# Patient Record
Sex: Female | Born: 1950 | Race: White | Hispanic: No | Marital: Married | Smoking: Never smoker
Health system: Southern US, Community
[De-identification: ages and names within clinical notes are randomized; demographics above are authoritative.]

## PROBLEM LIST (undated history)

## (undated) DIAGNOSIS — I5189 Other ill-defined heart diseases: Secondary | ICD-10-CM

## (undated) DIAGNOSIS — K209 Esophagitis, unspecified without bleeding: Secondary | ICD-10-CM

## (undated) DIAGNOSIS — M5416 Radiculopathy, lumbar region: Secondary | ICD-10-CM

## (undated) DIAGNOSIS — K449 Diaphragmatic hernia without obstruction or gangrene: Secondary | ICD-10-CM

## (undated) DIAGNOSIS — D649 Anemia, unspecified: Secondary | ICD-10-CM

## (undated) DIAGNOSIS — I447 Left bundle-branch block, unspecified: Secondary | ICD-10-CM

## (undated) DIAGNOSIS — I38 Endocarditis, valve unspecified: Secondary | ICD-10-CM

## (undated) DIAGNOSIS — N184 Chronic kidney disease, stage 4 (severe): Secondary | ICD-10-CM

## (undated) DIAGNOSIS — M8580 Other specified disorders of bone density and structure, unspecified site: Secondary | ICD-10-CM

## (undated) DIAGNOSIS — N189 Chronic kidney disease, unspecified: Secondary | ICD-10-CM

## (undated) DIAGNOSIS — I2510 Atherosclerotic heart disease of native coronary artery without angina pectoris: Secondary | ICD-10-CM

## (undated) DIAGNOSIS — I2721 Secondary pulmonary arterial hypertension: Secondary | ICD-10-CM

## (undated) DIAGNOSIS — M1990 Unspecified osteoarthritis, unspecified site: Secondary | ICD-10-CM

## (undated) DIAGNOSIS — U071 COVID-19: Secondary | ICD-10-CM

## (undated) DIAGNOSIS — J189 Pneumonia, unspecified organism: Secondary | ICD-10-CM

## (undated) DIAGNOSIS — I509 Heart failure, unspecified: Secondary | ICD-10-CM

## (undated) DIAGNOSIS — E039 Hypothyroidism, unspecified: Secondary | ICD-10-CM

## (undated) DIAGNOSIS — I10 Essential (primary) hypertension: Secondary | ICD-10-CM

## (undated) DIAGNOSIS — I5032 Chronic diastolic (congestive) heart failure: Secondary | ICD-10-CM

## (undated) DIAGNOSIS — E119 Type 2 diabetes mellitus without complications: Secondary | ICD-10-CM

## (undated) DIAGNOSIS — K5790 Diverticulosis of intestine, part unspecified, without perforation or abscess without bleeding: Secondary | ICD-10-CM

## (undated) DIAGNOSIS — K219 Gastro-esophageal reflux disease without esophagitis: Secondary | ICD-10-CM

## (undated) DIAGNOSIS — E7800 Pure hypercholesterolemia, unspecified: Secondary | ICD-10-CM

## (undated) DIAGNOSIS — E079 Disorder of thyroid, unspecified: Secondary | ICD-10-CM

## (undated) DIAGNOSIS — E669 Obesity, unspecified: Secondary | ICD-10-CM

## (undated) DIAGNOSIS — I4891 Unspecified atrial fibrillation: Secondary | ICD-10-CM

## (undated) HISTORY — PX: OTHER SURGICAL HISTORY: SHX169

## (undated) HISTORY — PX: CATARACT EXTRACTION: SUR2

## (undated) HISTORY — DX: Diaphragmatic hernia without obstruction or gangrene: K44.9

## (undated) HISTORY — DX: Diverticulosis of intestine, part unspecified, without perforation or abscess without bleeding: K57.90

## (undated) HISTORY — DX: Other specified disorders of bone density and structure, unspecified site: M85.80

## (undated) HISTORY — DX: Unspecified osteoarthritis, unspecified site: M19.90

## (undated) HISTORY — DX: Hypercalcemia: E83.52

## (undated) HISTORY — DX: Esophagitis, unspecified: K20.9

## (undated) HISTORY — DX: Heart failure, unspecified: I50.9

## (undated) HISTORY — DX: Obesity, unspecified: E66.9

## (undated) HISTORY — PX: EYE SURGERY: SHX253

## (undated) HISTORY — DX: Esophagitis, unspecified without bleeding: K20.90

## (undated) HISTORY — DX: Radiculopathy, lumbar region: M54.16

---
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
## 2004-09-15 ENCOUNTER — Ambulatory Visit: Payer: Self-pay | Admitting: Internal Medicine

## 2004-12-28 ENCOUNTER — Ambulatory Visit: Payer: Self-pay | Admitting: Cardiovascular Disease

## 2006-12-05 ENCOUNTER — Inpatient Hospital Stay: Payer: Self-pay | Admitting: Cardiovascular Disease

## 2006-12-05 ENCOUNTER — Other Ambulatory Visit: Payer: Self-pay

## 2006-12-05 HISTORY — PX: CORONARY ANGIOPLASTY WITH STENT PLACEMENT: SHX49

## 2009-08-24 ENCOUNTER — Ambulatory Visit: Payer: Self-pay | Admitting: Family Medicine

## 2010-11-07 ENCOUNTER — Ambulatory Visit: Payer: Self-pay | Admitting: Cardiovascular Disease

## 2010-12-13 ENCOUNTER — Inpatient Hospital Stay: Payer: Self-pay | Admitting: Student

## 2012-09-18 ENCOUNTER — Ambulatory Visit: Payer: Self-pay

## 2012-09-29 ENCOUNTER — Other Ambulatory Visit: Payer: Self-pay | Admitting: Urgent Care

## 2012-09-29 LAB — IRON: Iron: 52 ug/dL (ref 50–170)

## 2012-09-29 LAB — FERRITIN: Ferritin (ARMC): 8 ng/mL (ref 8–388)

## 2012-09-29 LAB — FOLATE: Folic Acid: 18.4 ng/mL (ref 3.1–100.0)

## 2012-10-01 ENCOUNTER — Ambulatory Visit: Payer: Self-pay

## 2012-10-20 ENCOUNTER — Ambulatory Visit: Payer: Self-pay | Admitting: Gastroenterology

## 2012-10-21 LAB — PATHOLOGY REPORT

## 2014-07-26 ENCOUNTER — Other Ambulatory Visit: Payer: Self-pay | Admitting: Unknown Physician Specialty

## 2014-08-07 ENCOUNTER — Emergency Department: Payer: PRIVATE HEALTH INSURANCE

## 2014-08-07 ENCOUNTER — Encounter: Payer: Self-pay | Admitting: *Deleted

## 2014-08-07 ENCOUNTER — Inpatient Hospital Stay
Admission: EM | Admit: 2014-08-07 | Discharge: 2014-08-10 | DRG: 293 | Disposition: A | Discharge status: Home / Self Care | Payer: PRIVATE HEALTH INSURANCE | Attending: Specialist | Admitting: Specialist

## 2014-08-07 DIAGNOSIS — E039 Hypothyroidism, unspecified: Secondary | ICD-10-CM | POA: Diagnosis present

## 2014-08-07 DIAGNOSIS — E785 Hyperlipidemia, unspecified: Secondary | ICD-10-CM | POA: Diagnosis present

## 2014-08-07 DIAGNOSIS — Z79810 Long term (current) use of selective estrogen receptor modulators (SERMs): Secondary | ICD-10-CM | POA: Diagnosis not present

## 2014-08-07 DIAGNOSIS — Z79899 Other long term (current) drug therapy: Secondary | ICD-10-CM | POA: Diagnosis not present

## 2014-08-07 DIAGNOSIS — R0902 Hypoxemia: Secondary | ICD-10-CM | POA: Diagnosis present

## 2014-08-07 DIAGNOSIS — I2510 Atherosclerotic heart disease of native coronary artery without angina pectoris: Secondary | ICD-10-CM | POA: Diagnosis present

## 2014-08-07 DIAGNOSIS — Z882 Allergy status to sulfonamides status: Secondary | ICD-10-CM

## 2014-08-07 DIAGNOSIS — Z955 Presence of coronary angioplasty implant and graft: Secondary | ICD-10-CM

## 2014-08-07 DIAGNOSIS — Z7982 Long term (current) use of aspirin: Secondary | ICD-10-CM | POA: Diagnosis not present

## 2014-08-07 DIAGNOSIS — E119 Type 2 diabetes mellitus without complications: Secondary | ICD-10-CM | POA: Diagnosis present

## 2014-08-07 DIAGNOSIS — D649 Anemia, unspecified: Secondary | ICD-10-CM | POA: Diagnosis present

## 2014-08-07 DIAGNOSIS — I10 Essential (primary) hypertension: Secondary | ICD-10-CM | POA: Diagnosis not present

## 2014-08-07 DIAGNOSIS — I5033 Acute on chronic diastolic (congestive) heart failure: Secondary | ICD-10-CM | POA: Diagnosis not present

## 2014-08-07 DIAGNOSIS — Z7902 Long term (current) use of antithrombotics/antiplatelets: Secondary | ICD-10-CM | POA: Diagnosis not present

## 2014-08-07 DIAGNOSIS — I509 Heart failure, unspecified: Secondary | ICD-10-CM | POA: Diagnosis not present

## 2014-08-07 DIAGNOSIS — K219 Gastro-esophageal reflux disease without esophagitis: Secondary | ICD-10-CM | POA: Diagnosis present

## 2014-08-07 DIAGNOSIS — N289 Disorder of kidney and ureter, unspecified: Secondary | ICD-10-CM | POA: Diagnosis present

## 2014-08-07 HISTORY — DX: Atherosclerotic heart disease of native coronary artery without angina pectoris: I25.10

## 2014-08-07 HISTORY — DX: Type 2 diabetes mellitus without complications: E11.9

## 2014-08-07 HISTORY — DX: Pure hypercholesterolemia, unspecified: E78.00

## 2014-08-07 HISTORY — DX: Gastro-esophageal reflux disease without esophagitis: K21.9

## 2014-08-07 HISTORY — DX: Essential (primary) hypertension: I10

## 2014-08-07 HISTORY — DX: Disorder of thyroid, unspecified: E07.9

## 2014-08-07 LAB — BASIC METABOLIC PANEL
Anion gap: 7 (ref 5–15)
BUN: 13 mg/dL (ref 6–20)
CO2: 25 mmol/L (ref 22–32)
Calcium: 8.5 mg/dL — ABNORMAL LOW (ref 8.9–10.3)
Chloride: 92 mmol/L — ABNORMAL LOW (ref 101–111)
Creatinine, Ser: 0.8 mg/dL (ref 0.44–1.00)
GFR calc non Af Amer: >60 mL/min (ref >60)
Glucose, Bld: 143 mg/dL — ABNORMAL HIGH (ref 65–99)
Potassium: 4 mmol/L (ref 3.5–5.1)
Sodium: 124 mmol/L — ABNORMAL LOW (ref 135–145)

## 2014-08-07 LAB — CBC
HCT: 27.8 % — ABNORMAL LOW (ref 35.0–47.0)
HEMOGLOBIN: 9.1 g/dL — AB (ref 12.0–16.0)
MCH: 28.2 pg (ref 26.0–34.0)
MCHC: 32.7 g/dL (ref 32.0–36.0)
MCV: 86.2 fL (ref 80.0–100.0)
PLATELETS: 250 10*3/uL (ref 150–440)
RBC: 3.23 MIL/uL — ABNORMAL LOW (ref 3.80–5.20)
RDW: 18.9 % — AB (ref 11.5–14.5)
WBC: 6.5 10*3/uL (ref 3.6–11.0)

## 2014-08-07 LAB — GLUCOSE, CAPILLARY: GLUCOSE-CAPILLARY: 156 mg/dL — AB (ref 65–99)

## 2014-08-07 LAB — BRAIN NATRIURETIC PEPTIDE: B NATRIURETIC PEPTIDE 5: 516 pg/mL — AB (ref 0.0–100.0)

## 2014-08-07 LAB — TROPONIN I: Troponin I: <0.03 ng/mL (ref <0.031)

## 2014-08-07 MED ORDER — ACETAMINOPHEN 650 MG RE SUPP: 650.0000 mg | Freq: Four times a day (QID) | RECTAL | Status: DC | PRN

## 2014-08-07 MED ORDER — ATORVASTATIN CALCIUM 20 MG PO TABS
20.0000 mg | ORAL_TABLET | Freq: Every day | ORAL | Status: DC
  Administered 2014-08-08 – 2014-08-10 (×3): 20 mg via ORAL
  Filled 2014-08-07 (×3): qty 1

## 2014-08-07 MED ORDER — AMLODIPINE BESYLATE 5 MG PO TABS
5.0000 mg | ORAL_TABLET | Freq: Every day | ORAL | Status: DC
  Filled 2014-08-07: qty 1

## 2014-08-07 MED ORDER — IRBESARTAN 150 MG PO TABS
300.0000 mg | ORAL_TABLET | Freq: Every day | ORAL | Status: DC
  Administered 2014-08-08 – 2014-08-10 (×3): 300 mg via ORAL
  Filled 2014-08-07 (×3): qty 2

## 2014-08-07 MED ORDER — ONDANSETRON HCL 4 MG/2ML IJ SOLN: 4.0000 mg | Freq: Four times a day (QID) | INTRAMUSCULAR | Status: DC | PRN

## 2014-08-07 MED ORDER — SODIUM CHLORIDE 0.9 % IJ SOLN
3.0000 mL | Freq: Two times a day (BID) | INTRAMUSCULAR | Status: DC
  Administered 2014-08-08 – 2014-08-10 (×6): 3 mL via INTRAVENOUS

## 2014-08-07 MED ORDER — ONDANSETRON HCL 4 MG PO TABS: 4.0000 mg | ORAL_TABLET | Freq: Four times a day (QID) | ORAL | Status: DC | PRN

## 2014-08-07 MED ORDER — NITROGLYCERIN 2 % TD OINT
TOPICAL_OINTMENT | TRANSDERMAL | Status: AC
  Filled 2014-08-07: qty 1

## 2014-08-07 MED ORDER — ASPIRIN EC 81 MG PO TBEC
81.0000 mg | DELAYED_RELEASE_TABLET | Freq: Every day | ORAL | Status: DC
  Administered 2014-08-08 – 2014-08-10 (×3): 81 mg via ORAL
  Filled 2014-08-07 (×3): qty 1

## 2014-08-07 MED ORDER — HEPARIN SODIUM (PORCINE) 5000 UNIT/ML IJ SOLN
5000.0000 [IU] | Freq: Three times a day (TID) | INTRAMUSCULAR | Status: DC
  Administered 2014-08-08 – 2014-08-10 (×9): 5000 [IU] via SUBCUTANEOUS
  Filled 2014-08-07 (×9): qty 1

## 2014-08-07 MED ORDER — LEVOTHYROXINE SODIUM 50 MCG PO TABS
100.0000 ug | ORAL_TABLET | Freq: Every day | ORAL | Status: DC
  Administered 2014-08-08 – 2014-08-09 (×2): 100 ug via ORAL
  Filled 2014-08-07 (×2): qty 2

## 2014-08-07 MED ORDER — FUROSEMIDE 10 MG/ML IJ SOLN
40.0000 mg | Freq: Every day | INTRAMUSCULAR | Status: DC
  Filled 2014-08-07: qty 4

## 2014-08-07 MED ORDER — RALOXIFENE HCL 60 MG PO TABS
60.0000 mg | ORAL_TABLET | Freq: Every day | ORAL | Status: DC
  Administered 2014-08-08 – 2014-08-10 (×3): 60 mg via ORAL
  Filled 2014-08-07 (×3): qty 1

## 2014-08-07 MED ORDER — PANTOPRAZOLE SODIUM 40 MG PO TBEC
40.0000 mg | DELAYED_RELEASE_TABLET | Freq: Every day | ORAL | Status: DC
  Administered 2014-08-08 – 2014-08-10 (×3): 40 mg via ORAL
  Filled 2014-08-07 (×3): qty 1

## 2014-08-07 MED ORDER — CLOPIDOGREL BISULFATE 75 MG PO TABS
75.0000 mg | ORAL_TABLET | Freq: Every day | ORAL | Status: DC
  Administered 2014-08-08 – 2014-08-10 (×3): 75 mg via ORAL
  Filled 2014-08-07 (×3): qty 1

## 2014-08-07 MED ORDER — FUROSEMIDE 10 MG/ML IJ SOLN
INTRAMUSCULAR | Status: AC
  Filled 2014-08-07: qty 4

## 2014-08-07 MED ORDER — ACETAMINOPHEN 325 MG PO TABS: 650.0000 mg | ORAL_TABLET | Freq: Four times a day (QID) | ORAL | Status: DC | PRN

## 2014-08-07 MED ORDER — NITROGLYCERIN 2 % TD OINT
1.0000 [in_us] | TOPICAL_OINTMENT | Freq: Four times a day (QID) | TRANSDERMAL | Status: DC
  Administered 2014-08-07 – 2014-08-10 (×10): 1 [in_us] via TOPICAL
  Filled 2014-08-07 (×10): qty 1

## 2014-08-07 MED ORDER — MORPHINE SULFATE 2 MG/ML IJ SOLN: 2.0000 mg | INTRAMUSCULAR | Status: DC | PRN

## 2014-08-07 MED ORDER — IPRATROPIUM-ALBUTEROL 0.5-2.5 (3) MG/3ML IN SOLN
3.0000 mL | RESPIRATORY_TRACT | Status: DC
  Administered 2014-08-08 – 2014-08-10 (×12): 3 mL via RESPIRATORY_TRACT
  Filled 2014-08-07 (×12): qty 3

## 2014-08-07 MED ORDER — METOPROLOL SUCCINATE ER 100 MG PO TB24
100.0000 mg | ORAL_TABLET | Freq: Every day | ORAL | Status: DC
  Administered 2014-08-08 – 2014-08-10 (×3): 100 mg via ORAL
  Filled 2014-08-07 (×3): qty 1

## 2014-08-07 MED ORDER — FUROSEMIDE 10 MG/ML IJ SOLN
40.0000 mg | INTRAMUSCULAR | Status: AC
  Administered 2014-08-07: 40 mg via INTRAVENOUS

## 2014-08-07 MED ORDER — INSULIN ASPART 100 UNIT/ML ~~LOC~~ SOLN: 0.0000 [IU] | Freq: Three times a day (TID) | SUBCUTANEOUS | Status: DC

## 2014-08-07 MED ORDER — INSULIN ASPART 100 UNIT/ML ~~LOC~~ SOLN
0.0000 [IU] | Freq: Every day | SUBCUTANEOUS | Status: DC
  Filled 2014-08-07: qty 2

## 2014-08-07 NOTE — ED Notes (Signed)
Pt with improved resp rate, pt states "i feel a little better". Oxygen continues to infuse at 2lpm via [place], cardiac monitor remains in place. Call bell at right side. Bedside commode placed next to pt due to lasix administration.

## 2014-08-07 NOTE — ED Notes (Signed)
Pt with ra pox of 84% and resps of 30 with excertion after arriving in room from triage. Pt placed on oxygen at 2lpm via [place] with rebound of pox to 92%. Pt with improved tachypnea after oxygen placed.

## 2014-08-07 NOTE — ED Provider Notes (Signed)
[facility] Emergency Department Provider Note  ____________________________________________  Time seen: Approximately 8:45 PM  I have reviewed the triage vital signs and the nursing notes.   HISTORY  Chief Complaint Shortness of Breath    HPI Erica Larsen is a 64 y.o. female presents with shortness of breath increasing for about one week. This is associated with increased swelling in her legs. Chills reports her doctor recently took her off her fluid pill.  Patient does have a history of coronary disease including a stent. She takes aspirin Plavix for this. She denies chest pain. She denies any fevers or chills. She has not of a productive cough but has had a chronic cough for months.  Patient developed shortness of breath, worsening with exertion, is worse when she lays down, and today is notable even at rest.  Past Medical History  Diagnosis Date  . Hypertension   . Diabetes mellitus without complication   . High cholesterol   . Thyroid disease   . GERD (gastroesophageal reflux disease)   . Coronary artery disease     Patient Active Problem List   Diagnosis Date Noted  . Acute exacerbation of CHF (congestive heart failure) 08/07/2014    Past Surgical History  Procedure Laterality Date  . Coronary angioplasty with stent placement      No current outpatient prescriptions on file.  Allergies Latex and Sulfa antibiotics  Family History  Problem Relation Age of Onset  . Heart failure Neg Hx     Social History History  Substance Use Topics  . Smoking status: Never Smoker   . Smokeless tobacco: Not on file  . Alcohol Use: No    Review of Systems Constitutional: No fever/chills Eyes: No visual changes. ENT: No sore throat. Cardiovascular: Denies chest pain. Respiratory: See history of present illness Gastrointestinal: No abdominal pain.  No nausea, no vomiting.  No diarrhea.  No constipation. Genitourinary: Negative for  dysuria. Musculoskeletal: Negative for back pain. Skin: Negative for rash. Neurological: Negative for headaches, focal weakness or numbness.  10-point ROS otherwise negative.  ____________________________________________   PHYSICAL EXAM:  VITAL SIGNS: ED Triage Vitals  Enc Vitals Group     BP 08/07/14 1838 147/106 mmHg     Pulse Rate 08/07/14 1838 73     Resp 08/07/14 1838 26     Temp 08/07/14 1838 98.1 F (36.7 C)     Temp Source 08/07/14 1838 Oral     SpO2 08/07/14 1838 92 %     Weight --      Height 08/07/14 1838 5\' 2"  (1.575 m)     Head Cir --      Peak Flow --      Pain Score --      Pain Loc --      Pain Edu? --      Excl. in [place]? --     Constitutional: Alert and oriented. Mild increased work of breathing. Oxygen 99% on 2 L. Eyes: Conjunctivae are normal. PERRL. EOMI. Head: Atraumatic. Nose: No congestion/rhinnorhea. Mouth/Throat: Mucous membranes are moist.  Oropharynx non-erythematous. Neck: No stridor.   Cardiovascular: Normal rate, regular rhythm. Grossly normal heart sounds.  Good peripheral circulation. Respiratory: Normal respiratory effort.  No retractions. Diminished lung sounds at the bases. Question if there may be some slight crackles in the bases. Aches in short phrases. Mild use of accessory muscles. Gastrointestinal: Soft and nontender. No distention. No abdominal bruits. No CVA tenderness. Musculoskeletal: 2+ pitting edema bilaterally lower extremities. Neurologic:  Normal speech and language. No gross focal neurologic deficits are appreciated. Speech is normal.  Skin:  Skin is warm, dry and intact. No rash noted. Psychiatric: Mood and affect are normal. Speech and behavior are normal.  ____________________________________________   LABS (all labs ordered are listed, but only abnormal results are displayed)  Labs Reviewed  BASIC METABOLIC PANEL - Abnormal; Notable for the following:    Sodium 124 (*)    Chloride 92 (*)    Glucose, Bld 143  (*)    Calcium 8.5 (*)    All other components within normal limits  BRAIN NATRIURETIC PEPTIDE - Abnormal; Notable for the following:    B Natriuretic Peptide 516.0 (*)    All other components within normal limits  CBC - Abnormal; Notable for the following:    RBC 3.23 (*)    Hemoglobin 9.1 (*)    HCT 27.8 (*)    RDW 18.9 (*)    All other components within normal limits  GLUCOSE, CAPILLARY - Abnormal; Notable for the following:    Glucose-Capillary 156 (*)    All other components within normal limits  TROPONIN I  TROPONIN I  TROPONIN I  TROPONIN I   ____________________________________________  EKG  EKG reviewed current every by me Normal sinus rhythm, appearance of possible left bundle branch block. T-wave abnormality noted in II, III, and F aVF including inversions. There is no acute ST elevation noted. PR 176 QRS 136 QTC 503  Overall, this EKG looks most like a left bundle-branch block. Normal sinus rhythm. There are T-wave inversions in lead 23 and aVF which may or may not be of pathologic significance. No ST elevation ____________________________________________  RADIOLOGY  DG Chest Port 1 View (Final result) Result time: 08/07/14 20:02:21   Final result by Rad Results In Interface (08/07/14 20:02:21)   Narrative:   CLINICAL DATA: Progressive shortness of breath  EXAM: PORTABLE CHEST - 1 VIEW  COMPARISON: 12/13/2010  FINDINGS: Cardiomegaly with small left pleural effusion, diffuse interstitial opacity and vascular pedicle widening. Lung volumes are low.  IMPRESSION: CHF pattern, including small left pleural effusion.    ____________________________________________   PROCEDURES  Procedure(s) performed: None  Critical Care performed: No  ____________________________________________   INITIAL IMPRESSION / ASSESSMENT AND PLAN / ED COURSE  Pertinent labs & imaging results that were available during my care of the patient were reviewed by me and  considered in my medical decision making (see chart for details).  Patient presents with dyspnea. Associated with increasing lower extremity edema bilaterally. Recently stopped her diuretic. Differential diagnosis includes congestive heart failure, pneumonia, reactive airway disease, acute coronary syndrome. No evidence to support acute pulmonary embolus and, dissection and I do not feel this is likely due to pulmonary hypertension. Based on her clinical history, appears a congestive heart failure is most likely.  Review of chest x-ray and labs as well as clinical history revealed that this is likely acute congestive heart failure. Patient does not carry a known diagnosis of.  Plan to give her Lasix, Nitropaste and will admit her to the hospital service for further workup and evaluation due to her hypoxia requiring oxygen. ____________________________________________   FINAL CLINICAL IMPRESSION(S) / ED DIAGNOSES  Final diagnoses:  Acute exacerbation of CHF (congestive heart failure)      Delman Kitten, MD 08/07/14 2352

## 2014-08-07 NOTE — ED Notes (Signed)
Pt reports improved shob.

## 2014-08-07 NOTE — H&P (Signed)
[place] [facility] [place] NAME: Erica Larsen    MR#:  [id number]  DATE OF BIRTH:  08-13-1950   DATE OF ADMISSION:  08/07/2014  PRIMARY CARE PHYSICIAN: Golden Pop, MD  Cardiologist: Humphrey Rolls  REQUESTING/REFERRING PHYSICIAN: Jacqualine Code  CHIEF COMPLAINT:   Chief Complaint  Patient presents with  . Shortness of Breath    HISTORY OF PRESENT ILLNESS:  Erica Larsen  is a 64 y.o. female with a known history of coronary artery disease status post PCI and stent placement, type 2 diabetes non-insulin-requiring presenting with shortness of breath.  She describes approximately two-week duration of shortness of breath mainly dyspnea on exertion however and progressing to shortness breath at rest. With associated orthopnea, lower extremity edema, PND. She however denies any fevers, chills, cough, chest pain. On emergency department arrival she was noted be hypoxic saturations into the mid 80s on room air requiring supplemental O2 to keep SaO2 greater than 92%.  PAST MEDICAL HISTORY:   Past Medical History  Diagnosis Date  . Hypertension   . Diabetes mellitus without complication   . High cholesterol   . Thyroid disease   . GERD (gastroesophageal reflux disease)   . Coronary artery disease     PAST SURGICAL HISTORY:   Past Surgical History  Procedure Laterality Date  . Coronary angioplasty with stent placement      SOCIAL HISTORY:   History  Substance Use Topics  . Smoking status: Never Smoker   . Smokeless tobacco: Not on file  . Alcohol Use: No    FAMILY HISTORY:   Family History  Problem Relation Age of Onset  . Heart failure Neg Hx     DRUG ALLERGIES:   Allergies  Allergen Reactions  . Latex   . Sulfa Antibiotics     REVIEW OF SYSTEMS:  REVIEW OF SYSTEMS:  CONSTITUTIONAL: Denies fevers, chills, fatigue, weakness.  EYES: Denies blurred vision, double vision, or eye pain.  EARS, NOSE, THROAT: Denies tinnitus, ear  pain, hearing loss.  RESPIRATORY: denies cough,  positive shortness of breath, denies  wheezing  CARDIOVASCULAR: Denies chest pain, palpitations,  positive edema, orthopnea, PND   GASTROINTESTINAL: Denies nausea, vomiting, diarrhea, abdominal pain.  GENITOURINARY: Denies dysuria, hematuria.  ENDOCRINE: Denies nocturia or thyroid problems. HEMATOLOGIC AND LYMPHATIC: Denies easy bruising or bleeding.  SKIN: Denies rash or lesions.  MUSCULOSKELETAL: Denies pain in neck, back, shoulder, knees, hips, or further arthritic symptoms.  NEUROLOGIC: Denies paralysis, paresthesias.  PSYCHIATRIC: Denies anxiety or depressive symptoms. Otherwise full review of systems performed by me is negative.   MEDICATIONS AT HOME:   Prior to Admission medications   Medication Sig Start Date End Date Taking? Authorizing Provider  amLODipine (NORVASC) 5 MG tablet Take 5 mg by mouth every morning.    Yes Historical Provider, MD  aspirin 81 MG tablet Take 81 mg by mouth every morning.    Yes Historical Provider, MD  atorvastatin (LIPITOR) 20 MG tablet Take 20 mg by mouth every morning.    Yes Historical Provider, MD  clopidogrel (PLAVIX) 75 MG tablet Take 75 mg by mouth every morning.    Yes Historical Provider, MD  lansoprazole (PREVACID) 30 MG capsule TAKE ONE CAPSULE BY MOUTH EVERY DAY 07/26/14  Yes Kathrine Haddock, NP  levothyroxine (SYNTHROID, LEVOTHROID) 100 MCG tablet Take 100 mcg by mouth daily before breakfast.   Yes Historical Provider, MD  metoprolol succinate (TOPROL-XL) 100 MG 24 hr tablet Take 100 mg by mouth every morning.  Take with or immediately following a meal.   Yes Historical Provider, MD  olmesartan (BENICAR) 40 MG tablet Take 40 mg by mouth every morning.    Yes Historical Provider, MD  pioglitazone (ACTOS) 30 MG tablet Take 30 mg by mouth every morning.    Yes Historical Provider, MD  raloxifene (EVISTA) 60 MG tablet Take 60 mg by mouth every morning.    Yes Historical Provider, MD      VITAL  SIGNS:  Blood pressure 165/85, pulse 59, temperature 98.1 F (36.7 C), temperature source Oral, resp. rate 18, height 5\' 2"  (1.575 m), SpO2 98 %.  PHYSICAL EXAMINATION:  VITAL SIGNS: Filed Vitals:   08/07/14 2215  BP:   Pulse: 59  Temp:   Resp: 69   GENERAL:64 y.o.female currently in no acute distress.  HEAD: Normocephalic, atraumatic.  EYES: Pupils equal, round, reactive to light. Extraocular muscles intact. No scleral icterus.  MOUTH: Moist mucosal membrane. Dentition intact. No abscess noted.  EAR, NOSE, THROAT: Clear without exudates. No external lesions.  NECK: Supple. No thyromegaly. No nodules. No Appreciable JVD.  PULDiminished breath sounds throughout all lung fields, bibasilar crackles without  wheeze rails or rhonci. No use of accessory muscles, Good respiratory effpoorr entry bilaterally CHEST: Nontender to palpation.  CARDIOVASCULAR: S1 and S2. Regular rate and rhythm. No murmurs, rubs, or gal2+ edema. Pedal pulses 2+ bilaterally.  GASTROINTESTINAL: Soft, nontender, nondistended. No masses. Positive bowel sounds. No hepatosplenomegaly.  MUSCULOSKELETAL: No swelling, clubbing, or edema. Range of motion full in all extremities.  NEUROLOGIC: Cranial nerves II through XII are intact. No gross focal neurological deficits. Sensation intact. Reflexes intact.  SKIN: No ulceration, lesions, rashes, or cyanosis. Skin warm and dry. Turgor intact.  PSYCHIATRIC: Mood, affect within normal limits. The patient is awake, alert and oriented x 3. Insight, judgment intact.    LABORATORY PANEL:   CBC  Recent Labs Lab 08/07/14 1955  WBC 6.5  HGB 9.1*  HCT 27.8*  PLT 250   ------------------------------------------------------------------------------------------------------------------  Chemistries   Recent Labs Lab 08/07/14 1955  NA 124*  K 4.0  CL 92*  CO2 25  GLUCOSE 143*  BUN 13  CREATININE 0.80  CALCIUM 8.5*    ------------------------------------------------------------------------------------------------------------------  Cardiac Enzymes  Recent Labs Lab 08/07/14 1955  TROPONINI <0.03   ------------------------------------------------------------------------------------------------------------------  RADIOLOGY:  Dg Chest Port 1 View  08/07/2014   CLINICAL DATA:  Progressive shortness of breath  EXAM: PORTABLE CHEST - 1 VIEW  COMPARISON:  12/13/2010  FINDINGS: Cardiomegaly with small left pleural effusion, diffuse interstitial opacity and vascular pedicle widening. Lung volumes are low.  IMPRESSION: CHF pattern, including small left pleural effusion.   Electronically Signed   By: Monte Fantasia M.D.   On: 08/07/2014 20:02    EKG:   Orders placed or performed during the hospital encounter of 08/07/14  . ED EKG (<52mins upon arrival to the ED)  . ED EKG (<29mins upon arrival to the ED)    IMPRESSION AND PLAN:    64 year old Caucasian female history of coronary artery disease status post PCI and stent placement presenting with shortness of breath.  1. Acute congestive heart failure exacerbation: Unspecified systolic versus diastolic. Place on telemetry, trend cardiac enzymes 3, consult cardiology Dr. Humphrey Rolls as she follows with him. Follow daily weights, ins and outs, DuoNeb treatments, supplemental O2, diuresis with Lasix 40 mg daily, check echocardiogram  2. Type 2 diabetes uncomplicated: Hold oral agents at insulin sliding scale with every 6 hours Accu-Cheks 3. Hypothyroidism, unspecified: Synthroid 4.  Hypertension essential: Norvasc, Toprol-XL, Benicar 5. Hyperlipidemia unspecified: Lipitor 6. Venous thromboembolism prophylactic: Heparin subcutaneous    All the records are reviewed and case discussed with ED provider. Management plans discussed with the patient, family and they are in agreement.  CODE [facility] FULL  TOTAL TIME TAKING CARE OF THIS PATIENT: 45 minutes.     Asbury Hair,  Karenann Cai.D on 08/07/2014 at 10:30 PM  Between 7am to 6pm - Pager - [phone]  After 6pm: House Pager: - [facility]  Office  [phone]  CC: Primary care physician; Golden Pop, MD

## 2014-08-07 NOTE — ED Notes (Signed)
Increasing sob over the past couple of weeks. Pt appears sob in triage.  Pt denies any URI or fever or CP with this

## 2014-08-08 ENCOUNTER — Inpatient Hospital Stay
Admit: 2014-08-08 | Discharge: 2014-08-08 | Disposition: A | Discharge status: Home / Self Care | Payer: PRIVATE HEALTH INSURANCE | Attending: Internal Medicine | Admitting: Internal Medicine

## 2014-08-08 LAB — TROPONIN I
Troponin I: <0.03 ng/mL (ref <0.031)
Troponin I: <0.03 ng/mL (ref <0.031)

## 2014-08-08 LAB — GLUCOSE, CAPILLARY
GLUCOSE-CAPILLARY: 112 mg/dL — AB (ref 65–99)
GLUCOSE-CAPILLARY: 130 mg/dL — AB (ref 65–99)
GLUCOSE-CAPILLARY: 137 mg/dL — AB (ref 65–99)
Glucose-Capillary: 141 mg/dL — ABNORMAL HIGH (ref 65–99)

## 2014-08-08 MED ORDER — GUAIFENESIN ER 600 MG PO TB12
600.0000 mg | ORAL_TABLET | Freq: Two times a day (BID) | ORAL | Status: DC
  Administered 2014-08-08 – 2014-08-10 (×5): 600 mg via ORAL
  Filled 2014-08-08 (×5): qty 1

## 2014-08-08 MED ORDER — FUROSEMIDE 10 MG/ML IJ SOLN
40.0000 mg | Freq: Two times a day (BID) | INTRAMUSCULAR | Status: DC
  Administered 2014-08-08 – 2014-08-09 (×3): 40 mg via INTRAVENOUS
  Filled 2014-08-08 (×3): qty 4

## 2014-08-08 NOTE — Care Management (Addendum)
Presents from home where she resides with her husband.  Experiencing gradual increase in shortness of breath.  Cardiology indicates that patient's PCP stopped patient's lasix due to renal insuffiiency When presented to ED room air sats in the mid 80's. Denies chronic home 02.  Independent in all adls. Denies issues accessing medical care and meds.  May benefit from CHF Clinic referral.  Discussed need to wean 02 and or access for home 02 need during progressoin.

## 2014-08-08 NOTE — Progress Notes (Signed)
[place] at [facility]                                                                                                                                                                                            Patient Demographics   Erica Larsen, is a 64 y.o. female, DOB - [birth year], [id number]  Admit date - 08/07/2014   Admitting Physician Lytle Butte, MD  Outpatient Primary MD for the patient is Golden Pop, MD   LOS - 1  Subjective: Patient admitted with shortness of breath states that little bit better still coughing. Denies any chest pain     Review of Systems:   CONSTITUTIONAL: No documented fever. No fatigue, weakness. No weight gain, no weight loss.  EYES: No blurry or double vision.  ENT: No tinnitus. No postnasal drip. No redness of the oropharynx.  RESPIRATORY: Positive cough, no wheeze, no hemoptysis. No dyspnea.  CARDIOVASCULAR: No chest pain. No orthopnea. No palpitations. No syncope.  GASTROINTESTINAL: No nausea, no vomiting or diarrhea. No abdominal pain. No melena or hematochezia.  GENITOURINARY: No dysuria or hematuria.  ENDOCRINE: No polyuria or nocturia. No heat or cold intolerance.  HEMATOLOGY: No anemia. No bruising. No bleeding.  INTEGUMENTARY: No rashes. No lesions.  MUSCULOSKELETAL: No arthritis. No swelling. No gout.  NEUROLOGIC: No numbness, tingling, or ataxia. No seizure-type activity.  PSYCHIATRIC: No anxiety. No insomnia. No ADD.    Vitals:   Filed Vitals:   08/08/14 [id number] 08/08/14 0813 08/08/14 1111 08/08/14 1148  BP: 161/71   149/70  Pulse: 71   63  Temp: 98 F (36.7 C)   97.8 F (36.6 C)  TempSrc: Oral   Oral  Resp:    20  Height:      Weight:      SpO2: 96% 97% 97% 98%    Wt Readings from Last 3 Encounters:  08/08/14 103.012 kg (227 lb 1.6 oz)     Intake/Output Summary (Last 24 hours) at 08/08/14 1203 Last data filed at 08/08/14 0944  Gross per 24 hour  Intake    240 ml  Output    1750 ml  Net  -1510 ml    Physical Exam:   GENERAL: Pleasant-appearing in no apparent distress.  HEAD, EYES, EARS, NOSE AND THROAT: Atraumatic, normocephalic. Extraocular muscles are intact. Pupils equal and reactive to light. Sclerae anicteric. No conjunctival injection. No oro-pharyngeal erythema.  NECK: Supple. There is no jugular venous distention. No bruits, no lymphadenopathy, no thyromegaly.  HEART: Regular rate and rhythm, tachycardic. No murmurs, no rubs, no clicks.  LUNGS: Crackles bilaterally. No rales or rhonchi.  No wheezes.  ABDOMEN: Soft, flat, nontender, nondistended. Has good bowel sounds. No hepatosplenomegaly appreciated.  EXTREMITIES: No evidence of any cyanosis, clubbing, or peripheral edema.  +2 pedal and radial pulses bilaterally.  NEUROLOGIC: The patient is alert, awake, and oriented x3 with no focal motor or sensory deficits appreciated bilaterally.  SKIN: Moist and warm with no rashes appreciated.  Psych: Not anxious, depressed LN: No inguinal LN enlargement    Antibiotics   Anti-infectives    None      Medications   Scheduled Meds: . aspirin EC  81 mg Oral Daily  . atorvastatin  20 mg Oral Daily  . clopidogrel  75 mg Oral Daily  . furosemide  40 mg Intravenous Q12H  . heparin  5,000 Units Subcutaneous 3 times per day  . insulin aspart  0-15 Units Subcutaneous TID WC  . insulin aspart  0-5 Units Subcutaneous QHS  . ipratropium-albuterol  3 mL Nebulization Q4H  . irbesartan  300 mg Oral Daily  . levothyroxine  100 mcg Oral QAC breakfast  . metoprolol succinate  100 mg Oral Daily  . nitroGLYCERIN  1 inch Topical 4 times per day  . pantoprazole  40 mg Oral Daily  . raloxifene  60 mg Oral Daily  . sodium chloride  3 mL Intravenous Q12H   Continuous Infusions:  PRN Meds:.acetaminophen **OR** acetaminophen, morphine injection, ondansetron **OR** ondansetron (ZOFRAN) IV   Data Review:   Micro Results No results found for this or any previous visit  (from the past 240 hour(s)).  Radiology Reports Dg Chest Port 1 View  08/07/2014   CLINICAL DATA:  Progressive shortness of breath  EXAM: PORTABLE CHEST - 1 VIEW  COMPARISON:  12/13/2010  FINDINGS: Cardiomegaly with small left pleural effusion, diffuse interstitial opacity and vascular pedicle widening. Lung volumes are low.  IMPRESSION: CHF pattern, including small left pleural effusion.   Electronically Signed   By: Monte Fantasia M.D.   On: 08/07/2014 20:02     CBC  Recent Labs Lab 08/07/14 1955  WBC 6.5  HGB 9.1*  HCT 27.8*  PLT 250  MCV 86.2  MCH 28.2  MCHC 32.7  RDW 18.9*    Chemistries   Recent Labs Lab 08/07/14 1955  NA 124*  K 4.0  CL 92*  CO2 25  GLUCOSE 143*  BUN 13  CREATININE 0.80  CALCIUM 8.5*   ------------------------------------------------------------------------------------------------------------------ estimated creatinine clearance is 77.9 mL/min (by C-G formula based on Cr of 0.8). ------------------------------------------------------------------------------------------------------------------ No results for input(s): HGBA1C in the last 72 hours. ------------------------------------------------------------------------------------------------------------------ No results for input(s): CHOL, HDL, LDLCALC, TRIG, CHOLHDL, LDLDIRECT in the last 72 hours. ------------------------------------------------------------------------------------------------------------------ No results for input(s): TSH, T4TOTAL, T3FREE, THYROIDAB in the last 72 hours.  Invalid input(s): FREET3 ------------------------------------------------------------------------------------------------------------------ No results for input(s): VITAMINB12, FOLATE, FERRITIN, TIBC, IRON, RETICCTPCT in the last 72 hours.  Coagulation profile No results for input(s): INR, PROTIME in the last 168 hours.  No results for input(s): DDIMER in the last 72 hours.  Cardiac Enzymes  Recent  Labs Lab 08/08/14 0228 08/08/14 0442 08/08/14 1101  TROPONINI <0.03 <0.03 <0.03   ------------------------------------------------------------------------------------------------------------------ Invalid input(s): POCBNP    Assessment & Plan   64 year old Caucasian female history of coronary artery disease status post PCI and stent placement presenting with shortness of breath.  1. Acute congestive heart failure exacerbation: Likely diastolic, await echocardiogram of the heart. Appreciate cardiology input continue IV Lasix,  2. Type 2 diabetes uncomplicated:  sliding scale insulin  with every 6 hours Accu-Cheks 3. Hypothyroidism,  unspecified: Synthroid 4. Hypertension essential: Norvasc, Toprol-XL, Benicar 5. Hyperlipidemia unspecified: continue  Lipitor 6. Venous thromboembolism prophylactic: Heparin subcutaneous     Code Status Orders        Start     Ordered   08/07/14 2206  Full code   Continuous     08/07/14 2206           Consults cardiology   DVT Prophylaxis  heparin  Lab Results  Component Value Date   PLT 250 08/07/2014     Time Spent in minutes   31min     Dustin Flock M.D on 08/08/2014 at 12:03 PM  Between 7am to 6pm - Pager - [phone]  After 6pm go to [email or web address] - password [email or web address] [facility]   Office  [phone]
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
## 2014-08-08 NOTE — Progress Notes (Signed)
Erica Larsen is a 64 y.o. female  [id number]  Primary Cardiologist: Neoma Laming Reason for Consultation:  CHF  HPI: This is a 64 year old white female with a past medical history of coronary artery disease status post PCI and stenting presented to the emergency room with shortness of breath orthopnea and leg swelling. She continues to be very short of breath associated with some tightness in the chest and paroxysmal nocturnal nocturnal dyspnea. She says Dr. Jeananne Rama stop the Lasix and referred her to a nephrologist because of renal insufficiency.   Review of Systems: Review of Systems  Respiratory: Positive for cough and shortness of breath.   Cardiovascular: Positive for orthopnea, leg swelling and PND.  All other systems reviewed and are negative.     Past Medical History  Diagnosis Date  . Hypertension   . Diabetes mellitus without complication   . High cholesterol   . Thyroid disease   . GERD (gastroesophageal reflux disease)   . Coronary artery disease     Medications Prior to Admission  Medication Sig Dispense Refill  . amLODipine (NORVASC) 5 MG tablet Take 5 mg by mouth every morning.     [name] aspirin 81 MG tablet Take 81 mg by mouth every morning.     [name] atorvastatin (LIPITOR) 20 MG tablet Take 20 mg by mouth every morning.     . clopidogrel (PLAVIX) 75 MG tablet Take 75 mg by mouth every morning.     . lansoprazole (PREVACID) 30 MG capsule TAKE ONE CAPSULE BY MOUTH EVERY DAY 30 capsule 5  . levothyroxine (SYNTHROID, LEVOTHROID) 100 MCG tablet Take 100 mcg by mouth daily before breakfast.    . metoprolol succinate (TOPROL-XL) 100 MG 24 hr tablet Take 100 mg by mouth every morning. Take with or immediately following a meal.    . olmesartan (BENICAR) 40 MG tablet Take 40 mg by mouth every morning.     . pioglitazone (ACTOS) 30 MG tablet Take 30 mg by mouth every morning.     . raloxifene (EVISTA) 60 MG tablet Take 60 mg by mouth every morning.        [name] amLODipine  5  mg Oral Daily  . aspirin EC  81 mg Oral Daily  . atorvastatin  20 mg Oral Daily  . clopidogrel  75 mg Oral Daily  . furosemide  40 mg Intravenous Daily  . heparin  5,000 Units Subcutaneous 3 times per day  . insulin aspart  0-15 Units Subcutaneous TID WC  . insulin aspart  0-5 Units Subcutaneous QHS  . ipratropium-albuterol  3 mL Nebulization Q4H  . irbesartan  300 mg Oral Daily  . levothyroxine  100 mcg Oral QAC breakfast  . metoprolol succinate  100 mg Oral Daily  . nitroGLYCERIN  1 inch Topical 4 times per day  . pantoprazole  40 mg Oral Daily  . raloxifene  60 mg Oral Daily  . sodium chloride  3 mL Intravenous Q12H    Infusions:    Allergies  Allergen Reactions  . Latex   . Sulfa Antibiotics     History   Social History  . Marital Status: Married    Spouse Name: N/A  . Number of Children: N/A  . Years of Education: N/A   Occupational History  . Not on file.   Social History Main Topics  . Smoking status: Never Smoker   . Smokeless tobacco: Not on file  . Alcohol Use: No  . Drug Use: Not on  file  . Sexual Activity: Not on file   Other Topics Concern  . Not on file   Social History Narrative  . No narrative on file    Family History  Problem Relation Age of Onset  . Heart failure Neg Hx     PHYSICAL EXAM: Filed Vitals:   08/08/14 0811  BP: 161/71  Pulse: 71  Temp: 98 F (36.7 C)  Resp:      Intake/Output Summary (Last 24 hours) at 08/08/14 0904 Last data filed at 08/08/14 0428  Gross per 24 hour  Intake      0 ml  Output   1550 ml  Net  -1550 ml    General:  Well appearing. No respiratory difficulty HEENT: normal Neck: supple. no JVD. Carotids 2+ bilat; no bruits. No lymphadenopathy or thryomegaly appreciated. Cor: PMI nondisplaced. Regular rate & rhythm. No rubs, gallops or murmurs. Lungs: clear Abdomen: soft, nontender, nondistended. No hepatosplenomegaly. No bruits or masses. Good bowel sounds. Extremities: no cyanosis, clubbing,  rash, edema Neuro: alert & oriented x 3, cranial nerves grossly intact. moves all 4 extremities w/o difficulty. Affect pleasant.  ECG: EKG showed normal sinus rhythm 65 bpm intervals and nonspecific interventricular conduction delay.  Results for orders placed or performed during the hospital encounter of 08/07/14 (from the past 24 hour(s))  Basic metabolic panel     Status: Abnormal   Collection Time: 08/07/14  7:55 PM  Result Value Ref Range   Sodium 124 (L) 135 - 145 mmol/L   Potassium 4.0 3.5 - 5.1 mmol/L   Chloride 92 (L) 101 - 111 mmol/L   CO2 25 22 - 32 mmol/L   Glucose, Bld 143 (H) 65 - 99 mg/dL   BUN 13 6 - 20 mg/dL   Creatinine, Ser 0.80 0.44 - 1.00 mg/dL   Calcium 8.5 (L) 8.9 - 10.3 mg/dL   GFR calc non Af Amer >60 >60 mL/min   GFR calc Af Amer >60 >60 mL/min   Anion gap 7 5 - 15  BNP (order ONLY if patient complains of dyspnea/SOB AND you have documented it for THIS visit)     Status: Abnormal   Collection Time: 08/07/14  7:55 PM  Result Value Ref Range   B Natriuretic Peptide 516.0 (H) 0.0 - 100.0 pg/mL  Troponin I     Status: None   Collection Time: 08/07/14  7:55 PM  Result Value Ref Range   Troponin I <0.03 <0.031 ng/mL  CBC     Status: Abnormal   Collection Time: 08/07/14  7:55 PM  Result Value Ref Range   WBC 6.5 3.6 - 11.0 K/uL   RBC 3.23 (L) 3.80 - 5.20 MIL/uL   Hemoglobin 9.1 (L) 12.0 - 16.0 g/dL   HCT 27.8 (L) 35.0 - 47.0 %   MCV 86.2 80.0 - 100.0 fL   MCH 28.2 26.0 - 34.0 pg   MCHC 32.7 32.0 - 36.0 g/dL   RDW 18.9 (H) 11.5 - 14.5 %   Platelets 250 150 - 440 K/uL  Glucose, capillary     Status: Abnormal   Collection Time: 08/07/14 11:27 PM  Result Value Ref Range   Glucose-Capillary 156 (H) 65 - 99 mg/dL  Troponin I (q 6hr x 3)     Status: None   Collection Time: 08/08/14  2:28 AM  Result Value Ref Range   Troponin I <0.03 <0.031 ng/mL  Troponin I (q 6hr x 3)     Status: None   Collection Time:  08/08/14  4:42 AM  Result Value Ref Range    Troponin I <0.03 <0.031 ng/mL  Glucose, capillary     Status: Abnormal   Collection Time: 08/08/14  7:38 AM  Result Value Ref Range   Glucose-Capillary 112 (H) 65 - 99 mg/dL   Dg Chest Port 1 View  08/07/2014   CLINICAL DATA:  Progressive shortness of breath  EXAM: PORTABLE CHEST - 1 VIEW  COMPARISON:  12/13/2010  FINDINGS: Cardiomegaly with small left pleural effusion, diffuse interstitial opacity and vascular pedicle widening. Lung volumes are low.  IMPRESSION: CHF pattern, including small left pleural effusion.   Electronically Signed   By: Monte Fantasia M.D.   On: 08/07/2014 20:02     ASSESSMENT AND PLAN: Congestive heart failure most likely secondary to diastolic dysfunction but must rule out LV dysfunction by doing an echocardiogram. Advise giving diuretics gingerly as patient had renal insufficiency earlier also we will do rule out coronary artery disease by doing a Lexiscan Myoview in the office after discharge. We will give further recommendations based on findings on echocardiogram. Patient has history of known coronary artery disease with history of PCI and stenting thus will look at ischemia by doing a Lexiscan Myoview as an outpatient.  Kennedee Kitzmiller A

## 2014-08-08 NOTE — Progress Notes (Signed)
*  PRELIMINARY RESULTS* Echocardiogram 2D Echocardiogram has been performed.  Erica Larsen 08/08/2014, 3:15 PM

## 2014-08-09 LAB — CBC WITH DIFFERENTIAL/PLATELET
BASOS ABS: 0 10*3/uL (ref 0–0.1)
Basophils Relative: 0 %
Eosinophils Absolute: 0.2 10*3/uL (ref 0–0.7)
Eosinophils Relative: 4 %
HEMATOCRIT: 26.2 % — AB (ref 35.0–47.0)
HEMOGLOBIN: 8.3 g/dL — AB (ref 12.0–16.0)
Lymphocytes Relative: 12 %
Lymphs Abs: 0.8 10*3/uL — ABNORMAL LOW (ref 1.0–3.6)
MCH: 27.4 pg (ref 26.0–34.0)
MCHC: 31.7 g/dL — ABNORMAL LOW (ref 32.0–36.0)
MCV: 86.5 fL (ref 80.0–100.0)
MONOS PCT: 15 %
Monocytes Absolute: 0.9 10*3/uL (ref 0.2–0.9)
NEUTROS ABS: 4.3 10*3/uL (ref 1.4–6.5)
Neutrophils Relative %: 69 %
Platelets: 213 10*3/uL (ref 150–440)
RBC: 3.03 MIL/uL — AB (ref 3.80–5.20)
RDW: 18.2 % — AB (ref 11.5–14.5)
WBC: 6.2 10*3/uL (ref 3.6–11.0)

## 2014-08-09 LAB — GLUCOSE, CAPILLARY
GLUCOSE-CAPILLARY: 127 mg/dL — AB (ref 65–99)
Glucose-Capillary: 100 mg/dL — ABNORMAL HIGH (ref 65–99)
Glucose-Capillary: 110 mg/dL — ABNORMAL HIGH (ref 65–99)
Glucose-Capillary: 144 mg/dL — ABNORMAL HIGH (ref 65–99)
Glucose-Capillary: 119 mg/dL — ABNORMAL HIGH (ref 65–99)

## 2014-08-09 LAB — VITAMIN B12: Vitamin B-12: 229 pg/mL (ref 180–914)

## 2014-08-09 LAB — IRON AND TIBC
IRON: 20 ug/dL — AB (ref 28–170)
SATURATION RATIOS: 4 % — AB (ref 10.4–31.8)
TIBC: 482 ug/dL — ABNORMAL HIGH (ref 250–450)
UIBC: 462 ug/dL

## 2014-08-09 LAB — FERRITIN: FERRITIN: 42 ng/mL (ref 11–307)

## 2014-08-09 LAB — FOLATE: FOLATE: 16.6 ng/mL (ref >5.9)

## 2014-08-09 MED ORDER — HYDRALAZINE HCL 25 MG PO TABS
25.0000 mg | ORAL_TABLET | Freq: Three times a day (TID) | ORAL | Status: DC
  Administered 2014-08-09 – 2014-08-10 (×5): 25 mg via ORAL
  Filled 2014-08-09 (×5): qty 1

## 2014-08-09 MED ORDER — LEVOTHYROXINE SODIUM 100 MCG PO TABS
100.0000 ug | ORAL_TABLET | Freq: Every day | ORAL | Status: DC
  Administered 2014-08-10: 100 ug via ORAL
  Filled 2014-08-09 (×3): qty 1

## 2014-08-09 MED ORDER — FUROSEMIDE 10 MG/ML IJ SOLN
20.0000 mg | Freq: Two times a day (BID) | INTRAMUSCULAR | Status: DC
  Administered 2014-08-09: 20 mg via INTRAVENOUS
  Filled 2014-08-09: qty 2

## 2014-08-09 NOTE — Progress Notes (Signed)
SUBJECTIVE: Patient is feeling much better she is no longer feeling short of breath. No orthopnea. She had a good night sleep.   Filed Vitals:   08/08/14 2000 08/09/14 0050 08/09/14 0427 08/09/14 0439  BP:   150/63   Pulse:   65   Temp:   97.7 F (36.5 C)   TempSrc:   Oral   Resp:   20   Height:      Weight:   102.195 kg (225 lb 4.8 oz)   SpO2: 96% 94% 98% 96%    Intake/Output Summary (Last 24 hours) at 08/09/14 0753 Last data filed at 08/09/14 0430  Gross per 24 hour  Intake    600 ml  Output   1800 ml  Net  -1200 ml    LABS: Basic Metabolic Panel:  Recent Labs  08/07/14 1955  NA 124*  K 4.0  CL 92*  CO2 25  GLUCOSE 143*  BUN 13  CREATININE 0.80  CALCIUM 8.5*   Liver Function Tests: No results for input(s): AST, ALT, ALKPHOS, BILITOT, PROT, ALBUMIN in the last 72 hours. No results for input(s): LIPASE, AMYLASE in the last 72 hours. CBC:  Recent Labs  08/07/14 1955  WBC 6.5  HGB 9.1*  HCT 27.8*  MCV 86.2  PLT 250   Cardiac Enzymes:  Recent Labs  08/08/14 0228 08/08/14 0442 08/08/14 1101  TROPONINI <0.03 <0.03 <0.03   BNP: Invalid input(s): POCBNP D-Dimer: No results for input(s): DDIMER in the last 72 hours. Hemoglobin A1C: No results for input(s): HGBA1C in the last 72 hours. Fasting Lipid Panel: No results for input(s): CHOL, HDL, LDLCALC, TRIG, CHOLHDL, LDLDIRECT in the last 72 hours. Thyroid Function Tests: No results for input(s): TSH, T4TOTAL, T3FREE, THYROIDAB in the last 72 hours.  Invalid input(s): FREET3 Anemia Panel: No results for input(s): VITAMINB12, FOLATE, FERRITIN, TIBC, IRON, RETICCTPCT in the last 72 hours.   PHYSICAL EXAM General: Well developed, well nourished, in no acute distress HEENT:  Normocephalic and atramatic Neck:  No JVD.  Lungs: Clear bilaterally to auscultation and percussion. Heart: HRRR . Normal S1 and S2 without gallops or murmurs.  Abdomen: Bowel sounds are positive, abdomen soft and non-tender   Msk:  Back normal, normal gait. Normal strength and tone for age. Extremities: No clubbing, cyanosis or edema.   Neuro: Alert and oriented X 3. Psych:  Good affect, responds appropriately  TELEMETRY: Monitor shows normal sinus rhythm  ASSESSMENT AND PLAN: CHF due to grade 1 diastolic dysfunction with normal left ventricle systolic function. Patient was given Lasix 40 mg IV every 12 with significant improvement in the her symptoms. Her diuretics were discontinued before and that's why she became became short of breath. Apparently she had renal insufficiency which was probably more related to prerenal azotemia and that's why diuretics were discontinued. Advise [place] restarting Lasix at 40 mg once a day as an outpatient. Also blood pressure needs to be controlled we will we will add hydralazine 25 mg twice a day and may be discharged with follow-up in the office on Monday at 10:00 thank you very much for referral  Active Problems:   Acute exacerbation of CHF (congestive heart failure)    Dionisio David, MD, [facility] 08/09/2014 7:53 AM

## 2014-08-09 NOTE — Progress Notes (Addendum)
[place] at [facility]                                                                                                                                                                                            Patient Demographics   Erica Larsen, is a 64 y.o. female, DOB - [birth year], [id number]  Admit date - 08/07/2014   Admitting Physician Lytle Butte, MD  Outpatient Primary MD for the patient is Golden Pop, MD   LOS - 2  Subjective:  States that breathing is improved but not completely normal. Denies any chest pains     Review of Systems:   CONSTITUTIONAL: No documented fever. No fatigue, weakness. No weight gain, no weight loss.  EYES: No blurry or double vision.  ENT: No tinnitus. No postnasal drip. No redness of the oropharynx.  RESPIRATORY: Positive cough, no wheeze, no hemoptysis. No dyspnea.  CARDIOVASCULAR: No chest pain. No orthopnea. No palpitations. No syncope.  GASTROINTESTINAL: No nausea, no vomiting or diarrhea. No abdominal pain. No melena or hematochezia.  GENITOURINARY: No dysuria or hematuria.  ENDOCRINE: No polyuria or nocturia. No heat or cold intolerance.  HEMATOLOGY: No anemia. No bruising. No bleeding.  INTEGUMENTARY: No rashes. No lesions.  MUSCULOSKELETAL: No arthritis. No swelling. No gout.  NEUROLOGIC: No numbness, tingling, or ataxia. No seizure-type activity.  PSYCHIATRIC: No anxiety. No insomnia. No ADD.    Vitals:   Filed Vitals:   08/09/14 0427 08/09/14 0439 08/09/14 0836 08/09/14 0937  BP: 150/63     Pulse: 65     Temp: 97.7 F (36.5 C)     TempSrc: Oral     Resp: 20     Height:      Weight: 102.195 kg (225 lb 4.8 oz)     SpO2: 98% 96% 95% 94%    Wt Readings from Last 3 Encounters:  08/09/14 102.195 kg (225 lb 4.8 oz)     Intake/Output Summary (Last 24 hours) at 08/09/14 1122 Last data filed at 08/09/14 0900  Gross per 24 hour  Intake    600 ml  Output   1200 ml  Net   -600 ml     Physical Exam:   GENERAL: Pleasant-appearing in no apparent distress.  HEAD, EYES, EARS, NOSE AND THROAT: Atraumatic, normocephalic. Extraocular muscles are intact. Pupils equal and reactive to light. Sclerae anicteric. No conjunctival injection. No oro-pharyngeal erythema.  NECK: Supple. There is no jugular venous distention. No bruits, no lymphadenopathy, no thyromegaly.  HEART: Regular rate and rhythm, tachycardic. No murmurs, no rubs, no clicks.  LUNGS: Crackles bilaterally. No rales or rhonchi.  No wheezes.  ABDOMEN: Soft, flat, nontender, nondistended. Has good bowel sounds. No hepatosplenomegaly appreciated.  EXTREMITIES: No evidence of any cyanosis, clubbing, or peripheral edema.  +2 pedal and radial pulses bilaterally.  NEUROLOGIC: The patient is alert, awake, and oriented x3 with no focal motor or sensory deficits appreciated bilaterally.  SKIN: Moist and warm with no rashes appreciated.  Psych: Not anxious, depressed LN: No inguinal LN enlargement    Antibiotics   Anti-infectives    None      Medications   Scheduled Meds: . aspirin EC  81 mg Oral Daily  . atorvastatin  20 mg Oral Daily  . clopidogrel  75 mg Oral Daily  . furosemide  40 mg Intravenous Q12H  . guaiFENesin  600 mg Oral BID  . heparin  5,000 Units Subcutaneous 3 times per day  . hydrALAZINE  25 mg Oral 3 times per day  . insulin aspart  0-15 Units Subcutaneous TID WC  . insulin aspart  0-5 Units Subcutaneous QHS  . ipratropium-albuterol  3 mL Nebulization Q4H  . irbesartan  300 mg Oral Daily  . levothyroxine  100 mcg Oral QAC breakfast  . metoprolol succinate  100 mg Oral Daily  . nitroGLYCERIN  1 inch Topical 4 times per day  . pantoprazole  40 mg Oral Daily  . raloxifene  60 mg Oral Daily  . sodium chloride  3 mL Intravenous Q12H   Continuous Infusions:  PRN Meds:.acetaminophen **OR** acetaminophen, morphine injection, ondansetron **OR** ondansetron (ZOFRAN) IV   Data Review:   Micro  Results No results found for this or any previous visit (from the past 240 hour(s)).  Radiology Reports Dg Chest Port 1 View  08/07/2014   CLINICAL DATA:  Progressive shortness of breath  EXAM: PORTABLE CHEST - 1 VIEW  COMPARISON:  12/13/2010  FINDINGS: Cardiomegaly with small left pleural effusion, diffuse interstitial opacity and vascular pedicle widening. Lung volumes are low.  IMPRESSION: CHF pattern, including small left pleural effusion.   Electronically Signed   By: Monte Fantasia M.D.   On: 08/07/2014 20:02     CBC  Recent Labs Lab 08/07/14 1955  WBC 6.5  HGB 9.1*  HCT 27.8*  PLT 250  MCV 86.2  MCH 28.2  MCHC 32.7  RDW 18.9*    Chemistries   Recent Labs Lab 08/07/14 1955  NA 124*  K 4.0  CL 92*  CO2 25  GLUCOSE 143*  BUN 13  CREATININE 0.80  CALCIUM 8.5*   ------------------------------------------------------------------------------------------------------------------ estimated creatinine clearance is 77.4 mL/min (by C-G formula based on Cr of 0.8). ------------------------------------------------------------------------------------------------------------------ No results for input(s): HGBA1C in the last 72 hours. ------------------------------------------------------------------------------------------------------------------ No results for input(s): CHOL, HDL, LDLCALC, TRIG, CHOLHDL, LDLDIRECT in the last 72 hours. ------------------------------------------------------------------------------------------------------------------ No results for input(s): TSH, T4TOTAL, T3FREE, THYROIDAB in the last 72 hours.  Invalid input(s): FREET3 ------------------------------------------------------------------------------------------------------------------ No results for input(s): VITAMINB12, FOLATE, FERRITIN, TIBC, IRON, RETICCTPCT in the last 72 hours.  Coagulation profile No results for input(s): INR, PROTIME in the last 168 hours.  No results for input(s):  DDIMER in the last 72 hours.  Cardiac Enzymes  Recent Labs Lab 08/08/14 0228 08/08/14 0442 08/08/14 1101  TROPONINI <0.03 <0.03 <0.03   ------------------------------------------------------------------------------------------------------------------ Invalid input(s): POCBNP    Assessment & Plan   64 year old Caucasian female history of coronary artery disease status post PCI and stent placement presenting with shortness of breath.  1. Acute diastolic congestive heart failure exacerbation:  Appreciate cardiology input  decrase lasix dose 20 iv bid  2. Type 2 diabetes uncomplicated:  sliding scale insulin  with every 6 hours Accu-Cheks bg stable 3. Hypothyroidism, unspecified: Synthroid 4. Hypertension essential: Norvasc, Toprol-XL, Benicar hydralazine added by cardiolgy 5. Hyperlipidemia unspecified: continue  Lipitor 6. Anemia: Check iron and ferritin B12 folate     Code Status Orders        Start     Ordered   08/07/14 2206  Full code   Continuous     08/07/14 2206           Consults cardiology   DVT Prophylaxis  heparin  Lab Results  Component Value Date   PLT 250 08/07/2014     Time Spent in minutes   37min     Dustin Flock M.D on 08/09/2014 at 11:22 AM  Between 7am to 6pm - Pager - [phone]  After 6pm go to [email or web address] - password [email or web address] [facility]   Office  [phone]

## 2014-08-09 NOTE — Care Management (Signed)
Nursing continues to wean from 02.  Patient is ambulatory and shortness of breath much improves.  Remains on IV lasix

## 2014-08-09 NOTE — Progress Notes (Signed)
Initial Nutrition Assessment    INTERVENTION:   Meals/Snacks: cater to pt preferences  NUTRITION DIAGNOSIS:  No PES statement at this time    GOAL:  Patient will meet greater than or equal to 90% of their needs   MONITOR:   (Energy Intake, Anthropometrics, Electrolyte/Renal Profile, Glucose Profile)  REASON FOR ASSESSMENT:   (Diagnosis CHF)    ASSESSMENT:  Pt admitted with acute CHF exacerbation  PMHx:  Past Medical History  Diagnosis Date  . Hypertension   . Diabetes mellitus without complication   . High cholesterol   . Thyroid disease   . GERD (gastroesophageal reflux disease)   . Coronary artery disease    Diet Order: Heart Healthy  Current Nutrition: pt ate 100% at breakfast this AM, recorded po intake 75% since admission   Food/Nutrition-Related History: appetite has been good   Medications: lasix  Electrolyte/Renal Profile and Glucose Profile:   Recent Labs Lab 08/07/14 1955  NA 124*  K 4.0  CL 92*  CO2 25  BUN 13  CREATININE 0.80  CALCIUM 8.5*  GLUCOSE 143*   Protein Profile: No results for input(s): ALBUMIN in the last 168 hours.  Height:  Ht Readings from Last 1 Encounters:  08/07/14 5\' 2"  (1.575 m)    Weight:  Wt Readings from Last 1 Encounters:  08/09/14 225 lb 4.8 oz (102.195 kg)    Filed Weights   08/07/14 2312 08/08/14 0427 08/09/14 0427  Weight: 229 lb 6.4 oz (104.055 kg) 227 lb 1.6 oz (103.012 kg) 225 lb 4.8 oz (102.195 kg)   I/O: Net negative 3.5 Liters since admission  BMI:  Body mass index is 41.2 kg/(m^2).  Skin:   wdl   LOW Care Level  Kerman Passey MS, RD, LDN [phone] Pager

## 2014-08-10 LAB — BASIC METABOLIC PANEL
Anion gap: 7 (ref 5–15)
BUN: 13 mg/dL (ref 6–20)
CALCIUM: 8.2 mg/dL — AB (ref 8.9–10.3)
CO2: 31 mmol/L (ref 22–32)
Chloride: 90 mmol/L — ABNORMAL LOW (ref 101–111)
Creatinine, Ser: 0.8 mg/dL (ref 0.44–1.00)
GFR calc Af Amer: >60 mL/min (ref >60)
GLUCOSE: 106 mg/dL — AB (ref 65–99)
Potassium: 3.2 mmol/L — ABNORMAL LOW (ref 3.5–5.1)
SODIUM: 128 mmol/L — AB (ref 135–145)

## 2014-08-10 LAB — GLUCOSE, CAPILLARY
Glucose-Capillary: 110 mg/dL — ABNORMAL HIGH (ref 65–99)
Glucose-Capillary: 135 mg/dL — ABNORMAL HIGH (ref 65–99)

## 2014-08-10 MED ORDER — HYDRALAZINE HCL 25 MG PO TABS: 25.0000 mg | ORAL_TABLET | Freq: Three times a day (TID) | ORAL | Status: DC

## 2014-08-10 MED ORDER — POTASSIUM CHLORIDE CRYS ER 20 MEQ PO TBCR
40.0000 meq | EXTENDED_RELEASE_TABLET | Freq: Once | ORAL | Status: AC
  Administered 2014-08-10: 40 meq via ORAL
  Filled 2014-08-10: qty 2

## 2014-08-10 MED ORDER — FUROSEMIDE 40 MG PO TABS: 40.0000 mg | ORAL_TABLET | Freq: Every day | ORAL | Status: DC

## 2014-08-10 MED ORDER — IPRATROPIUM-ALBUTEROL 0.5-2.5 (3) MG/3ML IN SOLN
3.0000 mL | Freq: Four times a day (QID) | RESPIRATORY_TRACT | Status: DC
  Administered 2014-08-10: 3 mL via RESPIRATORY_TRACT
  Filled 2014-08-10 (×2): qty 3

## 2014-08-10 MED ORDER — FUROSEMIDE 40 MG PO TABS
40.0000 mg | ORAL_TABLET | Freq: Every day | ORAL | Status: DC
  Administered 2014-08-10: 40 mg via ORAL
  Filled 2014-08-10: qty 1

## 2014-08-10 MED ORDER — POTASSIUM CHLORIDE ER 20 MEQ PO TBCR: 20.0000 meq | EXTENDED_RELEASE_TABLET | Freq: Every day | ORAL | Status: DC

## 2014-08-10 NOTE — Discharge Summary (Signed)
[facility] NAME: Erica Larsen    MR#:  [id number]  DATE OF BIRTH:  [birth year]  DATE OF ADMISSION:  08/07/2014 ADMITTING PHYSICIAN: Lytle Butte, MD  DATE OF DISCHARGE: 08/10/2014  PRIMARY CARE PHYSICIAN: Golden Pop, MD    ADMISSION DIAGNOSIS:  diff breathing getting worse couple weeks  DISCHARGE DIAGNOSIS:  Active Problems:   Acute exacerbation of CHF (congestive heart failure)   SECONDARY DIAGNOSIS:   Past Medical History  Diagnosis Date  . Hypertension   . Diabetes mellitus without complication   . High cholesterol   . Thyroid disease   . GERD (gastroesophageal reflux disease)   . Coronary artery disease     HOSPITAL COURSE:   64 year old female with past medical history of type 2 diabetes, hypertension, hyperlipidemia, hypothyroidism, GERD, coronary disease, who presented to the hospital due to shortness of breath and noted to be in congestive heart failure.  #1 CHF-this was likely acute on chronic diastolic dysfunction. Patient apparently was not on any diuretics prior to coming into the hospital. -Patient was diuresed with IV Lasix and responded well to it is about 6 L negative since admission and clinically feels better. Patient is therefore being discharged on oral Lasix. -Patient will continue her beta blocker, Benicar. -Patient was seen by cardiology who agreed with this management.  #2 hypertension-patient's blood pressure was somewhat labile and therefore hydralazine was added to the patient's home regimen. -Patient therefore now is being discharged on hydralazine, Norvasc, Toprol, Benicar.  #3 type 2 diabetes without complication-continue Actos.  #4 history of previous coronary disease-patient had no acute chest pain. -Continue aspirin, Plavix, beta blocker, statin.  #5 hypothyroidism Patient was maintained on her Synthroid she will resume that.   DISCHARGE CONDITIONS:    Stable  CONSULTS OBTAINED:  Treatment Team:  Lytle Butte, MD Dustin Flock, MD Dionisio David, MD  DRUG ALLERGIES:   Allergies  Allergen Reactions  . Latex   . Sulfa Antibiotics     DISCHARGE MEDICATIONS:   Current Discharge Medication List    START taking these medications   Details  furosemide (LASIX) 40 MG tablet Take 1 tablet (40 mg total) by mouth daily. Qty: 60 tablet, Refills: 1    hydrALAZINE (APRESOLINE) 25 MG tablet Take 1 tablet (25 mg total) by mouth 3 (three) times daily. Qty: 90 tablet, Refills: 1    potassium chloride 20 MEQ TBCR Take 20 mEq by mouth daily. Qty: 30 tablet, Refills: 1      CONTINUE these medications which have NOT CHANGED   Details  amLODipine (NORVASC) 5 MG tablet Take 5 mg by mouth every morning.     aspirin 81 MG tablet Take 81 mg by mouth every morning.     atorvastatin (LIPITOR) 20 MG tablet Take 20 mg by mouth every morning.     clopidogrel (PLAVIX) 75 MG tablet Take 75 mg by mouth every morning.     lansoprazole (PREVACID) 30 MG capsule TAKE ONE CAPSULE BY MOUTH EVERY DAY Qty: 30 capsule, Refills: 5    levothyroxine (SYNTHROID, LEVOTHROID) 100 MCG tablet Take 100 mcg by mouth daily before breakfast.    metoprolol succinate (TOPROL-XL) 100 MG 24 hr tablet Take 100 mg by mouth every morning. Take with or immediately following a meal.    olmesartan (BENICAR) 40 MG tablet Take 40 mg by mouth every morning.     pioglitazone (ACTOS) 30 MG tablet Take 30 mg by mouth every  morning.     raloxifene (EVISTA) 60 MG tablet Take 60 mg by mouth every morning.          DISCHARGE INSTRUCTIONS:   DIET:  Cardiac diet  DISCHARGE CONDITION:  Stable  ACTIVITY:  Activity as tolerated  OXYGEN:  Home Oxygen: No.   Oxygen Delivery: room air  DISCHARGE LOCATION:  home   If you experience worsening of your admission symptoms, develop shortness of breath, life threatening emergency, suicidal or homicidal thoughts you must  seek medical attention immediately by calling 911 or calling your MD immediately  if symptoms less severe.  You Must read complete instructions/literature along with all the possible adverse reactions/side effects for all the Medicines you take and that have been prescribed to you. Take any new Medicines after you have completely understood and accpet all the possible adverse reactions/side effects.   Please note  You were cared for by a hospitalist during your hospital stay. If you have any questions about your discharge medications or the care you received while you were in the hospital after you are discharged, you can call the unit and asked to speak with the hospitalist on call if the hospitalist that took care of you is not available. Once you are discharged, your primary care physician will handle any further medical issues. Please note that NO REFILLS for any discharge medications will be authorized once you are discharged, as it is imperative that you return to your primary care physician (or establish a relationship with a primary care physician if you do not have one) for your aftercare needs so that they can reassess your need for medications and monitor your lab values.     Today    VITAL SIGNS:  Blood pressure 142/55, pulse 65, temperature 97.6 F (36.4 C), temperature source Oral, resp. rate 17, height 5\' 2"  (1.575 m), weight 99.701 kg (219 lb 12.8 oz), SpO2 98 %.  I/O:   Intake/Output Summary (Last 24 hours) at 08/10/14 1435 Last data filed at 08/10/14 1304  Gross per 24 hour  Intake   1200 ml  Output   3170 ml  Net  -1970 ml    PHYSICAL EXAMINATION:  GENERAL:  64 y.o.-year-old patient lying in the bed with no acute distress.  EYES: Pupils equal, round, reactive to light and accommodation. No scleral icterus. Extraocular muscles intact.  HEENT: Head atraumatic, normocephalic. Oropharynx and nasopharynx clear.  NECK:  Supple, no jugular venous distention. No thyroid  enlargement, no tenderness.  LUNGS: Normal breath sounds bilaterally, no wheezing, rales,rhonchi. No use of accessory muscles of respiration.  CARDIOVASCULAR: S1, S2 normal. No murmurs, rubs, or gallops.  ABDOMEN: Soft, non-tender, non-distended. Bowel sounds present. No organomegaly or mass.  EXTREMITIES: No pedal edema, cyanosis, or clubbing.  NEUROLOGIC: Cranial nerves II through XII are intact. No focal motor or sensory defecits b/l.  PSYCHIATRIC: The patient is alert and oriented x 3. Good affect.  SKIN: No obvious rash, lesion, or ulcer.   DATA REVIEW:   CBC  Recent Labs Lab 08/09/14 1114  WBC 6.2  HGB 8.3*  HCT 26.2*  PLT 213    Chemistries   Recent Labs Lab 08/10/14 0348  NA 128*  K 3.2*  CL 90*  CO2 31  GLUCOSE 106*  BUN 13  CREATININE 0.80  CALCIUM 8.2*    Cardiac Enzymes  Recent Labs Lab 08/08/14 1101  TROPONINI <0.03    Microbiology Results  No results found for this or any previous visit.  RADIOLOGY:  No results found.    Management plans discussed with the patient, family and they are in agreement.  CODE STATUS:     Code Status Orders        Start     Ordered   08/07/14 2206  Full code   Continuous     08/07/14 2206      TOTAL TIME TAKING CARE OF THIS PATIENT: 40 minutes.    Henreitta Leber M.D on 08/10/2014 at 2:35 PM  Between 7am to 6pm - Pager - [phone]  After 6pm go to [email or web address] - password [email or web address] [facility]  Office  [phone]  CC: Primary care physician; Golden Pop, MD

## 2014-08-10 NOTE — Discharge Instructions (Signed)

## 2014-08-10 NOTE — Care Management (Signed)
Patient for discharge today.  No needs.  Notified [facility] Clinic of discharge and provided patient with brochure.

## 2014-08-10 NOTE — Progress Notes (Signed)
   SUBJECTIVE: Pt feeling much better, breathing improved, slept well, no orthopnea.    Filed Vitals:   08/10/14 0415 08/10/14 0752 08/10/14 0920 08/10/14 0950  BP: 149/68  134/50   Pulse: 67  71   Temp: 97.8 F (36.6 C)  98.1 F (36.7 C)   TempSrc: Oral  Oral   Resp: 16     Height:      Weight: 99.701 kg (219 lb 12.8 oz)     SpO2: 94% 95% 96% 93%    Intake/Output Summary (Last 24 hours) at 08/10/14 1053 Last data filed at 08/10/14 1017  Gross per 24 hour  Intake    480 ml  Output   4050 ml  Net  -3570 ml    LABS: Basic Metabolic Panel:  Recent Labs  08/07/14 1955 08/10/14 0348  NA 124* 128*  K 4.0 3.2*  CL 92* 90*  CO2 25 31  GLUCOSE 143* 106*  BUN 13 13  CREATININE 0.80 0.80  CALCIUM 8.5* 8.2*   Liver Function Tests: No results for input(s): AST, ALT, ALKPHOS, BILITOT, PROT, ALBUMIN in the last 72 hours. No results for input(s): LIPASE, AMYLASE in the last 72 hours. CBC:  Recent Labs  08/07/14 1955 08/09/14 1114  WBC 6.5 6.2  NEUTROABS  --  4.3  HGB 9.1* 8.3*  HCT 27.8* 26.2*  MCV 86.2 86.5  PLT 250 213   Cardiac Enzymes:  Recent Labs  08/08/14 0228 08/08/14 0442 08/08/14 1101  TROPONINI <0.03 <0.03 <0.03   BNP: Invalid input(s): POCBNP D-Dimer: No results for input(s): DDIMER in the last 72 hours. Hemoglobin A1C: No results for input(s): HGBA1C in the last 72 hours. Fasting Lipid Panel: No results for input(s): CHOL, HDL, LDLCALC, TRIG, CHOLHDL, LDLDIRECT in the last 72 hours. Thyroid Function Tests: No results for input(s): TSH, T4TOTAL, T3FREE, THYROIDAB in the last 72 hours.  Invalid input(s): FREET3 Anemia Panel:  Recent Labs  08/09/14 1113  VITAMINB12 229  FOLATE 16.6  FERRITIN 42  TIBC 482*  IRON 20*     PHYSICAL EXAM General: Well developed, well nourished, in no acute distress HEENT:  Normocephalic and atramatic Neck:  No JVD.  Lungs: Clear bilaterally to auscultation and percussion. Heart: HRRR . Normal S1  and S2 without gallops or murmurs.  Abdomen: Bowel sounds are positive, abdomen soft and non-tender  Msk:  Back normal, normal gait. Normal strength and tone for age. Extremities: No clubbing, cyanosis or edema.   Neuro: Alert and oriented X 3. Psych:  Good affect, responds appropriately  TELEMETRY: Reviewed telemetry pt in NSR  ASSESSMENT AND PLAN: Pt with diastolic dysfunction, will need outpatient NST. Pt will f/u in office Monday 6/27 at 10am to set up NST.    Patient and plan discussed with supervising provider, Dr. Neoma Laming, who agrees with above findings.   Kelby Fam [place], [place]  08/10/2014 10:53 AM

## 2014-08-10 NOTE — Progress Notes (Signed)
Received order for discharge, patient denies any pain or discomfort,   discharge instruction provided, IV removed, tele removed patient discharge home as per order .

## 2014-08-10 NOTE — Progress Notes (Signed)
Other Acute Treatment Outcomes Patient has no acute event. She is A&O X4, denied pain and stated that she feels better. She is hemodynamically stable, NSR on the monitor with VS WDL for patient. Patient remained on room air with stable O2sat. Will continue to monitor.Patient was offered bathroom assistance with safety checks and as needed. Patient's needed items were kept within patient's reach

## 2014-08-14 ENCOUNTER — Other Ambulatory Visit: Payer: Self-pay | Admitting: Unknown Physician Specialty

## 2014-08-15 DIAGNOSIS — R079 Chest pain, unspecified: Secondary | ICD-10-CM | POA: Diagnosis not present

## 2014-08-15 DIAGNOSIS — I340 Nonrheumatic mitral (valve) insufficiency: Secondary | ICD-10-CM | POA: Diagnosis not present

## 2014-08-15 DIAGNOSIS — I509 Heart failure, unspecified: Secondary | ICD-10-CM | POA: Diagnosis not present

## 2014-08-15 DIAGNOSIS — I10 Essential (primary) hypertension: Secondary | ICD-10-CM | POA: Diagnosis not present

## 2014-08-15 DIAGNOSIS — I2510 Atherosclerotic heart disease of native coronary artery without angina pectoris: Secondary | ICD-10-CM | POA: Diagnosis not present

## 2014-08-15 DIAGNOSIS — E782 Mixed hyperlipidemia: Secondary | ICD-10-CM | POA: Diagnosis not present

## 2014-08-16 ENCOUNTER — Ambulatory Visit: Payer: Medicare Other | Admitting: Family

## 2014-08-17 ENCOUNTER — Encounter: Payer: Self-pay | Admitting: Family

## 2014-08-17 ENCOUNTER — Inpatient Hospital Stay: Payer: Self-pay | Admitting: Family Medicine

## 2014-08-17 ENCOUNTER — Ambulatory Visit: Payer: PRIVATE HEALTH INSURANCE | Attending: Family | Admitting: Family

## 2014-08-17 VITALS — BP 110/40 | HR 70 | Resp 20 | Ht 62.0 in | Wt 209.0 lb

## 2014-08-17 DIAGNOSIS — E780 Pure hypercholesterolemia: Secondary | ICD-10-CM | POA: Diagnosis not present

## 2014-08-17 DIAGNOSIS — K219 Gastro-esophageal reflux disease without esophagitis: Secondary | ICD-10-CM | POA: Insufficient documentation

## 2014-08-17 DIAGNOSIS — E079 Disorder of thyroid, unspecified: Secondary | ICD-10-CM | POA: Insufficient documentation

## 2014-08-17 DIAGNOSIS — Z7982 Long term (current) use of aspirin: Secondary | ICD-10-CM | POA: Diagnosis not present

## 2014-08-17 DIAGNOSIS — I509 Heart failure, unspecified: Secondary | ICD-10-CM | POA: Insufficient documentation

## 2014-08-17 DIAGNOSIS — Z79899 Other long term (current) drug therapy: Secondary | ICD-10-CM | POA: Insufficient documentation

## 2014-08-17 DIAGNOSIS — E119 Type 2 diabetes mellitus without complications: Secondary | ICD-10-CM | POA: Insufficient documentation

## 2014-08-17 DIAGNOSIS — I5032 Chronic diastolic (congestive) heart failure: Secondary | ICD-10-CM | POA: Insufficient documentation

## 2014-08-17 DIAGNOSIS — I130 Hypertensive heart and chronic kidney disease with heart failure and stage 1 through stage 4 chronic kidney disease, or unspecified chronic kidney disease: Secondary | ICD-10-CM | POA: Insufficient documentation

## 2014-08-17 DIAGNOSIS — N184 Chronic kidney disease, stage 4 (severe): Secondary | ICD-10-CM

## 2014-08-17 DIAGNOSIS — I10 Essential (primary) hypertension: Secondary | ICD-10-CM | POA: Diagnosis not present

## 2014-08-17 DIAGNOSIS — I2510 Atherosclerotic heart disease of native coronary artery without angina pectoris: Secondary | ICD-10-CM | POA: Insufficient documentation

## 2014-08-17 NOTE — Patient Instructions (Addendum)
Begin weighing daily and call for an overnight weight gain of >2 pounds or >5 pounds in a week.

## 2014-08-17 NOTE — Progress Notes (Signed)
Subjective:    Patient ID: Erica Larsen, female    DOB: 1950/09/14, 64 y.o.   MRN: [id number]  Congestive Heart Failure Presents for initial visit. The disease course has been improving. Associated symptoms include fatigue and shortness of breath (only with a lot of exertion). Pertinent negatives include no abdominal pain, chest pain, edema, orthopnea, palpitations or paroxysmal nocturnal dyspnea. The symptoms have been stable. Past treatments include beta blockers, salt and fluid restriction and aldosterone receptor blockers. The treatment provided moderate relief. Compliance with prior treatments has been good. Her past medical history is significant for CAD, DM and HTN.    Past Medical History  Diagnosis Date  . Hypertension   . Diabetes mellitus without complication   . High cholesterol   . Thyroid disease   . GERD (gastroesophageal reflux disease)   . Coronary artery disease      Past Surgical History  Procedure Laterality Date  . Coronary angioplasty with stent placement        History  Substance Use Topics  . Smoking status: Never Smoker   . Smokeless tobacco: Not on file  . Alcohol Use: No    Allergies  Allergen Reactions  . Latex   . Sulfa Antibiotics     Prior to Admission medications   Medication Sig Start Date End Date Taking? Authorizing Provider  aspirin 81 MG tablet Take 81 mg by mouth every morning.    Yes Historical Provider, MD  atorvastatin (LIPITOR) 20 MG tablet Take 20 mg by mouth every morning.    Yes Historical Provider, MD  BENICAR 40 MG tablet TAKE ONE TABLET BY MOUTH EVERY DAY 08/15/14  Yes Kathrine Haddock, NP  clopidogrel (PLAVIX) 75 MG tablet Take 75 mg by mouth every morning.    Yes Historical Provider, MD  furosemide (LASIX) 40 MG tablet Take 1 tablet (40 mg total) by mouth daily. 08/10/14  Yes Henreitta Leber, MD  hydrALAZINE (APRESOLINE) 25 MG tablet Take 1 tablet (25 mg total) by mouth 3 (three) times daily. 08/10/14  Yes Henreitta Leber,  MD  lansoprazole (PREVACID) 30 MG capsule TAKE ONE CAPSULE BY MOUTH EVERY DAY 07/26/14  Yes Kathrine Haddock, NP  levothyroxine (SYNTHROID, LEVOTHROID) 100 MCG tablet Take 100 mcg by mouth daily before breakfast.   Yes Historical Provider, MD  metoprolol succinate (TOPROL-XL) 100 MG 24 hr tablet Take 50 mg by mouth daily. Take with or immediately following a meal.   Yes Historical Provider, MD  pioglitazone (ACTOS) 30 MG tablet Take 30 mg by mouth every morning.    Yes Historical Provider, MD  potassium chloride 20 MEQ TBCR Take 20 mEq by mouth daily. 08/10/14  Yes Henreitta Leber, MD  raloxifene (EVISTA) 60 MG tablet Take 60 mg by mouth every morning.    Yes Historical Provider, MD      Review of Systems  Constitutional: Positive for fatigue. Negative for appetite change.  HENT: Negative for congestion, sore throat and trouble swallowing.   Eyes: Negative.   Respiratory: Positive for cough (dry cough) and shortness of breath (only with a lot of exertion). Negative for chest tightness.   Cardiovascular: Negative for chest pain, palpitations and leg swelling.  Gastrointestinal: Negative for abdominal pain and abdominal distention.  Endocrine: Negative.   Genitourinary: Negative.   Musculoskeletal: Positive for back pain (down both side of the back mid way down). Negative for neck pain.  Skin: Negative.   Allergic/Immunologic: Negative.   Neurological: Negative for dizziness, numbness and headaches.  Hematological: Negative for adenopathy. Bruises/bleeds easily.  Psychiatric/Behavioral: Negative for sleep disturbance (sleeping on 1-2 pillows). The patient is not nervous/anxious.        Objective:   Physical Exam  Constitutional: She is oriented to person, place, and time. She appears well-developed and well-nourished.  HENT:  Head: Normocephalic and atraumatic.  Eyes: Conjunctivae are normal. Pupils are equal, round, and reactive to light.  Neck: Normal range of motion. Neck supple.   Cardiovascular: Normal rate and regular rhythm.   Pulmonary/Chest: Effort normal and breath sounds normal.  Abdominal: Soft. She exhibits no distension. There is no tenderness.  Musculoskeletal: She exhibits no edema or tenderness.  Neurological: She is alert and oriented to person, place, and time.  Skin: Skin is warm and dry. No erythema.  Psychiatric: She has a normal mood and affect. Her behavior is normal. Thought content normal.  Nursing note and vitals reviewed.   BP 110/40 mmHg  Pulse 70  Resp 20  Ht 5\' 2"  (1.575 m)  Wt 209 lb (94.802 kg)  BMI 38.22 kg/m2  SpO2 100%        Assessment & Plan:  1: Chronic heart failure with preserved ejection fraction- Patient presents with some shortness of breath but only with a lot of exertion. She denied being short of breath upon walking into the office today. She also endorses a mild amount of fatigue with exertion. She does weigh herself but admits that she doesn't weigh herself every day. Discussed the importance of weighing daily and writing the weight down to bring back for review. Instructed to call for an overnight weight gain of >2 pounds or a weekly weight gain of >5 pounds. She says that she's not adding any salt to her food and has been reading food labels looking at sodium content. Encouraged her to closely follow a 2000mg  sodium diet and written information was given to her about this.  2: Diabetes- She says that her glucose levels have been "fine" but says that she didn't check it this morning. She says that she doesn't check it daily and we discussed the importance of checking it on a daily basis. 3: HTN- Blood pressure looks good today. Continue medications at this time. 4: CAD- Patient has had a stent placed in the past. Is on plavix and aspirin. Due to have a stress done around August 26, 2014.  Return in 1 month or sooner for any questions/problems before the next office visit.

## 2014-09-01 DIAGNOSIS — I420 Dilated cardiomyopathy: Secondary | ICD-10-CM | POA: Diagnosis not present

## 2014-09-01 DIAGNOSIS — E782 Mixed hyperlipidemia: Secondary | ICD-10-CM | POA: Diagnosis not present

## 2014-09-01 DIAGNOSIS — I2510 Atherosclerotic heart disease of native coronary artery without angina pectoris: Secondary | ICD-10-CM | POA: Diagnosis not present

## 2014-09-01 DIAGNOSIS — I340 Nonrheumatic mitral (valve) insufficiency: Secondary | ICD-10-CM | POA: Diagnosis not present

## 2014-09-01 DIAGNOSIS — I10 Essential (primary) hypertension: Secondary | ICD-10-CM | POA: Diagnosis not present

## 2014-09-16 ENCOUNTER — Ambulatory Visit: Payer: Medicare Other | Admitting: Family

## 2014-09-30 DIAGNOSIS — R9430 Abnormal result of cardiovascular function study, unspecified: Secondary | ICD-10-CM | POA: Diagnosis not present

## 2014-10-04 DIAGNOSIS — I10 Essential (primary) hypertension: Secondary | ICD-10-CM | POA: Diagnosis not present

## 2014-10-04 DIAGNOSIS — E782 Mixed hyperlipidemia: Secondary | ICD-10-CM | POA: Diagnosis not present

## 2014-10-04 DIAGNOSIS — I420 Dilated cardiomyopathy: Secondary | ICD-10-CM | POA: Diagnosis not present

## 2014-10-04 DIAGNOSIS — I2510 Atherosclerotic heart disease of native coronary artery without angina pectoris: Secondary | ICD-10-CM | POA: Diagnosis not present

## 2014-10-04 DIAGNOSIS — I340 Nonrheumatic mitral (valve) insufficiency: Secondary | ICD-10-CM | POA: Diagnosis not present

## 2014-10-11 ENCOUNTER — Ambulatory Visit: Payer: Medicare Other | Admitting: Family

## 2014-10-20 ENCOUNTER — Other Ambulatory Visit: Payer: Self-pay | Admitting: Unknown Physician Specialty

## 2014-10-20 NOTE — Telephone Encounter (Signed)
Needs seen further refills 

## 2014-11-14 ENCOUNTER — Ambulatory Visit: Payer: Medicare Other | Admitting: Family

## 2014-11-25 ENCOUNTER — Encounter: Payer: Self-pay | Admitting: Unknown Physician Specialty

## 2014-11-25 ENCOUNTER — Other Ambulatory Visit: Payer: Self-pay | Admitting: Unknown Physician Specialty

## 2014-11-25 ENCOUNTER — Ambulatory Visit (INDEPENDENT_AMBULATORY_CARE_PROVIDER_SITE_OTHER): Payer: PRIVATE HEALTH INSURANCE | Admitting: Unknown Physician Specialty

## 2014-11-25 VITALS — BP 148/71 | HR 73 | Temp 98.0°F | Ht 62.1 in | Wt 222.8 lb

## 2014-11-25 DIAGNOSIS — D649 Anemia, unspecified: Secondary | ICD-10-CM | POA: Diagnosis not present

## 2014-11-25 DIAGNOSIS — I10 Essential (primary) hypertension: Secondary | ICD-10-CM

## 2014-11-25 DIAGNOSIS — E876 Hypokalemia: Secondary | ICD-10-CM | POA: Diagnosis not present

## 2014-11-25 DIAGNOSIS — I5032 Chronic diastolic (congestive) heart failure: Secondary | ICD-10-CM | POA: Diagnosis not present

## 2014-11-25 DIAGNOSIS — N184 Chronic kidney disease, stage 4 (severe): Secondary | ICD-10-CM | POA: Insufficient documentation

## 2014-11-25 DIAGNOSIS — E1122 Type 2 diabetes mellitus with diabetic chronic kidney disease: Secondary | ICD-10-CM

## 2014-11-25 DIAGNOSIS — N183 Chronic kidney disease, stage 3 unspecified: Secondary | ICD-10-CM

## 2014-11-25 DIAGNOSIS — Z23 Encounter for immunization: Secondary | ICD-10-CM | POA: Diagnosis not present

## 2014-11-25 DIAGNOSIS — I2510 Atherosclerotic heart disease of native coronary artery without angina pectoris: Secondary | ICD-10-CM | POA: Diagnosis not present

## 2014-11-25 LAB — LIPID PANEL PICCOLO, WAIVED
CHOL/HDL RATIO PICCOLO,WAIVE: 1.7 mg/dL
Cholesterol Piccolo, Waived: 120 mg/dL (ref <200)
HDL CHOL PICCOLO, WAIVED: 71 mg/dL (ref >59)
LDL Chol Calc Piccolo Waived: 32 mg/dL (ref <100)
Triglycerides Piccolo,Waived: 83 mg/dL (ref <150)
VLDL Chol Calc Piccolo,Waive: 17 mg/dL (ref <30)

## 2014-11-25 LAB — BAYER DCA HB A1C WAIVED: HB A1C (BAYER DCA - WAIVED): 6 % (ref <7.0)

## 2014-11-25 LAB — MICROALBUMIN, URINE WAIVED
CREATININE, URINE WAIVED: 10 mg/dL (ref 10–300)
Microalb, Ur Waived: 10 mg/L (ref 0–19)
Microalb/Creat Ratio: <30 mg/g (ref <30)

## 2014-11-25 MED ORDER — OLMESARTAN MEDOXOMIL 40 MG PO TABS: 40.0000 mg | ORAL_TABLET | Freq: Every day | ORAL | Status: DC

## 2014-11-25 NOTE — Assessment & Plan Note (Signed)
Borderline but reasonable control

## 2014-11-25 NOTE — Assessment & Plan Note (Addendum)
Return to heart failure clinic as to change in Lasix if necessary.  Patient has an appointment already

## 2014-11-25 NOTE — Assessment & Plan Note (Signed)
Hgb A1C is 6.0.  Stop Actos as may be exacerbating heart failure

## 2014-11-25 NOTE — Assessment & Plan Note (Addendum)
Check HGB and HCT.  Cause needs to be ddetermined.  ? Anemia of chronic disease.  No f/u done in the hospital.

## 2014-11-25 NOTE — Progress Notes (Signed)
BP 148/71 mmHg  Pulse 73  Temp(Src) 98 F (36.7 C)  Ht 5' 2.1" (1.577 m)  Wt 222 lb 12.8 oz (101.061 kg)  BMI 40.64 kg/m2  SpO2 97%   Subjective:    Patient ID: Erica Larsen, female    DOB: [birth year], 64 y.o.   MRN: [id number]  HPI: Erica Larsen is a 64 y.o. female  Chief Complaint  Patient presents with  . Diabetes  . Hyperlipidemia   CHF/CAD Pt was in the hospital in June for heart failure.  She does see Dr. Humphrey Rolls. She is "a little"  SOB.  She did take a fluid pill and wonders if she should take another.  She saw Dr. Humphrey Rolls beginning of August and not due to see him tell December.  Reviewed hospital notes.  Reviewed labs. I have notes from a NP who works with heart failure patients through the hospital.  She was given these weight insructions "Discussed the importance of weighing daily and writing the weight down to bring back for review. Instructed to call for an overnight weight gain of >2 pounds or a weekly weight gain of >5 pounds." which pt doesn't recall.    CKD Reviewed past notes and referred to nephrology due to anemia of chronic disease and GFR of 48.  I have no notes and patient doesn't recall going to a nephrologist.    Diabetes She presents for her follow-up diabetic visit. She has type 2 diabetes mellitus. There are no hypoglycemic associated symptoms. Pertinent negatives for diabetes include no chest pain, no polydipsia, no polyphagia and no polyuria. There are no hypoglycemic complications. Diabetic symptom progression: unknown. Diabetic complications include heart disease and nephropathy. Current diabetic treatment includes oral agent (monotherapy) (Taking Actos which did not change after diagnosed with heart failure). She is compliant with treatment all of the time. Frequency home blood tests: no strips. Eye exam is not current.  Hyperlipidemia This is a chronic problem. The problem is controlled. Exacerbating diseases include chronic renal disease, diabetes and  obesity. Associated symptoms include shortness of breath. Pertinent negatives include no chest pain or focal weakness. There are no compliance problems.    Anemia Noted here and noted on hospital labs.  Reviewing discharge summary, it doesn't seem to be addressed.    Relevant past medical, surgical, family and social history reviewed and updated as indicated. Interim medical history since our last visit reviewed. Allergies and medications reviewed and updated.  Review of Systems  Constitutional: Negative.   HENT: Negative.   Eyes: Negative.   Respiratory: Positive for shortness of breath.   Cardiovascular: Negative.  Negative for chest pain.  Gastrointestinal: Negative.   Endocrine: Negative for polydipsia, polyphagia and polyuria.  Genitourinary: Negative.   Musculoskeletal: Negative.   Skin: Negative.   Allergic/Immunologic: Negative.   Neurological: Negative.  Negative for focal weakness.  Hematological: Negative.   Psychiatric/Behavioral: Negative.     Per HPI unless specifically indicated above     Objective:    BP 148/71 mmHg  Pulse 73  Temp(Src) 98 F (36.7 C)  Ht 5' 2.1" (1.577 m)  Wt 222 lb 12.8 oz (101.061 kg)  BMI 40.64 kg/m2  SpO2 97%  Wt Readings from Last 3 Encounters:  11/25/14 222 lb 12.8 oz (101.061 kg)  06/01/14 222 lb (100.699 kg)  08/17/14 209 lb (94.802 kg)    Physical Exam  Constitutional: She is oriented to person, place, and time. She appears well-developed and well-nourished. No distress.  HENT:  Head:  Normocephalic and atraumatic.  Eyes: Conjunctivae and lids are normal. Right eye exhibits no discharge. Left eye exhibits no discharge. No scleral icterus.  Cardiovascular: Normal rate, regular rhythm and normal heart sounds.   Pulmonary/Chest: Effort normal and breath sounds normal. No respiratory distress. She has no rales.  Abdominal: Normal appearance. She exhibits no distension. There is no splenomegaly or hepatomegaly. There is no  tenderness.  Musculoskeletal: Normal range of motion.  Neurological: She is alert and oriented to person, place, and time.  Skin: Skin is intact. No rash noted. No pallor.  Psychiatric: She has a normal mood and affect. Her behavior is normal. Judgment and thought content normal.     Assessment & Plan:   Problem List Items Addressed This Visit      Unprioritized   Chronic diastolic heart failure ([place])    Return to heart failure clinic as to change in Lasix if necessary.  Patient has an appointment already      Relevant Medications   atorvastatin (LIPITOR) 80 MG tablet   metoprolol (LOPRESSOR) 50 MG tablet   olmesartan (BENICAR) 40 MG tablet   Diabetes mellitus with chronic kidney disease (HCC)    Hgb A1C is 6.0.  Stop Actos as may be exacerbating heart failure      Relevant Medications   atorvastatin (LIPITOR) 80 MG tablet   olmesartan (BENICAR) 40 MG tablet   Other Relevant Orders   Comprehensive metabolic panel   CBC with Differential/Platelet   Lipid Panel Piccolo, Waived   Bayer DCA Hb A1c Waived   Essential hypertension    Borderline but reasonable control      Relevant Medications   atorvastatin (LIPITOR) 80 MG tablet   metoprolol (LOPRESSOR) 50 MG tablet   olmesartan (BENICAR) 40 MG tablet   Other Relevant Orders   Microalbumin, Urine Waived   Uric acid   CAD (coronary artery disease)   Relevant Medications   atorvastatin (LIPITOR) 80 MG tablet   metoprolol (LOPRESSOR) 50 MG tablet   olmesartan (BENICAR) 40 MG tablet   Absolute anemia    Check HGB and HCT.  Cause needs to be ddetermined.  ? Anemia of chronic disease.  No f/u done in the hospital.      Relevant Orders   CBC with Differential/Platelet   Hypokalemia    Noted in the hospital.  Will await K results       Other Visit Diagnoses    Immunization due    -  Primary    Relevant Orders    Flu Vaccine QUAD 36+ mos PF IM (Fluarix & Fluzone Quad PF) (Completed)       At least 25 minutes of face  to face time spent with this patient.    Follow up plan: Return in about 3 months (around 02/25/2015).  Review labs and make referrals and change as necessary

## 2014-11-25 NOTE — Assessment & Plan Note (Addendum)
GFR pending.  Consider another nephrology referral

## 2014-11-25 NOTE — Assessment & Plan Note (Signed)
Noted in the hospital.  Will await K results

## 2014-11-26 LAB — CBC WITH DIFFERENTIAL/PLATELET
Basophils Absolute: 0 x10E3/uL (ref 0.0–0.2)
Basos: 0 %
EOS (ABSOLUTE): 0.3 x10E3/uL (ref 0.0–0.4)
Eos: 6 %
Hematocrit: 25.1 % — ABNORMAL LOW (ref 34.0–46.6)
Hemoglobin: 8 g/dL — ABNORMAL LOW (ref 11.1–15.9)
Immature Grans (Abs): 0 x10E3/uL (ref 0.0–0.1)
Immature Granulocytes: 0 %
Lymphocytes Absolute: 0.9 x10E3/uL (ref 0.7–3.1)
Lymphs: 17 %
MCH: 28.1 pg (ref 26.6–33.0)
MCHC: 31.9 g/dL (ref 31.5–35.7)
MCV: 88 fL (ref 79–97)
Monocytes Absolute: 0.8 x10E3/uL (ref 0.1–0.9)
Monocytes: 14 %
Neutrophils Absolute: 3.5 x10E3/uL (ref 1.4–7.0)
Neutrophils: 63 %
Platelets: 220 x10E3/uL (ref 150–379)
RBC: 2.85 x10E6/uL — ABNORMAL LOW (ref 3.77–5.28)
RDW: 17.2 % — ABNORMAL HIGH (ref 12.3–15.4)
WBC: 5.6 x10E3/uL (ref 3.4–10.8)

## 2014-11-26 LAB — COMPREHENSIVE METABOLIC PANEL
ALK PHOS: 78 IU/L (ref 39–117)
ALT: 20 IU/L (ref 0–32)
AST: 26 IU/L (ref 0–40)
Albumin/Globulin Ratio: 2.3 (ref 1.1–2.5)
Albumin: 4.4 g/dL (ref 3.6–4.8)
BILIRUBIN TOTAL: 0.6 mg/dL (ref 0.0–1.2)
BUN/Creatinine Ratio: 14 (ref 11–26)
BUN: 15 mg/dL (ref 8–27)
CHLORIDE: 95 mmol/L — AB (ref 97–108)
CO2: 25 mmol/L (ref 18–29)
Calcium: 9.5 mg/dL (ref 8.7–10.3)
Creatinine, Ser: 1.09 mg/dL — ABNORMAL HIGH (ref 0.57–1.00)
GFR calc non Af Amer: 53 mL/min/{1.73_m2} — ABNORMAL LOW (ref >59)
GFR, EST AFRICAN AMERICAN: 61 mL/min/{1.73_m2} (ref >59)
GLUCOSE: 118 mg/dL — AB (ref 65–99)
Globulin, Total: 1.9 g/dL (ref 1.5–4.5)
POTASSIUM: 4.7 mmol/L (ref 3.5–5.2)
Sodium: 136 mmol/L (ref 134–144)
Total Protein: 6.3 g/dL (ref 6.0–8.5)

## 2014-11-26 LAB — URIC ACID: Uric Acid: 5.1 mg/dL (ref 2.5–7.1)

## 2014-11-28 ENCOUNTER — Telehealth: Payer: Self-pay | Admitting: Unknown Physician Specialty

## 2014-11-28 DIAGNOSIS — D649 Anemia, unspecified: Secondary | ICD-10-CM

## 2014-11-28 NOTE — Telephone Encounter (Signed)
F/u phone call and no answer.  Left message to tell her I will need to send her to the hematologist after discussing her with Dr. Jeananne Rama.  I will set up the referral.

## 2014-11-28 NOTE — Telephone Encounter (Signed)
Phone call with patient discussing anemia.  Probable anemia of chronic disease according to anemia panel in April.  Pt refusing hematology or nephrology evaluation.  Will check anemia panel at 3 month visit.

## 2014-12-12 ENCOUNTER — Inpatient Hospital Stay: Payer: PRIVATE HEALTH INSURANCE | Attending: Internal Medicine | Admitting: Internal Medicine

## 2014-12-12 ENCOUNTER — Encounter: Payer: Self-pay | Admitting: Internal Medicine

## 2014-12-12 ENCOUNTER — Inpatient Hospital Stay: Payer: PRIVATE HEALTH INSURANCE

## 2014-12-12 VITALS — BP 168/83 | HR 70 | Temp 97.7°F | Resp 18

## 2014-12-12 DIAGNOSIS — E079 Disorder of thyroid, unspecified: Secondary | ICD-10-CM | POA: Insufficient documentation

## 2014-12-12 DIAGNOSIS — M5416 Radiculopathy, lumbar region: Secondary | ICD-10-CM | POA: Insufficient documentation

## 2014-12-12 DIAGNOSIS — E7800 Pure hypercholesterolemia, unspecified: Secondary | ICD-10-CM | POA: Insufficient documentation

## 2014-12-12 DIAGNOSIS — Z7982 Long term (current) use of aspirin: Secondary | ICD-10-CM | POA: Diagnosis not present

## 2014-12-12 DIAGNOSIS — Z79899 Other long term (current) drug therapy: Secondary | ICD-10-CM

## 2014-12-12 DIAGNOSIS — D500 Iron deficiency anemia secondary to blood loss (chronic): Secondary | ICD-10-CM

## 2014-12-12 DIAGNOSIS — K219 Gastro-esophageal reflux disease without esophagitis: Secondary | ICD-10-CM

## 2014-12-12 DIAGNOSIS — D649 Anemia, unspecified: Secondary | ICD-10-CM | POA: Diagnosis not present

## 2014-12-12 DIAGNOSIS — I509 Heart failure, unspecified: Secondary | ICD-10-CM | POA: Diagnosis not present

## 2014-12-12 DIAGNOSIS — M8580 Other specified disorders of bone density and structure, unspecified site: Secondary | ICD-10-CM

## 2014-12-12 DIAGNOSIS — I2510 Atherosclerotic heart disease of native coronary artery without angina pectoris: Secondary | ICD-10-CM | POA: Diagnosis not present

## 2014-12-12 DIAGNOSIS — E119 Type 2 diabetes mellitus without complications: Secondary | ICD-10-CM | POA: Insufficient documentation

## 2014-12-12 DIAGNOSIS — I10 Essential (primary) hypertension: Secondary | ICD-10-CM | POA: Insufficient documentation

## 2014-12-12 DIAGNOSIS — M1990 Unspecified osteoarthritis, unspecified site: Secondary | ICD-10-CM | POA: Diagnosis not present

## 2014-12-12 DIAGNOSIS — K5790 Diverticulosis of intestine, part unspecified, without perforation or abscess without bleeding: Secondary | ICD-10-CM | POA: Diagnosis not present

## 2014-12-12 DIAGNOSIS — E669 Obesity, unspecified: Secondary | ICD-10-CM | POA: Diagnosis not present

## 2014-12-12 LAB — CBC WITH DIFFERENTIAL/PLATELET
BASOS ABS: 0 10*3/uL (ref 0–0.1)
BASOS PCT: 0 %
EOS ABS: 0.2 10*3/uL (ref 0–0.7)
Eosinophils Relative: 3 %
HEMATOCRIT: 27.1 % — AB (ref 35.0–47.0)
Hemoglobin: 8.6 g/dL — ABNORMAL LOW (ref 12.0–16.0)
Lymphocytes Relative: 19 %
Lymphs Abs: 1.2 10*3/uL (ref 1.0–3.6)
MCH: 27.4 pg (ref 26.0–34.0)
MCHC: 31.8 g/dL — AB (ref 32.0–36.0)
MCV: 86.1 fL (ref 80.0–100.0)
MONO ABS: 0.7 10*3/uL (ref 0.2–0.9)
MONOS PCT: 12 %
Neutro Abs: 4.2 10*3/uL (ref 1.4–6.5)
Neutrophils Relative %: 66 %
PLATELETS: 278 10*3/uL (ref 150–440)
RBC: 3.15 MIL/uL — ABNORMAL LOW (ref 3.80–5.20)
RDW: 16.4 % — AB (ref 11.5–14.5)
WBC: 6.3 10*3/uL (ref 3.6–11.0)

## 2014-12-12 NOTE — Progress Notes (Signed)
[facility] CONSULT NOTE  Patient Care Team: Guadalupe Maple, MD as PCP - General (Family Medicine)  CHIEF COMPLAINTS/PURPOSE OF CONSULTATION:   # 2014- Anemia Normocytic-  EGD/COLO- NEG/ferritin 6;     HISTORY OF PRESENTING ILLNESS:  Erica Larsen 64 y.o.  female history of congestive heart failure and CAD on aspirin and Plavix is here for workup/ recommendations for worsening anemia.  Patient states that she was told to be anemic approximately 2 years ago; and she had workup with EGD colonoscopy that was negative. Patient denies a capsule study. Patient was started on by mouth iron; she is intolerant to by mouth iron.  Patient denies any blood in stools black stools or blood in urine or any vaginal discharge. She complains of worsening fatigue with exertion. Denies any family history of colon cancer stomach cancers.  Denies any weight loss. Denies any difficulty swallowing. No unusual constipation or diarrhea. Patient does get swelling of the legs; of puffy face which she attributes to her CHF.   ROS: A complete 10 point review of system is done which is negative except mentioned above in history of present illness  MEDICAL HISTORY:  Past Medical History  Diagnosis Date  . Hypertension   . Diabetes mellitus without complication ([place])   . High cholesterol   . Thyroid disease   . GERD (gastroesophageal reflux disease)   . Coronary artery disease   . Diverticulosis   . Osteopenia     Hips  . Esophagitis   . Lumbar radiculopathy   . Hypercalcemia   . Obesity   . Arthritis     osteoarthritis  . CHF (congestive heart failure) ([place])     SURGICAL HISTORY: Past Surgical History  Procedure Laterality Date  . Coronary angioplasty with stent placement      SOCIAL HISTORY: Social History   Social History  . Marital Status: Married    Spouse Name: N/A  . Number of Children: N/A  . Years of Education: N/A   Occupational History  . Not on file.   Social  History Main Topics  . Smoking status: Never Smoker   . Smokeless tobacco: Never Used  . Alcohol Use: No  . Drug Use: No  . Sexual Activity: Yes    Birth Control/ Protection: Post-menopausal   Other Topics Concern  . Not on file   Social History Narrative    FAMILY HISTORY: Family History  Problem Relation Age of Onset  . Heart disease Mother   . Kidney failure Mother   . Hypertension Mother   . Diabetes Mother   . Heart attack Father   . Hypertension Father   . Thyroid disease Father   . Hypertension Sister   . Diabetes Sister   . Thyroid disease Daughter   . Thyroid disease Daughter     ALLERGIES:  is allergic to latex and sulfa antibiotics.  MEDICATIONS:  Current Outpatient Prescriptions  Medication Sig Dispense Refill  . aspirin 81 MG tablet Take 81 mg by mouth every morning.     [name] atorvastatin (LIPITOR) 80 MG tablet Take 80 mg by mouth daily.    . clopidogrel (PLAVIX) 75 MG tablet Take 75 mg by mouth every morning.     . furosemide (LASIX) 40 MG tablet Take 1 tablet (40 mg total) by mouth daily. 60 tablet 1  . lansoprazole (PREVACID) 30 MG capsule TAKE ONE CAPSULE BY MOUTH EVERY DAY 30 capsule 5  . levothyroxine (SYNTHROID, LEVOTHROID) 100 MCG tablet Take  100 mcg by mouth daily before breakfast.    . metoprolol (LOPRESSOR) 50 MG tablet Take 50 mg by mouth daily.    [name] olmesartan (BENICAR) 40 MG tablet Take 1 tablet (40 mg total) by mouth daily. 90 tablet 1  . raloxifene (EVISTA) 60 MG tablet TAKE ONE TABLET BY MOUTH EVERY DAY 30 tablet 0  . potassium chloride 20 MEQ TBCR Take 20 mEq by mouth daily. (Patient not taking: Reported on 12/12/2014) 30 tablet 1   No current facility-administered medications for this visit.      [name]  PHYSICAL EXAMINATION: ECOG PERFORMANCE STATUS: 1 - Symptomatic but completely ambulatory  Filed Vitals:   12/12/14 0912  BP: 168/83  Pulse: 70  Temp: 97.7 F (36.5 C)  Resp: 18   There were no vitals filed for this  visit.  GENERAL: Well-nourished well-developed; Alert, no distress and comfortable. Obese. Puffy face. EYES: Positive for pallor. OROPHARYNX: no thrush or ulceration; poor dentition. NECK: supple, no masses felt LYMPH:  no palpable lymphadenopathy in the cervical, axillary or inguinal regions LUNGS: clear to auscultation and  No wheeze or crackles HEART/CVS: regular rate & rhythm and no murmurs; 1+ bilateral lower extremity edema. ABDOMEN: abdomen soft, non-tender and normal bowel sounds Musculoskeletal:no cyanosis of digits and no clubbing  PSYCH: alert & oriented x 3 with fluent speech NEURO: no focal motor/sensory deficits SKIN:  no rashes or significant lesions  LABORATORY DATA:  I have reviewed the data as listed Lab Results  Component Value Date   WBC 5.6 11/25/2014   HGB 8.3* 08/09/2014   HCT 25.1* 11/25/2014   MCV 86.5 08/09/2014   PLT 213 08/09/2014    Recent Labs  08/07/14 1955 08/10/14 0348 11/25/14 1108  NA 124* 128* 136  K 4.0 3.2* 4.7  CL 92* 90* 95*  CO2 25 31 25   GLUCOSE 143* 106* 118*  BUN 13 13 15   CREATININE 0.80 0.80 1.09*  CALCIUM 8.5* 8.2* 9.5  GFRNONAA >60 >60 53*  GFRAA >60 >60 61  PROT  --   --  6.3  ALBUMIN  --   --  4.4  AST  --   --  26  ALT  --   --  20  ALKPHOS  --   --  78  BILITOT  --   --  0.6    RADIOGRAPHIC STUDIES: I have personally reviewed the radiological images as listed and agreed with the findings in the report. No results found.  ASSESSMENT & PLAN:  # Normocytic Anemia- unclear etiology. Symptomatic/hemoglobin 8. I suspect patient has iron deficiency anemia- as her ferritin was low/8 approximately 2 years ago. Check CBC; LDH; haptoglobin; reticulocyte count; SPEP; kappa lambda light chain ratio; [id number] folic acid. Also check stool cards. Patient might need a capsule study.  # I recommend IV iron infusion as patient is intolerant to by mouth iron.  # Recommend follow-up on a monthly basis for labs/ follow-up with me in  approximately 3 months with labs.  The above plan of care was discussed with the patient in detail. She agrees.  All questions were answered. The patient knows to call the clinic with any problems, questions or concerns.  Thank you Ms.[name]/Dr.Crissman for allowing me to participate in the care of your pleasant patient. Please do not hesitate to contact me if any questions or concerns.  I spent 30  minutes counseling the patient face to face. The total time spent in the appointment was  45 minutes and more  than 50% was on counseling.     Cammie Sickle, MD 12/12/2014 9:47 AM

## 2014-12-14 ENCOUNTER — Encounter: Payer: Self-pay | Admitting: Family

## 2014-12-14 ENCOUNTER — Ambulatory Visit: Payer: PRIVATE HEALTH INSURANCE | Attending: Family | Admitting: Family

## 2014-12-14 VITALS — BP 142/70 | HR 67 | Resp 20 | Ht 62.0 in | Wt 217.0 lb

## 2014-12-14 DIAGNOSIS — Z79899 Other long term (current) drug therapy: Secondary | ICD-10-CM | POA: Insufficient documentation

## 2014-12-14 DIAGNOSIS — D649 Anemia, unspecified: Secondary | ICD-10-CM | POA: Insufficient documentation

## 2014-12-14 DIAGNOSIS — I2510 Atherosclerotic heart disease of native coronary artery without angina pectoris: Secondary | ICD-10-CM | POA: Diagnosis not present

## 2014-12-14 DIAGNOSIS — E079 Disorder of thyroid, unspecified: Secondary | ICD-10-CM | POA: Diagnosis not present

## 2014-12-14 DIAGNOSIS — E119 Type 2 diabetes mellitus without complications: Secondary | ICD-10-CM | POA: Diagnosis not present

## 2014-12-14 DIAGNOSIS — I5032 Chronic diastolic (congestive) heart failure: Secondary | ICD-10-CM | POA: Diagnosis not present

## 2014-12-14 DIAGNOSIS — D500 Iron deficiency anemia secondary to blood loss (chronic): Secondary | ICD-10-CM

## 2014-12-14 DIAGNOSIS — Z7982 Long term (current) use of aspirin: Secondary | ICD-10-CM | POA: Diagnosis not present

## 2014-12-14 DIAGNOSIS — E7800 Pure hypercholesterolemia, unspecified: Secondary | ICD-10-CM | POA: Insufficient documentation

## 2014-12-14 DIAGNOSIS — K219 Gastro-esophageal reflux disease without esophagitis: Secondary | ICD-10-CM | POA: Insufficient documentation

## 2014-12-14 DIAGNOSIS — Z6839 Body mass index (BMI) 39.0-39.9, adult: Secondary | ICD-10-CM | POA: Insufficient documentation

## 2014-12-14 DIAGNOSIS — E669 Obesity, unspecified: Secondary | ICD-10-CM | POA: Diagnosis not present

## 2014-12-14 DIAGNOSIS — I10 Essential (primary) hypertension: Secondary | ICD-10-CM | POA: Insufficient documentation

## 2014-12-14 MED ORDER — POTASSIUM CHLORIDE ER 20 MEQ PO TBCR: 20.0000 meq | EXTENDED_RELEASE_TABLET | Freq: Every day | ORAL | Status: DC

## 2014-12-14 MED ORDER — FUROSEMIDE 40 MG PO TABS: 40.0000 mg | ORAL_TABLET | Freq: Every day | ORAL | Status: DC

## 2014-12-14 NOTE — Patient Instructions (Signed)
Continue weighing daily and call for an overnight weight gain of > 2 pounds or a weekly weight gain of >5 pounds. 

## 2014-12-14 NOTE — Progress Notes (Signed)
Subjective:    Patient ID: Erica Larsen, female    DOB: 1950-02-29, 64 y.o.   MRN: [id number]  Congestive Heart Failure Presents for follow-up visit. The disease course has been stable. Associated symptoms include edema, fatigue, palpitations and shortness of breath. Pertinent negatives include no abdominal pain, chest pain, chest pressure, orthopnea or paroxysmal nocturnal dyspnea. The symptoms have been stable. Past treatments include beta blockers, salt and fluid restriction and angiotensin receptor blockers. The treatment provided moderate relief. Compliance with prior treatments has been good. Her past medical history is significant for CAD, DM and HTN. She has multiple 1st degree relatives with heart disease. Compliance with total regimen is 76-100%.  Other This is a recurrent (edema) problem. The current episode started more than 1 year ago. The problem occurs daily. The problem has been unchanged. Associated symptoms include coughing (dry) and fatigue. Pertinent negatives include no abdominal pain, chest pain, congestion, headaches, neck pain or sore throat. The symptoms are aggravated by walking and standing. She has tried position changes for the symptoms. The treatment provided moderate relief.    Past Medical History  Diagnosis Date  . Hypertension   . Diabetes mellitus without complication ([place])   . High cholesterol   . Thyroid disease   . GERD (gastroesophageal reflux disease)   . Coronary artery disease   . Diverticulosis   . Osteopenia     Hips  . Esophagitis   . Lumbar radiculopathy   . Hypercalcemia   . Obesity   . Arthritis     osteoarthritis  . CHF (congestive heart failure) [facility])     Past Surgical History  Procedure Laterality Date  . Coronary angioplasty with stent placement      Family History  Problem Relation Age of Onset  . Heart disease Mother   . Kidney failure Mother   . Hypertension Mother   . Diabetes Mother   . Heart attack Father   .  Hypertension Father   . Thyroid disease Father   . Hypertension Sister   . Diabetes Sister   . Thyroid disease Daughter   . Thyroid disease Daughter     Social History  Substance Use Topics  . Smoking status: Never Smoker   . Smokeless tobacco: Never Used  . Alcohol Use: No    Allergies  Allergen Reactions  . Latex   . Sulfa Antibiotics     Prior to Admission medications   Medication Sig Start Date End Date Taking? Authorizing Provider  aspirin 81 MG tablet Take 81 mg by mouth every morning.    Yes Historical Provider, MD  atorvastatin (LIPITOR) 80 MG tablet Take 80 mg by mouth daily.   Yes Historical Provider, MD  clopidogrel (PLAVIX) 75 MG tablet Take 75 mg by mouth every morning.    Yes Historical Provider, MD  furosemide (LASIX) 40 MG tablet Take 1 tablet (40 mg total) by mouth daily. 12/14/14  Yes Alisa Graff, FNP  lansoprazole (PREVACID) 30 MG capsule TAKE ONE CAPSULE BY MOUTH EVERY DAY 07/26/14  Yes Kathrine Haddock, NP  levothyroxine (SYNTHROID, LEVOTHROID) 100 MCG tablet Take 100 mcg by mouth daily before breakfast.   Yes Historical Provider, MD  metoprolol (LOPRESSOR) 50 MG tablet Take 50 mg by mouth daily.   Yes Historical Provider, MD  olmesartan (BENICAR) 40 MG tablet Take 1 tablet (40 mg total) by mouth daily. 11/25/14  Yes Kathrine Haddock, NP  raloxifene (EVISTA) 60 MG tablet TAKE ONE TABLET BY MOUTH EVERY DAY 11/25/14  Yes Kathrine Haddock, NP  Potassium Chloride ER 20 MEQ TBCR Take 20 mEq by mouth daily. 12/14/14   Alisa Graff, FNP     Review of Systems  Constitutional: Positive for fatigue. Negative for appetite change.  HENT: Negative for congestion, postnasal drip and sore throat.   Eyes: Positive for discharge (clear discharge). Negative for pain.  Respiratory: Positive for cough (dry) and shortness of breath. Negative for chest tightness.   Cardiovascular: Positive for palpitations and leg swelling. Negative for chest pain.  Gastrointestinal: Negative for  abdominal pain and abdominal distention.  Endocrine: Negative.   Genitourinary: Negative.   Musculoskeletal: Negative for back pain and neck pain.  Skin: Negative.   Allergic/Immunologic: Negative.   Neurological: Negative for dizziness, light-headedness and headaches.  Hematological: Negative for adenopathy. Bruises/bleeds easily.  Psychiatric/Behavioral: Negative for sleep disturbance (sleeping on 1 pillow) and dysphoric mood. The patient is not nervous/anxious.        Objective:   Physical Exam  Constitutional: She is oriented to person, place, and time. She appears well-developed and well-nourished.  HENT:  Head: Normocephalic and atraumatic.  Eyes: Conjunctivae are normal. Pupils are equal, round, and reactive to light.  Neck: Normal range of motion. Neck supple.  Cardiovascular: Normal rate and regular rhythm.   Pulmonary/Chest: Effort normal. She has no wheezes. She has no rales.  Abdominal: Soft. She exhibits no distension. There is no tenderness.  Musculoskeletal: She exhibits edema (trace amount bilateral ankles). She exhibits no tenderness.  Neurological: She is alert and oriented to person, place, and time.  Skin: Skin is warm and dry.  Psychiatric: She has a normal mood and affect. Her behavior is normal. Thought content normal.  Nursing note and vitals reviewed.  BP 142/70 mmHg  Pulse 67  Resp 20  Ht 5\' 2"  (1.575 m)  Wt 217 lb (98.431 kg)  BMI 39.68 kg/m2  SpO2 100%        Assessment & Plan:  1: Chronic heart failure with preserved ejection fraction- Patient presents with shortness of breath and fatigue upon exertion. She said that she was a little tired and short of breath upon walking into the office today. Once she sits down to rest for a few minutes, her symptoms resolve. She continues to weigh herself daily and denies any overnight weight gain. She has noticed a gradual weight gain but says that it's because she is eating more. By our scale, she has gained  8 pounds since June 2016. Reminded her to call for an overnight weight gain of >2 pounds or a weekly weight gain of >5 pounds. She is not adding any salt to her food and has noticed that some foods now taste salty to her.  2: HTN- Blood pressure slightly elevated upon presentation to the office but had come down nicely at the end of the visit. Continue medications at this time. 3: Anemia- Patient had an appointment with a hematologist and will begin IV iron therapy beginning next week. Hopefully her energy level will improve as her hemoglobin is corrected.  Return in 3 months or sooner for any questions/problems before then.

## 2014-12-23 ENCOUNTER — Inpatient Hospital Stay: Payer: PRIVATE HEALTH INSURANCE | Attending: Internal Medicine

## 2014-12-23 VITALS — BP 177/72 | HR 70 | Resp 20

## 2014-12-23 DIAGNOSIS — D500 Iron deficiency anemia secondary to blood loss (chronic): Secondary | ICD-10-CM | POA: Diagnosis not present

## 2014-12-23 MED ORDER — FERUMOXYTOL INJECTION 510 MG/17 ML
510.0000 mg | Freq: Once | INTRAVENOUS | Status: AC
  Administered 2014-12-23: 510 mg via INTRAVENOUS
  Filled 2014-12-23: qty 17

## 2014-12-23 MED ORDER — SODIUM CHLORIDE 0.9 % IV SOLN
INTRAVENOUS | Status: DC
  Administered 2014-12-23: 14:00:00 via INTRAVENOUS
  Filled 2014-12-23: qty 1000

## 2014-12-27 ENCOUNTER — Other Ambulatory Visit: Payer: Self-pay | Admitting: Unknown Physician Specialty

## 2014-12-30 ENCOUNTER — Inpatient Hospital Stay: Payer: PRIVATE HEALTH INSURANCE

## 2014-12-30 VITALS — BP 158/76 | HR 67 | Temp 97.9°F | Resp 20

## 2014-12-30 DIAGNOSIS — D500 Iron deficiency anemia secondary to blood loss (chronic): Secondary | ICD-10-CM | POA: Diagnosis not present

## 2014-12-30 MED ORDER — SODIUM CHLORIDE 0.9 % IV SOLN
510.0000 mg | Freq: Once | INTRAVENOUS | Status: AC
  Administered 2014-12-30: 510 mg via INTRAVENOUS
  Filled 2014-12-30: qty 17

## 2014-12-30 MED ORDER — SODIUM CHLORIDE 0.9 % IV SOLN
INTRAVENOUS | Status: DC
Start: 2014-12-30 — End: 2014-12-30
  Administered 2014-12-30: 14:00:00 via INTRAVENOUS
  Filled 2014-12-30: qty 1000

## 2015-01-11 ENCOUNTER — Other Ambulatory Visit: Payer: Self-pay | Admitting: *Deleted

## 2015-01-11 ENCOUNTER — Inpatient Hospital Stay: Payer: PRIVATE HEALTH INSURANCE

## 2015-01-11 DIAGNOSIS — D500 Iron deficiency anemia secondary to blood loss (chronic): Secondary | ICD-10-CM | POA: Diagnosis not present

## 2015-01-11 DIAGNOSIS — D519 Vitamin B12 deficiency anemia, unspecified: Secondary | ICD-10-CM

## 2015-01-11 DIAGNOSIS — D649 Anemia, unspecified: Secondary | ICD-10-CM

## 2015-01-11 LAB — VITAMIN B12: VITAMIN B 12: 197 pg/mL (ref 180–914)

## 2015-01-11 LAB — CBC WITH DIFFERENTIAL/PLATELET
BASOS ABS: 0 10*3/uL (ref 0–0.1)
BASOS PCT: 0 %
EOS ABS: 0.2 10*3/uL (ref 0–0.7)
EOS PCT: 3 %
HCT: 36.4 % (ref 35.0–47.0)
Hemoglobin: 11.4 g/dL — ABNORMAL LOW (ref 12.0–16.0)
LYMPHS PCT: 16 %
Lymphs Abs: 0.9 10*3/uL — ABNORMAL LOW (ref 1.0–3.6)
MCH: 28.6 pg (ref 26.0–34.0)
MCHC: 31.3 g/dL — ABNORMAL LOW (ref 32.0–36.0)
MCV: 91.4 fL (ref 80.0–100.0)
MONO ABS: 0.6 10*3/uL (ref 0.2–0.9)
Monocytes Relative: 11 %
Neutro Abs: 3.8 10*3/uL (ref 1.4–6.5)
Neutrophils Relative %: 70 %
PLATELETS: 240 10*3/uL (ref 150–440)
RBC: 3.98 MIL/uL (ref 3.80–5.20)
RDW: 22.5 % — AB (ref 11.5–14.5)
WBC: 5.5 10*3/uL (ref 3.6–11.0)

## 2015-01-11 LAB — FERRITIN: FERRITIN: 455 ng/mL — AB (ref 11–307)

## 2015-01-11 LAB — IRON AND TIBC
IRON: 85 ug/dL (ref 28–170)
SATURATION RATIOS: 24 % (ref 10.4–31.8)
TIBC: 353 ug/dL (ref 250–450)
UIBC: 268 ug/dL

## 2015-01-11 LAB — URINALYSIS COMPLETE WITH MICROSCOPIC (ARMC ONLY)
BILIRUBIN URINE: NEGATIVE
GLUCOSE, UA: NEGATIVE mg/dL
Hgb urine dipstick: NEGATIVE
KETONES UR: NEGATIVE mg/dL
Leukocytes, UA: NEGATIVE
Nitrite: NEGATIVE
Protein, ur: NEGATIVE mg/dL
Specific Gravity, Urine: 1.018 (ref 1.005–1.030)
pH: 7 (ref 5.0–8.0)

## 2015-01-11 LAB — RETICULOCYTES
RBC.: 4 MIL/uL (ref 3.80–5.20)
RETIC COUNT ABSOLUTE: 76 10*3/uL (ref 19.0–183.0)
Retic Ct Pct: 1.9 % (ref 0.4–3.1)

## 2015-01-11 LAB — LACTATE DEHYDROGENASE: LDH: 198 U/L — AB (ref 98–192)

## 2015-01-11 LAB — FOLATE: FOLATE: 12.5 ng/mL (ref >5.9)

## 2015-01-12 LAB — IMMUNOFIXATION ELECTROPHORESIS
IGA: 100 mg/dL (ref 87–352)
IGG (IMMUNOGLOBIN G), SERUM: 964 mg/dL (ref 700–1600)
IgM, Serum: 7 mg/dL — ABNORMAL LOW (ref 26–217)
Total Protein ELP: 6.4 g/dL (ref 6.0–8.5)

## 2015-01-13 LAB — SOLUBLE TRANSFERRIN RECEPTOR: Transferrin Receptor: 26.6 nmol/L (ref 12.2–27.3)

## 2015-01-13 LAB — KAPPA/LAMBDA LIGHT CHAINS
Kappa free light chain: 17.74 mg/L (ref 3.30–19.40)
Kappa, lambda light chain ratio: 1.13 (ref 0.26–1.65)
Lambda free light chains: 15.73 mg/L (ref 5.71–26.30)

## 2015-01-13 LAB — HAPTOGLOBIN: Haptoglobin: 200 mg/dL (ref 34–200)

## 2015-01-30 ENCOUNTER — Other Ambulatory Visit: Payer: Self-pay | Admitting: Unknown Physician Specialty

## 2015-02-06 DIAGNOSIS — I340 Nonrheumatic mitral (valve) insufficiency: Secondary | ICD-10-CM | POA: Diagnosis not present

## 2015-02-06 DIAGNOSIS — I10 Essential (primary) hypertension: Secondary | ICD-10-CM | POA: Diagnosis not present

## 2015-02-06 DIAGNOSIS — I2510 Atherosclerotic heart disease of native coronary artery without angina pectoris: Secondary | ICD-10-CM | POA: Diagnosis not present

## 2015-02-06 DIAGNOSIS — I420 Dilated cardiomyopathy: Secondary | ICD-10-CM | POA: Diagnosis not present

## 2015-02-06 DIAGNOSIS — E782 Mixed hyperlipidemia: Secondary | ICD-10-CM | POA: Diagnosis not present

## 2015-02-07 DIAGNOSIS — D500 Iron deficiency anemia secondary to blood loss (chronic): Secondary | ICD-10-CM | POA: Insufficient documentation

## 2015-02-09 DIAGNOSIS — D500 Iron deficiency anemia secondary to blood loss (chronic): Secondary | ICD-10-CM | POA: Diagnosis not present

## 2015-02-10 ENCOUNTER — Inpatient Hospital Stay: Payer: PRIVATE HEALTH INSURANCE | Attending: Internal Medicine

## 2015-02-10 ENCOUNTER — Telehealth: Payer: Self-pay | Admitting: *Deleted

## 2015-02-10 DIAGNOSIS — D500 Iron deficiency anemia secondary to blood loss (chronic): Secondary | ICD-10-CM | POA: Diagnosis not present

## 2015-02-10 LAB — OCCULT BLOOD X 1 CARD TO LAB, STOOL
Fecal Occult Bld: NEGATIVE
Fecal Occult Bld: NEGATIVE

## 2015-02-10 LAB — CBC WITH DIFFERENTIAL/PLATELET
BASOS ABS: 0 10*3/uL (ref 0–0.1)
BASOS PCT: 0 %
EOS PCT: 4 %
Eosinophils Absolute: 0.2 10*3/uL (ref 0–0.7)
HEMATOCRIT: 37.6 % (ref 35.0–47.0)
Hemoglobin: 12.1 g/dL (ref 12.0–16.0)
Lymphocytes Relative: 30 %
Lymphs Abs: 1.6 10*3/uL (ref 1.0–3.6)
MCH: 29.3 pg (ref 26.0–34.0)
MCHC: 32.3 g/dL (ref 32.0–36.0)
MCV: 90.8 fL (ref 80.0–100.0)
MONO ABS: 0.7 10*3/uL (ref 0.2–0.9)
MONOS PCT: 13 %
NEUTROS ABS: 2.8 10*3/uL (ref 1.4–6.5)
Neutrophils Relative %: 53 %
PLATELETS: 190 10*3/uL (ref 150–440)
RBC: 4.14 MIL/uL (ref 3.80–5.20)
RDW: 20.4 % — AB (ref 11.5–14.5)
WBC: 5.3 10*3/uL (ref 3.6–11.0)

## 2015-02-10 NOTE — Telephone Encounter (Signed)
Attempted to contact patient. Results of hgb improved. Occult negative x 2. Left msg for patient that results are improved and no blood in stool. She can contact our office for any specific questions or concerns.

## 2015-02-15 ENCOUNTER — Other Ambulatory Visit: Payer: Self-pay | Admitting: Unknown Physician Specialty

## 2015-02-19 DIAGNOSIS — N189 Chronic kidney disease, unspecified: Secondary | ICD-10-CM

## 2015-02-19 DIAGNOSIS — D638 Anemia in other chronic diseases classified elsewhere: Secondary | ICD-10-CM

## 2015-02-19 DIAGNOSIS — D649 Anemia, unspecified: Secondary | ICD-10-CM

## 2015-02-19 HISTORY — DX: Anemia in other chronic diseases classified elsewhere: D63.8

## 2015-02-19 HISTORY — DX: Chronic kidney disease, unspecified: N18.9

## 2015-02-19 HISTORY — DX: Anemia, unspecified: D64.9

## 2015-03-08 ENCOUNTER — Ambulatory Visit (INDEPENDENT_AMBULATORY_CARE_PROVIDER_SITE_OTHER): Payer: PRIVATE HEALTH INSURANCE | Admitting: Unknown Physician Specialty

## 2015-03-08 ENCOUNTER — Encounter: Payer: Self-pay | Admitting: Unknown Physician Specialty

## 2015-03-08 VITALS — BP 162/75 | HR 69 | Temp 98.1°F | Ht 62.0 in | Wt 211.6 lb

## 2015-03-08 DIAGNOSIS — E1169 Type 2 diabetes mellitus with other specified complication: Secondary | ICD-10-CM | POA: Insufficient documentation

## 2015-03-08 DIAGNOSIS — E785 Hyperlipidemia, unspecified: Secondary | ICD-10-CM

## 2015-03-08 DIAGNOSIS — E1122 Type 2 diabetes mellitus with diabetic chronic kidney disease: Secondary | ICD-10-CM

## 2015-03-08 DIAGNOSIS — I5032 Chronic diastolic (congestive) heart failure: Secondary | ICD-10-CM

## 2015-03-08 DIAGNOSIS — N183 Chronic kidney disease, stage 3 unspecified: Secondary | ICD-10-CM

## 2015-03-08 DIAGNOSIS — Z23 Encounter for immunization: Secondary | ICD-10-CM | POA: Diagnosis not present

## 2015-03-08 DIAGNOSIS — I10 Essential (primary) hypertension: Secondary | ICD-10-CM

## 2015-03-08 LAB — LIPID PANEL PICCOLO, WAIVED
CHOL/HDL RATIO PICCOLO,WAIVE: 1.8 mg/dL
CHOLESTEROL PICCOLO, WAIVED: 167 mg/dL (ref <200)
HDL Chol Piccolo, Waived: 92 mg/dL (ref >59)
LDL Chol Calc Piccolo Waived: 52 mg/dL (ref <100)
TRIGLYCERIDES PICCOLO,WAIVED: 115 mg/dL (ref <150)
VLDL Chol Calc Piccolo,Waive: 23 mg/dL (ref <30)

## 2015-03-08 LAB — BAYER DCA HB A1C WAIVED: HB A1C (BAYER DCA - WAIVED): 6.1 % (ref <7.0)

## 2015-03-08 LAB — MICROALBUMIN, URINE WAIVED
Creatinine, Urine Waived: 50 mg/dL (ref 10–300)
Microalb, Ur Waived: 10 mg/L (ref 0–19)
Microalb/Creat Ratio: <30 mg/g (ref <30)

## 2015-03-08 MED ORDER — LANSOPRAZOLE 30 MG PO CPDR: 30.0000 mg | DELAYED_RELEASE_CAPSULE | Freq: Every day | ORAL | Status: DC

## 2015-03-08 MED ORDER — RALOXIFENE HCL 60 MG PO TABS: 60.0000 mg | ORAL_TABLET | Freq: Every day | ORAL | Status: DC

## 2015-03-08 MED ORDER — AMLODIPINE BESYLATE 2.5 MG PO TABS: 2.5000 mg | ORAL_TABLET | Freq: Every day | ORAL | Status: DC

## 2015-03-08 NOTE — Progress Notes (Signed)
BP 162/75 mmHg  Pulse 69  Temp(Src) 98.1 F (36.7 C)  Ht 5\' 2"  (1.575 m)  Wt 211 lb 9.6 oz (95.981 kg)  BMI 38.69 kg/m2  SpO2 98%   Subjective:    Patient ID: Erica Larsen, female    DOB: 1950/11/26, 65 y.o.   MRN: [id number]  HPI: Erica Larsen is a 65 y.o. female  Chief Complaint  Patient presents with  . Diabetes    pt states it has been years since her last eye exam  . Hyperlipidemia  . Hypertension   Diabetes:  Taken off Actos last visit due to heart failure.    No hypoglycemic episodes No hyperglycemic episodes Feet problems:swelling.  Going to the heart failure clinic Blood Sugars averaging: 97-125  Hypertension:  Using medications without difficulty Average home BPs not checking   Using medication without problems or lightheadedness No chest pain with exertion or shortness of breath:  Elevated Cholesterol: Using medications without problems: No Muscle aches:  Diet compliance:good Exercise:   Anemia Per hematology. Getting IV iron infusions  Heart Failure Reviewed notes from the heart failure clinic.  BP high there but came down after she had been there a little while.    Relevant past medical, surgical, family and social history reviewed and updated as indicated. Interim medical history since our last visit reviewed. Allergies and medications reviewed and updated.  Review of Systems  Per HPI unless specifically indicated above     Objective:    BP 162/75 mmHg  Pulse 69  Temp(Src) 98.1 F (36.7 C)  Ht 5\' 2"  (1.575 m)  Wt 211 lb 9.6 oz (95.981 kg)  BMI 38.69 kg/m2  SpO2 98%  Wt Readings from Last 3 Encounters:  03/08/15 211 lb 9.6 oz (95.981 kg)  12/14/14 217 lb (98.431 kg)  11/25/14 222 lb 12.8 oz (101.061 kg)    Physical Exam  Constitutional: She is oriented to person, place, and time. She appears well-developed and well-nourished. No distress.  HENT:  Head: Normocephalic and atraumatic.  Eyes: Conjunctivae and lids are normal.  Right eye exhibits no discharge. Left eye exhibits no discharge. No scleral icterus.  Neck: Normal range of motion. Neck supple. No JVD present. Carotid bruit is not present.  Cardiovascular: Normal rate, regular rhythm and normal heart sounds.   Pulmonary/Chest: Effort normal and breath sounds normal.  Abdominal: Normal appearance. There is no splenomegaly or hepatomegaly.  Musculoskeletal: Normal range of motion.  Neurological: She is alert and oriented to person, place, and time.  Skin: Skin is warm, dry and intact. No rash noted. No pallor.  Psychiatric: She has a normal mood and affect. Her behavior is normal. Judgment and thought content normal.    Results for orders placed or performed in visit on 03/08/15  Lipid Panel Piccolo, [employer]  Result Value Ref Range   Cholesterol Piccolo, Waived 167 <200 mg/dL   HDL Chol Piccolo, Waived 92 >59 mg/dL   Triglycerides Piccolo,Waived 115 <150 mg/dL   Chol/HDL Ratio Piccolo,Waive 1.8 mg/dL   LDL Chol Calc Piccolo Waived 52 <100 mg/dL   VLDL Chol Calc Piccolo,Waive 23 <30 mg/dL  Microalbumin, Urine Waived  Result Value Ref Range   Microalb, Ur Waived 10 0 - 19 mg/L   Creatinine, Urine Waived 50 10 - 300 mg/dL   Microalb/Creat Ratio <30 <30 mg/g  Bayer DCA Hb A1c Waived  Result Value Ref Range   Bayer DCA Hb A1c Waived 6.1 <7.0 %      Assessment &  Plan:   Problem List Items Addressed This Visit      Unprioritized   Chronic diastolic heart failure (HCC)   Relevant Medications   amLODipine (NORVASC) 2.5 MG tablet   Other Relevant Orders   TSH   Diabetes mellitus with chronic kidney disease (HCC)    Hgb A1C is 6.1 not on any medication.  No need to add more      Relevant Orders   Lipid Panel Piccolo, Waived (Completed)   Bayer DCA Hb A1c Waived (Completed)   Comprehensive metabolic panel   Essential hypertension    Add Amlodipine 2.5 mg QD.  Pt ed on ankle welling which is already a problem.  Will f/u at the heart failure  clinic      Relevant Medications   amLODipine (NORVASC) 2.5 MG tablet   Other Relevant Orders   Lipid Panel Piccolo, Waived (Completed)   Microalbumin, Urine Waived (Completed)   Uric acid   Comprehensive metabolic panel   Hyperlipemia    Reviewed lipid panel.  LDL was 56.  Continue present medications.         Relevant Medications   amLODipine (NORVASC) 2.5 MG tablet    Other Visit Diagnoses    Need for pneumococcal vaccination    -  Primary    Relevant Orders    Pneumococcal polysaccharide vaccine 23-valent greater than or equal to 2yo subcutaneous/IM (Completed)        Follow up plan: Return in about 1 month (around 04/08/2015) for physical.

## 2015-03-08 NOTE — Assessment & Plan Note (Signed)
Add Amlodipine 2.5 mg QD.  Pt ed on ankle welling which is already a problem.  Will f/u at the heart failure clinic

## 2015-03-08 NOTE — Assessment & Plan Note (Signed)
Hgb A1C is 6.1 not on any medication.  No need to add more

## 2015-03-08 NOTE — Assessment & Plan Note (Signed)
Reviewed lipid panel.  LDL was 56.  Continue present medications.

## 2015-03-09 LAB — COMPREHENSIVE METABOLIC PANEL
ALT: 27 IU/L (ref 0–32)
AST: 29 IU/L (ref 0–40)
Albumin/Globulin Ratio: 1.7 (ref 1.1–2.5)
Albumin: 4.6 g/dL (ref 3.6–4.8)
Alkaline Phosphatase: 133 IU/L — ABNORMAL HIGH (ref 39–117)
BUN/Creatinine Ratio: 17 (ref 11–26)
BUN: 16 mg/dL (ref 8–27)
Bilirubin Total: 0.4 mg/dL (ref 0.0–1.2)
CALCIUM: 9.5 mg/dL (ref 8.7–10.3)
CO2: 26 mmol/L (ref 18–29)
CREATININE: 0.95 mg/dL (ref 0.57–1.00)
Chloride: 98 mmol/L (ref 96–106)
GFR calc Af Amer: 72 mL/min/{1.73_m2} (ref >59)
GFR, EST NON AFRICAN AMERICAN: 63 mL/min/{1.73_m2} (ref >59)
GLOBULIN, TOTAL: 2.7 g/dL (ref 1.5–4.5)
Glucose: 121 mg/dL — ABNORMAL HIGH (ref 65–99)
Potassium: 4.9 mmol/L (ref 3.5–5.2)
SODIUM: 141 mmol/L (ref 134–144)
Total Protein: 7.3 g/dL (ref 6.0–8.5)

## 2015-03-09 LAB — URIC ACID: Uric Acid: 6.6 mg/dL (ref 2.5–7.1)

## 2015-03-09 LAB — TSH: TSH: 4.32 u[IU]/mL (ref 0.450–4.500)

## 2015-03-15 ENCOUNTER — Inpatient Hospital Stay: Payer: PRIVATE HEALTH INSURANCE | Attending: Internal Medicine

## 2015-03-15 ENCOUNTER — Inpatient Hospital Stay (HOSPITAL_BASED_OUTPATIENT_CLINIC_OR_DEPARTMENT_OTHER): Payer: PRIVATE HEALTH INSURANCE | Admitting: Internal Medicine

## 2015-03-15 VITALS — BP 169/81 | HR 78 | Temp 96.2°F | Resp 18 | Ht 62.0 in | Wt 210.3 lb

## 2015-03-15 DIAGNOSIS — E119 Type 2 diabetes mellitus without complications: Secondary | ICD-10-CM | POA: Diagnosis not present

## 2015-03-15 DIAGNOSIS — Z8719 Personal history of other diseases of the digestive system: Secondary | ICD-10-CM | POA: Diagnosis not present

## 2015-03-15 DIAGNOSIS — I10 Essential (primary) hypertension: Secondary | ICD-10-CM | POA: Diagnosis not present

## 2015-03-15 DIAGNOSIS — M8580 Other specified disorders of bone density and structure, unspecified site: Secondary | ICD-10-CM | POA: Diagnosis not present

## 2015-03-15 DIAGNOSIS — D509 Iron deficiency anemia, unspecified: Secondary | ICD-10-CM | POA: Insufficient documentation

## 2015-03-15 DIAGNOSIS — M1990 Unspecified osteoarthritis, unspecified site: Secondary | ICD-10-CM

## 2015-03-15 DIAGNOSIS — D500 Iron deficiency anemia secondary to blood loss (chronic): Secondary | ICD-10-CM

## 2015-03-15 DIAGNOSIS — Z780 Asymptomatic menopausal state: Secondary | ICD-10-CM

## 2015-03-15 DIAGNOSIS — I509 Heart failure, unspecified: Secondary | ICD-10-CM | POA: Diagnosis not present

## 2015-03-15 DIAGNOSIS — R609 Edema, unspecified: Secondary | ICD-10-CM | POA: Diagnosis not present

## 2015-03-15 DIAGNOSIS — E669 Obesity, unspecified: Secondary | ICD-10-CM

## 2015-03-15 DIAGNOSIS — E039 Hypothyroidism, unspecified: Secondary | ICD-10-CM

## 2015-03-15 DIAGNOSIS — Z7901 Long term (current) use of anticoagulants: Secondary | ICD-10-CM

## 2015-03-15 DIAGNOSIS — I2510 Atherosclerotic heart disease of native coronary artery without angina pectoris: Secondary | ICD-10-CM | POA: Diagnosis not present

## 2015-03-15 DIAGNOSIS — Z7982 Long term (current) use of aspirin: Secondary | ICD-10-CM | POA: Diagnosis not present

## 2015-03-15 DIAGNOSIS — E7800 Pure hypercholesterolemia, unspecified: Secondary | ICD-10-CM

## 2015-03-15 DIAGNOSIS — Z79899 Other long term (current) drug therapy: Secondary | ICD-10-CM

## 2015-03-15 DIAGNOSIS — K219 Gastro-esophageal reflux disease without esophagitis: Secondary | ICD-10-CM

## 2015-03-15 LAB — CBC WITH DIFFERENTIAL/PLATELET
Basophils Absolute: 0 10*3/uL (ref 0–0.1)
Basophils Relative: 0 %
EOS ABS: 0.4 10*3/uL (ref 0–0.7)
EOS PCT: 5 %
HCT: 38.8 % (ref 35.0–47.0)
Hemoglobin: 13 g/dL (ref 12.0–16.0)
LYMPHS ABS: 1.4 10*3/uL (ref 1.0–3.6)
Lymphocytes Relative: 18 %
MCH: 30.9 pg (ref 26.0–34.0)
MCHC: 33.6 g/dL (ref 32.0–36.0)
MCV: 91.9 fL (ref 80.0–100.0)
MONO ABS: 0.8 10*3/uL (ref 0.2–0.9)
Monocytes Relative: 10 %
Neutro Abs: 5.3 10*3/uL (ref 1.4–6.5)
Neutrophils Relative %: 67 %
PLATELETS: 198 10*3/uL (ref 150–440)
RBC: 4.22 MIL/uL (ref 3.80–5.20)
RDW: 17.7 % — AB (ref 11.5–14.5)
WBC: 7.8 10*3/uL (ref 3.6–11.0)

## 2015-03-15 LAB — FERRITIN: FERRITIN: 255 ng/mL (ref 11–307)

## 2015-03-15 NOTE — Progress Notes (Signed)
[place] NOTE  Patient Care Team: Kathrine Haddock, NP as PCP - General (Nurse Practitioner) Alisa Graff, FNP as Nurse Practitioner (Family Medicine) Dionisio David, MD as Consulting Physician (Cardiology)  CHIEF COMPLAINTS/PURPOSE OF CONSULTATION:   # 2014- Iron def Anemia-  [Sep 2014 EGD/COLO- NEG/ferritin 6; Dr.Wohl]. November 2016 status post IV iron  # Hx CHF/CAD- asprin-plavix  HISTORY OF PRESENTING ILLNESS:  Erica Larsen 65 y.o.  female patient with above history of iron deficiency anemia of unclear etiology is here status post IV iron in November 2016 is here for follow-up.   Patient's energy levels are significantly improved. She is not as tired as before.  She continues to deny any blood in stools or black stools.  Continues to have chronic swelling which she attributes to her history of congestive heart failure.   ROS: A complete 10 point review of system is done which is negative except mentioned above in history of present illness  MEDICAL HISTORY:  Past Medical History  Diagnosis Date  . Hypertension   . Diabetes mellitus without complication ([place])   . High cholesterol   . Thyroid disease   . GERD (gastroesophageal reflux disease)   . Coronary artery disease   . Diverticulosis   . Osteopenia     Hips  . Esophagitis   . Lumbar radiculopathy   . Hypercalcemia   . Obesity   . Arthritis     osteoarthritis  . CHF (congestive heart failure) ([place])     SURGICAL HISTORY: Past Surgical History  Procedure Laterality Date  . Coronary angioplasty with stent placement      SOCIAL HISTORY: Social History   Social History  . Marital Status: Married    Spouse Name: N/A  . Number of Children: N/A  . Years of Education: N/A   Occupational History  . Not on file.   Social History Main Topics  . Smoking status: Never Smoker   . Smokeless tobacco: Never Used  . Alcohol Use: No  . Drug Use: No  . Sexual Activity: Yes    Birth  Control/ Protection: Post-menopausal   Other Topics Concern  . Not on file   Social History Narrative    FAMILY HISTORY: Family History  Problem Relation Age of Onset  . Heart disease Mother   . Kidney failure Mother   . Hypertension Mother   . Diabetes Mother   . Heart attack Father   . Hypertension Father   . Thyroid disease Father   . Hypertension Sister   . Diabetes Sister   . Thyroid disease Daughter   . Thyroid disease Daughter     ALLERGIES:  is allergic to latex and sulfa antibiotics.  MEDICATIONS:  Current Outpatient Prescriptions  Medication Sig Dispense Refill  . amLODipine (NORVASC) 2.5 MG tablet Take 1 tablet (2.5 mg total) by mouth daily. 90 tablet 0  . aspirin 81 MG tablet Take 81 mg by mouth every morning.     [name] atorvastatin (LIPITOR) 80 MG tablet Take 80 mg by mouth daily.    . clopidogrel (PLAVIX) 75 MG tablet Take 75 mg by mouth every morning.     . furosemide (LASIX) 40 MG tablet Take 1 tablet (40 mg total) by mouth daily. 30 tablet 5  . lansoprazole (PREVACID) 30 MG capsule Take 1 capsule (30 mg total) by mouth daily at 12 noon. 90 capsule 1  . levothyroxine (SYNTHROID, LEVOTHROID) 100 MCG tablet Take 100 mcg by mouth daily before  breakfast.    . metoprolol (LOPRESSOR) 50 MG tablet Take 50 mg by mouth daily.    [name] olmesartan (BENICAR) 40 MG tablet Take 1 tablet (40 mg total) by mouth daily. 90 tablet 1  . Potassium Chloride ER 20 MEQ TBCR Take 20 mEq by mouth daily. 30 tablet 5  . raloxifene (EVISTA) 60 MG tablet Take 1 tablet (60 mg total) by mouth daily. 90 tablet 1   No current facility-administered medications for this visit.      [name]  PHYSICAL EXAMINATION: ECOG PERFORMANCE STATUS: 1 - Symptomatic but completely ambulatory  Filed Vitals:   03/15/15 1034  BP: 169/81  Pulse: 78  Temp: 96.2 F (35.7 C)  Resp: 18   Filed Weights   03/15/15 1034  Weight: 210 lb 5.1 oz (95.4 kg)    GENERAL: Well-nourished well-developed; Alert, no  distress and comfortable. Obese. Puffy face.   LABORATORY DATA:  I have reviewed the data as listed Lab Results  Component Value Date   WBC 7.8 03/15/2015   HGB 13.0 03/15/2015   HCT 38.8 03/15/2015   MCV 91.9 03/15/2015   PLT 198 03/15/2015    Recent Labs  08/10/14 0348 11/25/14 1108 03/08/15 1103  NA 128* 136 141  K 3.2* 4.7 4.9  CL 90* 95* 98  CO2 31 25 26   GLUCOSE 106* 118* 121*  BUN 13 15 16   CREATININE 0.80 1.09* 0.95  CALCIUM 8.2* 9.5 9.5  GFRNONAA >60 53* 63  GFRAA >60 61 72  PROT  --  6.3 7.3  ALBUMIN  --  4.4 4.6  AST  --  26 29  ALT  --  20 27  ALKPHOS  --  78 133*  BILITOT  --  0.6 0.4    RADIOGRAPHIC STUDIES: I have personally reviewed the radiological images as listed and agreed with the findings in the report. No results found.  ASSESSMENT & PLAN:  # Iron deficiency anemia- unclear etiology; question GI blood loss. EGD colonoscopy 2014 September negative for any source of bleeding. Patient might benefit from a capsule study. Patient was asked to talk to her GI doctor regarding a capsule study.  # Patient had significant benefit from IV iron; ferritin post IV iron was 445. I recommend Venofer 200 mg IV in approximately 4 months if needed. Patient will get CBC ferritin a day prior to my next visit.  # 15 minutes face-to-face with the patient discussing the above plan of care; more than 50% of time spent ocounseling and coordination.     Cammie Sickle, MD 03/15/2015 10:38 AM

## 2015-03-23 ENCOUNTER — Ambulatory Visit: Payer: PRIVATE HEALTH INSURANCE | Admitting: Family

## 2015-04-10 ENCOUNTER — Telehealth: Payer: Self-pay | Admitting: Family

## 2015-04-10 ENCOUNTER — Ambulatory Visit: Payer: PRIVATE HEALTH INSURANCE | Admitting: Family

## 2015-04-10 NOTE — Telephone Encounter (Signed)
Patient did not show for her appointment at the [facility] on 04/10/15. Will attempt to reschedule.

## 2015-05-03 ENCOUNTER — Telehealth: Payer: Self-pay

## 2015-05-03 NOTE — Telephone Encounter (Signed)
Left vm for pt to return my call to discuss scheduling a capsule study. Dr. Allen Norris review pt's chart and agreed she needs the study done.

## 2015-05-04 ENCOUNTER — Other Ambulatory Visit: Payer: Self-pay

## 2015-05-04 NOTE — Telephone Encounter (Signed)
Pt scheduled for a givens capsule study at [facility] on Wed, March 29th. Pt instructions have been mailed. Advised pt to call if she had any questions.

## 2015-05-10 ENCOUNTER — Ambulatory Visit (INDEPENDENT_AMBULATORY_CARE_PROVIDER_SITE_OTHER): Payer: PRIVATE HEALTH INSURANCE | Admitting: Unknown Physician Specialty

## 2015-05-10 ENCOUNTER — Encounter: Payer: Self-pay | Admitting: Unknown Physician Specialty

## 2015-05-10 VITALS — BP 144/78 | HR 72 | Temp 98.0°F | Ht 61.6 in | Wt 211.4 lb

## 2015-05-10 DIAGNOSIS — Z0000 Encounter for general adult medical examination without abnormal findings: Secondary | ICD-10-CM

## 2015-05-10 DIAGNOSIS — Z1231 Encounter for screening mammogram for malignant neoplasm of breast: Secondary | ICD-10-CM | POA: Diagnosis not present

## 2015-05-10 NOTE — Patient Instructions (Signed)
Please do call to schedule your mammogram; the number to schedule one at either [facility] or [facility] is [phone]   

## 2015-05-10 NOTE — Progress Notes (Signed)
BP 144/78 mmHg  Pulse 72  Temp(Src) 98 F (36.7 C)  Ht 5' 1.6" (1.565 m)  Wt 211 lb 6.4 oz (95.89 kg)  BMI 39.15 kg/m2  SpO2 97%   Subjective:    Patient ID: Erica Larsen, female    DOB: May 26, 1950, 65 y.o.   MRN: [id number]  HPI: Erica Larsen is a 65 y.o. female  Chief Complaint  Patient presents with  . Medicare Wellness   Functional Status Survey: Is the patient deaf or have difficulty hearing?: Yes (states she has trouble hearing small noises) Does the patient have difficulty seeing, even when wearing glasses/contacts?: Yes Does the patient have difficulty concentrating, remembering, or making decisions?: No Does the patient have difficulty walking or climbing stairs?: Yes (pt states she gets out of breath) Does the patient have difficulty dressing or bathing?: No Does the patient have difficulty doing errands alone such as visiting a doctor's office or shopping?: No  Fall Risk  05/10/2015 12/14/2014 08/17/2014  Falls in the past year? No No No   Depression screen [facility] 2/9 05/10/2015 12/14/2014 08/17/2014  Decreased Interest 0 0 0  Down, Depressed, Hopeless 0 0 0  PHQ - 2 Score 0 0 0    Family History  Problem Relation Age of Onset  . Heart disease Mother   . Kidney failure Mother   . Hypertension Mother   . Diabetes Mother   . Heart attack Father   . Hypertension Father   . Thyroid disease Father   . Hypertension Sister   . Diabetes Sister   . Thyroid disease Daughter   . Thyroid disease Daughter    Past Surgical History  Procedure Laterality Date  . Coronary angioplasty with stent placement     Mini Cog  Clock drawing appropriate with correct time. Patient recall of three objects correct.   Adv Dir: Patient does not have Advanced Directive and does not want information at this time.   Diet & Exercise: Eats out, fast food, and cooks at home. Eats wide variety of vegetables and pork, chicken at home. Patient does not currently exercise and has no plans  to start.   Relevant past medical, surgical, family and social history reviewed and updated as indicated. Interim medical history since our last visit reviewed. Allergies and medications reviewed and updated.  Review of Systems  Constitutional: Negative.   HENT: Negative.   Eyes: Negative.        Clear eye discharge. Patient thinks due to seasonal allergies but never been diagnosed.  Respiratory: Positive for shortness of breath.   Cardiovascular: Negative.   Gastrointestinal: Negative.   Endocrine: Negative.   Genitourinary: Negative.   Musculoskeletal: Positive for myalgias and arthralgias.  Skin: Negative.   Allergic/Immunologic: Positive for environmental allergies.  Neurological: Negative.   Hematological: Negative.   Psychiatric/Behavioral: Negative.     Per HPI unless specifically indicated above     Objective:    BP 144/78 mmHg  Pulse 72  Temp(Src) 98 F (36.7 C)  Ht 5' 1.6" (1.565 m)  Wt 211 lb 6.4 oz (95.89 kg)  BMI 39.15 kg/m2  SpO2 97%  Wt Readings from Last 3 Encounters:  05/10/15 211 lb 6.4 oz (95.89 kg)  03/15/15 210 lb 5.1 oz (95.4 kg)  03/08/15 211 lb 9.6 oz (95.981 kg)    Physical Exam  Constitutional: She is oriented to person, place, and time. She appears well-developed and well-nourished.  HENT:  Head: Normocephalic and atraumatic.  Eyes: Pupils are equal,  round, and reactive to light. Right eye exhibits no discharge. Left eye exhibits no discharge. No scleral icterus.  Neck: Normal range of motion. Neck supple. Carotid bruit is not present. No thyromegaly present.  Cardiovascular: Normal rate, regular rhythm and normal heart sounds.  Exam reveals no gallop and no friction rub.   No murmur heard. Pulmonary/Chest: Effort normal and breath sounds normal. No respiratory distress. She has no wheezes. She has no rales.  Abdominal: Soft. Bowel sounds are normal. There is no tenderness. There is no rebound.  Genitourinary: No breast swelling,  tenderness or discharge.  Musculoskeletal: Normal range of motion.  Lymphadenopathy:    She has no cervical adenopathy.  Neurological: She is alert and oriented to person, place, and time.  Skin: Skin is warm, dry and intact. No rash noted.  Psychiatric: She has a normal mood and affect. Her speech is normal and behavior is normal. Judgment and thought content normal. Cognition and memory are normal.    Results for orders placed or performed in visit on 03/15/15  CBC with Differential/Platelet  Result Value Ref Range   WBC 7.8 3.6 - 11.0 K/uL   RBC 4.22 3.80 - 5.20 MIL/uL   Hemoglobin 13.0 12.0 - 16.0 g/dL   HCT 38.8 35.0 - 47.0 %   MCV 91.9 80.0 - 100.0 fL   MCH 30.9 26.0 - 34.0 pg   MCHC 33.6 32.0 - 36.0 g/dL   RDW 17.7 (H) 11.5 - 14.5 %   Platelets 198 150 - 440 K/uL   Neutrophils Relative % 67 %   Neutro Abs 5.3 1.4 - 6.5 K/uL   Lymphocytes Relative 18 %   Lymphs Abs 1.4 1.0 - 3.6 K/uL   Monocytes Relative 10 %   Monocytes Absolute 0.8 0.2 - 0.9 K/uL   Eosinophils Relative 5 %   Eosinophils Absolute 0.4 0 - 0.7 K/uL   Basophils Relative 0 %   Basophils Absolute 0.0 0 - 0.1 K/uL  Ferritin  Result Value Ref Range   Ferritin 255 11 - 307 ng/mL      Assessment & Plan:   Problem List Items Addressed This Visit    None    Visit Diagnoses    Annual physical exam    -  Primary    Discussed the benefits of diet & exercise and supplementing Calcium and Vit D. Patient not interested in exercising. Patient not interested in [employer].    Encounter for screening mammogram for breast cancer        Order for [facility] for mammogram.     Relevant Orders    MM DIGITAL SCREENING BILATERAL        Follow up plan: Return in about 3 months (around 08/10/2015).

## 2015-05-17 ENCOUNTER — Ambulatory Visit
Admission: RE | Admit: 2015-05-17 | Discharge: 2015-05-17 | Disposition: A | Discharge status: Home / Self Care | Payer: PRIVATE HEALTH INSURANCE | Source: Ambulatory Visit | Attending: Gastroenterology | Admitting: Gastroenterology

## 2015-05-17 ENCOUNTER — Telehealth: Payer: Self-pay | Admitting: Gastroenterology

## 2015-05-17 NOTE — Telephone Encounter (Signed)
Spoke with Endo unit at [facility]. Machine is back up and running. They will call pt to reschedule capsule study.

## 2015-05-17 NOTE — Telephone Encounter (Signed)
Patient has called and stated that her capsule swallow test was canceled today due to the machine that takes the information was down, she needs to reschedule.

## 2015-05-17 NOTE — H&P (Signed)
Patient for a capsule endoscopy. No changes noted in her medical history or physical exam.

## 2015-05-18 ENCOUNTER — Telehealth: Payer: Self-pay | Admitting: Gastroenterology

## 2015-05-18 ENCOUNTER — Encounter
Admission: RE | Disposition: A | Discharge status: Home / Self Care | Payer: Self-pay | Source: Ambulatory Visit | Attending: Gastroenterology

## 2015-05-18 SURGERY — IMAGING PROCEDURE, GI TRACT, INTRALUMINAL, VIA CAPSULE

## 2015-05-18 NOTE — Telephone Encounter (Signed)
Needs to reschedule capsule study If she isn't at home please call her husbands phone [phone]

## 2015-05-19 NOTE — Telephone Encounter (Signed)
Left message for pt to return my call regarding rescheduling her capsule study.

## 2015-05-23 NOTE — Telephone Encounter (Signed)
Pt has been scheduled for capsule study at [facility] on 06/01/15.

## 2015-05-29 ENCOUNTER — Telehealth: Payer: Self-pay | Admitting: Gastroenterology

## 2015-05-29 NOTE — Telephone Encounter (Signed)
Capsule study is 4/13

## 2015-05-30 ENCOUNTER — Encounter: Payer: Self-pay | Admitting: *Deleted

## 2015-05-30 ENCOUNTER — Emergency Department: Payer: PRIVATE HEALTH INSURANCE

## 2015-05-30 ENCOUNTER — Other Ambulatory Visit: Payer: Self-pay

## 2015-05-30 ENCOUNTER — Emergency Department
Admission: EM | Admit: 2015-05-30 | Discharge: 2015-05-30 | Disposition: A | Discharge status: Home / Self Care | Payer: PRIVATE HEALTH INSURANCE | Attending: Emergency Medicine | Admitting: Emergency Medicine

## 2015-05-30 DIAGNOSIS — Z91040 Latex allergy status: Secondary | ICD-10-CM | POA: Diagnosis not present

## 2015-05-30 DIAGNOSIS — Z7902 Long term (current) use of antithrombotics/antiplatelets: Secondary | ICD-10-CM | POA: Diagnosis not present

## 2015-05-30 DIAGNOSIS — N183 Chronic kidney disease, stage 3 (moderate): Secondary | ICD-10-CM | POA: Insufficient documentation

## 2015-05-30 DIAGNOSIS — E7800 Pure hypercholesterolemia, unspecified: Secondary | ICD-10-CM | POA: Diagnosis not present

## 2015-05-30 DIAGNOSIS — I2510 Atherosclerotic heart disease of native coronary artery without angina pectoris: Secondary | ICD-10-CM | POA: Diagnosis not present

## 2015-05-30 DIAGNOSIS — E669 Obesity, unspecified: Secondary | ICD-10-CM | POA: Diagnosis not present

## 2015-05-30 DIAGNOSIS — Z79899 Other long term (current) drug therapy: Secondary | ICD-10-CM | POA: Diagnosis not present

## 2015-05-30 DIAGNOSIS — R05 Cough: Secondary | ICD-10-CM | POA: Diagnosis not present

## 2015-05-30 DIAGNOSIS — E876 Hypokalemia: Secondary | ICD-10-CM | POA: Diagnosis not present

## 2015-05-30 DIAGNOSIS — E0822 Diabetes mellitus due to underlying condition with diabetic chronic kidney disease: Secondary | ICD-10-CM | POA: Diagnosis not present

## 2015-05-30 DIAGNOSIS — E785 Hyperlipidemia, unspecified: Secondary | ICD-10-CM | POA: Diagnosis not present

## 2015-05-30 DIAGNOSIS — I5032 Chronic diastolic (congestive) heart failure: Secondary | ICD-10-CM | POA: Insufficient documentation

## 2015-05-30 DIAGNOSIS — Z7982 Long term (current) use of aspirin: Secondary | ICD-10-CM | POA: Diagnosis not present

## 2015-05-30 DIAGNOSIS — I130 Hypertensive heart and chronic kidney disease with heart failure and stage 1 through stage 4 chronic kidney disease, or unspecified chronic kidney disease: Secondary | ICD-10-CM | POA: Insufficient documentation

## 2015-05-30 DIAGNOSIS — M1990 Unspecified osteoarthritis, unspecified site: Secondary | ICD-10-CM | POA: Diagnosis not present

## 2015-05-30 DIAGNOSIS — R059 Cough, unspecified: Secondary | ICD-10-CM

## 2015-05-30 DIAGNOSIS — Z955 Presence of coronary angioplasty implant and graft: Secondary | ICD-10-CM | POA: Insufficient documentation

## 2015-05-30 DIAGNOSIS — F419 Anxiety disorder, unspecified: Secondary | ICD-10-CM | POA: Diagnosis not present

## 2015-05-30 DIAGNOSIS — Z8739 Personal history of other diseases of the musculoskeletal system and connective tissue: Secondary | ICD-10-CM | POA: Diagnosis not present

## 2015-05-30 DIAGNOSIS — R064 Hyperventilation: Secondary | ICD-10-CM | POA: Diagnosis not present

## 2015-05-30 MED ORDER — HYDROCOD POLST-CPM POLST ER 10-8 MG/5ML PO SUER: 5.0000 mL | Freq: Two times a day (BID) | ORAL | Status: DC

## 2015-05-30 MED ORDER — HYDROCOD POLST-CPM POLST ER 10-8 MG/5ML PO SUER
5.0000 mL | Freq: Once | ORAL | Status: AC
  Administered 2015-05-30: 5 mL via ORAL

## 2015-05-30 MED ORDER — HYDROCOD POLST-CPM POLST ER 10-8 MG/5ML PO SUER
ORAL | Status: DC
Start: 2015-05-30 — End: 2015-05-30
  Filled 2015-05-30: qty 5

## 2015-05-30 NOTE — ED Notes (Signed)
Pt brought in v ia ems from home with a dry cough.  Sx started 1 hour ago   Pt denies feeling sob.  No chest pain.  Pt took no otc meds for cough.  Pt reports she does not feel anxious.  Pt denies SI or HI.  Pt alert.

## 2015-05-30 NOTE — ED Provider Notes (Signed)
[facility] Emergency Department Provider Note  ____________________________________________  Time seen: Approximately 1:31 AM  I have reviewed the triage vital signs and the nursing notes.   HISTORY  Chief Complaint Cough    HPI Erica Larsen is a 65 y.o. female who presents to the ED from home via EMS with a chief complaint of cough. Patient reports cough started 1 hour ago; patient was still awake. Describes nonproductive, hacking cough. Denies associated symptoms of fever, chills, shortness of breath, chest pain, abdominal pain, nausea, vomiting, diarrhea. Reports eating spaghetti for dinner and feels that may have aggravated her reflux which has induced her cough.Denies recent travel or trauma. Nothing makes her symptoms better or worse.   Past Medical History  Diagnosis Date  . Hypertension   . Diabetes mellitus without complication ([place])   . High cholesterol   . Thyroid disease   . GERD (gastroesophageal reflux disease)   . Coronary artery disease   . Diverticulosis   . Osteopenia     Hips  . Esophagitis   . Lumbar radiculopathy   . Hypercalcemia   . Obesity   . Arthritis     osteoarthritis  . CHF (congestive heart failure) [facility])     Patient Active Problem List   Diagnosis Date Noted  . Hyperlipemia 03/08/2015  . Iron deficiency anemia due to chronic blood loss 12/12/2014  . CAD (coronary artery disease) 11/25/2014  . Absolute anemia 11/25/2014  . Chronic kidney disease, stage 3 11/25/2014  . Hypokalemia 11/25/2014  . Chronic diastolic heart failure ([place]) 08/17/2014  . 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 Essential hypertension 08/17/2014    Past Surgical History  Procedure Laterality Date  . Coronary angioplasty with stent placement      Current Outpatient Rx  Name  Route  Sig  Dispense  Refill  . amLODipine (NORVASC) 2.5 MG tablet   Oral   Take 1 tablet (2.5 mg total) by mouth daily.   90  tablet   0   . aspirin 81 MG tablet   Oral   Take 81 mg by mouth every morning.          [name] atorvastatin (LIPITOR) 40 MG tablet   Oral   Take 40 mg by mouth daily.         . clopidogrel (PLAVIX) 75 MG tablet   Oral   Take 75 mg by mouth every morning.          . furosemide (LASIX) 40 MG tablet   Oral   Take 1 tablet (40 mg total) by mouth daily.   30 tablet   5   . lansoprazole (PREVACID) 30 MG capsule   Oral   Take 1 capsule (30 mg total) by mouth daily at 12 noon.   90 capsule   1   . levothyroxine (SYNTHROID, LEVOTHROID) 100 MCG tablet   Oral   Take 100 mcg by mouth daily before breakfast.         . metoprolol (LOPRESSOR) 50 MG tablet   Oral   Take 50 mg by mouth daily.         [name] olmesartan (BENICAR) 40 MG tablet   Oral   Take 1 tablet (40 mg total) by mouth daily.   90 tablet   1   . Potassium Chloride ER 20 MEQ TBCR   Oral   Take 20 mEq by mouth daily.   30 tablet   5   . raloxifene (  EVISTA) 60 MG tablet   Oral   Take 1 tablet (60 mg total) by mouth daily.   90 tablet   1     Allergies Latex and Sulfa antibiotics  Family History  Problem Relation Age of Onset  . Heart disease Mother   . Kidney failure Mother   . Hypertension Mother   . Diabetes Mother   . Heart attack Father   . Hypertension Father   . Thyroid disease Father   . Hypertension Sister   . Diabetes Sister   . Thyroid disease Daughter   . Thyroid disease Daughter     Social History Social History  Substance Use Topics  . Smoking status: Never Smoker   . Smokeless tobacco: Never Used  . Alcohol Use: No    Review of Systems  Constitutional: No fever/chills. Eyes: No visual changes. ENT: No sore throat. Cardiovascular: Denies chest pain. Respiratory: Positive for nonproductive cough. Denies shortness of breath. Gastrointestinal: No abdominal pain.  No nausea, no vomiting.  No diarrhea.  No constipation. Genitourinary: Negative for  dysuria. Musculoskeletal: Negative for back pain. Skin: Negative for rash. Neurological: Negative for headaches, focal weakness or numbness.  10-point ROS otherwise negative.  ____________________________________________   PHYSICAL EXAM:  VITAL SIGNS: ED Triage Vitals  Enc Vitals Group     BP 05/30/15 0128 132/60 mmHg     Pulse Rate 05/30/15 0128 86     Resp 05/30/15 0128 22     Temp 05/30/15 0128 98.6 F (37 C)     Temp Source 05/30/15 0128 Oral     SpO2 05/30/15 0128 99 %     Weight 05/30/15 0128 220 lb (99.791 kg)     Height 05/30/15 0128 5\' 2"  (1.575 m)     Head Cir --      Peak Flow --      Pain Score --      Pain Loc --      Pain Edu? --      Excl. in [place]? --     Constitutional: Alert and oriented. Well appearing and in no acute distress. Eyes: Conjunctivae are normal. PERRL. EOMI. Head: Atraumatic. Nose: No congestion/rhinnorhea. Mouth/Throat: Mucous membranes are moist.  Oropharynx non-erythematous. Neck: No stridor.   Cardiovascular: Normal rate, regular rhythm. Grossly normal heart sounds.  Good peripheral circulation. Respiratory: Normal respiratory effort.  No retractions. Lungs CTAB. No rales. Nonproductive cough noted. Gastrointestinal: Soft and nontender. No distention. No abdominal bruits. No CVA tenderness. Musculoskeletal: No lower extremity tenderness nor edema.  No joint effusions. Neurologic:  Normal speech and language. No gross focal neurologic deficits are appreciated. No gait instability. Skin:  Skin is warm, dry and intact. No rash noted. Psychiatric: Mood and affect are normal. Speech and behavior are normal.  ____________________________________________   LABS (all labs ordered are listed, but only abnormal results are displayed)  Labs Reviewed - No data to display ____________________________________________  EKG  ED ECG REPORT I, SUNG,JADE J, the attending physician, personally viewed and interpreted this ECG.   Date:  05/30/2015  EKG Time: 0144  Rate: 85  Rhythm: normal EKG, normal sinus rhythm  Axis: LAD  Intervals:left bundle branch block  ST&T Change: Nonspecific Unchanged from June/2016 ____________________________________________  RADIOLOGY  Chest 2 view (viewed by me, interpreted per Dr. Radene Knee): 1. No acute cardiopulmonary process seen. 2. Moderate to large hiatal hernia noted. ____________________________________________   PROCEDURES  Procedure(s) performed: None  Critical Care performed: No  ____________________________________________   INITIAL IMPRESSION / ASSESSMENT AND  PLAN / ED COURSE  Pertinent labs & imaging results that were available during my care of the patient were reviewed by me and considered in my medical decision making (see chart for details).  64 year old female who presents for nonproductive cough. Denies chest pain, patient is afebrile, normotensive with room air saturations 99%. Will obtain EKG, chest x-ray and administer Tussionex for cough.  ----------------------------------------- 3:09 AM on 05/30/2015 -----------------------------------------  Patient has stopped coughing and is feeling much much better. Strict return precautions given. Patient family verbalize understanding and agree with plan. ____________________________________________   FINAL CLINICAL IMPRESSION(S) / ED DIAGNOSES  Final diagnoses:  Cough      Paulette Blanch, MD 05/30/15 [phone]

## 2015-05-30 NOTE — ED Notes (Signed)
Pt brought in via ems from home with a cough.  Pt states it started 1 hour ago.  Nonproductive.  Nonsmoker.  Denies sob or chest pain.  Pt denies feeling anxious.  Pt alert.

## 2015-05-30 NOTE — Discharge Instructions (Signed)
1. You may take cough medicine as needed (Tussionex). 2. Return to the ER for worsening symptoms, persistent vomiting, difficulty breathing or other concerns.  Cough, Adult Coughing is a reflex that clears your throat and your airways. Coughing helps to heal and protect your lungs. It is normal to cough occasionally, but a cough that happens with other symptoms or lasts a long time may be a sign of a condition that needs treatment. A cough may last only 2-3 weeks (acute), or it may last longer than 8 weeks (chronic). CAUSES Coughing is commonly caused by:  Breathing in substances that irritate your lungs.  A viral or bacterial respiratory infection.  Allergies.  Asthma.  Postnasal drip.  Smoking.  Acid backing up from the stomach into the esophagus (gastroesophageal reflux).  Certain medicines.  Chronic lung problems, including COPD (or rarely, lung cancer).  Other medical conditions such as heart failure. HOME CARE INSTRUCTIONS  Pay attention to any changes in your symptoms. Take these actions to help with your discomfort:  Take medicines only as told by your health care provider.  If you were prescribed an antibiotic medicine, take it as told by your health care provider. Do not stop taking the antibiotic even if you start to feel better.  Talk with your health care provider before you take a cough suppressant medicine.  Drink enough fluid to keep your urine clear or pale yellow.  If the air is dry, use a cold steam vaporizer or humidifier in your bedroom or your home to help loosen secretions.  Avoid anything that causes you to cough at work or at home.  If your cough is worse at night, try sleeping in a semi-upright position.  Avoid cigarette smoke. If you smoke, quit smoking. If you need help quitting, ask your health care provider.  Avoid caffeine.  Avoid alcohol.  Rest as needed. SEEK MEDICAL CARE IF:   You have new symptoms.  You cough up pus.  Your  cough does not get better after 2-3 weeks, or your cough gets worse.  You cannot control your cough with suppressant medicines and you are losing sleep.  You develop pain that is getting worse or pain that is not controlled with pain medicines.  You have a fever.  You have unexplained weight loss.  You have night sweats. SEEK IMMEDIATE MEDICAL CARE IF:  You cough up blood.  You have difficulty breathing.  Your heartbeat is very fast.   This information is not intended to replace advice given to you by your health care provider. Make sure you discuss any questions you have with your health care provider.   Document Released: 08/03/2010 Document Revised: 10/26/2014 Document Reviewed: 04/13/2014 Elsevier Interactive Patient Education [employer].

## 2015-05-30 NOTE — ED Notes (Signed)
Patient transported to X-ray 

## 2015-05-30 NOTE — ED Notes (Signed)
Pt ambulatory to toilet without distress or difficulty.

## 2015-05-31 NOTE — Telephone Encounter (Signed)
FYI... Pt is scheduled for a capsule study on 06/01/15. Keep in mind you will need to read this at some point when you are at [facility] after this date.

## 2015-06-01 ENCOUNTER — Ambulatory Visit
Admission: RE | Admit: 2015-06-01 | Discharge: 2015-06-01 | Disposition: A | Discharge status: Home / Self Care | Payer: PRIVATE HEALTH INSURANCE | Source: Ambulatory Visit | Attending: Gastroenterology | Admitting: Gastroenterology

## 2015-06-01 ENCOUNTER — Encounter
Admission: RE | Disposition: A | Discharge status: Home / Self Care | Payer: Self-pay | Source: Ambulatory Visit | Attending: Gastroenterology

## 2015-06-01 DIAGNOSIS — Z833 Family history of diabetes mellitus: Secondary | ICD-10-CM | POA: Diagnosis not present

## 2015-06-01 DIAGNOSIS — K31819 Angiodysplasia of stomach and duodenum without bleeding: Secondary | ICD-10-CM | POA: Insufficient documentation

## 2015-06-01 DIAGNOSIS — I10 Essential (primary) hypertension: Secondary | ICD-10-CM | POA: Insufficient documentation

## 2015-06-01 DIAGNOSIS — K599 Functional intestinal disorder, unspecified: Secondary | ICD-10-CM | POA: Insufficient documentation

## 2015-06-01 DIAGNOSIS — K210 Gastro-esophageal reflux disease with esophagitis: Secondary | ICD-10-CM | POA: Diagnosis not present

## 2015-06-01 DIAGNOSIS — E7800 Pure hypercholesterolemia, unspecified: Secondary | ICD-10-CM | POA: Insufficient documentation

## 2015-06-01 DIAGNOSIS — E119 Type 2 diabetes mellitus without complications: Secondary | ICD-10-CM | POA: Insufficient documentation

## 2015-06-01 DIAGNOSIS — E079 Disorder of thyroid, unspecified: Secondary | ICD-10-CM | POA: Insufficient documentation

## 2015-06-01 DIAGNOSIS — I509 Heart failure, unspecified: Secondary | ICD-10-CM | POA: Diagnosis not present

## 2015-06-01 DIAGNOSIS — M8580 Other specified disorders of bone density and structure, unspecified site: Secondary | ICD-10-CM | POA: Insufficient documentation

## 2015-06-01 DIAGNOSIS — I2510 Atherosclerotic heart disease of native coronary artery without angina pectoris: Secondary | ICD-10-CM | POA: Diagnosis not present

## 2015-06-01 DIAGNOSIS — K5790 Diverticulosis of intestine, part unspecified, without perforation or abscess without bleeding: Secondary | ICD-10-CM | POA: Diagnosis not present

## 2015-06-01 DIAGNOSIS — Z882 Allergy status to sulfonamides status: Secondary | ICD-10-CM | POA: Insufficient documentation

## 2015-06-01 DIAGNOSIS — D509 Iron deficiency anemia, unspecified: Secondary | ICD-10-CM | POA: Diagnosis not present

## 2015-06-01 DIAGNOSIS — M5416 Radiculopathy, lumbar region: Secondary | ICD-10-CM | POA: Insufficient documentation

## 2015-06-01 DIAGNOSIS — Z7982 Long term (current) use of aspirin: Secondary | ICD-10-CM | POA: Insufficient documentation

## 2015-06-01 DIAGNOSIS — Z955 Presence of coronary angioplasty implant and graft: Secondary | ICD-10-CM | POA: Diagnosis not present

## 2015-06-01 DIAGNOSIS — Z7902 Long term (current) use of antithrombotics/antiplatelets: Secondary | ICD-10-CM | POA: Diagnosis not present

## 2015-06-01 DIAGNOSIS — E669 Obesity, unspecified: Secondary | ICD-10-CM | POA: Diagnosis not present

## 2015-06-01 DIAGNOSIS — Z8349 Family history of other endocrine, nutritional and metabolic diseases: Secondary | ICD-10-CM | POA: Insufficient documentation

## 2015-06-01 DIAGNOSIS — Z8249 Family history of ischemic heart disease and other diseases of the circulatory system: Secondary | ICD-10-CM | POA: Diagnosis not present

## 2015-06-01 DIAGNOSIS — Z91040 Latex allergy status: Secondary | ICD-10-CM | POA: Diagnosis not present

## 2015-06-01 DIAGNOSIS — M1990 Unspecified osteoarthritis, unspecified site: Secondary | ICD-10-CM | POA: Diagnosis not present

## 2015-06-01 DIAGNOSIS — Z79899 Other long term (current) drug therapy: Secondary | ICD-10-CM | POA: Insufficient documentation

## 2015-06-01 HISTORY — PX: GIVENS CAPSULE STUDY: SHX5432

## 2015-06-01 SURGERY — IMAGING PROCEDURE, GI TRACT, INTRALUMINAL, VIA CAPSULE

## 2015-06-01 NOTE — H&P (Signed)
[facility]  [address], [place], [place] [address] Phone: [phone] Fax : [phone]  Primary Care Physician:  Kathrine Haddock, NP Primary Gastroenterologist:  Dr. Allen Norris  Pre-Procedure History & Physical: HPI:  Erica Larsen is a 65 y.o. female is here for an capsule endoscopy.   Past Medical History  Diagnosis Date  . Hypertension   . Diabetes mellitus without complication ([place])   . High cholesterol   . Thyroid disease   . GERD (gastroesophageal reflux disease)   . Coronary artery disease   . Diverticulosis   . Osteopenia     Hips  . Esophagitis   . Lumbar radiculopathy   . Hypercalcemia   . Obesity   . Arthritis     osteoarthritis  . CHF (congestive heart failure) [facility])     Past Surgical History  Procedure Laterality Date  . Coronary angioplasty with stent placement      Prior to Admission medications   Medication Sig Start Date End Date Taking? Authorizing Provider  amLODipine (NORVASC) 2.5 MG tablet Take 1 tablet (2.5 mg total) by mouth daily. 03/08/15   Kathrine Haddock, NP  aspirin 81 MG tablet Take 81 mg by mouth every morning.     Historical Provider, MD  atorvastatin (LIPITOR) 40 MG tablet Take 40 mg by mouth daily.    Historical Provider, MD  chlorpheniramine-HYDROcodone (TUSSIONEX PENNKINETIC ER) 10-8 MG/5ML SUER Take 5 mLs by mouth 2 (two) times daily. 05/30/15   Paulette Blanch, MD  clopidogrel (PLAVIX) 75 MG tablet Take 75 mg by mouth every morning.     Historical Provider, MD  furosemide (LASIX) 40 MG tablet Take 1 tablet (40 mg total) by mouth daily. 12/14/14   Alisa Graff, FNP  lansoprazole (PREVACID) 30 MG capsule Take 1 capsule (30 mg total) by mouth daily at 12 noon. 03/08/15   Kathrine Haddock, NP  levothyroxine (SYNTHROID, LEVOTHROID) 100 MCG tablet Take 100 mcg by mouth daily before breakfast.    Historical Provider, MD  metoprolol (LOPRESSOR) 50 MG tablet Take 50 mg by mouth daily.    Historical Provider, MD  olmesartan  (BENICAR) 40 MG tablet Take 1 tablet (40 mg total) by mouth daily. 11/25/14   Kathrine Haddock, NP  Potassium Chloride ER 20 MEQ TBCR Take 20 mEq by mouth daily. 12/14/14   Alisa Graff, FNP  raloxifene (EVISTA) 60 MG tablet Take 1 tablet (60 mg total) by mouth daily. 03/08/15   Kathrine Haddock, NP    Allergies as of 05/22/2015 - Review Complete 05/10/2015  Allergen Reaction Noted  . Latex  08/07/2014  . Sulfa antibiotics  08/07/2014    Family History  Problem Relation Age of Onset  . Heart disease Mother   . Kidney failure Mother   . Hypertension Mother   . Diabetes Mother   . Heart attack Father   . Hypertension Father   . Thyroid disease Father   . Hypertension Sister   . Diabetes Sister   . Thyroid disease Daughter   . Thyroid disease Daughter     Social History   Social History  . Marital Status: Married    Spouse Name: N/A  . Number of Children: N/A  . Years of Education: N/A   Occupational History  . Not on file.   Social History Main Topics  . Smoking status: Never Smoker   . Smokeless tobacco: Never Used  . Alcohol Use: No  . Drug Use: No  . Sexual Activity: Yes  Birth Control/ Protection: Post-menopausal   Other Topics Concern  . Not on file   Social History Narrative    Review of Systems: See HPI, otherwise negative ROS  Physical Exam: There were no vitals taken for this visit. General:   Alert,  pleasant and cooperative in NAD Head:  Normocephalic and atraumatic. Neck:  Supple; no masses or thyromegaly. Lungs:  Clear throughout to auscultation.    Heart:  Regular rate and rhythm. Abdomen:  Soft, nontender and nondistended. Normal bowel sounds, without guarding, and without rebound.   Neurologic:  Alert and  oriented x4;  grossly normal neurologically.  Impression/Plan: Erica Larsen is here for an capsule endoscopy to be performed for anemia  Risks, benefits, limitations, and alternatives regarding  capsule endoscopy have been reviewed with  the patient.  Questions have been answered.  All parties agreeable.   Ollen Bowl, MD  06/01/2015, 11:08 AM

## 2015-06-05 ENCOUNTER — Encounter: Payer: Self-pay | Admitting: Gastroenterology

## 2015-06-13 ENCOUNTER — Other Ambulatory Visit: Payer: Self-pay | Admitting: Unknown Physician Specialty

## 2015-06-20 ENCOUNTER — Other Ambulatory Visit: Payer: Self-pay | Admitting: Unknown Physician Specialty

## 2015-06-20 ENCOUNTER — Other Ambulatory Visit: Payer: Self-pay | Admitting: Family

## 2015-06-22 ENCOUNTER — Telehealth: Payer: Self-pay | Admitting: Unknown Physician Specialty

## 2015-06-22 MED ORDER — FUROSEMIDE 40 MG PO TABS: 40.0000 mg | ORAL_TABLET | Freq: Every day | ORAL | Status: DC

## 2015-06-22 NOTE — Telephone Encounter (Signed)
Pharm is [facility] [address]. Thanks.

## 2015-06-22 NOTE — Telephone Encounter (Signed)
Pt called stated she needs a refill on Lasix. Stated [facility] does not normally prescribe this medication, it was given to the pt by the hospital, and then refilled by the heart clinic. Heart clinic will not refill this medication until the pt is seen. Pt stated she can not go in for an appt right now. Thanks.

## 2015-06-22 NOTE — Telephone Encounter (Signed)
I filled it.

## 2015-07-03 ENCOUNTER — Other Ambulatory Visit: Payer: Self-pay

## 2015-07-03 ENCOUNTER — Telehealth: Payer: Self-pay | Admitting: Gastroenterology

## 2015-07-03 MED ORDER — PEG 3350-KCL-NABCB-NACL-NASULF 236 G PO SOLR: 4000.0000 mL | Freq: Once | ORAL | Status: DC

## 2015-07-03 NOTE — Telephone Encounter (Signed)
Wants results from test. Please call before 1:00 today

## 2015-07-03 NOTE — Telephone Encounter (Signed)
Results given from Capsule study. Per Dr. Dorothey Baseman recommendation, he would like pt to be scheduled for colonoscopy. Pt has been set up for procedure at [facility] 08/01/15. Instructs/rx mailed.

## 2015-07-12 ENCOUNTER — Telehealth: Payer: Self-pay | Admitting: Internal Medicine

## 2015-07-12 ENCOUNTER — Other Ambulatory Visit: Payer: Self-pay | Admitting: Internal Medicine

## 2015-07-12 ENCOUNTER — Inpatient Hospital Stay: Payer: PRIVATE HEALTH INSURANCE | Attending: Internal Medicine

## 2015-07-12 DIAGNOSIS — I509 Heart failure, unspecified: Secondary | ICD-10-CM | POA: Diagnosis not present

## 2015-07-12 DIAGNOSIS — M5416 Radiculopathy, lumbar region: Secondary | ICD-10-CM | POA: Insufficient documentation

## 2015-07-12 DIAGNOSIS — K209 Esophagitis, unspecified: Secondary | ICD-10-CM | POA: Insufficient documentation

## 2015-07-12 DIAGNOSIS — K5520 Angiodysplasia of colon without hemorrhage: Secondary | ICD-10-CM | POA: Insufficient documentation

## 2015-07-12 DIAGNOSIS — M8580 Other specified disorders of bone density and structure, unspecified site: Secondary | ICD-10-CM | POA: Insufficient documentation

## 2015-07-12 DIAGNOSIS — Z79899 Other long term (current) drug therapy: Secondary | ICD-10-CM | POA: Diagnosis not present

## 2015-07-12 DIAGNOSIS — Z7901 Long term (current) use of anticoagulants: Secondary | ICD-10-CM | POA: Diagnosis not present

## 2015-07-12 DIAGNOSIS — Z7982 Long term (current) use of aspirin: Secondary | ICD-10-CM | POA: Insufficient documentation

## 2015-07-12 DIAGNOSIS — E119 Type 2 diabetes mellitus without complications: Secondary | ICD-10-CM | POA: Insufficient documentation

## 2015-07-12 DIAGNOSIS — E079 Disorder of thyroid, unspecified: Secondary | ICD-10-CM | POA: Insufficient documentation

## 2015-07-12 DIAGNOSIS — I2510 Atherosclerotic heart disease of native coronary artery without angina pectoris: Secondary | ICD-10-CM | POA: Insufficient documentation

## 2015-07-12 DIAGNOSIS — I10 Essential (primary) hypertension: Secondary | ICD-10-CM | POA: Insufficient documentation

## 2015-07-12 DIAGNOSIS — K219 Gastro-esophageal reflux disease without esophagitis: Secondary | ICD-10-CM | POA: Diagnosis not present

## 2015-07-12 DIAGNOSIS — K5790 Diverticulosis of intestine, part unspecified, without perforation or abscess without bleeding: Secondary | ICD-10-CM | POA: Diagnosis not present

## 2015-07-12 DIAGNOSIS — E7800 Pure hypercholesterolemia, unspecified: Secondary | ICD-10-CM | POA: Diagnosis not present

## 2015-07-12 DIAGNOSIS — D509 Iron deficiency anemia, unspecified: Secondary | ICD-10-CM | POA: Insufficient documentation

## 2015-07-12 LAB — FERRITIN: FERRITIN: 209 ng/mL (ref 11–307)

## 2015-07-12 LAB — CBC WITH DIFFERENTIAL/PLATELET
Basophils Absolute: 0 10*3/uL (ref 0–0.1)
Basophils Relative: 0 %
EOS ABS: 0.2 10*3/uL (ref 0–0.7)
Eosinophils Relative: 4 %
HEMATOCRIT: 39.6 % (ref 35.0–47.0)
HEMOGLOBIN: 13.5 g/dL (ref 12.0–16.0)
LYMPHS ABS: 1.7 10*3/uL (ref 1.0–3.6)
Lymphocytes Relative: 27 %
MCH: 32.1 pg (ref 26.0–34.0)
MCHC: 34.2 g/dL (ref 32.0–36.0)
MCV: 93.9 fL (ref 80.0–100.0)
MONOS PCT: 11 %
Monocytes Absolute: 0.7 10*3/uL (ref 0.2–0.9)
NEUTROS ABS: 3.8 10*3/uL (ref 1.4–6.5)
NEUTROS PCT: 58 %
Platelets: 178 10*3/uL (ref 150–440)
RBC: 4.22 MIL/uL (ref 3.80–5.20)
RDW: 14.1 % (ref 11.5–14.5)
WBC: 6.5 10*3/uL (ref 3.6–11.0)

## 2015-07-12 NOTE — Telephone Encounter (Signed)
Gregary Signs called to let you know that Venofer does not require Pre-Auth. Thanks.

## 2015-07-13 ENCOUNTER — Inpatient Hospital Stay (HOSPITAL_BASED_OUTPATIENT_CLINIC_OR_DEPARTMENT_OTHER): Payer: PRIVATE HEALTH INSURANCE | Admitting: Internal Medicine

## 2015-07-13 ENCOUNTER — Inpatient Hospital Stay: Payer: PRIVATE HEALTH INSURANCE

## 2015-07-13 VITALS — BP 165/96 | HR 71 | Temp 97.0°F | Resp 20 | Ht 62.0 in | Wt 212.2 lb

## 2015-07-13 DIAGNOSIS — I2510 Atherosclerotic heart disease of native coronary artery without angina pectoris: Secondary | ICD-10-CM

## 2015-07-13 DIAGNOSIS — Z79899 Other long term (current) drug therapy: Secondary | ICD-10-CM

## 2015-07-13 DIAGNOSIS — Z7901 Long term (current) use of anticoagulants: Secondary | ICD-10-CM

## 2015-07-13 DIAGNOSIS — E7800 Pure hypercholesterolemia, unspecified: Secondary | ICD-10-CM

## 2015-07-13 DIAGNOSIS — I10 Essential (primary) hypertension: Secondary | ICD-10-CM

## 2015-07-13 DIAGNOSIS — K5520 Angiodysplasia of colon without hemorrhage: Secondary | ICD-10-CM | POA: Diagnosis not present

## 2015-07-13 DIAGNOSIS — D509 Iron deficiency anemia, unspecified: Secondary | ICD-10-CM | POA: Diagnosis not present

## 2015-07-13 DIAGNOSIS — D500 Iron deficiency anemia secondary to blood loss (chronic): Secondary | ICD-10-CM

## 2015-07-13 DIAGNOSIS — I509 Heart failure, unspecified: Secondary | ICD-10-CM

## 2015-07-13 DIAGNOSIS — E079 Disorder of thyroid, unspecified: Secondary | ICD-10-CM

## 2015-07-13 DIAGNOSIS — K219 Gastro-esophageal reflux disease without esophagitis: Secondary | ICD-10-CM

## 2015-07-13 DIAGNOSIS — M8580 Other specified disorders of bone density and structure, unspecified site: Secondary | ICD-10-CM

## 2015-07-13 DIAGNOSIS — K5790 Diverticulosis of intestine, part unspecified, without perforation or abscess without bleeding: Secondary | ICD-10-CM

## 2015-07-13 DIAGNOSIS — E119 Type 2 diabetes mellitus without complications: Secondary | ICD-10-CM | POA: Diagnosis not present

## 2015-07-13 DIAGNOSIS — M5416 Radiculopathy, lumbar region: Secondary | ICD-10-CM

## 2015-07-13 DIAGNOSIS — K209 Esophagitis, unspecified: Secondary | ICD-10-CM

## 2015-07-13 DIAGNOSIS — Z7982 Long term (current) use of aspirin: Secondary | ICD-10-CM

## 2015-07-13 NOTE — Progress Notes (Signed)
[place] NOTE  Patient Care Team: Kathrine Haddock, NP as PCP - General (Nurse Practitioner) Alisa Graff, FNP as Nurse Practitioner (Family Medicine) Dionisio David, MD as Consulting Physician (Cardiology) Cammie Sickle, MD as Consulting Physician (Internal Medicine)  CHIEF COMPLAINTS/PURPOSE OF CONSULTATION:   # 2014- Iron def Anemia-  [Sep 2014 EGD/COLO- NEG/ferritin 6; Dr.Wohl]. 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   # Hx CHF/CAD- asprin-plavix  HISTORY OF PRESENTING ILLNESS:  Erica Larsen 65 y.o.  female patient with above history of iron deficiency anemia of unclear etiology is here status post IV iron in November 2016 is here for follow-up.   Patient interim underwent capsule study that showed angiodysplasias is not bleeding.   Patient's energy levels are significantly improved. She continues to deny any blood in stools or black stools. Continues to have chronic swelling which she attributes to her history of congestive heart failure. However the swelling is currently improved. She has not had any significant weight gain the last few months.  ROS: A complete 10 point review of system is done which is negative except mentioned above in history of present illness  MEDICAL HISTORY:  Past Medical History  Diagnosis Date  . Hypertension   . Diabetes mellitus without complication ([place])   . High cholesterol   . Thyroid disease   . GERD (gastroesophageal reflux disease)   . Coronary artery disease   . Diverticulosis   . Osteopenia     Hips  . Esophagitis   . Lumbar radiculopathy   . Hypercalcemia   . Obesity   . Arthritis     osteoarthritis  . CHF (congestive heart failure) ([place])     SURGICAL HISTORY: Past Surgical History  Procedure Laterality Date  . Coronary angioplasty with stent placement    . Givens capsule  study N/A 06/01/2015    Procedure: GIVENS CAPSULE STUDY;  Surgeon: Lucilla Lame, MD;  Location: [facility] ENDOSCOPY;  Service: Endoscopy;  Laterality: N/A;    SOCIAL HISTORY: Social History   Social History  . Marital Status: Married    Spouse Name: N/A  . Number of Children: N/A  . Years of Education: N/A   Occupational History  . Not on file.   Social History Main Topics  . Smoking status: Never Smoker   . Smokeless tobacco: Never Used  . Alcohol Use: No  . Drug Use: No  . Sexual Activity: Yes    Birth Control/ Protection: Post-menopausal   Other Topics Concern  . Not on file   Social History Narrative    FAMILY HISTORY: Family History  Problem Relation Age of Onset  . Heart disease Mother   . Kidney failure Mother   . Hypertension Mother   . Diabetes Mother   . Heart attack Father   . Hypertension Father   . Thyroid disease Father   . Hypertension Sister   . Diabetes Sister   . Thyroid disease Daughter   . Thyroid disease Daughter     ALLERGIES:  is allergic to latex and sulfa antibiotics.  MEDICATIONS:  Current Outpatient Prescriptions  Medication Sig Dispense Refill  . amLODipine (NORVASC) 2.5 MG tablet Take 1 tablet (2.5 mg total) by mouth daily. 90 tablet 1  . aspirin 81 MG tablet Take 81 mg by mouth every morning.     . chlorpheniramine-HYDROcodone (TUSSIONEX PENNKINETIC ER) 10-8 MG/5ML SUER Take 5 mLs by mouth 2 (two)  times daily. 70 mL 0  . clopidogrel (PLAVIX) 75 MG tablet Take 75 mg by mouth every morning.     . furosemide (LASIX) 40 MG tablet Take 1 tablet (40 mg total) by mouth daily. 30 tablet 5  . lansoprazole (PREVACID) 30 MG capsule Take 1 capsule (30 mg total) by mouth daily at 12 noon. 90 capsule 1  . levothyroxine (SYNTHROID, LEVOTHROID) 100 MCG tablet Take 1 tablet (100 mcg total) by mouth daily. 90 tablet 3  . metoprolol (LOPRESSOR) 50 MG tablet Take 50 mg by mouth daily.    [name] olmesartan (BENICAR) 40 MG tablet Take 1 tablet (40 mg total) by  mouth daily. 90 tablet 1  . Potassium Chloride ER 20 MEQ TBCR Take 20 mEq by mouth daily. 30 tablet 5  . raloxifene (EVISTA) 60 MG tablet Take 1 tablet (60 mg total) by mouth daily. 90 tablet 1  . atorvastatin (LIPITOR) 40 MG tablet Take 40 mg by mouth daily.     No current facility-administered medications for this visit.      [name]  PHYSICAL EXAMINATION: ECOG PERFORMANCE STATUS: 1 - Symptomatic but completely ambulatory  Filed Vitals:   07/13/15 1002  BP: 165/96  Pulse: 71  Temp: 97 F (36.1 C)   Filed Weights   07/13/15 1002  Weight: 212 lb 3.1 oz (96.25 kg)    GENERAL: Well-nourished well-developed; Alert, no distress and comfortable. Obese. Puffy face. EYES: no pallor/ no icterus.  OROPHARYNX: no thrush or ulceration; poor dentition. NECK: supple, no masses felt LYMPH: no palpable lymphadenopathy in the cervical, axillary or inguinal regions LUNGS: clear to auscultation and No wheeze or crackles HEART/CVS: regular rate & rhythm and no murmurs; 1+ bilateral lower extremity edema. ABDOMEN: abdomen soft, non-tender and normal bowel sounds Musculoskeletal:no cyanosis of digits and no clubbing  PSYCH: alert & oriented x 3 with fluent speech NEURO: no focal motor/sensory deficits SKIN: no rashes or significant lesions   LABORATORY DATA:  I have reviewed the data as listed Lab Results  Component Value Date   WBC 6.5 07/12/2015   HGB 13.5 07/12/2015   HCT 39.6 07/12/2015   MCV 93.9 07/12/2015   PLT 178 07/12/2015    Recent Labs  08/10/14 0348 11/25/14 1108 03/08/15 1103  NA 128* 136 141  K 3.2* 4.7 4.9  CL 90* 95* 98  CO2 31 25 26   GLUCOSE 106* 118* 121*  BUN 13 15 16   CREATININE 0.80 1.09* 0.95  CALCIUM 8.2* 9.5 9.5  GFRNONAA >60 53* 63  GFRAA >60 61 72  PROT  --  6.3 7.3  ALBUMIN  --  4.4 4.6  AST  --  26 29  ALT  --  20 27  ALKPHOS  --  78 133*  BILITOT  --  0.6 0.4     ASSESSMENT & PLAN:  # Iron deficiency anemia- unclear etiology; question  GI blood loss. 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 Terminal ileal abnormality- awaiting repeat colonoscopy.  # Today hemoglobin is 13 ferritin is 200; I would not recommend any IV iron today. Recommend follow-up in 6 months; he'll have labs done few days prior; IV Feraheme if needed.  # 15 minutes face-to-face with the patient discussing the above plan of care; more than 50% of time spent ocounseling and coordination.    Cammie Sickle, MD 07/13/2015 10:41 AM

## 2015-07-13 NOTE — Progress Notes (Signed)
Patient here for follow up. No complaints today. Advised patient to see PCP regarding elevated BP.

## 2015-08-01 ENCOUNTER — Ambulatory Visit: Payer: PRIVATE HEALTH INSURANCE | Admitting: Certified Registered"

## 2015-08-01 ENCOUNTER — Encounter
Admission: RE | Disposition: A | Discharge status: Home / Self Care | Payer: Self-pay | Source: Ambulatory Visit | Attending: Gastroenterology

## 2015-08-01 ENCOUNTER — Ambulatory Visit
Admission: RE | Admit: 2015-08-01 | Discharge: 2015-08-01 | Disposition: A | Discharge status: Home / Self Care | Payer: PRIVATE HEALTH INSURANCE | Source: Ambulatory Visit | Attending: Gastroenterology | Admitting: Gastroenterology

## 2015-08-01 DIAGNOSIS — E669 Obesity, unspecified: Secondary | ICD-10-CM | POA: Insufficient documentation

## 2015-08-01 DIAGNOSIS — Z7982 Long term (current) use of aspirin: Secondary | ICD-10-CM | POA: Diagnosis not present

## 2015-08-01 DIAGNOSIS — Z841 Family history of disorders of kidney and ureter: Secondary | ICD-10-CM | POA: Insufficient documentation

## 2015-08-01 DIAGNOSIS — M8580 Other specified disorders of bone density and structure, unspecified site: Secondary | ICD-10-CM | POA: Insufficient documentation

## 2015-08-01 DIAGNOSIS — K5730 Diverticulosis of large intestine without perforation or abscess without bleeding: Secondary | ICD-10-CM | POA: Insufficient documentation

## 2015-08-01 DIAGNOSIS — Z955 Presence of coronary angioplasty implant and graft: Secondary | ICD-10-CM | POA: Insufficient documentation

## 2015-08-01 DIAGNOSIS — E7800 Pure hypercholesterolemia, unspecified: Secondary | ICD-10-CM | POA: Insufficient documentation

## 2015-08-01 DIAGNOSIS — R933 Abnormal findings on diagnostic imaging of other parts of digestive tract: Secondary | ICD-10-CM | POA: Diagnosis not present

## 2015-08-01 DIAGNOSIS — Z8349 Family history of other endocrine, nutritional and metabolic diseases: Secondary | ICD-10-CM | POA: Diagnosis not present

## 2015-08-01 DIAGNOSIS — Z8249 Family history of ischemic heart disease and other diseases of the circulatory system: Secondary | ICD-10-CM | POA: Diagnosis not present

## 2015-08-01 DIAGNOSIS — K558 Other vascular disorders of intestine: Secondary | ICD-10-CM | POA: Insufficient documentation

## 2015-08-01 DIAGNOSIS — E079 Disorder of thyroid, unspecified: Secondary | ICD-10-CM | POA: Insufficient documentation

## 2015-08-01 DIAGNOSIS — I2510 Atherosclerotic heart disease of native coronary artery without angina pectoris: Secondary | ICD-10-CM | POA: Insufficient documentation

## 2015-08-01 DIAGNOSIS — K641 Second degree hemorrhoids: Secondary | ICD-10-CM | POA: Diagnosis not present

## 2015-08-01 DIAGNOSIS — D124 Benign neoplasm of descending colon: Secondary | ICD-10-CM | POA: Diagnosis not present

## 2015-08-01 DIAGNOSIS — Z79899 Other long term (current) drug therapy: Secondary | ICD-10-CM | POA: Diagnosis not present

## 2015-08-01 DIAGNOSIS — K635 Polyp of colon: Secondary | ICD-10-CM | POA: Diagnosis not present

## 2015-08-01 DIAGNOSIS — Z91040 Latex allergy status: Secondary | ICD-10-CM | POA: Insufficient documentation

## 2015-08-01 DIAGNOSIS — E119 Type 2 diabetes mellitus without complications: Secondary | ICD-10-CM | POA: Insufficient documentation

## 2015-08-01 DIAGNOSIS — K210 Gastro-esophageal reflux disease with esophagitis: Secondary | ICD-10-CM | POA: Insufficient documentation

## 2015-08-01 DIAGNOSIS — D649 Anemia, unspecified: Secondary | ICD-10-CM | POA: Insufficient documentation

## 2015-08-01 DIAGNOSIS — Z882 Allergy status to sulfonamides status: Secondary | ICD-10-CM | POA: Insufficient documentation

## 2015-08-01 DIAGNOSIS — M5416 Radiculopathy, lumbar region: Secondary | ICD-10-CM | POA: Diagnosis not present

## 2015-08-01 DIAGNOSIS — I110 Hypertensive heart disease with heart failure: Secondary | ICD-10-CM | POA: Insufficient documentation

## 2015-08-01 DIAGNOSIS — R198 Other specified symptoms and signs involving the digestive system and abdomen: Secondary | ICD-10-CM | POA: Insufficient documentation

## 2015-08-01 DIAGNOSIS — I509 Heart failure, unspecified: Secondary | ICD-10-CM | POA: Insufficient documentation

## 2015-08-01 DIAGNOSIS — Z833 Family history of diabetes mellitus: Secondary | ICD-10-CM | POA: Insufficient documentation

## 2015-08-01 DIAGNOSIS — K6389 Other specified diseases of intestine: Secondary | ICD-10-CM | POA: Diagnosis not present

## 2015-08-01 HISTORY — PX: COLONOSCOPY WITH PROPOFOL: SHX5780

## 2015-08-01 SURGERY — COLONOSCOPY WITH PROPOFOL
Anesthesia: General

## 2015-08-01 MED ORDER — PROPOFOL 500 MG/50ML IV EMUL
INTRAVENOUS | Status: DC | PRN
  Administered 2015-08-01: 120 ug/kg/min via INTRAVENOUS

## 2015-08-01 MED ORDER — PROPOFOL 10 MG/ML IV BOLUS
INTRAVENOUS | Status: DC | PRN
  Administered 2015-08-01: 70 mg via INTRAVENOUS

## 2015-08-01 MED ORDER — MIDAZOLAM HCL 5 MG/5ML IJ SOLN
INTRAMUSCULAR | Status: DC | PRN
  Administered 2015-08-01: 1 mg via INTRAVENOUS

## 2015-08-01 MED ORDER — LIDOCAINE 2% (20 MG/ML) 5 ML SYRINGE
INTRAMUSCULAR | Status: DC | PRN
  Administered 2015-08-01: 25 mg via INTRAVENOUS

## 2015-08-01 MED ORDER — SODIUM CHLORIDE 0.9 % IV SOLN
INTRAVENOUS | Status: DC
  Administered 2015-08-01: 1000 mL via INTRAVENOUS

## 2015-08-01 NOTE — Anesthesia Preprocedure Evaluation (Addendum)
Anesthesia Evaluation  Patient identified by MRN, date of birth, ID band Patient awake    Reviewed: Allergy & Precautions, NPO status , Patient's Chart, lab work & pertinent test results  History of Anesthesia Complications Negative for: history of anesthetic complications  Airway Mallampati: III       Dental   Pulmonary neg pulmonary ROS,           Cardiovascular hypertension, Pt. on medications + CAD and +CHF       Neuro/Psych negative neurological ROS     GI/Hepatic negative GI ROS, Neg liver ROS, GERD  Medicated and Controlled,  Endo/Other  diabetes, Type 2, Oral Hypoglycemic AgentsHypothyroidism   Renal/GU Renal Insufficiencynegative Renal ROS     Musculoskeletal   Abdominal   Peds  Hematology  (+) anemia ,   Anesthesia Other Findings   Reproductive/Obstetrics                            Anesthesia Physical Anesthesia Plan  ASA: III  Anesthesia Plan: General   Post-op Pain Management:    Induction: Intravenous  Airway Management Planned: Nasal Cannula  Additional Equipment:   Intra-op Plan:   Post-operative Plan:   Informed Consent: I have reviewed the patients History and Physical, chart, labs and discussed the procedure including the risks, benefits and alternatives for the proposed anesthesia with the patient or authorized representative who has indicated his/her understanding and acceptance.     Plan Discussed with:   Anesthesia Plan Comments:         Anesthesia Quick Evaluation

## 2015-08-01 NOTE — Transfer of Care (Signed)
Immediate Anesthesia Transfer of Care Note  Patient: Erica Larsen  Procedure(s) Performed: Procedure(s): COLONOSCOPY WITH PROPOFOL (N/A)  Patient Location: Endoscopy Unit  Anesthesia Type:General  Level of Consciousness: awake and alert   Airway & Oxygen Therapy: Patient Spontanous Breathing and Patient connected to nasal cannula oxygen  Post-op Assessment: Report given to RN and Post -op Vital signs reviewed and stable  Post vital signs: Reviewed  Last Vitals:  Filed Vitals:   08/01/15 0934 08/01/15 1053  BP: 151/71 124/71  Pulse: 75 61  Temp: 36.1 C 36.2 C  Resp: 18 18    Last Pain: There were no vitals filed for this visit.       Complications: No apparent anesthesia complications

## 2015-08-01 NOTE — H&P (Signed)
Lucilla Lame, MD [facility] [address] [place], [place] [address] Phone: [phone] Fax : [phone]  Primary Care Physician:  Kathrine Haddock, NP Primary Gastroenterologist:  Dr. Allen Norris  Pre-Procedure History & Physical: HPI:  Erica Larsen is a 65 y.o. female is here for an colonoscopy.   Past Medical History  Diagnosis Date  . Hypertension   . Diabetes mellitus without complication ([place])   . High cholesterol   . Thyroid disease   . GERD (gastroesophageal reflux disease)   . Coronary artery disease   . Diverticulosis   . Osteopenia     Hips  . Esophagitis   . Lumbar radiculopathy   . Hypercalcemia   . Obesity   . Arthritis     osteoarthritis  . CHF (congestive heart failure) [facility])     Past Surgical History  Procedure Laterality Date  . Coronary angioplasty with stent placement    . Givens capsule study N/A 06/01/2015    Procedure: GIVENS CAPSULE STUDY;  Surgeon: Lucilla Lame, MD;  Location: [facility] ENDOSCOPY;  Service: Endoscopy;  Laterality: N/A;    Prior to Admission medications   Medication Sig Start Date End Date Taking? Authorizing Provider  metoprolol (LOPRESSOR) 50 MG tablet Take 50 mg by mouth daily.   Yes Historical Provider, MD  amLODipine (NORVASC) 2.5 MG tablet Take 1 tablet (2.5 mg total) by mouth daily. 06/13/15   Kathrine Haddock, NP  aspirin 81 MG tablet Take 81 mg by mouth every morning.     Historical Provider, MD  atorvastatin (LIPITOR) 40 MG tablet Take 40 mg by mouth daily.    Historical Provider, MD  chlorpheniramine-HYDROcodone (TUSSIONEX PENNKINETIC ER) 10-8 MG/5ML SUER Take 5 mLs by mouth 2 (two) times daily. 05/30/15   Paulette Blanch, MD  clopidogrel (PLAVIX) 75 MG tablet Take 75 mg by mouth every morning.     Historical Provider, MD  furosemide (LASIX) 40 MG tablet Take 1 tablet (40 mg total) by mouth daily. 06/22/15   Kathrine Haddock, NP  lansoprazole (PREVACID) 30 MG capsule Take 1 capsule (30 mg total) by mouth daily at 12 noon. 03/08/15   Kathrine Haddock, NP  levothyroxine (SYNTHROID, LEVOTHROID) 100 MCG tablet Take 1 tablet (100 mcg total) by mouth daily. 06/20/15   Kathrine Haddock, NP  olmesartan (BENICAR) 40 MG tablet Take 1 tablet (40 mg total) by mouth daily. 06/13/15   Kathrine Haddock, NP  Potassium Chloride ER 20 MEQ TBCR Take 20 mEq by mouth daily. 12/14/14   Alisa Graff, FNP  raloxifene (EVISTA) 60 MG tablet Take 1 tablet (60 mg total) by mouth daily. 03/08/15   Kathrine Haddock, NP    Allergies as of 07/03/2015 - Review Complete 05/30/2015  Allergen Reaction Noted  . Latex  08/07/2014  . Sulfa antibiotics  08/07/2014    Family History  Problem Relation Age of Onset  . Heart disease Mother   . Kidney failure Mother   . Hypertension Mother   . Diabetes Mother   . Heart attack Father   . Hypertension Father   . Thyroid disease Father   . Hypertension Sister   . Diabetes Sister   . Thyroid disease Daughter   . Thyroid disease Daughter     Social History   Social History  . Marital Status: Married    Spouse Name: N/A  . Number of Children: N/A  . Years of Education: N/A   Occupational History  . Not on file.   Social History Main Topics  .  Smoking status: Never Smoker   . Smokeless tobacco: Never Used  . Alcohol Use: No  . Drug Use: No  . Sexual Activity: Yes    Birth Control/ Protection: Post-menopausal   Other Topics Concern  . Not on file   Social History Narrative    Review of Systems: See HPI, otherwise negative ROS  Physical Exam: BP 151/71 mmHg  Pulse 75  Temp(Src) 97 F (36.1 C) (Tympanic)  Resp 18  SpO2 99% General:   Alert,  pleasant and cooperative in NAD Head:  Normocephalic and atraumatic. Neck:  Supple; no masses or thyromegaly. Lungs:  Clear throughout to auscultation.    Heart:  Regular rate and rhythm. Abdomen:  Soft, nontender and nondistended. Normal bowel sounds, without guarding, and without rebound.   Neurologic:  Alert and  oriented x4;  grossly normal  neurologically.  Impression/Plan: Erica Larsen is here for an colonoscopy to be performed for abnormal capsule endoscopy  Risks, benefits, limitations, and alternatives regarding  colonoscopy have been reviewed with the patient.  Questions have been answered.  All parties agreeable.   Lucilla Lame, MD  08/01/2015, 10:20 AM

## 2015-08-01 NOTE — Op Note (Signed)
[facility] Gastroenterology Patient Name: Erica Larsen Procedure Date: 08/01/2015 10:23 AM MRN: [id number] Account #: [id number] Date of Birth: May 03, 1950 Admit Type: Outpatient Age: 65 Room: [facility] ENDO ROOM 4 Gender: Female Note Status: Finalized Procedure:            Colonoscopy Indications:          Evaluation on imaging study of clinically significant                        abnormality Providers:            Lucilla Lame, MD Referring MD:         Kathrine Haddock, PA (Referring MD) Medicines:            Propofol per Anesthesia Complications:        No immediate complications. Procedure:            Pre-Anesthesia Assessment:                       - Prior to the procedure, a History and Physical was                        performed, and patient medications and allergies were                        reviewed. The patient's tolerance of previous                        anesthesia was also reviewed. The risks and benefits of                        the procedure and the sedation options and risks were                        discussed with the patient. All questions were                        answered, and informed consent was obtained. Prior                        Anticoagulants: The patient has taken no previous                        anticoagulant or antiplatelet agents. ASA Grade                        Assessment: II - A patient with mild systemic disease.                        After reviewing the risks and benefits, the patient was                        deemed in satisfactory condition to undergo the                        procedure.                       After obtaining informed consent, the colonoscope was  passed under direct vision. Throughout the procedure,                        the patient's blood pressure, pulse, and oxygen                        saturations were monitored continuously. The                        Colonoscope was  introduced through the anus and                        advanced to the the terminal ileum. The colonoscopy was                        performed without difficulty. The patient tolerated the                        procedure well. The quality of the bowel preparation                        was good. Findings:      The perianal and digital rectal examinations were normal.      The terminal ileum contained a localized area of increased mucosa       vascular pattern.      Two sessile polyps were found in the descending colon. The polyps were 2       to 3 mm in size. These polyps were removed with a cold snare. Resection       and retrieval were complete.      A few small-mouthed diverticula were found in the entire colon.      Non-bleeding internal hemorrhoids were found during retroflexion. The       hemorrhoids were Grade II (internal hemorrhoids that prolapse but reduce       spontaneously). Impression:           - Increased mucosa vascular pattern in the terminal                        ileum.                       - Two 2 to 3 mm polyps in the descending colon, removed                        with a cold snare. Resected and retrieved.                       - Diverticulosis in the entire examined colon.                       - Non-bleeding internal hemorrhoids. Recommendation:       - Await pathology results.                       - Repeat colonoscopy in 5 years if polyp adenoma and 10                        years if hyperplastic Procedure Code(s):    --- Professional ---  45385, Colonoscopy, flexible; with removal of tumor(s),                        polyp(s), or other lesion(s) by snare technique Diagnosis Code(s):    --- Professional ---                       R93.3, Abnormal findings on diagnostic imaging of other                        parts of digestive tract                       D12.4, Benign neoplasm of descending colon CPT copyright 2016 American Medical  Association. All rights reserved. The codes documented in this report are preliminary and upon coder review may  be revised to meet current compliance requirements. Lucilla Lame, MD 08/01/2015 10:52:09 AM This report has been signed electronically. Number of Addenda: 0 Note Initiated On: 08/01/2015 10:23 AM Scope Withdrawal Time: 0 hours 10 minutes 22 seconds  Total Procedure Duration: 0 hours 13 minutes 55 seconds       [facility]

## 2015-08-01 NOTE — Anesthesia Postprocedure Evaluation (Signed)
Anesthesia Post Note  Patient: Erica Larsen  Procedure(s) Performed: Procedure(s) (LRB): COLONOSCOPY WITH PROPOFOL (N/A)  Patient location during evaluation: Endoscopy Anesthesia Type: General Level of consciousness: awake and alert Pain management: pain level controlled Vital Signs Assessment: post-procedure vital signs reviewed and stable Respiratory status: spontaneous breathing and respiratory function stable Cardiovascular status: stable Anesthetic complications: no    Last Vitals:  Filed Vitals:   08/01/15 1053 08/01/15 1100  BP: 124/71 126/56  Pulse: 61 58  Temp: 36.2 C   Resp: 18 15    Last Pain: There were no vitals filed for this visit.               KEPHART,WILLIAM K

## 2015-08-02 ENCOUNTER — Encounter: Payer: Self-pay | Admitting: Gastroenterology

## 2015-08-02 LAB — SURGICAL PATHOLOGY

## 2015-08-07 ENCOUNTER — Encounter: Payer: Self-pay | Admitting: Gastroenterology

## 2015-08-07 DIAGNOSIS — E782 Mixed hyperlipidemia: Secondary | ICD-10-CM | POA: Diagnosis not present

## 2015-08-07 DIAGNOSIS — I2510 Atherosclerotic heart disease of native coronary artery without angina pectoris: Secondary | ICD-10-CM | POA: Diagnosis not present

## 2015-08-07 DIAGNOSIS — I420 Dilated cardiomyopathy: Secondary | ICD-10-CM | POA: Diagnosis not present

## 2015-08-07 DIAGNOSIS — I10 Essential (primary) hypertension: Secondary | ICD-10-CM | POA: Diagnosis not present

## 2015-08-07 DIAGNOSIS — I340 Nonrheumatic mitral (valve) insufficiency: Secondary | ICD-10-CM | POA: Diagnosis not present

## 2015-08-11 ENCOUNTER — Ambulatory Visit: Payer: Medicare Other | Admitting: Unknown Physician Specialty

## 2015-09-11 ENCOUNTER — Other Ambulatory Visit: Payer: Self-pay | Admitting: Unknown Physician Specialty

## 2015-09-18 ENCOUNTER — Encounter: Payer: Self-pay | Admitting: Unknown Physician Specialty

## 2015-09-18 ENCOUNTER — Ambulatory Visit (INDEPENDENT_AMBULATORY_CARE_PROVIDER_SITE_OTHER): Payer: Medicare Other | Admitting: Unknown Physician Specialty

## 2015-09-18 VITALS — BP 133/82 | HR 71 | Temp 98.1°F | Ht 62.7 in | Wt 210.4 lb

## 2015-09-18 DIAGNOSIS — K222 Esophageal obstruction: Secondary | ICD-10-CM | POA: Diagnosis not present

## 2015-09-18 DIAGNOSIS — K219 Gastro-esophageal reflux disease without esophagitis: Secondary | ICD-10-CM | POA: Insufficient documentation

## 2015-09-18 DIAGNOSIS — E785 Hyperlipidemia, unspecified: Secondary | ICD-10-CM

## 2015-09-18 DIAGNOSIS — R1310 Dysphagia, unspecified: Secondary | ICD-10-CM | POA: Diagnosis not present

## 2015-09-18 DIAGNOSIS — I10 Essential (primary) hypertension: Secondary | ICD-10-CM

## 2015-09-18 DIAGNOSIS — Z0000 Encounter for general adult medical examination without abnormal findings: Secondary | ICD-10-CM

## 2015-09-18 DIAGNOSIS — N183 Chronic kidney disease, stage 3 (moderate): Secondary | ICD-10-CM

## 2015-09-18 DIAGNOSIS — I5032 Chronic diastolic (congestive) heart failure: Secondary | ICD-10-CM | POA: Diagnosis not present

## 2015-09-18 DIAGNOSIS — E1122 Type 2 diabetes mellitus with diabetic chronic kidney disease: Secondary | ICD-10-CM | POA: Diagnosis not present

## 2015-09-18 DIAGNOSIS — R7301 Impaired fasting glucose: Secondary | ICD-10-CM | POA: Diagnosis not present

## 2015-09-18 LAB — LIPID PANEL PICCOLO, WAIVED
Chol/HDL Ratio Piccolo,Waive: 2 mg/dL
Cholesterol Piccolo, Waived: 132 mg/dL (ref <200)
HDL CHOL PICCOLO, WAIVED: 67 mg/dL (ref >59)
LDL Chol Calc Piccolo Waived: 46 mg/dL (ref <100)
Triglycerides Piccolo,Waived: 95 mg/dL (ref <150)
VLDL CHOL CALC PICCOLO,WAIVE: 19 mg/dL (ref <30)

## 2015-09-18 LAB — BAYER DCA HB A1C WAIVED: HB A1C (BAYER DCA - WAIVED): 7.1 % — ABNORMAL HIGH (ref <7.0)

## 2015-09-18 MED ORDER — METFORMIN HCL 500 MG PO TABS
500.0000 mg | ORAL_TABLET | Freq: Two times a day (BID) | ORAL | 3 refills | Status: DC
Start: 2015-09-18 — End: 2016-05-31

## 2015-09-18 MED ORDER — LANSOPRAZOLE 30 MG PO CPDR: 30.0000 mg | DELAYED_RELEASE_CAPSULE | Freq: Every day | ORAL | 1 refills | Status: DC

## 2015-09-18 NOTE — Assessment & Plan Note (Signed)
LDL is 46.  At goal.  Continue present medications

## 2015-09-18 NOTE — Assessment & Plan Note (Signed)
Stable, continue present medications.   

## 2015-09-18 NOTE — Patient Instructions (Addendum)
Diabetes and Exercise Exercising regularly is important. It is not just about losing weight. It has many health benefits, such as:  Improving your overall fitness, flexibility, and endurance.  Increasing your bone density.  Helping with weight control.  Decreasing your body fat.  Increasing your muscle strength.  Reducing stress and tension.  Improving your overall health. People with diabetes who exercise gain additional benefits because exercise:  Reduces appetite.  Improves the body's use of blood sugar (glucose).  Helps lower or control blood glucose.  Decreases blood pressure.  Helps control blood lipids (such as cholesterol and triglycerides).  Improves the body's use of the hormone insulin by:  Increasing the body's insulin sensitivity.  Reducing the body's insulin needs.  Decreases the risk for heart disease because exercising:  Lowers cholesterol and triglycerides levels.  Increases the levels of good cholesterol (such as high-density lipoproteins [HDL]) in the body.  Lowers blood glucose levels. YOUR ACTIVITY PLAN  Choose an activity that you enjoy, and set realistic goals. To exercise safely, you should begin practicing any new physical activity slowly, and gradually increase the intensity of the exercise over time. Your health care provider or diabetes educator can help create an activity plan that works for you. General recommendations include:  Encouraging children to engage in at least 60 minutes of physical activity each day.  Stretching and performing strength training exercises, such as yoga or weight lifting, at least 2 times per week.  Performing a total of at least 150 minutes of moderate-intensity exercise each week, such as brisk walking or water aerobics.  Exercising at least 3 days per week, making sure you allow no more than 2 consecutive days to pass without exercising.  Avoiding long periods of inactivity (90 minutes or more). When you  have to spend an extended period of time sitting down, take frequent breaks to walk or stretch. RECOMMENDATIONS FOR EXERCISING WITH TYPE 1 OR TYPE 2 DIABETES   Check your blood glucose before exercising. If blood glucose levels are greater than 240 mg/dL, check for urine ketones. Do not exercise if ketones are present.  Avoid injecting insulin into areas of the body that are going to be exercised. For example, avoid injecting insulin into:  The arms when playing tennis.  The legs when jogging.  Keep a record of:  Food intake before and after you exercise.  Expected peak times of insulin action.  Blood glucose levels before and after you exercise.  The type and amount of exercise you have done.  Review your records with your health care provider. Your health care provider will help you to develop guidelines for adjusting food intake and insulin amounts before and after exercising.  If you take insulin or oral hypoglycemic agents, watch for signs and symptoms of hypoglycemia. They include:  Dizziness.  Shaking.  Sweating.  Chills.  Confusion.  Drink plenty of water while you exercise to prevent dehydration or heat stroke. Body water is lost during exercise and must be replaced.  Talk to your health care provider before starting an exercise program to make sure it is safe for you. Remember, almost any type of activity is better than none.   This information is not intended to replace advice given to you by your health care provider. Make sure you discuss any questions you have with your health care provider.   Document Released: 04/27/2003 Document Revised: 06/21/2014 Document Reviewed: 07/14/2012 Elsevier Interactive Patient Education 2016 [employer]. Diabetes Mellitus and Food It is important for  you to manage your blood sugar (glucose) level. Your blood glucose level can be greatly affected by what you eat. Eating healthier foods in the appropriate amounts throughout  the day at about the same time each day will help you control your blood glucose level. It can also help slow or prevent worsening of your diabetes mellitus. Healthy eating may even help you improve the level of your blood pressure and reach or maintain a healthy weight.  General recommendations for healthful eating and cooking habits include:  Eating meals and snacks regularly. Avoid going long periods of time without eating to lose weight.  Eating a diet that consists mainly of plant-based foods, such as fruits, vegetables, nuts, legumes, and whole grains.  Using low-heat cooking methods, such as baking, instead of high-heat cooking methods, such as deep frying. Work with your dietitian to make sure you understand how to use the Nutrition Facts information on food labels. HOW CAN FOOD AFFECT ME? Carbohydrates Carbohydrates affect your blood glucose level more than any other type of food. Your dietitian will help you determine how many carbohydrates to eat at each meal and teach you how to count carbohydrates. Counting carbohydrates is important to keep your blood glucose at a healthy level, especially if you are using insulin or taking certain medicines for diabetes mellitus. Alcohol Alcohol can cause sudden decreases in blood glucose (hypoglycemia), especially if you use insulin or take certain medicines for diabetes mellitus. Hypoglycemia can be a life-threatening condition. Symptoms of hypoglycemia (sleepiness, dizziness, and disorientation) are similar to symptoms of having too much alcohol.  If your health care provider has given you approval to drink alcohol, do so in moderation and use the following guidelines:  Women should not have more than one drink per day, and men should not have more than two drinks per day. One drink is equal to:  12 oz of beer.  5 oz of wine.  1 oz of hard liquor.  Do not drink on an empty stomach.  Keep yourself hydrated. Have water, diet soda, or  unsweetened iced tea.  Regular soda, juice, and other mixers might contain a lot of carbohydrates and should be counted. WHAT FOODS ARE NOT RECOMMENDED? As you make food choices, it is important to remember that all foods are not the same. Some foods have fewer nutrients per serving than other foods, even though they might have the same number of calories or carbohydrates. It is difficult to get your body what it needs when you eat foods with fewer nutrients. Examples of foods that you should avoid that are high in calories and carbohydrates but low in nutrients include:  Trans fats (most processed foods list trans fats on the Nutrition Facts label).  Regular soda.  Juice.  Candy.  Sweets, such as cake, pie, doughnuts, and cookies.  Fried foods. WHAT FOODS CAN I EAT? Eat nutrient-rich foods, which will nourish your body and keep you healthy. The food you should eat also will depend on several factors, including:  The calories you need.  The medicines you take.  Your weight.  Your blood glucose level.  Your blood pressure level.  Your cholesterol level. You should eat a variety of foods, including:  Protein.  Lean cuts of meat.  Proteins low in saturated fats, such as fish, egg whites, and beans. Avoid processed meats.  Fruits and vegetables.  Fruits and vegetables that may help control blood glucose levels, such as apples, mangoes, and yams.  Dairy products.  Choose fat-free  or low-fat dairy products, such as milk, yogurt, and cheese.  Grains, bread, pasta, and rice.  Choose whole grain products, such as multigrain bread, whole oats, and brown rice. These foods may help control blood pressure.  Fats.  Foods containing healthful fats, such as nuts, avocado, olive oil, canola oil, and fish. DOES EVERYONE WITH DIABETES MELLITUS HAVE THE SAME MEAL PLAN? Because every person with diabetes mellitus is different, there is not one meal plan that works for everyone. It  is very important that you meet with a dietitian who will help you create a meal plan that is just right for you.   This information is not intended to replace advice given to you by your health care provider. Make sure you discuss any questions you have with your health care provider.   Document Released: 11/01/2004 Document Revised: 02/25/2014 Document Reviewed: 01/01/2013 Elsevier Interactive Patient Education [employer].

## 2015-09-18 NOTE — Assessment & Plan Note (Addendum)
Hgb A1C is 7.1.  She had been on Actos in the past.  Start Metformin BID.  Work on diet and exercise.  Refusing diabetes classes.

## 2015-09-18 NOTE — Progress Notes (Signed)
BP 133/82 (BP Location: Left Arm, Cuff Size: Large)   Pulse 71   Temp 98.1 F (36.7 C)   Ht 5' 2.7" (1.593 m)   Wt 210 lb 6.4 oz (95.4 kg)   SpO2 96%   BMI 37.63 kg/m    Subjective:    Patient ID: Erica Larsen, female    DOB: 02-13-1951, 65 y.o.   MRN: [id number]  HPI: Erica Larsen is a 65 y.o. female  Chief Complaint  Patient presents with  . Hyperlipidemia  . Hypertension   Hypertension Using medications without difficulty Average home BPs Not checking  No problems or lightheadedness No chest pain with exertion or shortness of breath No Edema   Hyperlipidemia Using medications without problems No Muscle aches  Diet compliance: Tries to eat in moderation Exercise: None  IFG Last Hgb A1C was 6.1  Dysphagia Has acid reflux.  Sometimes has to throw up her food.  Refusing GI consult for now  Relevant past medical, surgical, family and social history reviewed and updated as indicated. Interim medical history since our last visit reviewed. Allergies and medications reviewed and updated.  Review of Systems  Per HPI unless specifically indicated above     Objective:    BP 133/82 (BP Location: Left Arm, Cuff Size: Large)   Pulse 71   Temp 98.1 F (36.7 C)   Ht 5' 2.7" (1.593 m)   Wt 210 lb 6.4 oz (95.4 kg)   SpO2 96%   BMI 37.63 kg/m   Wt Readings from Last 3 Encounters:  09/18/15 210 lb 6.4 oz (95.4 kg)  07/13/15 212 lb 3.1 oz (96.2 kg)  05/30/15 220 lb (99.8 kg)    Physical Exam  Constitutional: She is oriented to person, place, and time. She appears well-developed and well-nourished. No distress.  HENT:  Head: Normocephalic and atraumatic.  Eyes: Conjunctivae and lids are normal. Right eye exhibits no discharge. Left eye exhibits no discharge. No scleral icterus.  Neck: Normal range of motion. Neck supple. No JVD present. Carotid bruit is not present.  Cardiovascular: Normal rate, regular rhythm and normal heart sounds.   Pulmonary/Chest:  Effort normal and breath sounds normal.  Abdominal: Normal appearance. There is no splenomegaly or hepatomegaly.  Musculoskeletal: Normal range of motion.  Neurological: She is alert and oriented to person, place, and time.  Skin: Skin is warm, dry and intact. No rash noted. No pallor.  Psychiatric: She has a normal mood and affect. Her behavior is normal. Judgment and thought content normal.      Assessment & Plan:   Problem List Items Addressed This Visit      Unprioritized   Chronic diastolic heart failure (HCC)   Relevant Medications   aspirin 81 MG tablet   atorvastatin (LIPITOR) 80 MG tablet   Diabetes type 2, controlled (HCC)    Hgb A1C is 7.1.  She had been on Actos in the past.  Start Metformin BID.  Work on diet and exercise.  Refusing diabetes classes.        Relevant Medications   aspirin 81 MG tablet   atorvastatin (LIPITOR) 80 MG tablet   metFORMIN (GLUCOPHAGE) 500 MG tablet   Dysphagia    Refusing GI      Essential hypertension    Stable, continue present medications.        Relevant Medications   aspirin 81 MG tablet   atorvastatin (LIPITOR) 80 MG tablet   Other Relevant Orders   Comprehensive metabolic panel  GERD with stricture    Refill reflux medications.        Relevant Medications   lansoprazole (PREVACID) 30 MG capsule   Hyperlipemia    LDL is 46.  At goal.  Continue present medications      Relevant Medications   aspirin 81 MG tablet   atorvastatin (LIPITOR) 80 MG tablet   Other Relevant Orders   Lipid Panel Piccolo, Waived (Completed)    Other Visit Diagnoses    Health care maintenance    -  Primary   Relevant Orders   Hepatitis C antibody   IFG (impaired fasting glucose)       Relevant Orders   Bayer DCA Hb A1c Waived (Completed)       Follow up plan: Return in about 3 months (around 12/19/2015).

## 2015-09-18 NOTE — Assessment & Plan Note (Signed)
Refusing GI

## 2015-09-18 NOTE — Assessment & Plan Note (Signed)
Refill reflux medications.

## 2015-09-19 ENCOUNTER — Telehealth: Payer: Self-pay | Admitting: Unknown Physician Specialty

## 2015-09-19 DIAGNOSIS — R7401 Elevation of levels of liver transaminase levels: Secondary | ICD-10-CM | POA: Insufficient documentation

## 2015-09-19 DIAGNOSIS — R748 Abnormal levels of other serum enzymes: Secondary | ICD-10-CM

## 2015-09-19 DIAGNOSIS — R740 Nonspecific elevation of levels of transaminase and lactic acid dehydrogenase [LDH]: Secondary | ICD-10-CM

## 2015-09-19 LAB — COMPREHENSIVE METABOLIC PANEL
A/G RATIO: 1.7 (ref 1.2–2.2)
ALBUMIN: 4.3 g/dL (ref 3.6–4.8)
ALT: 88 IU/L — ABNORMAL HIGH (ref 0–32)
AST: 99 IU/L — AB (ref 0–40)
Alkaline Phosphatase: 117 IU/L (ref 39–117)
BUN / CREAT RATIO: 11 — AB (ref 12–28)
BUN: 12 mg/dL (ref 8–27)
Bilirubin Total: 0.6 mg/dL (ref 0.0–1.2)
CALCIUM: 9.5 mg/dL (ref 8.7–10.3)
CO2: 26 mmol/L (ref 18–29)
Chloride: 101 mmol/L (ref 96–106)
Creatinine, Ser: 1.11 mg/dL — ABNORMAL HIGH (ref 0.57–1.00)
GFR calc Af Amer: 59 mL/min/{1.73_m2} — ABNORMAL LOW (ref >59)
GFR, EST NON AFRICAN AMERICAN: 52 mL/min/{1.73_m2} — AB (ref >59)
GLOBULIN, TOTAL: 2.6 g/dL (ref 1.5–4.5)
Glucose: 117 mg/dL — ABNORMAL HIGH (ref 65–99)
POTASSIUM: 4.9 mmol/L (ref 3.5–5.2)
SODIUM: 145 mmol/L — AB (ref 134–144)
Total Protein: 6.9 g/dL (ref 6.0–8.5)

## 2015-09-19 NOTE — Telephone Encounter (Signed)
Discussed with patient elevated liver enzymes.  No new medications in the last month.  Will order [place] of liver.  Pt states she drinks alcohol rarely.  Recheck liver enzymes with GGT in 3 months

## 2015-09-20 ENCOUNTER — Other Ambulatory Visit: Payer: Self-pay | Admitting: Unknown Physician Specialty

## 2015-09-20 LAB — HEPATITIS C ANTIBODY: Hep C Virus Ab: <0.1 s/co ratio (ref 0.0–0.9)

## 2015-09-20 NOTE — Telephone Encounter (Signed)
Your patient 

## 2015-09-21 ENCOUNTER — Telehealth: Payer: Self-pay | Admitting: Unknown Physician Specialty

## 2015-09-21 NOTE — Telephone Encounter (Signed)
Pt would like clarification on the dosage of her metformin.

## 2015-09-22 ENCOUNTER — Ambulatory Visit: Payer: Medicare Other

## 2015-09-22 NOTE — Telephone Encounter (Signed)
500 mg is the correct dose

## 2015-09-22 NOTE — Telephone Encounter (Signed)
Called and spoke to patient. She stated that she was thinking that metformin was supposed to be 100 mg instead of 500 mg. I told the patient that I didn't think there was 100 mg of metformin and patient wanted me to double check with provider to make sure that was was written is correct.

## 2015-09-22 NOTE — Telephone Encounter (Signed)
Called and left patient a voicemail letting her know that what Erica Larsen wrote for was the correct dose. I asked for her to call us back if she has any questions or concerns.

## 2015-09-26 ENCOUNTER — Ambulatory Visit
Admission: RE | Admit: 2015-09-26 | Discharge: 2015-09-26 | Disposition: A | Discharge status: Home / Self Care | Payer: PRIVATE HEALTH INSURANCE | Source: Ambulatory Visit | Attending: Unknown Physician Specialty | Admitting: Unknown Physician Specialty

## 2015-09-26 DIAGNOSIS — R748 Abnormal levels of other serum enzymes: Secondary | ICD-10-CM

## 2015-09-26 DIAGNOSIS — K760 Fatty (change of) liver, not elsewhere classified: Secondary | ICD-10-CM | POA: Diagnosis not present

## 2015-10-25 ENCOUNTER — Telehealth: Payer: Self-pay | Admitting: Unknown Physician Specialty

## 2015-10-25 DIAGNOSIS — R300 Dysuria: Secondary | ICD-10-CM

## 2015-10-25 NOTE — Telephone Encounter (Signed)
Pt called and has a bladder infection and would like to have something sent to [facility] [address]

## 2015-10-25 NOTE — Telephone Encounter (Signed)
Called and spoke to patient to get a little more information. Patient states she has been having some burning with urination and states she first noticed the burning Friday but states it has gotten worse.

## 2015-10-26 NOTE — Telephone Encounter (Signed)
Patient returned call. I let her know what Dr. Wynetta Emery said and patient stated that she would come by and leave a urine sample in the morning.

## 2015-10-26 NOTE — Telephone Encounter (Signed)
May come in for urine sample or to see Erica Larsen. Her choice. I do not call in abx without that. Let me know and I'll put in the order if she just wants to get a urine done.

## 2015-10-26 NOTE — Telephone Encounter (Signed)
Called and left patient a voicemail asking for her to please return my call.  

## 2015-10-27 ENCOUNTER — Other Ambulatory Visit: Payer: Medicare Other

## 2015-10-27 ENCOUNTER — Telehealth: Payer: Self-pay | Admitting: Family Medicine

## 2015-10-27 DIAGNOSIS — R300 Dysuria: Secondary | ICD-10-CM | POA: Diagnosis not present

## 2015-10-27 MED ORDER — CIPROFLOXACIN HCL 250 MG PO TABS: 250.0000 mg | ORAL_TABLET | Freq: Two times a day (BID) | ORAL | 0 refills | Status: DC

## 2015-10-27 NOTE — Telephone Encounter (Signed)
Please let her know that she did have a UTI, and I've sent her through medicine to the [facility] on [address]. If she's resistant to the abx we gave her, I'll change it when we get the culture back. Thanks!

## 2015-10-27 NOTE — Telephone Encounter (Signed)
Spoke with patient, she will pick up ABX

## 2015-11-01 LAB — URINE CULTURE, REFLEX

## 2015-11-01 LAB — UA/M W/RFLX CULTURE, ROUTINE
BILIRUBIN UA: NEGATIVE
Glucose, UA: NEGATIVE
NITRITE UA: NEGATIVE
PH UA: 5 (ref 5.0–7.5)
Specific Gravity, UA: 1.015 (ref 1.005–1.030)
UUROB: 0.2 mg/dL (ref 0.2–1.0)

## 2015-11-01 LAB — MICROSCOPIC EXAMINATION: RBC, UA: >30 /hpf — AB (ref 0–?)

## 2015-12-19 ENCOUNTER — Encounter: Payer: Self-pay | Admitting: Unknown Physician Specialty

## 2015-12-19 ENCOUNTER — Ambulatory Visit (INDEPENDENT_AMBULATORY_CARE_PROVIDER_SITE_OTHER): Payer: PRIVATE HEALTH INSURANCE | Admitting: Unknown Physician Specialty

## 2015-12-19 VITALS — BP 135/73 | HR 69 | Temp 98.2°F | Ht 62.2 in | Wt 205.8 lb

## 2015-12-19 DIAGNOSIS — E7800 Pure hypercholesterolemia, unspecified: Secondary | ICD-10-CM | POA: Diagnosis not present

## 2015-12-19 DIAGNOSIS — N183 Chronic kidney disease, stage 3 unspecified: Secondary | ICD-10-CM

## 2015-12-19 DIAGNOSIS — I10 Essential (primary) hypertension: Secondary | ICD-10-CM

## 2015-12-19 DIAGNOSIS — Z23 Encounter for immunization: Secondary | ICD-10-CM

## 2015-12-19 DIAGNOSIS — E1122 Type 2 diabetes mellitus with diabetic chronic kidney disease: Secondary | ICD-10-CM | POA: Insufficient documentation

## 2015-12-19 LAB — BAYER DCA HB A1C WAIVED: HB A1C (BAYER DCA - WAIVED): 6.7 % (ref <7.0)

## 2015-12-19 MED ORDER — AMLODIPINE BESYLATE 2.5 MG PO TABS
2.5000 mg | ORAL_TABLET | Freq: Every day | ORAL | 5 refills | Status: DC
Start: 2015-12-19 — End: 2016-07-03

## 2015-12-19 MED ORDER — RALOXIFENE HCL 60 MG PO TABS: 60.0000 mg | ORAL_TABLET | Freq: Every day | ORAL | 5 refills | Status: DC

## 2015-12-19 MED ORDER — FUROSEMIDE 40 MG PO TABS: 40.0000 mg | ORAL_TABLET | Freq: Every day | ORAL | 5 refills | Status: DC

## 2015-12-19 MED ORDER — OLMESARTAN MEDOXOMIL 40 MG PO TABS
40.0000 mg | ORAL_TABLET | Freq: Every day | ORAL | 5 refills | Status: DC
Start: 2015-12-19 — End: 2016-07-03

## 2015-12-19 NOTE — Assessment & Plan Note (Signed)
Hgb A1C is 6.7.  Continue Metformin as tolerate

## 2015-12-19 NOTE — Assessment & Plan Note (Signed)
Hgb A1 C is 6.7. Continue Metformin as tolerated

## 2015-12-19 NOTE — Assessment & Plan Note (Signed)
Stable, continue present medications.   

## 2015-12-19 NOTE — Progress Notes (Signed)
BP 135/73 (BP Location: Left Arm, Cuff Size: Large)   Pulse 69   Temp 98.2 F (36.8 C)   Ht 5' 2.2" (1.58 m) Comment: pt had shoes on  Wt 205 lb 12.8 oz (93.4 kg) Comment: pt had shoes on  SpO2 98%   BMI 37.40 kg/m    Subjective:    Patient ID: Erica Larsen, female    DOB: Feb 17, 1951, 65 y.o.   MRN: [id number]  HPI: Erica Larsen is a 65 y.o. female  Chief Complaint  Patient presents with  . Diabetes    pt states she does not know when last eye exam was   . Hyperlipidemia  . Hypertension  . Hypothyroidism   Diabetes: Not taking the Metformin BID due to to diarrhea.  Taking one in the afternoon No hypoglycemic episodes No hyperglycemic episodes Feet problems:none Blood Sugars averaging: eye exam within last year Last Hgb A1C:8.1 Not working more on diet and not exercising but lost 5 pounds States she would not be able to go to the diabetes classes due to trans portion issues  Hypertension  Using medications without difficulty Average home BPs   Using medication without problems or lightheadedness No chest pain with exertion or shortness of breath No Edema  Elevated Cholesterol Using medications without problems No Muscle aches     Relevant past medical, surgical, family and social history reviewed and updated as indicated. Interim medical history since our last visit reviewed. Allergies and medications reviewed and updated.  Review of Systems  Per HPI unless specifically indicated above     Objective:    BP 135/73 (BP Location: Left Arm, Cuff Size: Large)   Pulse 69   Temp 98.2 F (36.8 C)   Ht 5' 2.2" (1.58 m) Comment: pt had shoes on  Wt 205 lb 12.8 oz (93.4 kg) Comment: pt had shoes on  SpO2 98%   BMI 37.40 kg/m   Wt Readings from Last 3 Encounters:  12/19/15 205 lb 12.8 oz (93.4 kg)  09/18/15 210 lb 6.4 oz (95.4 kg)  07/13/15 212 lb 3.1 oz (96.2 kg)    Physical Exam  Constitutional: She is oriented to person, place, and time. She  appears well-developed and well-nourished. No distress.  HENT:  Head: Normocephalic and atraumatic.  Eyes: Conjunctivae and lids are normal. Right eye exhibits no discharge. Left eye exhibits no discharge. No scleral icterus.  Neck: Normal range of motion. Neck supple. No JVD present. Carotid bruit is not present.  Cardiovascular: Normal rate, regular rhythm and normal heart sounds.   Pulmonary/Chest: Effort normal and breath sounds normal.  Abdominal: Normal appearance. There is no splenomegaly or hepatomegaly.  Musculoskeletal: Normal range of motion.  Neurological: She is alert and oriented to person, place, and time.  Skin: Skin is warm, dry and intact. No rash noted. No pallor.  Psychiatric: She has a normal mood and affect. Her behavior is normal. Judgment and thought content normal.    Results for orders placed or performed in visit on 10/27/15  Microscopic Examination  Result Value Ref Range   WBC, UA >30 (A) 0 - 5 /hpf   RBC, UA >30 (A) 0 - 2 /hpf   Epithelial Cells (non renal) 0-10 0 - 10 /hpf   Bacteria, UA Few None seen/Few  UA/M w/rflx Culture, Routine  Result Value Ref Range   Specific Gravity, UA 1.015 1.005 - 1.030   pH, UA 5.0 5.0 - 7.5   Color, UA Yellow Yellow  Appearance Ur Cloudy (A) Clear   Leukocytes, UA 3+ (A) Negative   Protein, UA 3+ (A) Negative/Trace   Glucose, UA Negative Negative   Ketones, UA Trace (A) Negative   RBC, UA 3+ (A) Negative   Bilirubin, UA Negative Negative   Urobilinogen, Ur 0.2 0.2 - 1.0 mg/dL   Nitrite, UA Negative Negative   Microscopic Examination See below:    Urinalysis Reflex Comment   Urine Culture, Routine  Result Value Ref Range   Urine Culture, Routine Final report (A)    Urine Culture result 1 Escherichia coli (A)    ANTIMICROBIAL SUSCEPTIBILITY Comment       Assessment & Plan:   Problem List Items Addressed This Visit      Unprioritized   Essential hypertension    Stable, continue present medications.         Relevant Medications   olmesartan (BENICAR) 40 MG tablet   furosemide (LASIX) 40 MG tablet   amLODipine (NORVASC) 2.5 MG tablet   Hyperlipemia    Stable, continue present medications.        Relevant Medications   olmesartan (BENICAR) 40 MG tablet   furosemide (LASIX) 40 MG tablet   amLODipine (NORVASC) 2.5 MG tablet   Type 2 diabetes mellitus with stage 3 chronic kidney disease, without long-term current use of insulin (HCC)    Hgb A1 C is 6.7. Continue Metformin as tolerated      Relevant Medications   olmesartan (BENICAR) 40 MG tablet   Other Relevant Orders   Bayer DCA Hb A1c Waived   Comprehensive metabolic panel    Other Visit Diagnoses    Need for influenza vaccination    -  Primary   Relevant Orders   Flu vaccine HIGH DOSE PF (Completed)       Follow up plan: Return for physical and also needs 6 months for chronic disease.

## 2015-12-19 NOTE — Patient Instructions (Addendum)

## 2015-12-20 LAB — COMPREHENSIVE METABOLIC PANEL
ALT: 59 IU/L — AB (ref 0–32)
AST: 81 IU/L — ABNORMAL HIGH (ref 0–40)
Albumin/Globulin Ratio: 1.5 (ref 1.2–2.2)
Albumin: 4.5 g/dL (ref 3.6–4.8)
Alkaline Phosphatase: 128 IU/L — ABNORMAL HIGH (ref 39–117)
BUN/Creatinine Ratio: 10 — ABNORMAL LOW (ref 12–28)
BUN: 10 mg/dL (ref 8–27)
Bilirubin Total: 0.5 mg/dL (ref 0.0–1.2)
CALCIUM: 9.7 mg/dL (ref 8.7–10.3)
CO2: 20 mmol/L (ref 18–29)
CREATININE: 1.02 mg/dL — AB (ref 0.57–1.00)
Chloride: 99 mmol/L (ref 96–106)
GFR, EST AFRICAN AMERICAN: 66 mL/min/{1.73_m2} (ref >59)
GFR, EST NON AFRICAN AMERICAN: 57 mL/min/{1.73_m2} — AB (ref >59)
GLUCOSE: 146 mg/dL — AB (ref 65–99)
Globulin, Total: 3 g/dL (ref 1.5–4.5)
Potassium: 4.7 mmol/L (ref 3.5–5.2)
Sodium: 141 mmol/L (ref 134–144)
TOTAL PROTEIN: 7.5 g/dL (ref 6.0–8.5)

## 2016-01-09 DIAGNOSIS — I2510 Atherosclerotic heart disease of native coronary artery without angina pectoris: Secondary | ICD-10-CM | POA: Diagnosis not present

## 2016-01-09 DIAGNOSIS — I340 Nonrheumatic mitral (valve) insufficiency: Secondary | ICD-10-CM | POA: Diagnosis not present

## 2016-01-09 DIAGNOSIS — E782 Mixed hyperlipidemia: Secondary | ICD-10-CM | POA: Diagnosis not present

## 2016-01-09 DIAGNOSIS — I10 Essential (primary) hypertension: Secondary | ICD-10-CM | POA: Diagnosis not present

## 2016-01-15 ENCOUNTER — Inpatient Hospital Stay: Payer: PRIVATE HEALTH INSURANCE | Attending: Internal Medicine

## 2016-01-15 ENCOUNTER — Ambulatory Visit: Payer: PRIVATE HEALTH INSURANCE

## 2016-01-15 ENCOUNTER — Ambulatory Visit: Payer: PRIVATE HEALTH INSURANCE | Admitting: Internal Medicine

## 2016-01-15 DIAGNOSIS — Z7982 Long term (current) use of aspirin: Secondary | ICD-10-CM | POA: Insufficient documentation

## 2016-01-15 DIAGNOSIS — K5790 Diverticulosis of intestine, part unspecified, without perforation or abscess without bleeding: Secondary | ICD-10-CM | POA: Insufficient documentation

## 2016-01-15 DIAGNOSIS — M129 Arthropathy, unspecified: Secondary | ICD-10-CM | POA: Diagnosis not present

## 2016-01-15 DIAGNOSIS — E079 Disorder of thyroid, unspecified: Secondary | ICD-10-CM | POA: Insufficient documentation

## 2016-01-15 DIAGNOSIS — Z7984 Long term (current) use of oral hypoglycemic drugs: Secondary | ICD-10-CM | POA: Diagnosis not present

## 2016-01-15 DIAGNOSIS — M8580 Other specified disorders of bone density and structure, unspecified site: Secondary | ICD-10-CM | POA: Insufficient documentation

## 2016-01-15 DIAGNOSIS — E669 Obesity, unspecified: Secondary | ICD-10-CM | POA: Insufficient documentation

## 2016-01-15 DIAGNOSIS — M5416 Radiculopathy, lumbar region: Secondary | ICD-10-CM | POA: Insufficient documentation

## 2016-01-15 DIAGNOSIS — K5520 Angiodysplasia of colon without hemorrhage: Secondary | ICD-10-CM | POA: Insufficient documentation

## 2016-01-15 DIAGNOSIS — R0602 Shortness of breath: Secondary | ICD-10-CM | POA: Insufficient documentation

## 2016-01-15 DIAGNOSIS — K219 Gastro-esophageal reflux disease without esophagitis: Secondary | ICD-10-CM | POA: Diagnosis not present

## 2016-01-15 DIAGNOSIS — D509 Iron deficiency anemia, unspecified: Secondary | ICD-10-CM | POA: Diagnosis not present

## 2016-01-15 DIAGNOSIS — E119 Type 2 diabetes mellitus without complications: Secondary | ICD-10-CM | POA: Insufficient documentation

## 2016-01-15 DIAGNOSIS — I2510 Atherosclerotic heart disease of native coronary artery without angina pectoris: Secondary | ICD-10-CM | POA: Insufficient documentation

## 2016-01-15 DIAGNOSIS — I509 Heart failure, unspecified: Secondary | ICD-10-CM | POA: Insufficient documentation

## 2016-01-15 DIAGNOSIS — R609 Edema, unspecified: Secondary | ICD-10-CM | POA: Diagnosis not present

## 2016-01-15 DIAGNOSIS — I10 Essential (primary) hypertension: Secondary | ICD-10-CM | POA: Diagnosis not present

## 2016-01-15 DIAGNOSIS — D500 Iron deficiency anemia secondary to blood loss (chronic): Secondary | ICD-10-CM

## 2016-01-15 DIAGNOSIS — Z7901 Long term (current) use of anticoagulants: Secondary | ICD-10-CM | POA: Diagnosis not present

## 2016-01-15 DIAGNOSIS — E7800 Pure hypercholesterolemia, unspecified: Secondary | ICD-10-CM | POA: Insufficient documentation

## 2016-01-15 DIAGNOSIS — E785 Hyperlipidemia, unspecified: Secondary | ICD-10-CM | POA: Insufficient documentation

## 2016-01-15 DIAGNOSIS — Z79899 Other long term (current) drug therapy: Secondary | ICD-10-CM | POA: Diagnosis not present

## 2016-01-15 DIAGNOSIS — M1990 Unspecified osteoarthritis, unspecified site: Secondary | ICD-10-CM | POA: Insufficient documentation

## 2016-01-15 LAB — CBC WITH DIFFERENTIAL/PLATELET
BASOS ABS: 0 10*3/uL (ref 0–0.1)
BASOS PCT: 0 %
EOS PCT: 3 %
Eosinophils Absolute: 0.3 10*3/uL (ref 0–0.7)
HEMATOCRIT: 40.2 % (ref 35.0–47.0)
Hemoglobin: 13.6 g/dL (ref 12.0–16.0)
LYMPHS PCT: 16 %
Lymphs Abs: 1.5 10*3/uL (ref 1.0–3.6)
MCH: 32.8 pg (ref 26.0–34.0)
MCHC: 33.9 g/dL (ref 32.0–36.0)
MCV: 96.5 fL (ref 80.0–100.0)
MONO ABS: 0.9 10*3/uL (ref 0.2–0.9)
MONOS PCT: 10 %
NEUTROS ABS: 6.7 10*3/uL — AB (ref 1.4–6.5)
Neutrophils Relative %: 71 %
PLATELETS: 161 10*3/uL (ref 150–440)
RBC: 4.16 MIL/uL (ref 3.80–5.20)
RDW: 13.2 % (ref 11.5–14.5)
WBC: 9.5 10*3/uL (ref 3.6–11.0)

## 2016-01-15 LAB — IRON AND TIBC
Iron: 99 ug/dL (ref 28–170)
Saturation Ratios: 28 % (ref 10.4–31.8)
TIBC: 352 ug/dL (ref 250–450)
UIBC: 253 ug/dL

## 2016-01-15 LAB — BASIC METABOLIC PANEL
Anion gap: 8 (ref 5–15)
BUN: 8 mg/dL (ref 6–20)
CALCIUM: 9 mg/dL (ref 8.9–10.3)
CO2: 28 mmol/L (ref 22–32)
CREATININE: 0.97 mg/dL (ref 0.44–1.00)
Chloride: 100 mmol/L — ABNORMAL LOW (ref 101–111)
GFR calc Af Amer: >60 mL/min (ref >60)
GFR, EST NON AFRICAN AMERICAN: 59 mL/min — AB (ref >60)
GLUCOSE: 155 mg/dL — AB (ref 65–99)
Potassium: 4.3 mmol/L (ref 3.5–5.1)
Sodium: 136 mmol/L (ref 135–145)

## 2016-01-15 LAB — FERRITIN: Ferritin: 185 ng/mL (ref 11–307)

## 2016-01-17 ENCOUNTER — Inpatient Hospital Stay (HOSPITAL_BASED_OUTPATIENT_CLINIC_OR_DEPARTMENT_OTHER): Payer: PRIVATE HEALTH INSURANCE | Admitting: Internal Medicine

## 2016-01-17 ENCOUNTER — Inpatient Hospital Stay: Payer: PRIVATE HEALTH INSURANCE

## 2016-01-17 VITALS — BP 147/83 | HR 73 | Temp 96.6°F | Resp 18 | Wt 205.6 lb

## 2016-01-17 DIAGNOSIS — D509 Iron deficiency anemia, unspecified: Secondary | ICD-10-CM | POA: Diagnosis not present

## 2016-01-17 DIAGNOSIS — I10 Essential (primary) hypertension: Secondary | ICD-10-CM

## 2016-01-17 DIAGNOSIS — E079 Disorder of thyroid, unspecified: Secondary | ICD-10-CM

## 2016-01-17 DIAGNOSIS — Z7901 Long term (current) use of anticoagulants: Secondary | ICD-10-CM

## 2016-01-17 DIAGNOSIS — R609 Edema, unspecified: Secondary | ICD-10-CM | POA: Diagnosis not present

## 2016-01-17 DIAGNOSIS — M129 Arthropathy, unspecified: Secondary | ICD-10-CM

## 2016-01-17 DIAGNOSIS — D500 Iron deficiency anemia secondary to blood loss (chronic): Secondary | ICD-10-CM

## 2016-01-17 DIAGNOSIS — M5416 Radiculopathy, lumbar region: Secondary | ICD-10-CM

## 2016-01-17 DIAGNOSIS — I509 Heart failure, unspecified: Secondary | ICD-10-CM | POA: Diagnosis not present

## 2016-01-17 DIAGNOSIS — M8580 Other specified disorders of bone density and structure, unspecified site: Secondary | ICD-10-CM

## 2016-01-17 DIAGNOSIS — E785 Hyperlipidemia, unspecified: Secondary | ICD-10-CM

## 2016-01-17 DIAGNOSIS — Z79899 Other long term (current) drug therapy: Secondary | ICD-10-CM

## 2016-01-17 DIAGNOSIS — Z7984 Long term (current) use of oral hypoglycemic drugs: Secondary | ICD-10-CM

## 2016-01-17 DIAGNOSIS — E119 Type 2 diabetes mellitus without complications: Secondary | ICD-10-CM

## 2016-01-17 DIAGNOSIS — M1990 Unspecified osteoarthritis, unspecified site: Secondary | ICD-10-CM

## 2016-01-17 DIAGNOSIS — I2510 Atherosclerotic heart disease of native coronary artery without angina pectoris: Secondary | ICD-10-CM | POA: Diagnosis not present

## 2016-01-17 DIAGNOSIS — K5520 Angiodysplasia of colon without hemorrhage: Secondary | ICD-10-CM | POA: Diagnosis not present

## 2016-01-17 DIAGNOSIS — E669 Obesity, unspecified: Secondary | ICD-10-CM

## 2016-01-17 DIAGNOSIS — E7800 Pure hypercholesterolemia, unspecified: Secondary | ICD-10-CM

## 2016-01-17 DIAGNOSIS — K5790 Diverticulosis of intestine, part unspecified, without perforation or abscess without bleeding: Secondary | ICD-10-CM

## 2016-01-17 DIAGNOSIS — R0602 Shortness of breath: Secondary | ICD-10-CM

## 2016-01-17 DIAGNOSIS — K219 Gastro-esophageal reflux disease without esophagitis: Secondary | ICD-10-CM

## 2016-01-17 DIAGNOSIS — Z7982 Long term (current) use of aspirin: Secondary | ICD-10-CM

## 2016-01-17 MED ORDER — SODIUM CHLORIDE 0.9 % IV SOLN
510.0000 mg | Freq: Once | INTRAVENOUS | Status: DC
  Filled 2016-01-17: qty 17

## 2016-01-17 MED ORDER — SODIUM CHLORIDE 0.9 % IV SOLN
Freq: Once | INTRAVENOUS | Status: DC
  Filled 2016-01-17: qty 1000

## 2016-01-17 NOTE — Progress Notes (Signed)
[facility] NOTE  Patient Care Team: Kathrine Haddock, NP as PCP - General (Nurse Practitioner) Alisa Graff, FNP as Nurse Practitioner (Family Medicine) Dionisio David, MD as Consulting Physician (Cardiology) Cammie Sickle, MD as Consulting Physician (Internal Medicine)  CHIEF COMPLAINTS/PURPOSE OF CONSULTATION:   # 2014- Iron def Anemia-  [Sep 2014 EGD/COLO- NEG/ferritin 6; Dr.Wohl]. 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   # Hx CHF/CAD- asprin-plavix  HISTORY OF PRESENTING ILLNESS:  Erica Larsen 65 y.o.  female patient with above history of iron deficiency anemia of unclear etiology is here status post IV iron in November 2016 for follow-up.   Patient underwent capsule study that showed angiodysplasias are not bleeding.   Patient's energy levels stable, not interfering with daily activities. She c/o dyspnea on exertion but not at rest.. She continues to deny blood in stools or black stools, except from occasional hemorrhoids. Continues to have chronic swelling,which she attributes to her history of congestive heart failure, currently controlled with lasix 40mg  daily. She has not had any significant weight gain the last few months.  ROS: A complete 10 point review of system is done which is negative except mentioned above in history of present illness  MEDICAL HISTORY:  Past Medical History:  Diagnosis Date  . Arthritis    osteoarthritis  . CHF (congestive heart failure) ([place])   . Coronary artery disease   . Diabetes mellitus without complication ([place])   . Diverticulosis   . Esophagitis   . GERD (gastroesophageal reflux disease)   . High cholesterol   . Hypercalcemia   . Hypertension   . Lumbar radiculopathy   . Obesity   . Osteopenia    Hips  . Thyroid disease     SURGICAL HISTORY: Past Surgical History:  Procedure  Laterality Date  . COLONOSCOPY WITH PROPOFOL N/A 08/01/2015   Procedure: COLONOSCOPY WITH PROPOFOL;  Surgeon: Lucilla Lame, MD;  Location: [facility] ENDOSCOPY;  Service: Endoscopy;  Laterality: N/A;  . CORONARY ANGIOPLASTY WITH STENT PLACEMENT    . GIVENS CAPSULE STUDY N/A 06/01/2015   Procedure: GIVENS CAPSULE STUDY;  Surgeon: Lucilla Lame, MD;  Location: [facility] ENDOSCOPY;  Service: Endoscopy;  Laterality: N/A;    SOCIAL HISTORY: Social History   Social History  . Marital status: Married    Spouse name: N/A  . Number of children: N/A  . Years of education: N/A   Occupational History  . Not on file.   Social History Main Topics  . Smoking status: Never Smoker  . Smokeless tobacco: Never Used  . Alcohol use No  . Drug use: No  . Sexual activity: Yes    Birth control/ protection: Post-menopausal   Other Topics Concern  . Not on file   Social History Narrative  . No narrative on file    FAMILY HISTORY: Family History  Problem Relation Age of Onset  . Heart disease Mother   . Kidney failure Mother   . Hypertension Mother   . Diabetes Mother   . Heart attack Father   . Hypertension Father   . Thyroid disease Father   . Hypertension Sister   . Diabetes Sister   . Thyroid disease Daughter   . Thyroid disease Daughter     ALLERGIES:  is allergic to sulfa antibiotics and latex.  MEDICATIONS:  Current Outpatient Prescriptions  Medication Sig Dispense Refill  . amLODipine (NORVASC) 2.5 MG tablet Take 1 tablet (  2.5 mg total) by mouth daily. 30 tablet 5  . aspirin 81 MG tablet Take 81 mg by mouth daily.    [name] atorvastatin (LIPITOR) 80 MG tablet Take 80 mg by mouth daily.  3  . clopidogrel (PLAVIX) 75 MG tablet Take 75 mg by mouth every morning.     . furosemide (LASIX) 40 MG tablet Take 1 tablet (40 mg total) by mouth daily. 30 tablet 5  . lansoprazole (PREVACID) 30 MG capsule Take 1 capsule (30 mg total) by mouth daily at 12 noon. 90 capsule 1  . levothyroxine (SYNTHROID,  LEVOTHROID) 100 MCG tablet Take 1 tablet (100 mcg total) by mouth daily. 90 tablet 3  . metFORMIN (GLUCOPHAGE) 500 MG tablet Take 1 tablet (500 mg total) by mouth 2 (two) times daily with a meal. 60 tablet 3  . metoprolol (LOPRESSOR) 50 MG tablet Take 50 mg by mouth daily.    [name] olmesartan (BENICAR) 40 MG tablet Take 1 tablet (40 mg total) by mouth daily. 30 tablet 5  . Potassium Chloride ER 20 MEQ TBCR Take 20 mEq by mouth daily. 30 tablet 5  . raloxifene (EVISTA) 60 MG tablet Take 1 tablet (60 mg total) by mouth daily. 30 tablet 5   No current facility-administered medications for this visit.       [name]  PHYSICAL EXAMINATION: ECOG PERFORMANCE STATUS: 0 - Asymptomatic  BP (!) 147/83 (BP Location: Left Arm, Patient Position: Sitting)   Pulse 73   Temp (!) 96.6 F (35.9 C) (Tympanic)   Resp 18   Wt 205 lb 9.6 oz (93.3 kg)   BMI 37.36 kg/m   GENERAL: Well-nourished well-developed; Alert, no distress and comfortable. Obese. Puffy face. EYES: no pallor/ no icterus.  OROPHARYNX: no thrush or ulceration; poor dentition. LUNGS: clear to auscultation and No wheeze or crackles HEART/CVS: regular rate & rhythm and no murmurs; 1+ bilateral lower extremity edema. ABDOMEN: abdomen soft, non-tender and normal bowel sounds Musculoskeletal:no cyanosis of digits and no clubbing  PSYCH: alert & oriented x 3 with fluent speech NEURO: no focal motor/sensory deficits SKIN: no rashes or significant lesions   LABORATORY DATA:  I have reviewed the data as listed Lab Results  Component Value Date   WBC 9.5 01/15/2016   HGB 13.6 01/15/2016   HCT 40.2 01/15/2016   MCV 96.5 01/15/2016   PLT 161 01/15/2016    Recent Labs  03/08/15 1103 09/18/15 1054 12/19/15 1101 01/15/16 1002  NA 141 145* 141 136  K 4.9 4.9 4.7 4.3  CL 98 101 99 100*  CO2 26 26 20 28   GLUCOSE 121* 117* 146* 155*  BUN 16 12 10 8   CREATININE 0.95 1.11* 1.02* 0.97  CALCIUM 9.5 9.5 9.7 9.0  GFRNONAA 63 52* 57* 59*   GFRAA 72 59* 66 >60  PROT 7.3 6.9 7.5  --   ALBUMIN 4.6 4.3 4.5  --   AST 29 99* 81*  --   ALT 27 88* 59*  --   ALKPHOS 133* 117 128*  --   BILITOT 0.4 0.6 0.5  --      ASSESSMENT & PLAN:  No problem-specific Assessment & Plan notes found for this encounter. Eriel was seen today for iron deficiency.  Diagnoses and all orders for this visit:  Iron deficiency anemia due to chronic blood loss -     CBC with Differential/Platelet; Future -     Iron and TIBC; Future -     Ferritin; Future   Follow up  with MD in 6 months; labs 1wk prior to visit; possible ferreheme infusion.      Lucendia Herrlich, NP 01/17/2016 2:15 PM

## 2016-01-17 NOTE — Assessment & Plan Note (Signed)
#   Iron deficiency anemia- unclear etiology; question GI blood loss. 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 Terminal ileal abnormality- awaiting repeat colonoscopy.  # Today hemoglobin is 13 ferritin is 200; I would not recommend any IV iron today. Recommend follow-up in 6 months; he'll have labs done few days prior; IV Feraheme if needed.  # 15 minutes face-to-face with the patient discussing the above plan of care; more than 50% of time spent ocounseling and coordination.

## 2016-04-02 DIAGNOSIS — L720 Epidermal cyst: Secondary | ICD-10-CM | POA: Diagnosis not present

## 2016-04-02 DIAGNOSIS — D485 Neoplasm of uncertain behavior of skin: Secondary | ICD-10-CM | POA: Diagnosis not present

## 2016-04-02 DIAGNOSIS — L281 Prurigo nodularis: Secondary | ICD-10-CM | POA: Diagnosis not present

## 2016-04-15 LAB — HM DIABETES EYE EXAM

## 2016-04-16 DIAGNOSIS — L570 Actinic keratosis: Secondary | ICD-10-CM | POA: Diagnosis not present

## 2016-05-07 ENCOUNTER — Telehealth: Payer: Self-pay | Admitting: Gastroenterology

## 2016-05-07 NOTE — Telephone Encounter (Signed)
Patient having difficulties getting food down. Feels like it is stuck halfway down. Please call.

## 2016-05-08 ENCOUNTER — Other Ambulatory Visit: Payer: Self-pay

## 2016-05-08 DIAGNOSIS — R1319 Other dysphagia: Secondary | ICD-10-CM

## 2016-05-08 DIAGNOSIS — R1310 Dysphagia, unspecified: Secondary | ICD-10-CM

## 2016-05-08 NOTE — Telephone Encounter (Signed)
Pt scheduled for an EGD at [facility] on 05/14/16 with Wohl.  Please precert for Dysphagia Y38.88

## 2016-05-08 NOTE — Telephone Encounter (Signed)
LVM for pt to return my call.

## 2016-05-09 DIAGNOSIS — E875 Hyperkalemia: Secondary | ICD-10-CM | POA: Diagnosis not present

## 2016-05-09 DIAGNOSIS — I10 Essential (primary) hypertension: Secondary | ICD-10-CM | POA: Diagnosis not present

## 2016-05-09 DIAGNOSIS — I340 Nonrheumatic mitral (valve) insufficiency: Secondary | ICD-10-CM | POA: Diagnosis not present

## 2016-05-09 DIAGNOSIS — E782 Mixed hyperlipidemia: Secondary | ICD-10-CM | POA: Diagnosis not present

## 2016-05-09 DIAGNOSIS — I2510 Atherosclerotic heart disease of native coronary artery without angina pectoris: Secondary | ICD-10-CM | POA: Diagnosis not present

## 2016-05-09 DIAGNOSIS — I509 Heart failure, unspecified: Secondary | ICD-10-CM | POA: Diagnosis not present

## 2016-05-10 DIAGNOSIS — H02839 Dermatochalasis of unspecified eye, unspecified eyelid: Secondary | ICD-10-CM | POA: Diagnosis not present

## 2016-05-10 DIAGNOSIS — E119 Type 2 diabetes mellitus without complications: Secondary | ICD-10-CM | POA: Diagnosis not present

## 2016-05-10 DIAGNOSIS — H18413 Arcus senilis, bilateral: Secondary | ICD-10-CM | POA: Diagnosis not present

## 2016-05-10 DIAGNOSIS — H2513 Age-related nuclear cataract, bilateral: Secondary | ICD-10-CM | POA: Diagnosis not present

## 2016-05-13 ENCOUNTER — Encounter: Payer: Medicare Other | Admitting: Unknown Physician Specialty

## 2016-05-14 ENCOUNTER — Ambulatory Visit
Admission: RE | Admit: 2016-05-14 | Discharge: 2016-05-14 | Disposition: A | Discharge status: Home / Self Care | Payer: PRIVATE HEALTH INSURANCE | Source: Ambulatory Visit | Attending: Gastroenterology | Admitting: Gastroenterology

## 2016-05-14 ENCOUNTER — Encounter
Admission: RE | Disposition: A | Discharge status: Home / Self Care | Payer: Self-pay | Source: Ambulatory Visit | Attending: Gastroenterology

## 2016-05-14 ENCOUNTER — Ambulatory Visit: Payer: PRIVATE HEALTH INSURANCE | Admitting: Anesthesiology

## 2016-05-14 ENCOUNTER — Encounter: Payer: Self-pay | Admitting: *Deleted

## 2016-05-14 DIAGNOSIS — I110 Hypertensive heart disease with heart failure: Secondary | ICD-10-CM | POA: Diagnosis not present

## 2016-05-14 DIAGNOSIS — M8580 Other specified disorders of bone density and structure, unspecified site: Secondary | ICD-10-CM | POA: Diagnosis not present

## 2016-05-14 DIAGNOSIS — E7800 Pure hypercholesterolemia, unspecified: Secondary | ICD-10-CM | POA: Diagnosis not present

## 2016-05-14 DIAGNOSIS — E079 Disorder of thyroid, unspecified: Secondary | ICD-10-CM | POA: Insufficient documentation

## 2016-05-14 DIAGNOSIS — I2510 Atherosclerotic heart disease of native coronary artery without angina pectoris: Secondary | ICD-10-CM | POA: Insufficient documentation

## 2016-05-14 DIAGNOSIS — Z79899 Other long term (current) drug therapy: Secondary | ICD-10-CM | POA: Diagnosis not present

## 2016-05-14 DIAGNOSIS — Z79810 Long term (current) use of selective estrogen receptor modulators (SERMs): Secondary | ICD-10-CM | POA: Diagnosis not present

## 2016-05-14 DIAGNOSIS — E669 Obesity, unspecified: Secondary | ICD-10-CM | POA: Diagnosis not present

## 2016-05-14 DIAGNOSIS — K219 Gastro-esophageal reflux disease without esophagitis: Secondary | ICD-10-CM | POA: Diagnosis not present

## 2016-05-14 DIAGNOSIS — Z6838 Body mass index (BMI) 38.0-38.9, adult: Secondary | ICD-10-CM | POA: Insufficient documentation

## 2016-05-14 DIAGNOSIS — K449 Diaphragmatic hernia without obstruction or gangrene: Secondary | ICD-10-CM | POA: Diagnosis not present

## 2016-05-14 DIAGNOSIS — I509 Heart failure, unspecified: Secondary | ICD-10-CM | POA: Insufficient documentation

## 2016-05-14 DIAGNOSIS — Z955 Presence of coronary angioplasty implant and graft: Secondary | ICD-10-CM | POA: Insufficient documentation

## 2016-05-14 DIAGNOSIS — R1310 Dysphagia, unspecified: Secondary | ICD-10-CM | POA: Diagnosis not present

## 2016-05-14 DIAGNOSIS — Z7902 Long term (current) use of antithrombotics/antiplatelets: Secondary | ICD-10-CM | POA: Diagnosis not present

## 2016-05-14 DIAGNOSIS — Z7982 Long term (current) use of aspirin: Secondary | ICD-10-CM | POA: Insufficient documentation

## 2016-05-14 DIAGNOSIS — M1990 Unspecified osteoarthritis, unspecified site: Secondary | ICD-10-CM | POA: Insufficient documentation

## 2016-05-14 DIAGNOSIS — K2970 Gastritis, unspecified, without bleeding: Secondary | ICD-10-CM

## 2016-05-14 DIAGNOSIS — E119 Type 2 diabetes mellitus without complications: Secondary | ICD-10-CM | POA: Insufficient documentation

## 2016-05-14 DIAGNOSIS — R1319 Other dysphagia: Secondary | ICD-10-CM

## 2016-05-14 DIAGNOSIS — Z7984 Long term (current) use of oral hypoglycemic drugs: Secondary | ICD-10-CM | POA: Diagnosis not present

## 2016-05-14 HISTORY — PX: ESOPHAGOGASTRODUODENOSCOPY (EGD) WITH PROPOFOL: SHX5813

## 2016-05-14 LAB — GLUCOSE, CAPILLARY: GLUCOSE-CAPILLARY: 143 mg/dL — AB (ref 65–99)

## 2016-05-14 SURGERY — ESOPHAGOGASTRODUODENOSCOPY (EGD) WITH PROPOFOL
Anesthesia: General

## 2016-05-14 MED ORDER — PROPOFOL 10 MG/ML IV BOLUS
INTRAVENOUS | Status: DC | PRN
  Administered 2016-05-14: 20 mg via INTRAVENOUS
  Administered 2016-05-14 (×2): 30 mg via INTRAVENOUS
  Administered 2016-05-14: 80 mg via INTRAVENOUS

## 2016-05-14 MED ORDER — PROPOFOL 10 MG/ML IV BOLUS
INTRAVENOUS | Status: AC
  Filled 2016-05-14: qty 20

## 2016-05-14 MED ORDER — PROPOFOL 500 MG/50ML IV EMUL
INTRAVENOUS | Status: AC
  Filled 2016-05-14: qty 50

## 2016-05-14 MED ORDER — SODIUM CHLORIDE 0.9 % IV SOLN
INTRAVENOUS | Status: DC
Start: 2016-05-14 — End: 2016-05-16
  Administered 2016-05-14: 09:00:00 via INTRAVENOUS

## 2016-05-14 NOTE — Anesthesia Postprocedure Evaluation (Signed)
Anesthesia Post Note  Patient: Erica Larsen  Procedure(s) Performed: Procedure(s) (LRB): ESOPHAGOGASTRODUODENOSCOPY (EGD) WITH PROPOFOL (N/A)  Patient location during evaluation: Endoscopy Anesthesia Type: General Level of consciousness: awake and alert Pain management: pain level controlled Vital Signs Assessment: post-procedure vital signs reviewed and stable Respiratory status: spontaneous breathing, nonlabored ventilation, respiratory function stable and patient connected to nasal cannula oxygen Cardiovascular status: blood pressure returned to baseline and stable Postop Assessment: no signs of nausea or vomiting Anesthetic complications: no     Last Vitals:  Vitals:   05/14/16 0950 05/14/16 1000  BP: (!) 147/89 (!) 143/77  Pulse: 65 64  Resp: 13 13  Temp:      Last Pain:  Vitals:   05/14/16 0844  TempSrc: Tympanic                 Martha Clan

## 2016-05-14 NOTE — Transfer of Care (Signed)
Immediate Anesthesia Transfer of Care Note  Patient: Erica Larsen  Procedure(s) Performed: Procedure(s): ESOPHAGOGASTRODUODENOSCOPY (EGD) WITH PROPOFOL (N/A)  Patient Location: PACU and Endoscopy Unit  Anesthesia Type:General  Level of Consciousness: awake, alert  and oriented  Airway & Oxygen Therapy: Patient Spontanous Breathing and Patient connected to nasal cannula oxygen  Post-op Assessment: Report given to RN and Post -op Vital signs reviewed and stable  Post vital signs: Reviewed and stable  Last Vitals:  Vitals:   05/14/16 0844 05/14/16 0930  BP: (!) 154/78 121/61  Pulse: 74 66  Resp: 18 18  Temp: (!) 35.9 C 36.1 C    Last Pain:  Vitals:   05/14/16 0844  TempSrc: Tympanic         Complications: No apparent anesthesia complications

## 2016-05-14 NOTE — H&P (Signed)
Lucilla Lame, MD [facility] [address] [place], [place] [address] Phone:[phone] Fax : [phone]  Primary Care Physician:  Kathrine Haddock, NP Primary Gastroenterologist:  Dr. Allen Norris  Pre-Procedure History & Physical: HPI:  Erica Larsen is a 66 y.o. female is here for an endoscopy.   Past Medical History:  Diagnosis Date  . Arthritis    osteoarthritis  . CHF (congestive heart failure) ([place])   . Coronary artery disease   . Diabetes mellitus without complication ([place])   . Diverticulosis   . Esophagitis   . GERD (gastroesophageal reflux disease)   . High cholesterol   . Hypercalcemia   . Hypertension   . Lumbar radiculopathy   . Obesity   . Osteopenia    Hips  . Thyroid disease     Past Surgical History:  Procedure Laterality Date  . COLONOSCOPY WITH PROPOFOL N/A 08/01/2015   Procedure: COLONOSCOPY WITH PROPOFOL;  Surgeon: Lucilla Lame, MD;  Location: [facility] ENDOSCOPY;  Service: Endoscopy;  Laterality: N/A;  . CORONARY ANGIOPLASTY WITH STENT PLACEMENT    . GIVENS CAPSULE STUDY N/A 06/01/2015   Procedure: GIVENS CAPSULE STUDY;  Surgeon: Lucilla Lame, MD;  Location: [facility] ENDOSCOPY;  Service: Endoscopy;  Laterality: N/A;    Prior to Admission medications   Medication Sig Start Date End Date Taking? Authorizing Provider  amLODipine (NORVASC) 2.5 MG tablet Take 1 tablet (2.5 mg total) by mouth daily. 12/19/15  Yes Kathrine Haddock, NP  aspirin 81 MG tablet Take 81 mg by mouth daily.   Yes Historical Provider, MD  atorvastatin (LIPITOR) 80 MG tablet Take 80 mg by mouth daily. 08/30/15  Yes Historical Provider, MD  clopidogrel (PLAVIX) 75 MG tablet Take 75 mg by mouth every morning.    Yes Historical Provider, MD  furosemide (LASIX) 40 MG tablet Take 1 tablet (40 mg total) by mouth daily. 12/19/15  Yes Kathrine Haddock, NP  lansoprazole (PREVACID) 30 MG capsule Take 1 capsule (30 mg total) by mouth daily at 12 noon. 09/18/15  Yes Kathrine Haddock, NP  levothyroxine (SYNTHROID,  LEVOTHROID) 100 MCG tablet Take 1 tablet (100 mcg total) by mouth daily. 06/20/15  Yes Kathrine Haddock, NP  metFORMIN (GLUCOPHAGE) 500 MG tablet Take 1 tablet (500 mg total) by mouth 2 (two) times daily with a meal. 09/18/15  Yes Kathrine Haddock, NP  metoprolol (LOPRESSOR) 50 MG tablet Take 50 mg by mouth daily.   Yes Historical Provider, MD  olmesartan (BENICAR) 40 MG tablet Take 1 tablet (40 mg total) by mouth daily. 12/19/15  Yes Kathrine Haddock, NP  Potassium Chloride ER 20 MEQ TBCR Take 20 mEq by mouth daily. 12/14/14  Yes Alisa Graff, FNP  raloxifene (EVISTA) 60 MG tablet Take 1 tablet (60 mg total) by mouth daily. 12/19/15  Yes Kathrine Haddock, NP    Allergies as of 05/08/2016 - Review Complete 01/17/2016  Allergen Reaction Noted  . Sulfa antibiotics Swelling 08/07/2014  . Latex Rash 08/07/2014    Family History  Problem Relation Age of Onset  . Heart disease Mother   . Kidney failure Mother   . Hypertension Mother   . Diabetes Mother   . Heart attack Father   . Hypertension Father   . Thyroid disease Father   . Hypertension Sister   . Diabetes Sister   . Thyroid disease Daughter   . Thyroid disease Daughter     Social History   Social History  . Marital status: Married    Spouse name: N/A  . Number  of children: N/A  . Years of education: N/A   Occupational History  . Not on file.   Social History Main Topics  . Smoking status: Never Smoker  . Smokeless tobacco: Never Used  . Alcohol use No  . Drug use: No  . Sexual activity: Yes    Birth control/ protection: Post-menopausal   Other Topics Concern  . Not on file   Social History Narrative  . No narrative on file    Review of Systems: See HPI, otherwise negative ROS  Physical Exam: BP (!) 154/78   Pulse 74   Temp (!) 96.6 F (35.9 C) (Tympanic)   Resp 18   Ht 5\' 2"  (1.575 m)   Wt 210 lb (95.3 kg)   SpO2 100%   BMI 38.41 kg/m  General:   Alert,  pleasant and cooperative in NAD Head:  Normocephalic  and atraumatic. Neck:  Supple; no masses or thyromegaly. Lungs:  Clear throughout to auscultation.    Heart:  Regular rate and rhythm. Abdomen:  Soft, nontender and nondistended. Normal bowel sounds, without guarding, and without rebound.   Neurologic:  Alert and  oriented x4;  grossly normal neurologically.  Impression/Plan: Erica Larsen is here for an endoscopy to be performed for dysphagia  Risks, benefits, limitations, and alternatives regarding  endoscopy have been reviewed with the patient.  Questions have been answered.  All parties agreeable.   Lucilla Lame, MD  05/14/2016, 9:05 AM

## 2016-05-14 NOTE — Anesthesia Preprocedure Evaluation (Signed)
Anesthesia Evaluation  Patient identified by MRN, date of birth, ID band Patient awake    Reviewed: Allergy & Precautions, H&P , NPO status , Patient's Chart, lab work & pertinent test results, reviewed documented beta blocker date and time   History of Anesthesia Complications Negative for: history of anesthetic complications  Airway Mallampati: III  TM Distance: >3 FB Neck ROM: full    Dental  (+) Caps, Missing   Pulmonary neg pulmonary ROS,           Cardiovascular Exercise Tolerance: Good hypertension, (-) angina+ CAD, + Cardiac Stents and +CHF  (-) Past MI and (-) CABG (-) dysrhythmias + Valvular Problems/Murmurs      Neuro/Psych negative neurological ROS  negative psych ROS   GI/Hepatic Neg liver ROS, GERD  ,  Endo/Other  diabetes, Type 2, Oral Hypoglycemic AgentsMorbid obesity  Renal/GU CRFRenal disease  negative genitourinary   Musculoskeletal   Abdominal   Peds  Hematology  (+) Blood dyscrasia, anemia ,   Anesthesia Other Findings Past Medical History: No date: Arthritis     Comment: osteoarthritis No date: CHF (congestive heart failure) ([facility]) No date: Coronary artery disease No date: Diabetes mellitus without complication ([facility]) No date: Diverticulosis No date: Esophagitis No date: GERD (gastroesophageal reflux disease) No date: High cholesterol No date: Hypercalcemia No date: Hypertension No date: Lumbar radiculopathy No date: Obesity No date: Osteopenia     Comment: Hips No date: Thyroid disease   Reproductive/Obstetrics negative OB ROS                             Anesthesia Physical Anesthesia Plan  ASA: III  Anesthesia Plan: General   Post-op Pain Management:    Induction:   Airway Management Planned:   Additional Equipment:   Intra-op Plan:   Post-operative Plan:   Informed Consent: I have reviewed the patients History and Physical, chart, labs and  discussed the procedure including the risks, benefits and alternatives for the proposed anesthesia with the patient or authorized representative who has indicated his/her understanding and acceptance.   Dental Advisory Given  Plan Discussed with: Anesthesiologist, CRNA and Surgeon  Anesthesia Plan Comments:         Anesthesia Quick Evaluation

## 2016-05-14 NOTE — Anesthesia Post-op Follow-up Note (Cosign Needed)
Anesthesia QCDR form completed.        

## 2016-05-14 NOTE — Op Note (Signed)
[facility] Patient Name: Erica Larsen Procedure Date: 05/14/2016 9:13 AM MRN: [id number] Account #: [id number] Date of Birth: 03-Jan-1951 Admit Type: Outpatient Age: 66 Room: [facility] ENDO ROOM 4 Gender: Female Note Status: Finalized Procedure:            Upper GI endoscopy Indications:          Dysphagia Providers:            Lucilla Lame MD, MD Referring MD:         Kathrine Haddock (Referring MD) Medicines:            Propofol per Anesthesia Complications:        No immediate complications. Procedure:            Pre-Anesthesia Assessment:                       - Prior to the procedure, a History and Physical was                        performed, and patient medications and allergies were                        reviewed. The patient's tolerance of previous                        anesthesia was also reviewed. The risks and benefits of                        the procedure and the sedation options and risks were                        discussed with the patient. All questions were                        answered, and informed consent was obtained. Prior                        Anticoagulants: The patient has taken no previous                        anticoagulant or antiplatelet agents. ASA Grade                        Assessment: II - A patient with mild systemic disease.                        After reviewing the risks and benefits, the patient was                        deemed in satisfactory condition to undergo the                        procedure.                       After obtaining informed consent, the endoscope was                        passed under direct vision. Throughout the procedure,  the patient's blood pressure, pulse, and oxygen                        saturations were monitored continuously. The Endoscope                        was introduced through the mouth, and advanced to the                        second part  of duodenum. The upper GI endoscopy was                        accomplished without difficulty. The patient tolerated                        the procedure well. Findings:      A large hiatal hernia was present.      Localized minimal inflammation characterized by erythema was found in       the gastric antrum.      The examined duodenum was normal. Impression:           - Large hiatal hernia.                       - Gastritis.                       - Normal examined duodenum.                       - No specimens collected. Recommendation:       - Discharge patient to home.                       - Resume previous diet.                       - Continue present medications.                       - Do an upper GI series. Procedure Code(s):    --- Professional ---                       [phone], Esophagogastroduodenoscopy, flexible, transoral;                        diagnostic, including collection of specimen(s) by                        brushing or washing, when performed (separate procedure) Diagnosis Code(s):    --- Professional ---                       R13.10, Dysphagia, unspecified                       K29.70, Gastritis, unspecified, without bleeding CPT copyright 2016 [employer]. All rights reserved. The codes documented in this report are preliminary and upon coder review may  be revised to meet current compliance requirements. Lucilla Lame MD, MD 05/14/2016 9:29:20 AM This report has been signed electronically. Number of Addenda: 0 Note Initiated On: 05/14/2016 9:13 AM      [facility]

## 2016-05-15 DIAGNOSIS — E875 Hyperkalemia: Secondary | ICD-10-CM | POA: Diagnosis not present

## 2016-05-15 DIAGNOSIS — I2510 Atherosclerotic heart disease of native coronary artery without angina pectoris: Secondary | ICD-10-CM | POA: Diagnosis not present

## 2016-05-15 DIAGNOSIS — E782 Mixed hyperlipidemia: Secondary | ICD-10-CM | POA: Diagnosis not present

## 2016-05-16 ENCOUNTER — Encounter: Payer: Self-pay | Admitting: Gastroenterology

## 2016-05-27 ENCOUNTER — Other Ambulatory Visit: Payer: Self-pay

## 2016-05-27 DIAGNOSIS — R1310 Dysphagia, unspecified: Secondary | ICD-10-CM

## 2016-05-31 ENCOUNTER — Ambulatory Visit (INDEPENDENT_AMBULATORY_CARE_PROVIDER_SITE_OTHER): Payer: PRIVATE HEALTH INSURANCE | Admitting: Unknown Physician Specialty

## 2016-05-31 ENCOUNTER — Encounter: Payer: Self-pay | Admitting: Unknown Physician Specialty

## 2016-05-31 VITALS — BP 129/79 | HR 60 | Temp 98.3°F | Ht 62.3 in | Wt 207.4 lb

## 2016-05-31 DIAGNOSIS — N183 Chronic kidney disease, stage 3 unspecified: Secondary | ICD-10-CM

## 2016-05-31 DIAGNOSIS — I10 Essential (primary) hypertension: Secondary | ICD-10-CM | POA: Diagnosis not present

## 2016-05-31 DIAGNOSIS — E1122 Type 2 diabetes mellitus with diabetic chronic kidney disease: Secondary | ICD-10-CM | POA: Diagnosis not present

## 2016-05-31 DIAGNOSIS — E7800 Pure hypercholesterolemia, unspecified: Secondary | ICD-10-CM

## 2016-05-31 DIAGNOSIS — I5032 Chronic diastolic (congestive) heart failure: Secondary | ICD-10-CM

## 2016-05-31 DIAGNOSIS — R748 Abnormal levels of other serum enzymes: Secondary | ICD-10-CM

## 2016-05-31 DIAGNOSIS — I2510 Atherosclerotic heart disease of native coronary artery without angina pectoris: Secondary | ICD-10-CM

## 2016-05-31 DIAGNOSIS — D500 Iron deficiency anemia secondary to blood loss (chronic): Secondary | ICD-10-CM | POA: Diagnosis not present

## 2016-05-31 DIAGNOSIS — Z0000 Encounter for general adult medical examination without abnormal findings: Secondary | ICD-10-CM

## 2016-05-31 DIAGNOSIS — Z7189 Other specified counseling: Secondary | ICD-10-CM | POA: Insufficient documentation

## 2016-05-31 LAB — MICROALBUMIN, URINE WAIVED
Creatinine, Urine Waived: 10 mg/dL (ref 10–300)
Microalb, Ur Waived: 10 mg/L (ref 0–19)

## 2016-05-31 LAB — BAYER DCA HB A1C WAIVED: HB A1C (BAYER DCA - WAIVED): 6.7 % (ref <7.0)

## 2016-05-31 MED ORDER — SITAGLIPTIN PHOSPHATE 100 MG PO TABS: 100.0000 mg | ORAL_TABLET | Freq: Every day | ORAL | 3 refills | Status: DC

## 2016-05-31 NOTE — Assessment & Plan Note (Signed)
Check CMP today 

## 2016-05-31 NOTE — Patient Instructions (Signed)
Please do call to schedule your mammogram; the number to schedule one at either [facility] or [facility] is [phone]   

## 2016-05-31 NOTE — Assessment & Plan Note (Signed)
Reviewed notes from Dr. Humphrey Rolls.  No change in treatment

## 2016-05-31 NOTE — Progress Notes (Signed)
BP 129/79 (BP Location: Left Arm, Patient Position: Sitting, Cuff Size: Large)   Pulse 60   Temp 98.3 F (36.8 C)   Ht 5' 2.3" (1.582 m)   Wt 207 lb 6.4 oz (94.1 kg)   SpO2 96%   BMI 37.57 kg/m    Subjective:    Patient ID: Erica Larsen, female    DOB: 1950/08/27, 66 y.o.   MRN: [id number]  HPI: Erica Larsen is a 66 y.o. female  Chief Complaint  Patient presents with  . Medicare Wellness  . Diabetes    pt states she had eye exam with Dr. Gloriann Loan, will fax form to them   . Hyperlipidemia  . Hypertension   Functional Status Survey: Is the patient deaf or have difficulty hearing?: Yes (slight) Does the patient have difficulty seeing, even when wearing glasses/contacts?: Yes (pt states she has cataract surgery scheduled) Does the patient have difficulty concentrating, remembering, or making decisions?: No Does the patient have difficulty walking or climbing stairs?: No Does the patient have difficulty dressing or bathing?: No Does the patient have difficulty doing errands alone such as visiting a doctor's office or shopping?: Yes (pt states she does not drive right now because of vision)  Fall Risk  05/31/2016 05/10/2015 12/14/2014 08/17/2014  Falls in the past year? No No No No   Depression screen [facility] 2/9 05/31/2016 05/10/2015 12/14/2014 08/17/2014  Decreased Interest 0 0 0 0  Down, Depressed, Hopeless 0 0 0 0  PHQ - 2 Score 0 0 0 0  Altered sleeping 0 - - -  Tired, decreased energy 3 - - -  Change in appetite 0 - - -  Feeling bad or failure about yourself  0 - - -  Trouble concentrating 0 - - -  Moving slowly or fidgety/restless 0 - - -  Suicidal thoughts 0 - - -  PHQ-9 Score 3 - - -   Diabetes: "I need another pill" as Metformin causes diarrhea and forgets to take it at night."   No hypoglycemic episodes No hyperglycemic episodes Feet problems: none Blood Sugars averaging: eye exam within last year Last Hgb A1C: 6.7  Hypertension  Using medications without  difficulty Average home BPs "a little high"   Using medication without problems or lightheadedness No chest pain with exertion or shortness of breath Leg swelling  CHF Stopped going to heart failure clinc but seeing Dr. Humphrey Rolls  Elevated Cholesterol Using medications without problems No Muscle aches  Diet/Not good: Not good   Dysphagia Seeing Dr. Allen Norris.  Endoscopic procedure not helpful.  She is planning on getting more testing.    Mini Cog is 3  Relevant past medical, surgical, family and social history reviewed and updated as indicated. Interim medical history since our last visit reviewed. Allergies and medications reviewed and updated.  Review of Systems  Constitutional: Negative.   HENT: Negative.   Respiratory: Negative.   Cardiovascular: Positive for leg swelling.  Neurological: Negative.   Psychiatric/Behavioral: Negative.     Per HPI unless specifically indicated above     Objective:    BP 129/79 (BP Location: Left Arm, Patient Position: Sitting, Cuff Size: Large)   Pulse 60   Temp 98.3 F (36.8 C)   Ht 5' 2.3" (1.582 m)   Wt 207 lb 6.4 oz (94.1 kg)   SpO2 96%   BMI 37.57 kg/m   Wt Readings from Last 3 Encounters:  05/31/16 207 lb 6.4 oz (94.1 kg)  05/14/16 210 lb (95.3  kg)  01/17/16 205 lb 9.6 oz (93.3 kg)    Physical Exam  Constitutional: She is oriented to person, place, and time. She appears well-developed and well-nourished.  HENT:  Head: Normocephalic and atraumatic.  Eyes: Pupils are equal, round, and reactive to light. Right eye exhibits no discharge. Left eye exhibits no discharge. No scleral icterus.  Neck: Normal range of motion. Neck supple. Carotid bruit is not present. No thyromegaly present.  Cardiovascular: Normal rate, regular rhythm and normal heart sounds.  Exam reveals no gallop and no friction rub.   No murmur heard. Pulmonary/Chest: Effort normal and breath sounds normal. No respiratory distress. She has no wheezes. She has no  rales.  Abdominal: Soft. Bowel sounds are normal. There is no tenderness. There is no rebound.  Genitourinary: No breast swelling, tenderness or discharge.  Musculoskeletal: Normal range of motion.  Lymphadenopathy:    She has no cervical adenopathy.  Neurological: She is alert and oriented to person, place, and time.  Skin: Skin is warm, dry and intact. No rash noted.  Psychiatric: She has a normal mood and affect. Her speech is normal and behavior is normal. Judgment and thought content normal. Cognition and memory are normal.    Results for orders placed or performed during the hospital encounter of 05/14/16  Glucose, capillary  Result Value Ref Range   Glucose-Capillary 143 (H) 65 - 99 mg/dL      Assessment & Plan:   Problem List Items Addressed This Visit      Unprioritized   Advanced care planning/counseling discussion    Extensive discussion.  Pt not interested in Advance Care planning at this time.  She is comfortable with her husband making care decisions for her.  Packet given and she will "think about it."      CAD (coronary artery disease) - Primary   Relevant Medications   atorvastatin (LIPITOR) 20 MG tablet   Other Relevant Orders   Lipid Panel w/o Chol/HDL Ratio   Chronic diastolic heart failure [facility])    Reviewed notes from Dr. Humphrey Rolls.  No change in treatment      Relevant Medications   atorvastatin (LIPITOR) 20 MG tablet   Other Relevant Orders   Comprehensive metabolic panel   Chronic kidney disease, stage 3   Elevated liver enzymes    Check CMP today      Essential hypertension    Stable, continue present medications.        Relevant Medications   atorvastatin (LIPITOR) 20 MG tablet   Other Relevant Orders   Comprehensive metabolic panel   Hyperlipemia    Await lipid panel      Relevant Medications   atorvastatin (LIPITOR) 20 MG tablet   Iron deficiency anemia due to chronic blood loss    Per Hematology.  Notes reviewed.  Following her CBC       Type 2 diabetes mellitus with stage 3 chronic kidney disease, without long-term current use of insulin (HCC)    Hgb A1C is 6.7.  This is to goal but not tolerating Metformin.  Change to Januvia.  Recheck in 3 months      Relevant Medications   atorvastatin (LIPITOR) 20 MG tablet   sitaGLIPtin (JANUVIA) 100 MG tablet   Other Relevant Orders   Bayer DCA Hb A1c Waived   Microalbumin, Urine Waived    Other Visit Diagnoses    Annual physical exam           Follow up plan: Return in about 3  months (around 08/30/2016).

## 2016-05-31 NOTE — Assessment & Plan Note (Signed)
Hgb A1C is 6.7.  This is to goal but not tolerating Metformin.  Change to Januvia.  Recheck in 3 months

## 2016-05-31 NOTE — Assessment & Plan Note (Signed)
Await lipid panel 

## 2016-05-31 NOTE — Assessment & Plan Note (Signed)
Stable, continue present medications.   

## 2016-05-31 NOTE — Assessment & Plan Note (Signed)
Extensive discussion.  Pt not interested in Advance Care planning at this time.  She is comfortable with her husband making care decisions for her.  Packet given and she will "think about it."

## 2016-05-31 NOTE — Assessment & Plan Note (Signed)
Per Hematology.  Notes reviewed.  Following her CBC

## 2016-06-01 LAB — COMPREHENSIVE METABOLIC PANEL
ALK PHOS: 83 IU/L (ref 39–117)
ALT: 44 IU/L — AB (ref 0–32)
AST: 45 IU/L — ABNORMAL HIGH (ref 0–40)
Albumin/Globulin Ratio: 1.7 (ref 1.2–2.2)
Albumin: 4.6 g/dL (ref 3.6–4.8)
BUN/Creatinine Ratio: 14 (ref 12–28)
BUN: 17 mg/dL (ref 8–27)
Bilirubin Total: 0.5 mg/dL (ref 0.0–1.2)
CALCIUM: 9.7 mg/dL (ref 8.7–10.3)
CO2: 27 mmol/L (ref 18–29)
CREATININE: 1.2 mg/dL — AB (ref 0.57–1.00)
Chloride: 96 mmol/L (ref 96–106)
GFR calc Af Amer: 54 mL/min/{1.73_m2} — ABNORMAL LOW (ref >59)
GFR, EST NON AFRICAN AMERICAN: 47 mL/min/{1.73_m2} — AB (ref >59)
Globulin, Total: 2.7 g/dL (ref 1.5–4.5)
Glucose: 147 mg/dL — ABNORMAL HIGH (ref 65–99)
Potassium: 4.4 mmol/L (ref 3.5–5.2)
Sodium: 139 mmol/L (ref 134–144)
Total Protein: 7.3 g/dL (ref 6.0–8.5)

## 2016-06-01 LAB — LIPID PANEL W/O CHOL/HDL RATIO
CHOLESTEROL TOTAL: 165 mg/dL (ref 100–199)
HDL: 75 mg/dL (ref >39)
LDL CALC: 72 mg/dL (ref 0–99)
TRIGLYCERIDES: 88 mg/dL (ref 0–149)
VLDL Cholesterol Cal: 18 mg/dL (ref 5–40)

## 2016-06-03 ENCOUNTER — Encounter: Payer: Self-pay | Admitting: Unknown Physician Specialty

## 2016-06-05 ENCOUNTER — Other Ambulatory Visit: Payer: Self-pay

## 2016-06-11 ENCOUNTER — Other Ambulatory Visit: Payer: Self-pay

## 2016-06-11 ENCOUNTER — Encounter: Payer: Self-pay | Admitting: Gastroenterology

## 2016-06-11 NOTE — Telephone Encounter (Signed)
error 

## 2016-06-18 ENCOUNTER — Telehealth: Payer: Self-pay | Admitting: Gastroenterology

## 2016-06-18 ENCOUNTER — Ambulatory Visit: Admission: RE | Admit: 2016-06-18 | Payer: PRIVATE HEALTH INSURANCE | Source: Ambulatory Visit

## 2016-06-18 NOTE — Telephone Encounter (Signed)
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 Please call patient.

## 2016-06-19 NOTE — Telephone Encounter (Signed)
LVM for pt to return my call.

## 2016-06-25 NOTE — Telephone Encounter (Signed)
Left vm again for pt to return my call.  

## 2016-06-26 ENCOUNTER — Other Ambulatory Visit: Payer: Self-pay | Admitting: Unknown Physician Specialty

## 2016-06-27 ENCOUNTER — Other Ambulatory Visit: Payer: Self-pay

## 2016-06-27 DIAGNOSIS — K449 Diaphragmatic hernia without obstruction or gangrene: Secondary | ICD-10-CM

## 2016-06-27 NOTE — Telephone Encounter (Signed)
Spoke with pt and she is ready for her referral to general surgery for a consultation for a hiatal hernia repair. Referral sent to [facility].

## 2016-07-01 DIAGNOSIS — H2512 Age-related nuclear cataract, left eye: Secondary | ICD-10-CM | POA: Diagnosis not present

## 2016-07-03 ENCOUNTER — Other Ambulatory Visit: Payer: Self-pay | Admitting: Unknown Physician Specialty

## 2016-07-10 ENCOUNTER — Inpatient Hospital Stay: Payer: PRIVATE HEALTH INSURANCE | Attending: Internal Medicine

## 2016-07-10 DIAGNOSIS — D500 Iron deficiency anemia secondary to blood loss (chronic): Secondary | ICD-10-CM | POA: Diagnosis not present

## 2016-07-10 LAB — CBC WITH DIFFERENTIAL/PLATELET
Basophils Absolute: 0 10*3/uL (ref 0–0.1)
Basophils Relative: 0 %
Eosinophils Absolute: 0.3 10*3/uL (ref 0–0.7)
Eosinophils Relative: 5 %
HEMATOCRIT: 38.5 % (ref 35.0–47.0)
Hemoglobin: 13.5 g/dL (ref 12.0–16.0)
LYMPHS ABS: 2.1 10*3/uL (ref 1.0–3.6)
LYMPHS PCT: 36 %
MCH: 33.2 pg (ref 26.0–34.0)
MCHC: 35 g/dL (ref 32.0–36.0)
MCV: 94.7 fL (ref 80.0–100.0)
MONO ABS: 0.5 10*3/uL (ref 0.2–0.9)
MONOS PCT: 9 %
Neutro Abs: 2.9 10*3/uL (ref 1.4–6.5)
Neutrophils Relative %: 50 %
Platelets: 168 10*3/uL (ref 150–440)
RBC: 4.07 MIL/uL (ref 3.80–5.20)
RDW: 12.8 % (ref 11.5–14.5)
WBC: 5.7 10*3/uL (ref 3.6–11.0)

## 2016-07-10 LAB — IRON AND TIBC
Iron: 91 ug/dL (ref 28–170)
Saturation Ratios: 22 % (ref 10.4–31.8)
TIBC: 422 ug/dL (ref 250–450)
UIBC: 331 ug/dL

## 2016-07-11 ENCOUNTER — Other Ambulatory Visit: Payer: Self-pay | Admitting: Unknown Physician Specialty

## 2016-07-15 ENCOUNTER — Telehealth: Payer: Self-pay | Admitting: Internal Medicine

## 2016-07-15 NOTE — Telephone Encounter (Signed)
Please inform patient that her iron studies/ labs are within normal limits; I would not recommend any IV iron at this time. Please check if patient- if it's okay to cancel the appointment for May 30th; and if it is okay- please reschedule appointment with me in 6 months-possible ferriheme/labs-CBC BMP and studies ferritin few days prior.

## 2016-07-16 ENCOUNTER — Other Ambulatory Visit: Payer: Self-pay | Admitting: *Deleted

## 2016-07-16 DIAGNOSIS — D649 Anemia, unspecified: Secondary | ICD-10-CM

## 2016-07-16 NOTE — Telephone Encounter (Signed)
Left vm notifying pt of results and dr b's recommendations.  Ordered labs for 6 mths.

## 2016-07-16 NOTE — Telephone Encounter (Signed)
Left vm to return our call

## 2016-07-17 ENCOUNTER — Inpatient Hospital Stay: Payer: PRIVATE HEALTH INSURANCE | Admitting: Internal Medicine

## 2016-07-17 ENCOUNTER — Other Ambulatory Visit: Payer: PRIVATE HEALTH INSURANCE

## 2016-07-17 ENCOUNTER — Inpatient Hospital Stay: Payer: PRIVATE HEALTH INSURANCE

## 2016-07-22 DIAGNOSIS — H2511 Age-related nuclear cataract, right eye: Secondary | ICD-10-CM | POA: Diagnosis not present

## 2016-07-26 ENCOUNTER — Ambulatory Visit (INDEPENDENT_AMBULATORY_CARE_PROVIDER_SITE_OTHER): Payer: PRIVATE HEALTH INSURANCE | Admitting: Surgery

## 2016-07-26 ENCOUNTER — Encounter: Payer: Self-pay | Admitting: Surgery

## 2016-07-26 VITALS — BP 143/83 | HR 66 | Temp 97.4°F | Ht 62.0 in | Wt 208.2 lb

## 2016-07-26 DIAGNOSIS — R079 Chest pain, unspecified: Secondary | ICD-10-CM | POA: Diagnosis not present

## 2016-07-26 DIAGNOSIS — I2510 Atherosclerotic heart disease of native coronary artery without angina pectoris: Secondary | ICD-10-CM | POA: Diagnosis not present

## 2016-07-26 DIAGNOSIS — K449 Diaphragmatic hernia without obstruction or gangrene: Secondary | ICD-10-CM | POA: Diagnosis not present

## 2016-07-26 NOTE — Patient Instructions (Addendum)
We have schedule your CT scans and Barrium Swallow study as listed below.  You will need to [facility] contrast [facility] mall.  Nothing to eat or drink 3 hours prior to having the [employer] study.  We will follow up with you in office as listed below as well.  We will send over medical clearance to Dr. Laurelyn Sickle office.

## 2016-07-26 NOTE — Progress Notes (Signed)
Surgical Consultation  07/26/2016  Erica Larsen is an 66 y.o. female.   Chief Complaint  Patient presents with  . New Patient (Initial Visit)    Hiatal hernia referred by [name]     HPI: 66 year old female seen in consultation at the request of Dr. Allen Norris. She has been having some dysphagia for solid for the last year or so and some reflux as well. No evidence of food impaction. She is able to drink liquids without any issues. And he she does feel that when she eats solid the food gets stuck down her chest. Currently she changed her diet to almost full liquid diet. She was evaluated by Dr.Wohl and EGD per was performed showing evidence of a large hiatal hernia. I have personally reviewed the images. Also add chest x-ray to have personally reviewed there is evidence of a hiatal hernia as well. She does have a history of CHF and does have a stent. She is on Plavix  Past Medical History:  Diagnosis Date  . Arthritis    osteoarthritis  . CHF (congestive heart failure) ([place])   . Coronary artery disease   . Diabetes mellitus without complication ([place])   . Diverticulosis   . Esophagitis   . GERD (gastroesophageal reflux disease)   . High cholesterol   . Hypercalcemia   . Hypertension   . Lumbar radiculopathy   . Obesity   . Osteopenia    Hips  . Thyroid disease     Past Surgical History:  Procedure Laterality Date  . COLONOSCOPY WITH PROPOFOL N/A 08/01/2015   Procedure: COLONOSCOPY WITH PROPOFOL;  Surgeon: Lucilla Lame, MD;  Location: [facility] ENDOSCOPY;  Service: Endoscopy;  Laterality: N/A;  . CORONARY ANGIOPLASTY WITH STENT PLACEMENT    . ESOPHAGOGASTRODUODENOSCOPY (EGD) WITH PROPOFOL N/A 05/14/2016   Procedure: ESOPHAGOGASTRODUODENOSCOPY (EGD) WITH PROPOFOL;  Surgeon: Lucilla Lame, MD;  Location: [facility] ENDOSCOPY;  Service: Endoscopy;  Laterality: N/A;  . GIVENS CAPSULE STUDY N/A 06/01/2015   Procedure: GIVENS CAPSULE STUDY;  Surgeon: Lucilla Lame, MD;  Location: [facility] ENDOSCOPY;   Service: Endoscopy;  Laterality: N/A;  . pre cancer removed     left forearm    Family History  Problem Relation Age of Onset  . Heart disease Mother   . Kidney failure Mother   . Hypertension Mother   . Diabetes Mother   . Heart attack Father   . Hypertension Father   . Thyroid disease Father   . Hypertension Sister   . Diabetes Sister   . Thyroid disease Daughter   . Thyroid disease Daughter     Social History:  reports that she has never smoked. She has never used smokeless tobacco. She reports that she does not drink alcohol or use drugs.  Allergies:  Allergies  Allergen Reactions  . Sulfa Antibiotics Anaphylaxis and Swelling  . Latex Rash    Medications reviewed.   ROS Full ROS performed and is otherwise negative other than what is stated in the HPI   BP (!) 143/83   Pulse 66   Temp 97.4 F (36.3 C) (Oral)   Ht 5\' 2"  (1.575 m)   Wt 94.4 kg (208 lb 3.2 oz)   BMI 38.08 kg/m   Physical Exam  Constitutional: She is oriented to person, place, and time and well-developed, well-nourished, and in no distress. No distress.  HENT:  Head: Normocephalic.  Eyes: Right eye exhibits no discharge. Left eye exhibits no discharge. No scleral icterus.  Neck: Normal range of motion. No  JVD present. No tracheal deviation present.  Cardiovascular: Normal rate, regular rhythm and normal heart sounds.   Pulmonary/Chest: Effort normal. No stridor. No respiratory distress. She has no wheezes. She has no rales.  Abdominal: Soft. She exhibits no distension and no mass. There is no tenderness. There is no rebound and no guarding.  Musculoskeletal: Normal range of motion. She exhibits no edema.  Lymphadenopathy:    She has no cervical adenopathy.  Neurological: She is alert and oriented to person, place, and time. Gait normal. GCS score is 15.  Skin: Skin is warm and dry. She is not diaphoretic.  Psychiatric: Mood, memory, affect and judgment normal.  Nursing note and vitals  reviewed.  Assessment/Plan: 1. Hiatal hernia - CT Abdomen Pelvis W Contrast; Future - DG Esophagus; Future  symptomatic  HH with significant dysphasia. I will order a CT scan of the chest and abdomen to delineate the anatomy for surgical planning. I will also obtain an impairment swallow to assess the function of the esophagus and ask cardiology for preoperative optimization. We will need to stop her Plavix 1 week before the surgery. I have also encouraged her to lose some weight. I will see her back after workup is completed in 2-3 weeks. 2. Chest pain, unspecified type - CT Chest W Contrast; Future   Caroleen Hamman, MD [place] Surgeon

## 2016-08-01 ENCOUNTER — Ambulatory Visit
Admission: RE | Admit: 2016-08-01 | Discharge: 2016-08-01 | Disposition: A | Discharge status: Home / Self Care | Payer: PRIVATE HEALTH INSURANCE | Source: Ambulatory Visit | Attending: Surgery | Admitting: Surgery

## 2016-08-01 DIAGNOSIS — K449 Diaphragmatic hernia without obstruction or gangrene: Secondary | ICD-10-CM

## 2016-08-01 DIAGNOSIS — K219 Gastro-esophageal reflux disease without esophagitis: Secondary | ICD-10-CM | POA: Insufficient documentation

## 2016-08-09 ENCOUNTER — Ambulatory Visit
Admission: RE | Admit: 2016-08-09 | Discharge: 2016-08-09 | Disposition: A | Discharge status: Home / Self Care | Payer: PRIVATE HEALTH INSURANCE | Source: Ambulatory Visit | Attending: Surgery | Admitting: Surgery

## 2016-08-09 DIAGNOSIS — K760 Fatty (change of) liver, not elsewhere classified: Secondary | ICD-10-CM | POA: Insufficient documentation

## 2016-08-09 DIAGNOSIS — R079 Chest pain, unspecified: Secondary | ICD-10-CM | POA: Diagnosis not present

## 2016-08-09 DIAGNOSIS — K449 Diaphragmatic hernia without obstruction or gangrene: Secondary | ICD-10-CM | POA: Insufficient documentation

## 2016-08-09 DIAGNOSIS — I2510 Atherosclerotic heart disease of native coronary artery without angina pectoris: Secondary | ICD-10-CM | POA: Insufficient documentation

## 2016-08-09 LAB — POCT I-STAT CREATININE: CREATININE: 1.4 mg/dL — AB (ref 0.44–1.00)

## 2016-08-09 MED ORDER — IOPAMIDOL (ISOVUE-370) INJECTION 76%
75.0000 mL | Freq: Once | INTRAVENOUS | Status: AC | PRN
  Administered 2016-08-09: 75 mL via INTRAVENOUS

## 2016-08-12 ENCOUNTER — Other Ambulatory Visit: Payer: Self-pay | Admitting: Family Medicine

## 2016-08-12 NOTE — Telephone Encounter (Signed)
Routing to provider. Appt on 09/11/16.

## 2016-08-28 ENCOUNTER — Ambulatory Visit (INDEPENDENT_AMBULATORY_CARE_PROVIDER_SITE_OTHER): Payer: PRIVATE HEALTH INSURANCE | Admitting: Surgery

## 2016-08-28 ENCOUNTER — Encounter: Payer: Self-pay | Admitting: Surgery

## 2016-08-28 VITALS — BP 143/73 | HR 76 | Temp 97.8°F | Ht 62.0 in | Wt 207.0 lb

## 2016-08-28 DIAGNOSIS — I2510 Atherosclerotic heart disease of native coronary artery without angina pectoris: Secondary | ICD-10-CM

## 2016-08-28 DIAGNOSIS — K449 Diaphragmatic hernia without obstruction or gangrene: Secondary | ICD-10-CM

## 2016-08-28 NOTE — Progress Notes (Signed)
Surgical Consultation  08/28/2016  Erica Larsen is an 66 y.o. female.   Chief Complaint  Patient presents with  . Follow-up    hiatal hernia     HPI: Erica Larsen is f/u For hiatal hernia and further workup. I have personally reviewed both the upper GI series and a CT scan of the chest and abdomen and pelvis. There is evidence of a large hiatal hernia with half the stomach in her chest. There is also evidence of good contrast going to the stomach and the duodenum. There is no evidence of any dysmotility disorder or esophageal lesions. Significant reflux. Patient continues to have significant reflux and some dysphagia for solids. She has been trying to lose some weight but is been difficult since she had a recent eye or seizure. Now she is more motivated to do some exercise Results discussed with the patient in detail  Past Medical History:  Diagnosis Date  . Arthritis    osteoarthritis  . CHF (congestive heart failure) ([place])   . Coronary artery disease   . Diabetes mellitus without complication ([place])   . Diverticulosis   . Esophagitis   . GERD (gastroesophageal reflux disease)   . Hiatal hernia   . High cholesterol   . Hypercalcemia   . Hypertension   . Lumbar radiculopathy   . Obesity   . Osteopenia    Hips  . Thyroid disease     Past Surgical History:  Procedure Laterality Date  . COLONOSCOPY WITH PROPOFOL N/A 08/01/2015   Procedure: COLONOSCOPY WITH PROPOFOL;  Surgeon: Lucilla Lame, MD;  Location: [facility] ENDOSCOPY;  Service: Endoscopy;  Laterality: N/A;  . CORONARY ANGIOPLASTY WITH STENT PLACEMENT    . ESOPHAGOGASTRODUODENOSCOPY (EGD) WITH PROPOFOL N/A 05/14/2016   Procedure: ESOPHAGOGASTRODUODENOSCOPY (EGD) WITH PROPOFOL;  Surgeon: Lucilla Lame, MD;  Location: [facility] ENDOSCOPY;  Service: Endoscopy;  Laterality: N/A;  . GIVENS CAPSULE STUDY N/A 06/01/2015   Procedure: GIVENS CAPSULE STUDY;  Surgeon: Lucilla Lame, MD;  Location: [facility] ENDOSCOPY;  Service: Endoscopy;  Laterality: N/A;   . pre cancer removed     left forearm    Family History  Problem Relation Age of Onset  . Heart disease Mother   . Kidney failure Mother   . Hypertension Mother   . Diabetes Mother   . Heart attack Father   . Hypertension Father   . Thyroid disease Father   . Hypertension Sister   . Diabetes Sister   . Thyroid disease Daughter   . Thyroid disease Daughter     Social History:  reports that she has never smoked. She has never used smokeless tobacco. She reports that she does not drink alcohol or use drugs.  Allergies:  Allergies  Allergen Reactions  . Sulfa Antibiotics Anaphylaxis and Swelling  . Latex Rash    Medications reviewed.     ROS Full ROS performed and is otherwise negative other than what is stated in the HPI    BP (!) 143/73   Pulse 76   Temp 97.8 F (36.6 C) (Oral)   Ht 5\' 2"  (1.575 m)   Wt 93.9 kg (207 lb)   BMI 37.86 kg/m    Physical Exam  Constitutional: She is oriented to person, place, and time and well-developed, well-nourished, and in no distress. No distress.  Neck: Normal range of motion. No JVD present. No tracheal deviation present.  Cardiovascular: Normal rate and regular rhythm.   No murmur heard. Pulmonary/Chest: Effort normal and breath sounds normal. No respiratory distress. She  has no wheezes. She has no rales.  Abdominal: Soft. She exhibits no distension and no mass. There is no tenderness. There is no rebound and no guarding.  Musculoskeletal: Normal range of motion. She exhibits no tenderness or deformity.  Neurological: She is alert and oriented to person, place, and time. Gait normal. GCS score is 15.  Skin: Skin is warm and dry. She is not diaphoretic.  Psychiatric: Mood, memory, affect and judgment normal.  Nursing note and vitals reviewed.    Assessment/Plan: Symptomatic hiatal hernia ( type III) in need for surgical intervention. Discussed with the patient in detail upper options. Laparoscopic versus open repair  with fundoplication. I do think that we can attempt a laparoscopic repair with a partial fundoplication and placement of a bioprosthetic. There is a good chance that we'll have to convert to open given the size and patient body habitus. Encouraged again to lose some more weight and exercise. We will obtain a final coronary clearance as she needs to be off Plavix for 1 week at least. Procedure discussed with the patient in detail, risk, benefits and possible complications including but not limited to: Bleeding, infection, esophageal injury, pneumothorax, chronic pain, dysphagia, recurrence. She understands and wishes to proceed. We'll plan for elective hiatal hernia repair with fundoplication in a few weeks as OR schedule allows. I spent 40 minutes in this encounter with greater than 50% of the time spent in coordination and counseling of her care.  Caroleen Hamman, MD [facility] Surgeon

## 2016-08-28 NOTE — Patient Instructions (Signed)
Please look at your [facility] Sheet in case you have any questions or concerns.  Please go to your appointment to see Dr. Humphrey Rolls so you could let him know that we are needing cardiac clearance.  Please remember to stop taking your Plavix 7 days before your surgery date (09/24/2016).  Hiatal Hernia A hiatal hernia occurs when part of the stomach slides above the muscle that separates the abdomen from the chest (diaphragm). A person can be born with a hiatal hernia (congenital), or it may develop over time. In almost all cases of hiatal hernia, only the top part of the stomach pushes through the diaphragm. Many people have a hiatal hernia with no symptoms. The larger the hernia, the more likely it is that you will have symptoms. In some cases, a hiatal hernia allows stomach acid to flow back into the tube that carries food from your mouth to your stomach (esophagus). This may cause heartburn symptoms. Severe heartburn symptoms may mean that you have developed a condition called gastroesophageal reflux disease (GERD). What are the causes? This condition is caused by a weakness in the opening (hiatus) where the esophagus passes through the diaphragm to attach to the upper part of the stomach. A person may be born with a weakness in the hiatus, or a weakness can develop over time. What increases the risk? This condition is more likely to develop in:  Older people. Age is a major risk factor for a hiatal hernia, especially if you are over the age of 62.  Pregnant women.  People who are overweight.  People who have frequent constipation.  What are the signs or symptoms? Symptoms of this condition usually develop in the form of GERD symptoms. Symptoms include:  Heartburn.  Belching.  Indigestion.  Trouble swallowing.  Coughing or wheezing.  Sore throat.  Hoarseness.  Chest pain.  Nausea and vomiting.  How is this diagnosed? This condition may be diagnosed during testing for GERD. Tests  that may be done include:  X-rays of your stomach or chest.  An upper gastrointestinal (GI) series. This is an X-ray exam of your GI tract that is taken after you swallow a chalky liquid that shows up clearly on the X-ray.  Endoscopy. This is a procedure to look into your stomach using a thin, flexible tube that has a tiny camera and light on the end of it.  How is this treated? This condition may be treated by:  Dietary and lifestyle changes to help reduce GERD symptoms.  Medicines. These may include: ? Over-the-counter antacids. ? Medicines that make your stomach empty more quickly. ? Medicines that block the production of stomach acid (H2 blockers). ? Stronger medicines to reduce stomach acid (proton pump inhibitors).  Surgery to repair the hernia, if other treatments are not helping.  If you have no symptoms, you may not need treatment. Follow these instructions at home: Lifestyle and activity  Do not use any products that contain nicotine or tobacco, such as cigarettes and e-cigarettes. If you need help quitting, ask your health care provider.  Try to achieve and maintain a healthy body weight.  Avoid putting pressure on your abdomen. Anything that puts pressure on your abdomen increases the amount of acid that may be pushed up into your esophagus. ? Avoid bending over, especially after eating. ? Raise the head of your bed by putting blocks under the legs. This keeps your head and esophagus higher than your stomach. ? Do not wear tight clothing around your chest  or stomach. ? Try not to strain when having a bowel movement, when urinating, or when lifting heavy objects. Eating and drinking  Avoid foods that can worsen GERD symptoms. These may include: ? Fatty foods, like fried foods. ? Citrus fruits, like oranges or lemon. ? Other foods and drinks that contain acid, like orange juice or tomatoes. ? Spicy food. ? Chocolate.  Eat frequent small meals instead of three  large meals a day. This helps prevent your stomach from getting too full. ? Eat slowly. ? Do not lie down right after eating. ? Do not eat 1-2 hours before bed.  Do not drink beverages with caffeine. These include cola, coffee, cocoa, and tea.  Do not drink alcohol. General instructions  Take over-the-counter and prescription medicines only as told by your health care provider.  Keep all follow-up visits as told by your health care provider. This is important. Contact a health care provider if:  Your symptoms are not controlled with medicines or lifestyle changes.  You are having trouble swallowing.  You have coughing or wheezing that will not go away. Get help right away if:  Your pain is getting worse.  Your pain spreads to your arms, neck, jaw, teeth, or back.  You have shortness of breath.  You sweat for no reason.  You feel sick to your stomach (nauseous) or you vomit.  You vomit blood.  You have bright red blood in your stools.  You have black, tarry stools. This information is not intended to replace advice given to you by your health care provider. Make sure you discuss any questions you have with your health care provider. Document Released: 04/27/2003 Document Revised: 01/29/2016 Document Reviewed: 01/29/2016 Elsevier Interactive Patient Education  [employer].

## 2016-08-29 ENCOUNTER — Telehealth: Payer: Self-pay | Admitting: Surgery

## 2016-08-29 ENCOUNTER — Telehealth: Payer: Self-pay

## 2016-08-29 NOTE — Telephone Encounter (Signed)
Called Pt to advise of pre op date/time and sx date. No answer. I have left a message on voicemail.   Sx: 09/24/16 with Dr Pabon--Laparoscopic hiatal hernia repair with fundoplication with Bio-A mesh. Pre op: 09/16/16 @ 9:00am office.   Patient made aware to call [phone], between 1-3:00pm the day before surgery, to find out what time to arrive.

## 2016-08-29 NOTE — Telephone Encounter (Signed)
Patient's Cardiac and Anti-Coagulant Clearance was faxed to Dr. Laurelyn Sickle office. Awaiting for their response.  Patient was told to hold her Plavix 7 days prior to her surgery.

## 2016-08-30 NOTE — Telephone Encounter (Signed)
I have called patient. All surgery information has been given and patient understands.

## 2016-09-09 NOTE — Telephone Encounter (Signed)
Cardiac and Anti-coagulant Clearance obtained from [place] at this time. Patient to stop Plavix 7 days prior to surgery.

## 2016-09-10 DIAGNOSIS — R079 Chest pain, unspecified: Secondary | ICD-10-CM | POA: Diagnosis not present

## 2016-09-10 DIAGNOSIS — I509 Heart failure, unspecified: Secondary | ICD-10-CM | POA: Diagnosis not present

## 2016-09-10 DIAGNOSIS — E782 Mixed hyperlipidemia: Secondary | ICD-10-CM | POA: Diagnosis not present

## 2016-09-10 DIAGNOSIS — I2510 Atherosclerotic heart disease of native coronary artery without angina pectoris: Secondary | ICD-10-CM | POA: Diagnosis not present

## 2016-09-10 DIAGNOSIS — R0602 Shortness of breath: Secondary | ICD-10-CM | POA: Diagnosis not present

## 2016-09-10 DIAGNOSIS — I340 Nonrheumatic mitral (valve) insufficiency: Secondary | ICD-10-CM | POA: Diagnosis not present

## 2016-09-10 DIAGNOSIS — I10 Essential (primary) hypertension: Secondary | ICD-10-CM | POA: Diagnosis not present

## 2016-09-11 ENCOUNTER — Other Ambulatory Visit: Payer: Self-pay | Admitting: Unknown Physician Specialty

## 2016-09-11 ENCOUNTER — Encounter: Payer: Self-pay | Admitting: Unknown Physician Specialty

## 2016-09-11 ENCOUNTER — Ambulatory Visit (INDEPENDENT_AMBULATORY_CARE_PROVIDER_SITE_OTHER): Payer: PRIVATE HEALTH INSURANCE | Admitting: Unknown Physician Specialty

## 2016-09-11 VITALS — BP 137/74 | HR 66 | Temp 98.6°F | Wt 210.2 lb

## 2016-09-11 DIAGNOSIS — I10 Essential (primary) hypertension: Secondary | ICD-10-CM | POA: Diagnosis not present

## 2016-09-11 DIAGNOSIS — E1122 Type 2 diabetes mellitus with diabetic chronic kidney disease: Secondary | ICD-10-CM | POA: Diagnosis not present

## 2016-09-11 DIAGNOSIS — I2510 Atherosclerotic heart disease of native coronary artery without angina pectoris: Secondary | ICD-10-CM

## 2016-09-11 DIAGNOSIS — N183 Chronic kidney disease, stage 3 unspecified: Secondary | ICD-10-CM

## 2016-09-11 DIAGNOSIS — E7800 Pure hypercholesterolemia, unspecified: Secondary | ICD-10-CM

## 2016-09-11 NOTE — Progress Notes (Signed)
BP 137/74   Pulse 66   Temp 98.6 F (37 C)   Wt 210 lb 3.2 oz (95.3 kg)   SpO2 97%   BMI 38.45 kg/m    Subjective:    Patient ID: Erica Larsen, female    DOB: 08-13-1950, 66 y.o.   MRN: [id number]  HPI: Erica Larsen is a 66 y.o. female  Chief Complaint  Patient presents with  . Diabetes    [facility] faxing over eye exam report on patient   . Hyperlipidemia  . Hypertension   Diabetes: Changed from Metformin to Januvia last visit due to tolerance issues.  Using current medications without difficulties No hypoglycemic episodes No hyperglycemic episodes Feet problems:none Blood Sugars averaging: 120-125 in the AM eye exam within last year Last Hgb A1C:6.7 while on Metformin  Hypertension  Using medications without difficulty Average home BPs   Using medication without problems or lightheadedness No chest pain with exertion or shortness of breath No Edema  Elevated Cholesterol Using medications without problems No Muscle aches  Diet: Not eating well with hiatal hernia.  Planning on surgery Exercise: Not doing well due to stomach issues    Relevant past medical, surgical, family and social history reviewed and updated as indicated. Interim medical history since our last visit reviewed. Allergies and medications reviewed and updated.  Review of Systems  Per HPI unless specifically indicated above     Objective:    BP 137/74   Pulse 66   Temp 98.6 F (37 C)   Wt 210 lb 3.2 oz (95.3 kg)   SpO2 97%   BMI 38.45 kg/m   Wt Readings from Last 3 Encounters:  09/11/16 210 lb 3.2 oz (95.3 kg)  08/28/16 207 lb (93.9 kg)  07/26/16 208 lb 3.2 oz (94.4 kg)    Physical Exam  Constitutional: She is oriented to person, place, and time. She appears well-developed and well-nourished. No distress.  HENT:  Head: Normocephalic and atraumatic.  Eyes: Conjunctivae and lids are normal. Right eye exhibits no discharge. Left eye exhibits no discharge. No scleral  icterus.  Neck: Normal range of motion. Neck supple. No JVD present. Carotid bruit is not present.  Cardiovascular: Normal rate, regular rhythm and normal heart sounds.   Pulmonary/Chest: Effort normal and breath sounds normal.  Abdominal: Normal appearance. There is no splenomegaly or hepatomegaly.  Musculoskeletal: Normal range of motion.  Neurological: She is alert and oriented to person, place, and time.  Skin: Skin is warm, dry and intact. No rash noted. No pallor.  Psychiatric: She has a normal mood and affect. Her behavior is normal. Judgment and thought content normal.    Results for orders placed or performed during the hospital encounter of 08/09/16  I-STAT creatinine  Result Value Ref Range   Creatinine, Ser 1.40 (H) 0.44 - 1.00 mg/dL      Assessment & Plan:   Problem List Items Addressed This Visit      Unprioritized   Essential hypertension    Stable, continue present medications.        Relevant Medications   atorvastatin (LIPITOR) 80 MG tablet   Hyperlipemia    Stable, continue present medications.        Relevant Medications   atorvastatin (LIPITOR) 80 MG tablet   Type 2 diabetes mellitus with stage 3 chronic kidney disease, without long-term current use of insulin (HCC) - Primary    Hgb A1C is 6.2.  Stable, continue present medications.  Relevant Medications   atorvastatin (LIPITOR) 80 MG tablet   Other Relevant Orders   Comprehensive metabolic panel   Bayer DCA Hb A1c Waived       Follow up plan: Return in about 6 months (around 03/14/2017).

## 2016-09-11 NOTE — Assessment & Plan Note (Signed)
Stable, continue present medications.   

## 2016-09-11 NOTE — Assessment & Plan Note (Addendum)
Hgb A1C is 6.2.  Stable, continue present medications.

## 2016-09-12 LAB — COMPREHENSIVE METABOLIC PANEL
ALK PHOS: 71 IU/L (ref 39–117)
ALT: 36 IU/L — AB (ref 0–32)
AST: 40 IU/L (ref 0–40)
Albumin/Globulin Ratio: 1.6 (ref 1.2–2.2)
Albumin: 4.3 g/dL (ref 3.6–4.8)
BILIRUBIN TOTAL: 0.5 mg/dL (ref 0.0–1.2)
BUN / CREAT RATIO: 12 (ref 12–28)
BUN: 14 mg/dL (ref 8–27)
CHLORIDE: 103 mmol/L (ref 96–106)
CO2: 24 mmol/L (ref 20–29)
Calcium: 9.4 mg/dL (ref 8.7–10.3)
Creatinine, Ser: 1.13 mg/dL — ABNORMAL HIGH (ref 0.57–1.00)
GFR calc Af Amer: 58 mL/min/{1.73_m2} — ABNORMAL LOW (ref >59)
GFR calc non Af Amer: 50 mL/min/{1.73_m2} — ABNORMAL LOW (ref >59)
Globulin, Total: 2.7 g/dL (ref 1.5–4.5)
Glucose: 101 mg/dL — ABNORMAL HIGH (ref 65–99)
POTASSIUM: 4.5 mmol/L (ref 3.5–5.2)
Sodium: 144 mmol/L (ref 134–144)
Total Protein: 7 g/dL (ref 6.0–8.5)

## 2016-09-12 LAB — BAYER DCA HB A1C WAIVED: HB A1C: 6.2 % (ref <7.0)

## 2016-09-13 ENCOUNTER — Encounter: Payer: Self-pay | Admitting: Unknown Physician Specialty

## 2016-09-16 ENCOUNTER — Ambulatory Visit
Admission: RE | Admit: 2016-09-16 | Discharge: 2016-09-16 | Disposition: A | Discharge status: Home / Self Care | Payer: PRIVATE HEALTH INSURANCE | Source: Ambulatory Visit | Attending: Surgery | Admitting: Surgery

## 2016-09-16 DIAGNOSIS — Z01818 Encounter for other preprocedural examination: Secondary | ICD-10-CM | POA: Diagnosis not present

## 2016-09-16 DIAGNOSIS — K449 Diaphragmatic hernia without obstruction or gangrene: Secondary | ICD-10-CM | POA: Insufficient documentation

## 2016-09-16 HISTORY — DX: Chronic kidney disease, unspecified: N18.9

## 2016-09-16 HISTORY — DX: Hypothyroidism, unspecified: E03.9

## 2016-09-16 HISTORY — DX: Anemia, unspecified: D64.9

## 2016-09-16 NOTE — Patient Instructions (Signed)
  Your procedure is scheduled on: Tuesday September 24, 2016. Report to Same Day Surgery. To find out your arrival time please call [phone] between 1PM - 3PM on Monday September 23, 2016.  Remember: Instructions that are not followed completely may result in serious medical risk, up to and including death, or upon the discretion of your surgeon and anesthesiologist your surgery may need to be rescheduled.    _x___ 1. Do not eat food or drink liquids after midnight. No gum chewing or hard candies.     ____ 2. No Alcohol for 24 hours before or after surgery.   ____ 3. Bring all medications with you on the day of surgery if instructed.    __x__ 4. Notify your doctor if there is any change in your medical condition     (cold, fever, infections).    _____ 5. No smoking 24 hours prior to surgery.     Do not wear jewelry, make-up, hairpins, clips or nail polish.  Do not wear lotions, powders, or perfumes.   Do not shave 48 hours prior to surgery. Men may shave face and neck.  Do not bring valuables to the hospital.    [facility] is not responsible for any belongings or valuables.               Contacts, dentures or bridgework may not be worn into surgery.  Leave your suitcase in the car. After surgery it may be brought to your room.  For patients admitted to the hospital, discharge time is determined by your treatment team.   Patients discharged the day of surgery will not be allowed to drive home.    Please read over the following fact sheets that you were given:   [facility] Preparing for Surgery  __x__ Take these medicines the morning of surgery with A SIP OF WATER:    1. amLODipine (NORVASC)   2. atorvastatin (LIPITOR)  3. lansoprazole (PREVACID)  4. levothyroxine (SYNTHROID, LEVOTHROID)  5. metoprolol succinate (TOPROL-XL)  6. olmesartan (BENICAR)  ____ Fleet Enema (as directed)   _x___ Use CHG Soap as directed on instruction sheet  ____ Use inhalers on the day of surgery  and bring to hospital day of surgery  ____ Stop metformin 2 days prior to surgery    ____ Take 1/2 of usual insulin dose the night before surgery and none on the morning of  surgery.   ____ Stop Plavix and/ or aspirin on   _x___ Stop Anti-inflammatories such as Advil, Aleve, Ibuprofen, Motrin, Naproxen, Naprosyn, Goodies powders or aspirin  products. OK to take Tylenol.   ____ Stop supplements until after surgery.    ____ Bring C-Pap to the hospital.

## 2016-09-16 NOTE — Pre-Procedure Instructions (Signed)
Noticed that in Dr. Corlis Leak office note that pt is to stop Plavix at least 1 week  Prior to surgery based on cardiac clearance.  Called Dr. Corlis Leak office to see when pt should stop Plavix and if she should stop her aspirin.

## 2016-09-17 NOTE — Pre-Procedure Instructions (Signed)
[place]

## 2016-09-18 NOTE — Pre-Procedure Instructions (Signed)
WAITING ON STRESS RESULTS AND ASPIRIN INSTRUCTIONS

## 2016-09-23 MED ORDER — DEXTROSE 5 % IV SOLN
2.0000 g | INTRAVENOUS | Status: AC
  Filled 2016-09-23 (×2): qty 2

## 2016-09-23 MED ORDER — HEPARIN SODIUM (PORCINE) 5000 UNIT/ML IJ SOLN
5000.0000 [IU] | Freq: Once | INTRAMUSCULAR | Status: AC
  Administered 2016-09-24: 5000 [IU] via SUBCUTANEOUS

## 2016-09-24 ENCOUNTER — Inpatient Hospital Stay: Payer: PRIVATE HEALTH INSURANCE | Admitting: Anesthesiology

## 2016-09-24 ENCOUNTER — Encounter
Admission: RE | Disposition: A | Discharge status: Home / Self Care | Payer: Self-pay | Source: Ambulatory Visit | Attending: Surgery

## 2016-09-24 ENCOUNTER — Inpatient Hospital Stay
Admission: RE | Admit: 2016-09-24 | Discharge: 2016-09-25 | DRG: 328 | Disposition: A | Discharge status: Home / Self Care | Payer: PRIVATE HEALTH INSURANCE | Source: Ambulatory Visit | Attending: Surgery | Admitting: Surgery

## 2016-09-24 ENCOUNTER — Encounter: Payer: Self-pay | Admitting: *Deleted

## 2016-09-24 DIAGNOSIS — Z882 Allergy status to sulfonamides status: Secondary | ICD-10-CM | POA: Diagnosis not present

## 2016-09-24 DIAGNOSIS — M1990 Unspecified osteoarthritis, unspecified site: Secondary | ICD-10-CM | POA: Diagnosis present

## 2016-09-24 DIAGNOSIS — E669 Obesity, unspecified: Secondary | ICD-10-CM | POA: Diagnosis present

## 2016-09-24 DIAGNOSIS — K219 Gastro-esophageal reflux disease without esophagitis: Secondary | ICD-10-CM | POA: Diagnosis present

## 2016-09-24 DIAGNOSIS — Z841 Family history of disorders of kidney and ureter: Secondary | ICD-10-CM | POA: Diagnosis not present

## 2016-09-24 DIAGNOSIS — Z833 Family history of diabetes mellitus: Secondary | ICD-10-CM

## 2016-09-24 DIAGNOSIS — Z955 Presence of coronary angioplasty implant and graft: Secondary | ICD-10-CM | POA: Diagnosis not present

## 2016-09-24 DIAGNOSIS — Z9889 Other specified postprocedural states: Secondary | ICD-10-CM

## 2016-09-24 DIAGNOSIS — Z8249 Family history of ischemic heart disease and other diseases of the circulatory system: Secondary | ICD-10-CM

## 2016-09-24 DIAGNOSIS — Z6837 Body mass index (BMI) 37.0-37.9, adult: Secondary | ICD-10-CM | POA: Diagnosis not present

## 2016-09-24 DIAGNOSIS — Z8349 Family history of other endocrine, nutritional and metabolic diseases: Secondary | ICD-10-CM

## 2016-09-24 DIAGNOSIS — I2510 Atherosclerotic heart disease of native coronary artery without angina pectoris: Secondary | ICD-10-CM | POA: Diagnosis present

## 2016-09-24 DIAGNOSIS — I509 Heart failure, unspecified: Secondary | ICD-10-CM | POA: Diagnosis present

## 2016-09-24 DIAGNOSIS — I110 Hypertensive heart disease with heart failure: Secondary | ICD-10-CM | POA: Diagnosis present

## 2016-09-24 DIAGNOSIS — K449 Diaphragmatic hernia without obstruction or gangrene: Secondary | ICD-10-CM | POA: Diagnosis not present

## 2016-09-24 DIAGNOSIS — E119 Type 2 diabetes mellitus without complications: Secondary | ICD-10-CM | POA: Diagnosis not present

## 2016-09-24 DIAGNOSIS — R1310 Dysphagia, unspecified: Secondary | ICD-10-CM | POA: Diagnosis present

## 2016-09-24 DIAGNOSIS — Z91040 Latex allergy status: Secondary | ICD-10-CM

## 2016-09-24 HISTORY — PX: HIATAL HERNIA REPAIR: SHX195

## 2016-09-24 LAB — CREATININE, SERUM
Creatinine, Ser: 1.41 mg/dL — ABNORMAL HIGH (ref 0.44–1.00)
GFR calc Af Amer: 43 mL/min — ABNORMAL LOW (ref >60)
GFR calc non Af Amer: 37 mL/min — ABNORMAL LOW (ref >60)

## 2016-09-24 LAB — CBC
HEMATOCRIT: 37.2 % (ref 35.0–47.0)
Hemoglobin: 12.6 g/dL (ref 12.0–16.0)
MCH: 32.8 pg (ref 26.0–34.0)
MCHC: 34 g/dL (ref 32.0–36.0)
MCV: 96.6 fL (ref 80.0–100.0)
Platelets: 142 10*3/uL — ABNORMAL LOW (ref 150–440)
RBC: 3.85 MIL/uL (ref 3.80–5.20)
RDW: 13.6 % (ref 11.5–14.5)
WBC: 10.4 10*3/uL (ref 3.6–11.0)

## 2016-09-24 LAB — GLUCOSE, CAPILLARY
Glucose-Capillary: 92 mg/dL (ref 65–99)
Glucose-Capillary: 151 mg/dL — ABNORMAL HIGH (ref 65–99)

## 2016-09-24 SURGERY — REPAIR, HERNIA, HIATAL, LAPAROSCOPIC
Anesthesia: General

## 2016-09-24 MED ORDER — ACETAMINOPHEN 10 MG/ML IV SOLN
INTRAVENOUS | Status: DC | PRN
  Administered 2016-09-24: 1000 mg via INTRAVENOUS

## 2016-09-24 MED ORDER — ROCURONIUM BROMIDE 50 MG/5ML IV SOLN
INTRAVENOUS | Status: AC
  Filled 2016-09-24: qty 1

## 2016-09-24 MED ORDER — ONDANSETRON HCL 4 MG/2ML IJ SOLN
INTRAMUSCULAR | Status: DC | PRN
  Administered 2016-09-24: 4 mg via INTRAVENOUS

## 2016-09-24 MED ORDER — DEXTROSE 5 % IV SOLN
INTRAVENOUS | Status: DC | PRN
  Administered 2016-09-24: 2 g via INTRAVENOUS

## 2016-09-24 MED ORDER — FENTANYL CITRATE (PF) 100 MCG/2ML IJ SOLN
INTRAMUSCULAR | Status: AC
  Filled 2016-09-24: qty 4

## 2016-09-24 MED ORDER — EVICEL 5 ML EX KIT
PACK | CUTANEOUS | Status: AC
  Filled 2016-09-24: qty 1

## 2016-09-24 MED ORDER — MORPHINE SULFATE (PF) 4 MG/ML IV SOLN: 2.0000 mg | INTRAVENOUS | Status: DC | PRN

## 2016-09-24 MED ORDER — KETOROLAC TROMETHAMINE 30 MG/ML IJ SOLN
INTRAMUSCULAR | Status: DC | PRN
  Administered 2016-09-24: 30 mg via INTRAVENOUS

## 2016-09-24 MED ORDER — EPHEDRINE SULFATE 50 MG/ML IJ SOLN
INTRAMUSCULAR | Status: AC
  Filled 2016-09-24: qty 1

## 2016-09-24 MED ORDER — CHLORHEXIDINE GLUCONATE CLOTH 2 % EX PADS: 6.0000 | MEDICATED_PAD | Freq: Once | CUTANEOUS | Status: DC

## 2016-09-24 MED ORDER — LIDOCAINE HCL (PF) 1 % IJ SOLN
INTRAMUSCULAR | Status: DC | PRN
  Administered 2016-09-24: 30 mL

## 2016-09-24 MED ORDER — ONDANSETRON HCL 4 MG/2ML IJ SOLN
INTRAMUSCULAR | Status: AC
  Filled 2016-09-24: qty 2

## 2016-09-24 MED ORDER — MIDAZOLAM HCL 2 MG/2ML IJ SOLN
INTRAMUSCULAR | Status: AC
  Filled 2016-09-24: qty 2

## 2016-09-24 MED ORDER — SUGAMMADEX SODIUM 200 MG/2ML IV SOLN
INTRAVENOUS | Status: DC | PRN
  Administered 2016-09-24: 200 mg via INTRAVENOUS

## 2016-09-24 MED ORDER — IPRATROPIUM-ALBUTEROL 0.5-2.5 (3) MG/3ML IN SOLN
RESPIRATORY_TRACT | Status: AC
  Filled 2016-09-24: qty 3

## 2016-09-24 MED ORDER — ROCURONIUM BROMIDE 100 MG/10ML IV SOLN
INTRAVENOUS | Status: DC | PRN
  Administered 2016-09-24: 10 mg via INTRAVENOUS
  Administered 2016-09-24: 35 mg via INTRAVENOUS
  Administered 2016-09-24: 10 mg via INTRAVENOUS
  Administered 2016-09-24: 5 mg via INTRAVENOUS
  Administered 2016-09-24 (×2): 10 mg via INTRAVENOUS

## 2016-09-24 MED ORDER — HYDRALAZINE HCL 20 MG/ML IJ SOLN: 10.0000 mg | INTRAMUSCULAR | Status: DC | PRN

## 2016-09-24 MED ORDER — LIDOCAINE HCL (PF) 2 % IJ SOLN
INTRAMUSCULAR | Status: AC
  Filled 2016-09-24: qty 2

## 2016-09-24 MED ORDER — PROPOFOL 10 MG/ML IV BOLUS
INTRAVENOUS | Status: AC
  Filled 2016-09-24: qty 20

## 2016-09-24 MED ORDER — PANTOPRAZOLE SODIUM 40 MG IV SOLR
40.0000 mg | Freq: Every day | INTRAVENOUS | Status: DC
  Administered 2016-09-24: 40 mg via INTRAVENOUS
  Filled 2016-09-24: qty 40

## 2016-09-24 MED ORDER — DEXAMETHASONE SODIUM PHOSPHATE 10 MG/ML IJ SOLN
INTRAMUSCULAR | Status: DC | PRN
  Administered 2016-09-24: 4 mg via INTRAVENOUS

## 2016-09-24 MED ORDER — LIDOCAINE HCL (CARDIAC) 20 MG/ML IV SOLN
INTRAVENOUS | Status: DC | PRN
  Administered 2016-09-24: 80 mg via INTRAVENOUS

## 2016-09-24 MED ORDER — SUCCINYLCHOLINE CHLORIDE 20 MG/ML IJ SOLN
INTRAMUSCULAR | Status: DC | PRN
  Administered 2016-09-24: 100 mg via INTRAVENOUS

## 2016-09-24 MED ORDER — FENTANYL CITRATE (PF) 100 MCG/2ML IJ SOLN
INTRAMUSCULAR | Status: AC
  Filled 2016-09-24: qty 2

## 2016-09-24 MED ORDER — ENOXAPARIN SODIUM 40 MG/0.4ML ~~LOC~~ SOLN
40.0000 mg | SUBCUTANEOUS | Status: DC
  Administered 2016-09-24: 40 mg via SUBCUTANEOUS
  Filled 2016-09-24: qty 0.4

## 2016-09-24 MED ORDER — BUPIVACAINE-EPINEPHRINE 0.25% -1:200000 IJ SOLN
INTRAMUSCULAR | Status: DC | PRN
  Administered 2016-09-24: 30 mL

## 2016-09-24 MED ORDER — LIDOCAINE HCL (PF) 1 % IJ SOLN
INTRAMUSCULAR | Status: AC
  Filled 2016-09-24: qty 30

## 2016-09-24 MED ORDER — KETOROLAC TROMETHAMINE 15 MG/ML IJ SOLN
15.0000 mg | Freq: Four times a day (QID) | INTRAMUSCULAR | Status: DC
  Administered 2016-09-24 – 2016-09-25 (×3): 15 mg via INTRAVENOUS
  Filled 2016-09-24 (×3): qty 1

## 2016-09-24 MED ORDER — ONDANSETRON HCL 4 MG/2ML IJ SOLN
4.0000 mg | Freq: Four times a day (QID) | INTRAMUSCULAR | Status: DC | PRN
Start: 2016-09-24 — End: 2016-09-25

## 2016-09-24 MED ORDER — IPRATROPIUM-ALBUTEROL 0.5-2.5 (3) MG/3ML IN SOLN
3.0000 mL | RESPIRATORY_TRACT | Status: DC
  Administered 2016-09-24: 3 mL via RESPIRATORY_TRACT

## 2016-09-24 MED ORDER — SODIUM CHLORIDE 0.9 % IJ SOLN
INTRAMUSCULAR | Status: AC
  Filled 2016-09-24: qty 10

## 2016-09-24 MED ORDER — SODIUM CHLORIDE 0.9 % IV SOLN
INTRAVENOUS | Status: DC
  Administered 2016-09-24: 07:00:00 via INTRAVENOUS

## 2016-09-24 MED ORDER — EPHEDRINE SULFATE 50 MG/ML IJ SOLN
INTRAMUSCULAR | Status: DC | PRN
Start: 2016-09-24 — End: 2016-09-24
  Administered 2016-09-24 (×4): 5 mg via INTRAVENOUS

## 2016-09-24 MED ORDER — SUCCINYLCHOLINE CHLORIDE 20 MG/ML IJ SOLN
INTRAMUSCULAR | Status: AC
  Filled 2016-09-24: qty 1

## 2016-09-24 MED ORDER — MIDAZOLAM HCL 2 MG/2ML IJ SOLN
INTRAMUSCULAR | Status: DC | PRN
  Administered 2016-09-24: 1 mg via INTRAVENOUS

## 2016-09-24 MED ORDER — PROCHLORPERAZINE MALEATE 10 MG PO TABS
10.0000 mg | ORAL_TABLET | Freq: Four times a day (QID) | ORAL | Status: DC | PRN
  Filled 2016-09-24: qty 1

## 2016-09-24 MED ORDER — ONDANSETRON HCL 4 MG/2ML IJ SOLN: 4.0000 mg | Freq: Once | INTRAMUSCULAR | Status: DC | PRN

## 2016-09-24 MED ORDER — FENTANYL CITRATE (PF) 100 MCG/2ML IJ SOLN
INTRAMUSCULAR | Status: DC | PRN
  Administered 2016-09-24: 50 ug via INTRAVENOUS
  Administered 2016-09-24: 100 ug via INTRAVENOUS
  Administered 2016-09-24: 50 ug via INTRAVENOUS

## 2016-09-24 MED ORDER — ONDANSETRON 4 MG PO TBDP: 4.0000 mg | ORAL_TABLET | Freq: Four times a day (QID) | ORAL | Status: DC | PRN

## 2016-09-24 MED ORDER — DEXAMETHASONE SODIUM PHOSPHATE 10 MG/ML IJ SOLN
INTRAMUSCULAR | Status: AC
  Filled 2016-09-24: qty 1

## 2016-09-24 MED ORDER — BUPIVACAINE HCL (PF) 0.25 % IJ SOLN
INTRAMUSCULAR | Status: AC
  Filled 2016-09-24: qty 30

## 2016-09-24 MED ORDER — PROCHLORPERAZINE EDISYLATE 5 MG/ML IJ SOLN
5.0000 mg | Freq: Four times a day (QID) | INTRAMUSCULAR | Status: DC | PRN
  Filled 2016-09-24: qty 2

## 2016-09-24 MED ORDER — LACTATED RINGERS IV SOLN
INTRAVENOUS | Status: DC
  Administered 2016-09-24 – 2016-09-25 (×2): via INTRAVENOUS

## 2016-09-24 MED ORDER — OXYCODONE-ACETAMINOPHEN 5-325 MG PO TABS: 1.0000 | ORAL_TABLET | ORAL | Status: DC | PRN

## 2016-09-24 MED ORDER — HEPARIN SODIUM (PORCINE) 5000 UNIT/ML IJ SOLN
INTRAMUSCULAR | Status: AC
  Filled 2016-09-24: qty 1

## 2016-09-24 MED ORDER — PROPOFOL 10 MG/ML IV BOLUS
INTRAVENOUS | Status: DC | PRN
  Administered 2016-09-24: 120 mg via INTRAVENOUS

## 2016-09-24 MED ORDER — EPINEPHRINE PF 1 MG/ML IJ SOLN
INTRAMUSCULAR | Status: AC
  Filled 2016-09-24: qty 1

## 2016-09-24 MED ORDER — FENTANYL CITRATE (PF) 100 MCG/2ML IJ SOLN
25.0000 ug | INTRAMUSCULAR | Status: DC | PRN
  Administered 2016-09-24 (×4): 25 ug via INTRAVENOUS

## 2016-09-24 MED ORDER — ACETAMINOPHEN 500 MG PO TABS
1000.0000 mg | ORAL_TABLET | Freq: Four times a day (QID) | ORAL | Status: DC
  Administered 2016-09-24 – 2016-09-25 (×4): 1000 mg via ORAL
  Filled 2016-09-24 (×4): qty 2

## 2016-09-24 MED ORDER — ACETAMINOPHEN 10 MG/ML IV SOLN
INTRAVENOUS | Status: AC
  Filled 2016-09-24: qty 100

## 2016-09-24 SURGICAL SUPPLY — 53 items
APPLIER CLIP 5 13 M/L LIGAMAX5 (MISCELLANEOUS) ×3
BLADE SURG SZ11 CARB STEEL (BLADE) ×3 IMPLANT
CANISTER SUCT 1200ML W/VALVE (MISCELLANEOUS) ×3 IMPLANT
CHLORAPREP W/TINT 26ML (MISCELLANEOUS) ×3 IMPLANT
CLIP APPLIE 5 13 M/L LIGAMAX5 (MISCELLANEOUS) ×1 IMPLANT
DEFOGGER SCOPE WARMER CLEARIFY (MISCELLANEOUS) ×3 IMPLANT
DERMABOND ADVANCED (GAUZE/BANDAGES/DRESSINGS) ×2
DERMABOND ADVANCED .7 DNX12 (GAUZE/BANDAGES/DRESSINGS) ×1 IMPLANT
DEVICE SECURE STRAP 25 ABSORB (INSTRUMENTS) IMPLANT
DEVICE SUTURE ENDOST 10MM (ENDOMECHANICALS) IMPLANT
DRAPE INCISE IOBAN 66X45 STRL (DRAPES) ×3 IMPLANT
DRAPE LEGGINS SURG 28X43 STRL (DRAPES) ×3 IMPLANT
ELECT CAUTERY BLADE 6.4 (BLADE) ×3 IMPLANT
ELECT REM PT RETURN 9FT ADLT (ELECTROSURGICAL) ×3
ELECTRODE REM PT RTRN 9FT ADLT (ELECTROSURGICAL) ×1 IMPLANT
ENDOSLIDE KNOT PUSHER 174510 (MISCELLANEOUS) ×3 IMPLANT
EVICEL 5ML SEALNT HUMAN B (Miscellaneous) ×3 IMPLANT
GLOVE BIOGEL M 7.0 STRL (GLOVE) ×3 IMPLANT
GOWN STRL REUS W/ TWL LRG LVL3 (GOWN DISPOSABLE) ×3 IMPLANT
GOWN STRL REUS W/TWL LRG LVL3 (GOWN DISPOSABLE) ×6
GRASPER SUT TROCAR 14GX15 (MISCELLANEOUS) ×3 IMPLANT
IRRIGATION STRYKERFLOW (MISCELLANEOUS) ×1 IMPLANT
IRRIGATOR STRYKERFLOW (MISCELLANEOUS) ×3
IV NS 1000ML (IV SOLUTION) ×2
IV NS 1000ML BAXH (IV SOLUTION) ×1 IMPLANT
KIT PINK PAD W/HEAD ARE REST (MISCELLANEOUS) ×3
KIT PINK PAD W/HEAD ARM REST (MISCELLANEOUS) ×1 IMPLANT
L-HOOK LAP DISP 36CM (ELECTROSURGICAL)
LABEL OR SOLS (LABEL) IMPLANT
LHOOK LAP DISP 36CM (ELECTROSURGICAL) IMPLANT
MESH BIO-A 7X10 SYN MAT (Mesh General) ×3 IMPLANT
NDL SAFETY 22GX1.5 (NEEDLE) ×3 IMPLANT
NS IRRIG 500ML POUR BTL (IV SOLUTION) ×3 IMPLANT
PACK LAP CHOLECYSTECTOMY (MISCELLANEOUS) ×3 IMPLANT
PENCIL ELECTRO HAND CTR (MISCELLANEOUS) ×3 IMPLANT
SCISSORS METZENBAUM CVD 33 (INSTRUMENTS) ×3 IMPLANT
SHEARS HARMONIC ACE PLUS 36CM (ENDOMECHANICALS) ×3 IMPLANT
SLEEVE ENDOPATH XCEL 5M (ENDOMECHANICALS) ×12 IMPLANT
SPONGE LAP 18X18 5 PK (GAUZE/BANDAGES/DRESSINGS) ×3 IMPLANT
SUT ETHIBOND 0 36 GRN (SUTURE) ×18 IMPLANT
SUT MNCRL 4-0 (SUTURE) ×4
SUT MNCRL 4-0 27XMFL (SUTURE) ×2
SUT VIC AB 3-0 SH 27 (SUTURE) ×4
SUT VIC AB 3-0 SH 27X BRD (SUTURE) ×2 IMPLANT
SUT VICRYL 0 AB UR-6 (SUTURE) ×6 IMPLANT
SUTURE MNCRL 4-0 27XMF (SUTURE) ×2 IMPLANT
TIP RIGID 35CM EVICEL (HEMOSTASIS) ×3 IMPLANT
TRAY FOLEY CATH SILVER 16FR LF (SET/KITS/TRAYS/PACK) ×3 IMPLANT
TROCAR 12M 150ML BLUNT (TROCAR) ×3 IMPLANT
TROCAR XCEL BLUNT TIP 100MML (ENDOMECHANICALS) ×3 IMPLANT
TROCAR XCEL NON-BLD 11X100MML (ENDOMECHANICALS) ×3 IMPLANT
TROCAR XCEL NON-BLD 5MMX100MML (ENDOMECHANICALS) ×3 IMPLANT
TUBING INSUF HEATED (TUBING) ×3 IMPLANT

## 2016-09-24 NOTE — Op Note (Signed)
PROCEDURES: 1. Laparoscopic repair of type III hiatal hernia with Bio A mesh 2. Toupee fundoplication (696 degrees )  Pre-operative Diagnosis: Type III symptomatic hiatal hernia  Post-operative Diagnosis: Same  Surgeon: Marjory Lies Gianpaolo Mindel   Assistants: Dr. Faith Rogue  Anesthesia: General endotracheal anesthesia  ASA Class: 2, 3  Surgeon: Caroleen Hamman , MD FACS  Anesthesia: Gen. with endotracheal tube   Findings: Large hiatal hernia and good repair of hernia with mesh. Nice floppy wrap. Nodular liver consistent with steatosis   Estimated Blood Loss: 25cc         Drains: none     Complications: none         Condition: stable  Procedure Details  The patient was seen again in the Holding Room. The benefits, complications, treatment options, and expected outcomes were discussed with the patient. The risks of bleeding, infection, recurrence of symptoms, failure to resolve symptoms,  bowel injury, any of which could require further surgery were reviewed with the patient.   The patient was taken to Operating Room, identified as Erica Larsen and the procedure verified.  A Time Out was held and the above information confirmed.   Prior to the induction of general anesthesia, antibiotic prophylaxis was administered. VTE prophylaxis was in place. General endotracheal anesthesia was then administered and tolerated well. After the induction, the abdomen was prepped with Chloraprep and draped in the sterile fashion. The patient was positioned in lithotomy position with all pressure points padded.  Her with a midline supraumbilical incision and using the cut down technique the fascia was incised between 2 Coker clamps and 2 stay sutures were placed. Trocar Placed in the Standard Fashion and Pneumoperitoneum Was Obtained. Patient was placed in a reverse Trendelenburg position and a 11 mm incision was created in the right upper quadrant along with 35 mm incisions created one in the subcostal region on  the right side and another one on the left side and a 5 mm port in the left lower quadrant. These were placed under direct visualization.  Action of the abdomen revealed evidence of an enlarged liver consistent with steatosis along with some nodular infiltration. No evidence of overt cirrhosis, no evidence of ascites.  Liver retractor was placed in the standard fashion and we started our approach incising the pars flaccida with the Harmonic scalpel. We were able to dissect all the way to the lesser curvature to the right crus in the standard fashion. This there was significant redundant sac that were able to divide and dissect circumferentially in the standard fashion. Attention then was turned to the greater curvature were the short gastric vessels were clipped and divided in the standard fashion and also the greater omentum was divided with the Harmonic scalpel. We were able to reach all the way to the left crus in the standard fashion and using the Harmonic scalpel were able to further reduce the hernia sac and bring the fundus of stomach intra-abdominally. Once we assured at all gastric contents where below the diaphragm we assessed as significant heart or hernia. Using multiple 0 Ethibond sutures we closed the posterior aspect of the hernia in the standard fashion. I was unable then able to bring the fundus of the stomach to the right side for a fundoplication. Please note that we always protected the esophagus and the vagus nerves. We selected a bio a hiatal hernia mesh and place it posterior to the esophagus and fixated this with enterocele in the standard fashion. Because I was not going  to grade I flow fundoplication I decided not to use a bougie and to decrease the chance of a potential esophageal injury. I created a 270 wrap using interrupted Ethibond sutures. I tacked the wrap on both sides to the crew and to the mesh in the standard fashion. I was very happy with the result of the fundoplication.  At the end of the procedure there was adequate hemostasis and no evidence of any injuries. All the laparoscopic ports were removed under direct visualization.  The fascia for the 12 mm ports were closed with interrupted Vicryl sutures in the standard fashion and the skin incisions were closed with 4-0 Monocryl. Marcaine was injected on all incision sites under direct visualization. Dermabond was used to coat all the skin incisions. Needle and laparotomy count were correct and there were no immediate occasions  Caroleen Hamman, MD, FACS

## 2016-09-24 NOTE — Progress Notes (Signed)
Patient states now she feels better, no longer feels Like she is having trouble taking a deep breath.  No Acute distress and will continue to monitor patient.

## 2016-09-24 NOTE — Interval H&P Note (Signed)
History and Physical Interval Note:  09/24/2016 7:10 AM  Erica Larsen  has presented today for surgery, with the diagnosis of Hiatal hernia  The various methods of treatment have been discussed with the patient and family. After consideration of risks, benefits and other options for treatment, the patient has consented to  Procedure(s) with comments: LAPAROSCOPIC REPAIR OF HIATAL HERNIA WITH FUNDOPLICATION WITH MESH (BIO-A) (N/A) - Lithotomy position w arms tucked.  as a surgical intervention .  The patient's history has been reviewed, patient examined, no change in status, stable for surgery.  I have reviewed the patient's chart and labs.  Questions were answered to the patient's satisfaction.     [place]

## 2016-09-24 NOTE — Transfer of Care (Signed)
Immediate Anesthesia Transfer of Care Note  Patient: Erica Larsen  Procedure(s) Performed: Procedure(s) with comments: LAPAROSCOPIC REPAIR OF HIATAL HERNIA WITH FUNDOPLICATION WITH MESH (BIO-A) (N/A) - Lithotomy position w arms tucked.   Patient Location: PACU  Anesthesia Type:General  Level of Consciousness: sedated and responds to stimulation  Airway & Oxygen Therapy: Patient Spontanous Breathing and Patient connected to face mask oxygen  Post-op Assessment: Report given to RN and Post -op Vital signs reviewed and stable  Post vital signs: Reviewed and stable  Last Vitals:  Vitals:   09/24/16 0606 09/24/16 1131  BP: (!) 174/82 107/60  Pulse: 81 78  Resp: 16 14  Temp: (!) 36.4 C 36.9 C    Last Pain:  Vitals:   09/24/16 1131  TempSrc:   PainSc: 0-No pain         Complications: No apparent anesthesia complications

## 2016-09-24 NOTE — Anesthesia Post-op Follow-up Note (Signed)
Anesthesia QCDR form completed.        

## 2016-09-24 NOTE — H&P (View-Only) (Signed)
Surgical Consultation  08/28/2016  Erica Larsen is an 66 y.o. female.   Chief Complaint  Patient presents with  . Follow-up    hiatal hernia     HPI: Erica Larsen is f/u For hiatal hernia and further workup. I have personally reviewed both the upper GI series and a CT scan of the chest and abdomen and pelvis. There is evidence of a large hiatal hernia with half the stomach in her chest. There is also evidence of good contrast going to the stomach and the duodenum. There is no evidence of any dysmotility disorder or esophageal lesions. Significant reflux. Patient continues to have significant reflux and some dysphagia for solids. She has been trying to lose some weight but is been difficult since she had a recent eye or seizure. Now she is more motivated to do some exercise Results discussed with the patient in detail  Past Medical History:  Diagnosis Date  . Arthritis    osteoarthritis  . CHF (congestive heart failure) ([place])   . Coronary artery disease   . Diabetes mellitus without complication ([place])   . Diverticulosis   . Esophagitis   . GERD (gastroesophageal reflux disease)   . Hiatal hernia   . High cholesterol   . Hypercalcemia   . Hypertension   . Lumbar radiculopathy   . Obesity   . Osteopenia    Hips  . Thyroid disease     Past Surgical History:  Procedure Laterality Date  . COLONOSCOPY WITH PROPOFOL N/A 08/01/2015   Procedure: COLONOSCOPY WITH PROPOFOL;  Surgeon: Lucilla Lame, MD;  Location: [facility] ENDOSCOPY;  Service: Endoscopy;  Laterality: N/A;  . CORONARY ANGIOPLASTY WITH STENT PLACEMENT    . ESOPHAGOGASTRODUODENOSCOPY (EGD) WITH PROPOFOL N/A 05/14/2016   Procedure: ESOPHAGOGASTRODUODENOSCOPY (EGD) WITH PROPOFOL;  Surgeon: Lucilla Lame, MD;  Location: [facility] ENDOSCOPY;  Service: Endoscopy;  Laterality: N/A;  . GIVENS CAPSULE STUDY N/A 06/01/2015   Procedure: GIVENS CAPSULE STUDY;  Surgeon: Lucilla Lame, MD;  Location: [facility] ENDOSCOPY;  Service: Endoscopy;  Laterality: N/A;   . pre cancer removed     left forearm    Family History  Problem Relation Age of Onset  . Heart disease Mother   . Kidney failure Mother   . Hypertension Mother   . Diabetes Mother   . Heart attack Father   . Hypertension Father   . Thyroid disease Father   . Hypertension Sister   . Diabetes Sister   . Thyroid disease Daughter   . Thyroid disease Daughter     Social History:  reports that she has never smoked. She has never used smokeless tobacco. She reports that she does not drink alcohol or use drugs.  Allergies:  Allergies  Allergen Reactions  . Sulfa Antibiotics Anaphylaxis and Swelling  . Latex Rash    Medications reviewed.     ROS Full ROS performed and is otherwise negative other than what is stated in the HPI    BP (!) 143/73   Pulse 76   Temp 97.8 F (36.6 C) (Oral)   Ht 5\' 2"  (1.575 m)   Wt 93.9 kg (207 lb)   BMI 37.86 kg/m    Physical Exam  Constitutional: She is oriented to person, place, and time and well-developed, well-nourished, and in no distress. No distress.  Neck: Normal range of motion. No JVD present. No tracheal deviation present.  Cardiovascular: Normal rate and regular rhythm.   No murmur heard. Pulmonary/Chest: Effort normal and breath sounds normal. No respiratory distress. She  has no wheezes. She has no rales.  Abdominal: Soft. She exhibits no distension and no mass. There is no tenderness. There is no rebound and no guarding.  Musculoskeletal: Normal range of motion. She exhibits no tenderness or deformity.  Neurological: She is alert and oriented to person, place, and time. Gait normal. GCS score is 15.  Skin: Skin is warm and dry. She is not diaphoretic.  Psychiatric: Mood, memory, affect and judgment normal.  Nursing note and vitals reviewed.    Assessment/Plan: Symptomatic hiatal hernia ( type III) in need for surgical intervention. Discussed with the patient in detail upper options. Laparoscopic versus open repair  with fundoplication. I do think that we can attempt a laparoscopic repair with a partial fundoplication and placement of a bioprosthetic. There is a good chance that we'll have to convert to open given the size and patient body habitus. Encouraged again to lose some more weight and exercise. We will obtain a final coronary clearance as she needs to be off Plavix for 1 week at least. Procedure discussed with the patient in detail, risk, benefits and possible complications including but not limited to: Bleeding, infection, esophageal injury, pneumothorax, chronic pain, dysphagia, recurrence. She understands and wishes to proceed. We'll plan for elective hiatal hernia repair with fundoplication in a few weeks as OR schedule allows. I spent 40 minutes in this encounter with greater than 50% of the time spent in coordination and counseling of her care.  Caroleen Hamman, MD [facility] General Surgeon

## 2016-09-24 NOTE — Progress Notes (Signed)
Patient states after last pain medication given she Feels like she cant take a deep breath, reassured Patient, placed on oxygen via nasal cannula at 4 liters And Dr. Kayleen Memos notified, may give duoneb treatment To see if it helps.  Patient is anxious at times still, Reassured patient and will continue to monitor.

## 2016-09-24 NOTE — Progress Notes (Signed)
15 minute call to floor and is going to approve room also.

## 2016-09-24 NOTE — Anesthesia Preprocedure Evaluation (Addendum)
Anesthesia Evaluation  Patient identified by MRN, date of birth, ID band Patient awake    Reviewed: Allergy & Precautions, H&P , NPO status , Patient's Chart, lab work & pertinent test results, reviewed documented beta blocker date and time   History of Anesthesia Complications Negative for: history of anesthetic complications  Airway Mallampati: III  TM Distance: >3 FB Neck ROM: full    Dental  (+) Caps, Missing, Chipped   Pulmonary neg pulmonary ROS,    Pulmonary exam normal        Cardiovascular Exercise Tolerance: Good hypertension, (-) angina+ CAD, + Cardiac Stents and +CHF  (-) Past MI and (-) CABG Normal cardiovascular exam(-) dysrhythmias + Valvular Problems/Murmurs      Neuro/Psych  Neuromuscular disease negative psych ROS   GI/Hepatic Neg liver ROS, hiatal hernia, GERD  ,  Endo/Other  diabetes, Type 2, Oral Hypoglycemic AgentsMorbid obesity  Renal/GU CRFRenal disease  negative genitourinary   Musculoskeletal   Abdominal   Peds  Hematology  (+) Blood dyscrasia, anemia ,   Anesthesia Other Findings Past Medical History: No date: Arthritis     Comment: osteoarthritis No date: CHF (congestive heart failure) (HCC) No date: Coronary artery disease No date: Diabetes mellitus without complication (HCC) No date: Diverticulosis No date: Esophagitis No date: GERD (gastroesophageal reflux disease) No date: High cholesterol No date: Hypercalcemia No date: Hypertension No date: Lumbar radiculopathy No date: Obesity No date: Osteopenia     Comment: Hips No date: Thyroid disease   Reproductive/Obstetrics negative OB ROS                            Anesthesia Physical  Anesthesia Plan  ASA: III  Anesthesia Plan: General   Post-op Pain Management:    Induction: Rapid sequence, Intravenous and Cricoid pressure planned  PONV Risk Score and Plan: 4 or greater and Ondansetron,  Dexamethasone, Midazolam, Scopolamine patch - Pre-op and Propofol infusion  Airway Management Planned: Oral ETT  Additional Equipment:   Intra-op Plan:   Post-operative Plan: Extubation in OR  Informed Consent: I have reviewed the patients History and Physical, chart, labs and discussed the procedure including the risks, benefits and alternatives for the proposed anesthesia with the patient or authorized representative who has indicated his/her understanding and acceptance.   Dental Advisory Given and Dental advisory given  Plan Discussed with: Anesthesiologist, CRNA and Surgeon  Anesthesia Plan Comments:         Anesthesia Quick Evaluation

## 2016-09-24 NOTE — Progress Notes (Signed)
Dr. Kayleen Memos in to talk with patient and evaluate status. Patient states she is feeling better now.

## 2016-09-24 NOTE — Anesthesia Procedure Notes (Signed)
Procedure Name: Intubation Performed by: Lance Muss Pre-anesthesia Checklist: Patient identified, Patient being monitored, Timeout performed, Emergency Drugs available and Suction available Patient Re-evaluated:Patient Re-evaluated prior to induction Oxygen Delivery Method: Circle system utilized Preoxygenation: Pre-oxygenation with 100% oxygen Induction Type: IV induction Ventilation: Mask ventilation without difficulty Laryngoscope Size: Mac and 3 Grade View: Grade II Tube type: Oral Tube size: 7.0 mm Number of attempts: 1 Airway Equipment and Method: Stylet Placement Confirmation: ETT inserted through vocal cords under direct vision,  positive ETCO2 and breath sounds checked- equal and bilateral Secured at: 21 cm Tube secured with: Tape Dental Injury: Teeth and Oropharynx as per pre-operative assessment  Difficulty Due To: Difficult Airway- due to anterior larynx

## 2016-09-24 NOTE — Anesthesia Postprocedure Evaluation (Signed)
Anesthesia Post Note  Patient: Erica Larsen  Procedure(s) Performed: Procedure(s) (LRB): LAPAROSCOPIC REPAIR OF HIATAL HERNIA WITH FUNDOPLICATION WITH MESH (BIO-A) (N/A)  Patient location during evaluation: PACU Anesthesia Type: General Level of consciousness: awake and alert and oriented Pain management: pain level controlled Vital Signs Assessment: post-procedure vital signs reviewed and stable Respiratory status: spontaneous breathing Cardiovascular status: blood pressure returned to baseline Anesthetic complications: no     Last Vitals:  Vitals:   09/24/16 1311 09/24/16 1353  BP: (!) 133/59 137/74  Pulse: 83 81  Resp: 16   Temp: (!) 35.8 C (!) 36.2 C    Last Pain:  Vitals:   09/24/16 1311  TempSrc: Oral  PainSc:                  Sabrine Patchen

## 2016-09-24 NOTE — Progress Notes (Signed)
From PACU s/p LAPAROSCOPIC REPAIR OF HIATAL HERNIA WITH FUNDOPLICATION WITH MESH .Alert and oriented. Surgical pain controlled with ketorlac.. Foley cath removed at 1720. Took clear liquid diet for dinner without problem. Abdomen obese,soft with hypoactive bowel sounds. Small incisions to abdomen with dermabond and no drainage.

## 2016-09-25 LAB — BASIC METABOLIC PANEL
Anion gap: 8 (ref 5–15)
BUN: 19 mg/dL (ref 6–20)
CHLORIDE: 107 mmol/L (ref 101–111)
CO2: 25 mmol/L (ref 22–32)
Calcium: 8.4 mg/dL — ABNORMAL LOW (ref 8.9–10.3)
Creatinine, Ser: 1.3 mg/dL — ABNORMAL HIGH (ref 0.44–1.00)
GFR calc Af Amer: 48 mL/min — ABNORMAL LOW (ref >60)
GFR calc non Af Amer: 41 mL/min — ABNORMAL LOW (ref >60)
GLUCOSE: 168 mg/dL — AB (ref 65–99)
POTASSIUM: 4 mmol/L (ref 3.5–5.1)
Sodium: 140 mmol/L (ref 135–145)

## 2016-09-25 LAB — CBC
HEMATOCRIT: 34.1 % — AB (ref 35.0–47.0)
Hemoglobin: 11.7 g/dL — ABNORMAL LOW (ref 12.0–16.0)
MCH: 32.6 pg (ref 26.0–34.0)
MCHC: 34.3 g/dL (ref 32.0–36.0)
MCV: 95.2 fL (ref 80.0–100.0)
Platelets: 145 10*3/uL — ABNORMAL LOW (ref 150–440)
RBC: 3.59 MIL/uL — ABNORMAL LOW (ref 3.80–5.20)
RDW: 14 % (ref 11.5–14.5)
WBC: 8 10*3/uL (ref 3.6–11.0)

## 2016-09-25 MED ORDER — HYDROCODONE-ACETAMINOPHEN 5-325 MG PO TABS: 1.0000 | ORAL_TABLET | Freq: Four times a day (QID) | ORAL | 0 refills | Status: DC | PRN

## 2016-09-25 NOTE — Discharge Summary (Signed)
Patient ID: Erica Larsen MRN: [id number] DOB/AGE: [birth year] 66 y.o.  Admit date: 09/24/2016 Discharge date: 09/25/2016   Discharge Diagnoses:  Active Problems:   Hiatal hernia   Procedures: Lap repair of Hiatal hernia with partial fundoplication  Hospital Course: 66 year old female underwent an uneventful laparoscopic hiatal hernia repair with mesh and a partial fundoplication. She had an uneventful postoperative course and was able to tolerate a clear liquid diet to a full liquid diet. At the time of discharge she was ambulating, no evidence of dysphagia, vital signs were stable and she was afebrile. Physical exam at discharge showed a 66 female no acute distress, obese. Abdomen: Soft, incisions well without evidence of infection or peritonitis. Extremities: No edema well perfused. Condition at discharge is stable. She'll be on a full liquid diet for 3 weeks and will have then a soft diet (Post Nissen) . Instructions handed to the pt personally    Disposition: 01-Home or Self Care  Discharge Instructions    Call MD for:  difficulty breathing, headache or visual disturbances    Complete by:  As directed    Call MD for:  extreme fatigue    Complete by:  As directed    Call MD for:  hives    Complete by:  As directed    Call MD for:  persistant dizziness or light-headedness    Complete by:  As directed    Call MD for:  persistant nausea and vomiting    Complete by:  As directed    Call MD for:  redness, tenderness, or signs of infection (pain, swelling, redness, odor or green/yellow discharge around incision site)    Complete by:  As directed    Call MD for:  severe uncontrolled pain    Complete by:  As directed    Call MD for:  temperature >100.4    Complete by:  As directed    Discharge instructions    Complete by:  As directed    Shower in the am. Pt to have full liquid diet for 4 weeks.   Increase activity slowly    Complete by:  As directed    Lifting restrictions     Complete by:  As directed    20 lbs x 6 wks     Allergies as of 09/25/2016      Reactions   Sulfa Antibiotics Anaphylaxis, Swelling   Latex Rash      Medication List    TAKE these medications   amLODipine 5 MG tablet Commonly known as:  NORVASC Take 5 mg by mouth every morning.   amLODipine 2.5 MG tablet Commonly known as:  NORVASC take 1 tablet by mouth once daily   aspirin EC 81 MG tablet Take 81 mg by mouth daily after breakfast.   atorvastatin 80 MG tablet Commonly known as:  LIPITOR Take 80 mg by mouth daily after breakfast.   clopidogrel 75 MG tablet Commonly known as:  PLAVIX Take 75 mg by mouth daily after breakfast.   furosemide 40 MG tablet Commonly known as:  LASIX take 1 tablet by mouth once daily What changed:  See the new instructions.   HYDROcodone-acetaminophen 5-325 MG tablet Commonly known as:  NORCO/VICODIN Take 1-2 tablets by mouth every 6 (six) hours as needed for moderate pain.   lansoprazole 30 MG capsule Commonly known as:  PREVACID take 1 capsule by mouth once daily AT 12 NOON What changed:  See the new instructions.   levothyroxine 100 MCG tablet Commonly  known as:  SYNTHROID, LEVOTHROID take 1 tablet by mouth once daily What changed:  See the new instructions.   metoprolol succinate 50 MG 24 hr tablet Commonly known as:  TOPROL-XL Take 50 mg by mouth daily after breakfast. Take with or immediately following a meal.   olmesartan 40 MG tablet Commonly known as:  BENICAR take 1 tablet by mouth once daily What changed:  See the new instructions.   raloxifene 60 MG tablet Commonly known as:  EVISTA take 1 tablet by mouth once daily What changed:  See the new instructions.   sitaGLIPtin 100 MG tablet Commonly known as:  JANUVIA Take 1 tablet (100 mg total) by mouth daily. What changed:  when to take this   SYSTANE ULTRA OP Place 1 drop into both eyes 2 (two) times daily.      Follow-up Information    Jules Husbands, MD.  Go on 10/04/2016.   Specialty:  General Surgery Why:  Dr. Dahlia Byes, Friday, August 17 at 9:30 a.m.  [phone] Contact information: [place] [address] [phone]            Caroleen Hamman, MD FACS

## 2016-09-26 ENCOUNTER — Telehealth: Payer: Self-pay

## 2016-09-26 NOTE — Telephone Encounter (Signed)
  Left message for patient to return call to office regarding post op questions.

## 2016-09-26 NOTE — Telephone Encounter (Signed)
Post-op call made to patient at this time. Spoke with Erica Larsen. Post-op interview questions below.  1. How are you feeling? Having some soreness  2. Is your pain controlled? Yes  3. What are you doing for the pain? Taking pain medication  4. Are you having any Nausea or Vomiting? None  5. Are you having any Fever or Chills? None  6. Are you having any Constipation or Diarrhea? Has yet to have a bowel movement but has been passing gas  7. Is there any Swelling or Bruising you are concerned about? None  8. Do you have any questions or concerns at this time? None   Discussion: Reminded patient of post op visit on 8/17 at 9:45

## 2016-09-27 ENCOUNTER — Other Ambulatory Visit: Payer: Self-pay | Admitting: Unknown Physician Specialty

## 2016-10-03 ENCOUNTER — Other Ambulatory Visit: Payer: Self-pay | Admitting: Family Medicine

## 2016-10-04 ENCOUNTER — Encounter: Payer: Self-pay | Admitting: Surgery

## 2016-10-04 ENCOUNTER — Ambulatory Visit (INDEPENDENT_AMBULATORY_CARE_PROVIDER_SITE_OTHER): Payer: PRIVATE HEALTH INSURANCE | Admitting: Surgery

## 2016-10-04 VITALS — BP 170/85 | HR 69 | Temp 98.3°F | Ht 62.0 in | Wt 201.2 lb

## 2016-10-04 DIAGNOSIS — Z09 Encounter for follow-up examination after completed treatment for conditions other than malignant neoplasm: Secondary | ICD-10-CM

## 2016-10-04 NOTE — Progress Notes (Signed)
S/P lap HH repair w BioA Doing very well. He is tolerating full liquids well. She is eager to try a soft diet No complaints No dysphagia, significant improvement in swallowing after surgery  PE NAD  Abd: Incisions healing well without evidence of infection. No peritonitis.  A/p Doing well Soft diet RTC 3 weeks

## 2016-10-04 NOTE — Patient Instructions (Signed)
You may try soft foods at this time. Pudding scrambled eggs, yogurt are some examples however you can refer to your instructions that were provided to you at your discharge from the hospital. Please see your follow up appointment listed below.  No lifting over 20 pounds.

## 2016-10-07 ENCOUNTER — Other Ambulatory Visit: Payer: Self-pay | Admitting: Unknown Physician Specialty

## 2016-10-14 ENCOUNTER — Other Ambulatory Visit: Payer: Self-pay | Admitting: Unknown Physician Specialty

## 2016-10-23 ENCOUNTER — Encounter: Payer: PRIVATE HEALTH INSURANCE | Admitting: Surgery

## 2016-11-13 ENCOUNTER — Other Ambulatory Visit: Payer: Self-pay | Admitting: Family Medicine

## 2016-11-13 NOTE — Telephone Encounter (Signed)
Your patient 

## 2016-12-11 ENCOUNTER — Other Ambulatory Visit: Payer: Self-pay | Admitting: Unknown Physician Specialty

## 2016-12-11 NOTE — Telephone Encounter (Deleted)
Copied from [place] #[id number]. Topic: General - Other >> Dec 11, 2016 12:41 PM Aurelio Brash B wrote: Reason for CRM: Patient has called again and would a Rx for a new meter and test strips that her insurance will cover. Her pharmacy is [facility] on [address].  She stated her insurance co told her that her Dr would have to decide which meter and strips to use.

## 2016-12-11 NOTE — Telephone Encounter (Signed)
Copied from [place]. Topic: Quick Communication - See Telephone Encounter >> Dec 11, 2016 12:18 PM Robina Ade, Helene Kelp D wrote: CRM for notification. See Telephone encounter for: 12/11/16. Patient called and would a Rx for a new meter and test strips that her insurance will cover. Her pharmacy is [facility] on [address].

## 2016-12-11 NOTE — Telephone Encounter (Signed)
Pt called and asked her to phone her insurance company to see which meter is covered by her insurance company. Pt is asking for RX for new meter and strips. Asked pt to call us back with that information.

## 2016-12-13 ENCOUNTER — Other Ambulatory Visit: Payer: Self-pay

## 2016-12-13 MED ORDER — FUROSEMIDE 40 MG PO TABS: 40.0000 mg | ORAL_TABLET | Freq: Every day | ORAL | 3 refills | Status: DC

## 2016-12-13 NOTE — Telephone Encounter (Signed)
Patient last seen 08/2016 and has f/up schedule in 02/2017.

## 2016-12-16 ENCOUNTER — Telehealth: Payer: Self-pay

## 2016-12-16 NOTE — Telephone Encounter (Signed)
Copied from [place] #[id number]. Topic: Inquiry >> Dec 16, 2016 11:15 AM Pricilla Handler wrote: Reason for CRM: Patient called stating that she needs a prescription for a New Glucose Meter and Testing Strips ASAP. Patient stated that her current glucose meter strips have been discontinued. Patient stated that she called twice on Thursday, but still waiting to hear back from somebody. Please call this patient today at [phone]. Thank You Very Much!!!  Called and spoke to patient. I apologized for this taking a long time but explained that I was under the impression that we were waiting on her to speak with her insurance company about what brand was covered. Patient states that her insurance company told her that it was up to [place] as to what brand we wrote for. Form filled out, signed by provider, and faxed to [facility] per patient request.

## 2016-12-27 ENCOUNTER — Other Ambulatory Visit: Payer: Self-pay

## 2016-12-27 MED ORDER — LEVOTHYROXINE SODIUM 100 MCG PO TABS: 100.0000 ug | ORAL_TABLET | Freq: Every day | ORAL | 0 refills | Status: DC

## 2016-12-27 NOTE — Telephone Encounter (Signed)
Patient last seen 09/11/16 and has f/up 03/11/17.

## 2017-01-07 ENCOUNTER — Other Ambulatory Visit: Payer: Self-pay | Admitting: Family Medicine

## 2017-01-08 ENCOUNTER — Other Ambulatory Visit: Payer: Self-pay

## 2017-01-08 MED ORDER — LANSOPRAZOLE 30 MG PO CPDR: DELAYED_RELEASE_CAPSULE | ORAL | 3 refills | Status: DC

## 2017-01-08 NOTE — Telephone Encounter (Signed)
Patient last seen 09/11/16 and has f/up 03/11/17.

## 2017-01-13 ENCOUNTER — Other Ambulatory Visit: Payer: Self-pay | Admitting: Family Medicine

## 2017-01-13 ENCOUNTER — Inpatient Hospital Stay: Payer: PRIVATE HEALTH INSURANCE | Attending: Internal Medicine

## 2017-01-13 DIAGNOSIS — D500 Iron deficiency anemia secondary to blood loss (chronic): Secondary | ICD-10-CM | POA: Diagnosis present

## 2017-01-13 DIAGNOSIS — D649 Anemia, unspecified: Secondary | ICD-10-CM

## 2017-01-13 LAB — CBC WITH DIFFERENTIAL/PLATELET
BASOS ABS: 0 10*3/uL (ref 0–0.1)
BASOS PCT: 1 %
EOS PCT: 2 %
Eosinophils Absolute: 0.1 10*3/uL (ref 0–0.7)
HEMATOCRIT: 38.3 % (ref 35.0–47.0)
Hemoglobin: 12.8 g/dL (ref 12.0–16.0)
LYMPHS PCT: 28 %
Lymphs Abs: 1.6 10*3/uL (ref 1.0–3.6)
MCH: 32.1 pg (ref 26.0–34.0)
MCHC: 33.3 g/dL (ref 32.0–36.0)
MCV: 96.3 fL (ref 80.0–100.0)
MONO ABS: 0.6 10*3/uL (ref 0.2–0.9)
Monocytes Relative: 11 %
NEUTROS ABS: 3.2 10*3/uL (ref 1.4–6.5)
Neutrophils Relative %: 58 %
Platelets: 158 10*3/uL (ref 150–440)
RBC: 3.97 MIL/uL (ref 3.80–5.20)
RDW: 15.9 % — AB (ref 11.5–14.5)
WBC: 5.6 10*3/uL (ref 3.6–11.0)

## 2017-01-13 LAB — BASIC METABOLIC PANEL
ANION GAP: 12 (ref 5–15)
BUN: 18 mg/dL (ref 6–20)
CALCIUM: 9.2 mg/dL (ref 8.9–10.3)
CO2: 25 mmol/L (ref 22–32)
Chloride: 100 mmol/L — ABNORMAL LOW (ref 101–111)
Creatinine, Ser: 1.04 mg/dL — ABNORMAL HIGH (ref 0.44–1.00)
GFR calc non Af Amer: 54 mL/min — ABNORMAL LOW (ref >60)
Glucose, Bld: 136 mg/dL — ABNORMAL HIGH (ref 65–99)
POTASSIUM: 4.6 mmol/L (ref 3.5–5.1)
Sodium: 137 mmol/L (ref 135–145)

## 2017-01-13 LAB — IRON AND TIBC
Iron: 81 ug/dL (ref 28–170)
SATURATION RATIOS: 20 % (ref 10.4–31.8)
TIBC: 409 ug/dL (ref 250–450)
UIBC: 328 ug/dL

## 2017-01-13 LAB — FERRITIN: Ferritin: 92 ng/mL (ref 11–307)

## 2017-01-13 NOTE — Telephone Encounter (Signed)
Your patient 

## 2017-01-13 NOTE — Telephone Encounter (Signed)
Pt request Evista; Can this be refilled? Spoke with Santiago Glad and she will route to [employer]

## 2017-01-15 ENCOUNTER — Inpatient Hospital Stay: Payer: PRIVATE HEALTH INSURANCE

## 2017-01-15 ENCOUNTER — Inpatient Hospital Stay: Payer: PRIVATE HEALTH INSURANCE | Admitting: Internal Medicine

## 2017-01-23 ENCOUNTER — Other Ambulatory Visit: Payer: Self-pay

## 2017-01-23 ENCOUNTER — Inpatient Hospital Stay: Payer: PRIVATE HEALTH INSURANCE | Attending: Internal Medicine | Admitting: Internal Medicine

## 2017-01-23 ENCOUNTER — Inpatient Hospital Stay: Payer: PRIVATE HEALTH INSURANCE

## 2017-01-23 VITALS — BP 174/84 | HR 73 | Temp 98.7°F | Resp 18 | Ht 62.0 in | Wt 202.0 lb

## 2017-01-23 DIAGNOSIS — I509 Heart failure, unspecified: Secondary | ICD-10-CM | POA: Diagnosis not present

## 2017-01-23 DIAGNOSIS — N183 Chronic kidney disease, stage 3 (moderate): Secondary | ICD-10-CM | POA: Diagnosis not present

## 2017-01-23 DIAGNOSIS — K5790 Diverticulosis of intestine, part unspecified, without perforation or abscess without bleeding: Secondary | ICD-10-CM | POA: Insufficient documentation

## 2017-01-23 DIAGNOSIS — I10 Essential (primary) hypertension: Secondary | ICD-10-CM | POA: Insufficient documentation

## 2017-01-23 DIAGNOSIS — I2510 Atherosclerotic heart disease of native coronary artery without angina pectoris: Secondary | ICD-10-CM | POA: Insufficient documentation

## 2017-01-23 DIAGNOSIS — M5416 Radiculopathy, lumbar region: Secondary | ICD-10-CM

## 2017-01-23 DIAGNOSIS — E669 Obesity, unspecified: Secondary | ICD-10-CM | POA: Diagnosis not present

## 2017-01-23 DIAGNOSIS — E119 Type 2 diabetes mellitus without complications: Secondary | ICD-10-CM | POA: Diagnosis not present

## 2017-01-23 DIAGNOSIS — Z8619 Personal history of other infectious and parasitic diseases: Secondary | ICD-10-CM | POA: Insufficient documentation

## 2017-01-23 DIAGNOSIS — M8580 Other specified disorders of bone density and structure, unspecified site: Secondary | ICD-10-CM | POA: Diagnosis not present

## 2017-01-23 DIAGNOSIS — M129 Arthropathy, unspecified: Secondary | ICD-10-CM | POA: Diagnosis not present

## 2017-01-23 DIAGNOSIS — E039 Hypothyroidism, unspecified: Secondary | ICD-10-CM | POA: Diagnosis not present

## 2017-01-23 DIAGNOSIS — E7800 Pure hypercholesterolemia, unspecified: Secondary | ICD-10-CM | POA: Insufficient documentation

## 2017-01-23 DIAGNOSIS — K219 Gastro-esophageal reflux disease without esophagitis: Secondary | ICD-10-CM | POA: Insufficient documentation

## 2017-01-23 DIAGNOSIS — I129 Hypertensive chronic kidney disease with stage 1 through stage 4 chronic kidney disease, or unspecified chronic kidney disease: Secondary | ICD-10-CM | POA: Diagnosis not present

## 2017-01-23 DIAGNOSIS — K449 Diaphragmatic hernia without obstruction or gangrene: Secondary | ICD-10-CM | POA: Insufficient documentation

## 2017-01-23 DIAGNOSIS — D500 Iron deficiency anemia secondary to blood loss (chronic): Secondary | ICD-10-CM | POA: Insufficient documentation

## 2017-01-23 MED ORDER — FERROUS FUM-IRON POLYSACCH 162-115.2 MG PO CAPS: 1.0000 | ORAL_CAPSULE | Freq: Every day | ORAL | 3 refills | Status: DC

## 2017-01-23 NOTE — Progress Notes (Signed)
[place] NOTE  Patient Care Team: Kathrine Haddock, NP as PCP - General (Nurse Practitioner) Dionisio David, MD as Consulting Physician (Cardiology) Cammie Sickle, MD as Consulting Physician (Internal Medicine)  CHIEF COMPLAINTS/PURPOSE OF CONSULTATION:   # 2014- Iron def Anemia-  [Sep 2014 EGD/COLO- NEG/ferritin 6; Dr.Wohl]. 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   # Hx CHF/CAD- asprin-plavix; Sep 2018- s/p hiatal hernia-fundoplication [Dr.Pabon]  HISTORY OF PRESENTING ILLNESS:  Erica Larsen 66 y.o.  female patient with above history of iron deficiency anemia of unclear etiology is here status post IV iron in November 2016 is here for follow-up.  In the interim patient had  In the interim patient had repair of her large hiatal hernia.  Postoperative recovery normal.  Patient's energy levels are significantly improved. She continues to deny any blood in stools or black stools.  However the swelling is currently improved.   ROS: A complete 10 point review of system is done which is negative except mentioned above in history of present illness  MEDICAL HISTORY:  Past Medical History:  Diagnosis Date  . Anemia 2017  . Arthritis    osteoarthritis  . CHF (congestive heart failure) ([place])   . Chronic kidney disease 2017   stage 3  . Coronary artery disease   . Diabetes mellitus without complication ([place])   . Diverticulosis   . Esophagitis   . GERD (gastroesophageal reflux disease)   . Hiatal hernia   . High cholesterol   . Hypercalcemia   . Hypertension   . Hypothyroidism   . Lumbar radiculopathy   . Obesity   . Osteopenia    Hips  . Thyroid disease     SURGICAL HISTORY: Past Surgical History:  Procedure Laterality Date  . CATARACT EXTRACTION Bilateral    May - June 2018  . COLONOSCOPY WITH PROPOFOL N/A 08/01/2015    Procedure: COLONOSCOPY WITH PROPOFOL;  Surgeon: Lucilla Lame, MD;  Location: [facility] ENDOSCOPY;  Service: Endoscopy;  Laterality: N/A;  . CORONARY ANGIOPLASTY WITH STENT PLACEMENT    . ESOPHAGOGASTRODUODENOSCOPY (EGD) WITH PROPOFOL N/A 05/14/2016   Procedure: ESOPHAGOGASTRODUODENOSCOPY (EGD) WITH PROPOFOL;  Surgeon: Lucilla Lame, MD;  Location: [facility] ENDOSCOPY;  Service: Endoscopy;  Laterality: N/A;  . GIVENS CAPSULE STUDY N/A 06/01/2015   Procedure: GIVENS CAPSULE STUDY;  Surgeon: Lucilla Lame, MD;  Location: [facility] ENDOSCOPY;  Service: Endoscopy;  Laterality: N/A;  . HIATAL HERNIA REPAIR N/A 09/24/2016   Procedure: LAPAROSCOPIC REPAIR OF HIATAL HERNIA WITH FUNDOPLICATION WITH MESH (BIO-A);  Surgeon: Jules Husbands, MD;  Location: [facility] ORS;  Service: General;  Laterality: N/A;  Lithotomy position w arms tucked.   . pre cancer removed     left forearm    SOCIAL HISTORY: Social History   Socioeconomic History  . Marital status: Married    Spouse name: Not on file  . Number of children: Not on file  . Years of education: Not on file  . Highest education level: Not on file  Social Needs  . Financial resource strain: Not on file  . Food insecurity - worry: Not on file  . Food insecurity - inability: Not on file  . Transportation needs - medical: Not on file  . Transportation needs - non-medical: Not on file  Occupational History  . Not on file  Tobacco Use  . Smoking status: Never Smoker  . Smokeless tobacco: Never Used  Substance and Sexual Activity  .  Alcohol use: No  . Drug use: No  . Sexual activity: Yes    Birth control/protection: Post-menopausal  Other Topics Concern  . Not on file  Social History Narrative  . Not on file    FAMILY HISTORY: Family History  Problem Relation Age of Onset  . Heart disease Mother   . Kidney failure Mother   . Hypertension Mother   . Diabetes Mother   . Heart attack Father   . Hypertension Father   . Thyroid disease Father   . Hypertension  Sister   . Diabetes Sister   . Thyroid disease Daughter   . Thyroid disease Daughter     ALLERGIES:  is allergic to sulfa antibiotics and latex.  MEDICATIONS:  Current Outpatient Medications  Medication Sig Dispense Refill  . amLODipine (NORVASC) 5 MG tablet Take 5 mg by mouth every morning.    [name] aspirin EC 81 MG tablet Take 81 mg by mouth daily after breakfast.    . atorvastatin (LIPITOR) 80 MG tablet Take 80 mg by mouth daily after breakfast.   0  . clopidogrel (PLAVIX) 75 MG tablet Take 75 mg by mouth daily after breakfast.     . furosemide (LASIX) 40 MG tablet Take 1 tablet (40 mg total) by mouth daily. 30 tablet 3  . JANUVIA 100 MG tablet take 1 tablet by mouth once daily 30 tablet 3  . lansoprazole (PREVACID) 30 MG capsule take 1 capsule by mouth once daily AT NOON 90 capsule 3  . levothyroxine (SYNTHROID, LEVOTHROID) 100 MCG tablet Take 1 tablet (100 mcg total) daily by mouth. 90 tablet 0  . metoprolol succinate (TOPROL-XL) 50 MG 24 hr tablet Take 50 mg by mouth daily after breakfast. Take with or immediately following a meal.    . olmesartan (BENICAR) 40 MG tablet take 1 tablet by mouth once daily 30 tablet 5  . Polyethyl Glycol-Propyl Glycol (SYSTANE ULTRA OP) Place 1 drop into both eyes 2 (two) times daily.     . raloxifene (EVISTA) 60 MG tablet take 1 tablet by mouth once daily 30 tablet 5  . ferrous fumarate-iron polysaccharide complex (TANDEM) 162-115.2 MG CAPS capsule Take 1 capsule by mouth daily with breakfast. 90 capsule 3   No current facility-administered medications for this visit.       [name]  PHYSICAL EXAMINATION: ECOG PERFORMANCE STATUS: 1 - Symptomatic but completely ambulatory  Vitals:   01/23/17 0953  BP: (!) 174/84  Pulse: 73  Resp: 18  Temp: 98.7 F (37.1 C)   Filed Weights   01/23/17 0953  Weight: 202 lb (91.6 kg)    GENERAL: Well-nourished well-developed; Alert, no distress and comfortable. Obese. Puffy face. EYES: no pallor/ no icterus.   OROPHARYNX: no thrush or ulceration; poor dentition. NECK: supple, no masses felt LYMPH: no palpable lymphadenopathy in the cervical, axillary or inguinal regions LUNGS: clear to auscultation and No wheeze or crackles HEART/CVS: regular rate & rhythm and no murmurs; 1+ bilateral lower extremity edema. ABDOMEN: abdomen soft, non-tender and normal bowel sounds Musculoskeletal:no cyanosis of digits and no clubbing  PSYCH: alert & oriented x 3 with fluent speech NEURO: no focal motor/sensory deficits SKIN: no rashes or significant lesions   LABORATORY DATA:  I have reviewed the data as listed Lab Results  Component Value Date   WBC 5.6 01/13/2017   HGB 12.8 01/13/2017   HCT 38.3 01/13/2017   MCV 96.3 01/13/2017   PLT 158 01/13/2017   Recent Labs    05/31/16 0936  09/11/16 0919 09/24/16 1334 09/25/16 0332 01/13/17 1125  NA 139  --  144  --  140 137  K 4.4  --  4.5  --  4.0 4.6  CL 96  --  103  --  107 100*  CO2 27  --  24  --  25 25  GLUCOSE 147*  --  101*  --  168* 136*  BUN 17  --  14  --  19 18  CREATININE 1.20*   < > 1.13* 1.41* 1.30* 1.04*  CALCIUM 9.7  --  9.4  --  8.4* 9.2  GFRNONAA 47*  --  50* 37* 41* 54*  GFRAA 54*  --  58* 43* 48* >60  PROT 7.3  --  7.0  --   --   --   ALBUMIN 4.6  --  4.3  --   --   --   AST 45*  --  40  --   --   --   ALT 44*  --  36*  --   --   --   ALKPHOS 83  --  71  --   --   --   BILITOT 0.5  --  0.5  --   --   --    < > = values in this interval not displayed.     ASSESSMENT & PLAN:  Iron deficiency anemia due to chronic blood loss # Iron deficiency anemia- unclear etiology; question GI blood loss.  Question large hiatal hernia status post recent fundoplication.   # Today hemoglobin is 12.8 ferritin is 92; iron sat- 20%; I would not recommend any IV iron today.  Recommend p.o. iron.  Prescription for tandem pill sent.  # Recommend follow-up in 6 months; he'll have labs done few days prior; IV Feraheme if needed.      Cammie Sickle, MD 01/23/2017 10:11 AM

## 2017-01-23 NOTE — Assessment & Plan Note (Addendum)
#   Iron deficiency anemia- unclear etiology; question GI blood loss.  Question large hiatal hernia status post recent fundoplication.   # Today hemoglobin is 12.8 ferritin is 92; iron sat- 20%; I would not recommend any IV iron today.  Recommend p.o. iron.  Prescription for tandem pill sent.  # Recommend follow-up in 6 months; he'll have labs done few days prior; IV Feraheme if needed.

## 2017-02-06 DIAGNOSIS — L298 Other pruritus: Secondary | ICD-10-CM | POA: Diagnosis not present

## 2017-02-06 DIAGNOSIS — L853 Xerosis cutis: Secondary | ICD-10-CM | POA: Diagnosis not present

## 2017-02-06 DIAGNOSIS — X32XXXA Exposure to sunlight, initial encounter: Secondary | ICD-10-CM | POA: Diagnosis not present

## 2017-02-06 DIAGNOSIS — L570 Actinic keratosis: Secondary | ICD-10-CM | POA: Diagnosis not present

## 2017-02-13 ENCOUNTER — Encounter: Payer: Self-pay | Admitting: Unknown Physician Specialty

## 2017-02-19 ENCOUNTER — Other Ambulatory Visit: Payer: Self-pay

## 2017-02-19 MED ORDER — SITAGLIPTIN PHOSPHATE 100 MG PO TABS: 100.0000 mg | ORAL_TABLET | Freq: Every day | ORAL | 3 refills | Status: DC

## 2017-02-19 NOTE — Telephone Encounter (Signed)
Patient last seen 08/2016 and has f/up 02/2017.

## 2017-03-11 ENCOUNTER — Ambulatory Visit: Payer: Medicare Other | Admitting: Unknown Physician Specialty

## 2017-03-19 ENCOUNTER — Ambulatory Visit: Payer: Medicare Other | Admitting: Unknown Physician Specialty

## 2017-03-28 ENCOUNTER — Other Ambulatory Visit: Payer: Self-pay

## 2017-03-28 MED ORDER — LEVOTHYROXINE SODIUM 100 MCG PO TABS
100.0000 ug | ORAL_TABLET | Freq: Every day | ORAL | 0 refills | Status: DC
Start: 2017-03-28 — End: 2017-06-23

## 2017-03-28 NOTE — Telephone Encounter (Signed)
Patient has f/up scheduled for 03/31/17.

## 2017-03-31 ENCOUNTER — Ambulatory Visit (INDEPENDENT_AMBULATORY_CARE_PROVIDER_SITE_OTHER): Payer: PRIVATE HEALTH INSURANCE | Admitting: Unknown Physician Specialty

## 2017-03-31 ENCOUNTER — Encounter: Payer: Self-pay | Admitting: Unknown Physician Specialty

## 2017-03-31 VITALS — BP 141/85 | HR 66 | Temp 98.5°F | Wt 206.6 lb

## 2017-03-31 DIAGNOSIS — N183 Chronic kidney disease, stage 3 (moderate): Secondary | ICD-10-CM | POA: Diagnosis not present

## 2017-03-31 DIAGNOSIS — E7800 Pure hypercholesterolemia, unspecified: Secondary | ICD-10-CM | POA: Diagnosis not present

## 2017-03-31 DIAGNOSIS — E1122 Type 2 diabetes mellitus with diabetic chronic kidney disease: Secondary | ICD-10-CM | POA: Diagnosis not present

## 2017-03-31 DIAGNOSIS — I10 Essential (primary) hypertension: Secondary | ICD-10-CM | POA: Diagnosis not present

## 2017-03-31 LAB — BAYER DCA HB A1C WAIVED: HB A1C: 6.6 % (ref <7.0)

## 2017-03-31 NOTE — Progress Notes (Signed)
   BP (!) 141/85 (BP Location: Left Arm, Cuff Size: Large)   Pulse 66   Temp 98.5 F (36.9 C) (Oral)   Wt 206 lb 9.6 oz (93.7 kg)   SpO2 99%   BMI 37.79 kg/m    Subjective:    Patient ID: Erica Larsen, female    DOB: Jun 29, 1950, 67 y.o.   MRN: [id number]  HPI: Erica Larsen is a 67 y.o. female  Chief Complaint  Patient presents with  . Diabetes  . Hypertension  . Hyperlipidemia   Diabetes: Using medications without difficulties No hypoglycemic episodes No hyperglycemic episodes Feet problems: none Blood Sugars averaging:127 this AM eye exam within last year Last Hgb A1C:6.2  Hypertension  Using medications without difficulty.  Also going to the heart failure clinic Average home BPs Not checking   Using medication without problems or lightheadedness No chest pain with exertion or shortness of breath No Edema  Elevated Cholesterol Using medications without problems No Muscle aches  Diet: Exercise:"hardly no exercise"   Relevant past medical, surgical, family and social history reviewed and updated as indicated. Interim medical history since our last visit reviewed. Allergies and medications reviewed and updated.  Review of Systems  Per HPI unless specifically indicated above     Objective:    BP (!) 141/85 (BP Location: Left Arm, Cuff Size: Large)   Pulse 66   Temp 98.5 F (36.9 C) (Oral)   Wt 206 lb 9.6 oz (93.7 kg)   SpO2 99%   BMI 37.79 kg/m   Wt Readings from Last 3 Encounters:  03/31/17 206 lb 9.6 oz (93.7 kg)  01/23/17 202 lb (91.6 kg)  10/04/16 201 lb 3.2 oz (91.3 kg)    Physical Exam  Constitutional: She is oriented to person, place, and time. She appears well-developed and well-nourished. No distress.  HENT:  Head: Normocephalic and atraumatic.  Eyes: Conjunctivae and lids are normal. Right eye exhibits no discharge. Left eye exhibits no discharge. No scleral icterus.  Neck: Normal range of motion. Neck supple. No JVD present. Carotid  bruit is not present.  Cardiovascular: Normal rate, regular rhythm and normal heart sounds.  Pulmonary/Chest: Effort normal and breath sounds normal.  Abdominal: Normal appearance. There is no splenomegaly or hepatomegaly.  Musculoskeletal: Normal range of motion.  Neurological: She is alert and oriented to person, place, and time.  Skin: Skin is warm, dry and intact. No rash noted. No pallor.  Psychiatric: She has a normal mood and affect. Her behavior is normal. Judgment and thought content normal.     Assessment & Plan:   Problem List Items Addressed This Visit      Unprioritized   Essential hypertension    Not quite to goal.  However OK with heart failure clinic at this time.  Stable, continue present medications.        Relevant Orders   Bayer DCA Hb A1c Waived   Hyperlipemia    Stable, continue present medications.        Relevant Orders   Lipid Panel w/o Chol/HDL Ratio   Type 2 diabetes mellitus with stage 3 chronic kidney disease, without long-term current use of insulin (HCC) - Primary    Hgb A1C is 6.7. Continue present treatment.  Encouraged to exercise          Follow up plan: Return in about 6 months (around 09/28/2017) for 6 months and for PE.

## 2017-03-31 NOTE — Assessment & Plan Note (Addendum)
Not quite to goal.  However OK with heart failure clinic at this time.  Stable, continue present medications.

## 2017-03-31 NOTE — Assessment & Plan Note (Signed)
Hgb A1C is 6.7. Continue present treatment.  Encouraged to exercise

## 2017-03-31 NOTE — Assessment & Plan Note (Signed)
Stable, continue present medications.   

## 2017-04-01 DIAGNOSIS — I2510 Atherosclerotic heart disease of native coronary artery without angina pectoris: Secondary | ICD-10-CM | POA: Diagnosis not present

## 2017-04-01 DIAGNOSIS — E782 Mixed hyperlipidemia: Secondary | ICD-10-CM | POA: Diagnosis not present

## 2017-04-01 DIAGNOSIS — I10 Essential (primary) hypertension: Secondary | ICD-10-CM | POA: Diagnosis not present

## 2017-04-01 DIAGNOSIS — I340 Nonrheumatic mitral (valve) insufficiency: Secondary | ICD-10-CM | POA: Diagnosis not present

## 2017-04-01 LAB — LIPID PANEL W/O CHOL/HDL RATIO
CHOLESTEROL TOTAL: 152 mg/dL (ref 100–199)
HDL: 82 mg/dL (ref >39)
LDL CALC: 56 mg/dL (ref 0–99)
Triglycerides: 70 mg/dL (ref 0–149)
VLDL Cholesterol Cal: 14 mg/dL (ref 5–40)

## 2017-04-11 ENCOUNTER — Other Ambulatory Visit: Payer: Self-pay | Admitting: Unknown Physician Specialty

## 2017-05-12 ENCOUNTER — Other Ambulatory Visit: Payer: Self-pay | Admitting: Unknown Physician Specialty

## 2017-06-11 ENCOUNTER — Other Ambulatory Visit: Payer: Self-pay | Admitting: Family Medicine

## 2017-06-23 ENCOUNTER — Other Ambulatory Visit: Payer: Self-pay | Admitting: Unknown Physician Specialty

## 2017-06-23 DIAGNOSIS — E039 Hypothyroidism, unspecified: Secondary | ICD-10-CM

## 2017-06-23 NOTE — Telephone Encounter (Signed)
Please ask her to come in and get a thyroid test

## 2017-07-01 DIAGNOSIS — Z9861 Coronary angioplasty status: Secondary | ICD-10-CM | POA: Diagnosis not present

## 2017-07-01 DIAGNOSIS — I10 Essential (primary) hypertension: Secondary | ICD-10-CM | POA: Diagnosis not present

## 2017-07-01 DIAGNOSIS — E782 Mixed hyperlipidemia: Secondary | ICD-10-CM | POA: Diagnosis not present

## 2017-07-01 DIAGNOSIS — I340 Nonrheumatic mitral (valve) insufficiency: Secondary | ICD-10-CM | POA: Diagnosis not present

## 2017-07-01 DIAGNOSIS — I2510 Atherosclerotic heart disease of native coronary artery without angina pectoris: Secondary | ICD-10-CM | POA: Diagnosis not present

## 2017-07-11 ENCOUNTER — Other Ambulatory Visit: Payer: Self-pay | Admitting: Family Medicine

## 2017-07-11 NOTE — Telephone Encounter (Signed)
Furosemide refill Last OV: 03/31/17 Last Refill:06/11/17 #30 tabs no RF Pharmacy:[facility] [address] PCP: Dr Jeananne Rama

## 2017-07-15 NOTE — Telephone Encounter (Signed)
Your patient 

## 2017-07-22 ENCOUNTER — Other Ambulatory Visit: Payer: PRIVATE HEALTH INSURANCE

## 2017-07-23 ENCOUNTER — Other Ambulatory Visit: Payer: Self-pay | Admitting: Unknown Physician Specialty

## 2017-07-24 ENCOUNTER — Ambulatory Visit: Payer: PRIVATE HEALTH INSURANCE | Admitting: Internal Medicine

## 2017-07-24 ENCOUNTER — Ambulatory Visit: Payer: PRIVATE HEALTH INSURANCE

## 2017-08-13 ENCOUNTER — Other Ambulatory Visit: Payer: Self-pay | Admitting: Unknown Physician Specialty

## 2017-08-15 NOTE — Telephone Encounter (Signed)
Furosemide refill Last Refill:07/15/17 # 30 No RF Last OV: 03/31/17 PCP: Kathrine Haddock NP Pharmacy:[facility] [address]

## 2017-08-25 ENCOUNTER — Other Ambulatory Visit: Payer: Self-pay | Admitting: Unknown Physician Specialty

## 2017-08-26 NOTE — Telephone Encounter (Signed)
raloxifene refill Last Refill:07/24/17 # 30 tab No RF Last OV: cannot find OV where drug is addressed. Pt has f/u 09/29/17 PCP: Kathrine Haddock NP Pharmacy:[facility] [address]  Januvia refill Last Refill:07/24/17 # 30 Last OV: 03/31/17. Pt has f/u 09/29/17.

## 2017-09-01 ENCOUNTER — Other Ambulatory Visit: Payer: Self-pay

## 2017-09-01 DIAGNOSIS — D500 Iron deficiency anemia secondary to blood loss (chronic): Secondary | ICD-10-CM

## 2017-09-01 MED ORDER — FERROUS FUM-IRON POLYSACCH 162-115.2 MG PO CAPS: 1.0000 | ORAL_CAPSULE | Freq: Every day | ORAL | 3 refills | Status: DC

## 2017-09-03 ENCOUNTER — Inpatient Hospital Stay: Payer: PRIVATE HEALTH INSURANCE | Attending: Internal Medicine

## 2017-09-03 ENCOUNTER — Encounter: Payer: Self-pay | Admitting: Unknown Physician Specialty

## 2017-09-03 DIAGNOSIS — D500 Iron deficiency anemia secondary to blood loss (chronic): Secondary | ICD-10-CM | POA: Insufficient documentation

## 2017-09-03 LAB — CBC WITH DIFFERENTIAL/PLATELET
Basophils Absolute: 0 10*3/uL (ref 0–0.1)
Basophils Relative: 0 %
EOS ABS: 0.4 10*3/uL (ref 0–0.7)
EOS PCT: 5 %
HCT: 36.8 % (ref 35.0–47.0)
Hemoglobin: 12.5 g/dL (ref 12.0–16.0)
LYMPHS ABS: 2.1 10*3/uL (ref 1.0–3.6)
Lymphocytes Relative: 28 %
MCH: 33.9 pg (ref 26.0–34.0)
MCHC: 34 g/dL (ref 32.0–36.0)
MCV: 99.6 fL (ref 80.0–100.0)
MONOS PCT: 11 %
Monocytes Absolute: 0.8 10*3/uL (ref 0.2–0.9)
Neutro Abs: 4.2 10*3/uL (ref 1.4–6.5)
Neutrophils Relative %: 56 %
PLATELETS: 156 10*3/uL (ref 150–440)
RBC: 3.7 MIL/uL — ABNORMAL LOW (ref 3.80–5.20)
RDW: 13.3 % (ref 11.5–14.5)
WBC: 7.5 10*3/uL (ref 3.6–11.0)

## 2017-09-03 LAB — IRON AND TIBC
Iron: 100 ug/dL (ref 28–170)
Saturation Ratios: 25 % (ref 10.4–31.8)
TIBC: 397 ug/dL (ref 250–450)
UIBC: 297 ug/dL

## 2017-09-03 LAB — FERRITIN: FERRITIN: 155 ng/mL (ref 11–307)

## 2017-09-03 LAB — COMPREHENSIVE METABOLIC PANEL
ALT: 30 U/L (ref 0–44)
AST: 45 U/L — AB (ref 15–41)
Albumin: 4.7 g/dL (ref 3.5–5.0)
Alkaline Phosphatase: 83 U/L (ref 38–126)
Anion gap: 12 (ref 5–15)
BUN: 30 mg/dL — AB (ref 8–23)
CHLORIDE: 105 mmol/L (ref 98–111)
CO2: 23 mmol/L (ref 22–32)
CREATININE: 1.61 mg/dL — AB (ref 0.44–1.00)
Calcium: 10 mg/dL (ref 8.9–10.3)
GFR calc Af Amer: 37 mL/min — ABNORMAL LOW (ref >60)
GFR, EST NON AFRICAN AMERICAN: 32 mL/min — AB (ref >60)
Glucose, Bld: 128 mg/dL — ABNORMAL HIGH (ref 70–99)
Potassium: 5 mmol/L (ref 3.5–5.1)
SODIUM: 140 mmol/L (ref 135–145)
Total Bilirubin: 0.6 mg/dL (ref 0.3–1.2)
Total Protein: 8.4 g/dL — ABNORMAL HIGH (ref 6.5–8.1)

## 2017-09-04 ENCOUNTER — Telehealth: Payer: Self-pay | Admitting: *Deleted

## 2017-09-04 NOTE — Telephone Encounter (Signed)
Left vm that IV iron is not needed. MD request that this apt be moved out in 2 months - lab/md/fereheme (labs a few days prior to md apt). Patient will need her kidney function checked again in 3 weeks. Per Dr. Jacinto Reap, he would like Dr. Julian Hy to manage the abn kidney function. I will fwd this message to her office.  Colette, please r/s pt's apts tomorrow from 2 months from now. Thanks, Nira Conn, RN

## 2017-09-05 ENCOUNTER — Inpatient Hospital Stay: Payer: PRIVATE HEALTH INSURANCE

## 2017-09-05 ENCOUNTER — Inpatient Hospital Stay: Payer: PRIVATE HEALTH INSURANCE | Admitting: Internal Medicine

## 2017-09-05 NOTE — Telephone Encounter (Signed)
Spoke with patient. Confirmed that she received my voice mail yesterday. She understands that she does not need any IV iron today. She will have new apts in September. Pt asked if her new apts have been sent in the mail. I explained that this was mailed to her yesterday. She thanked me for this information. She states that she will see her pcp in August and will have her doc recheck her renal function at that time. In the meantime, she will increase po fluid intake.

## 2017-09-12 ENCOUNTER — Other Ambulatory Visit: Payer: Self-pay | Admitting: Unknown Physician Specialty

## 2017-09-15 IMAGING — CT CT CHEST W/ CM
4 of 6 series · 15 of 36 positions shown, 17 images · IV contrast (isovue)
Comparison: None

CLINICAL DATA: History of hiatal hernia

EXAM:
CT CHEST, ABDOMEN, AND PELVIS WITH CONTRAST
TECHNIQUE: Multidetector CT imaging of the chest, abdomen and pelvis was
performed following the standard protocol during bolus
administration of intravenous contrast.
CONTRAST:  75 cc Isovue 370 IV

[Series 5: cap with 2 · axial · 0.64mm/px · z∈[-509,-404]mm · 2 of 64 slices shown]
[im 22/64  lung]
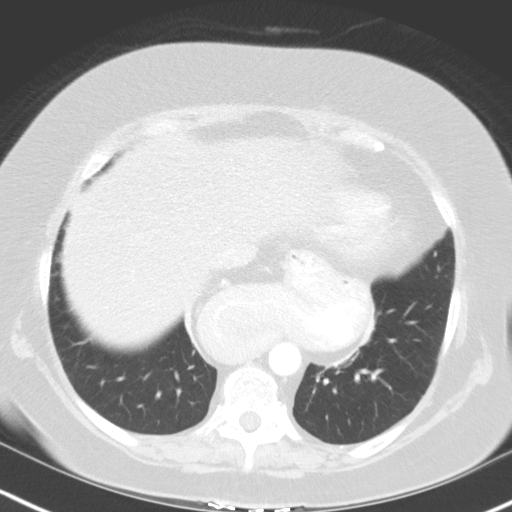
[im 43/64  lung]
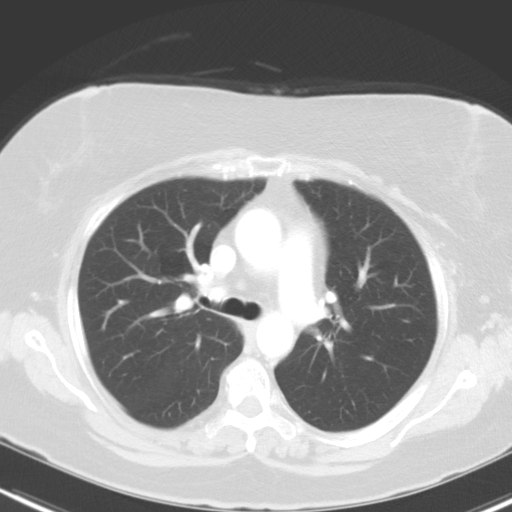

[Series 7: lung · axial · 0.64mm/px · z∈[-567,-541]mm · 2 of 148 slices shown]
[im 14/148  lung]
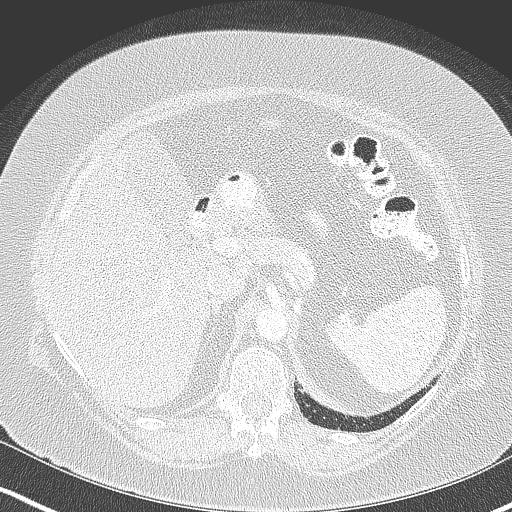
[im 27/148  lung]
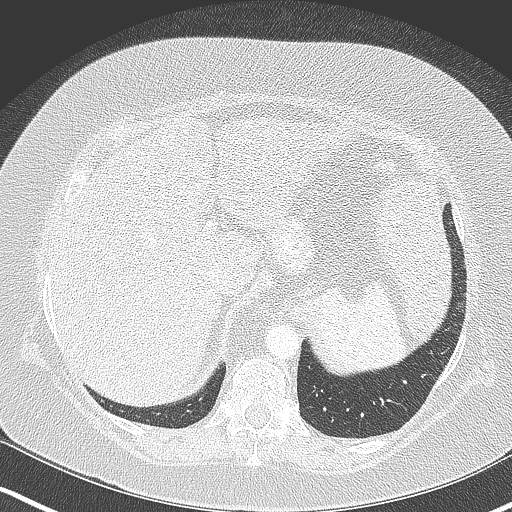

[Series 8: coronals · coronal · 0.67mm/px · 3 of 142 slices shown]
[im 29/142  lung]
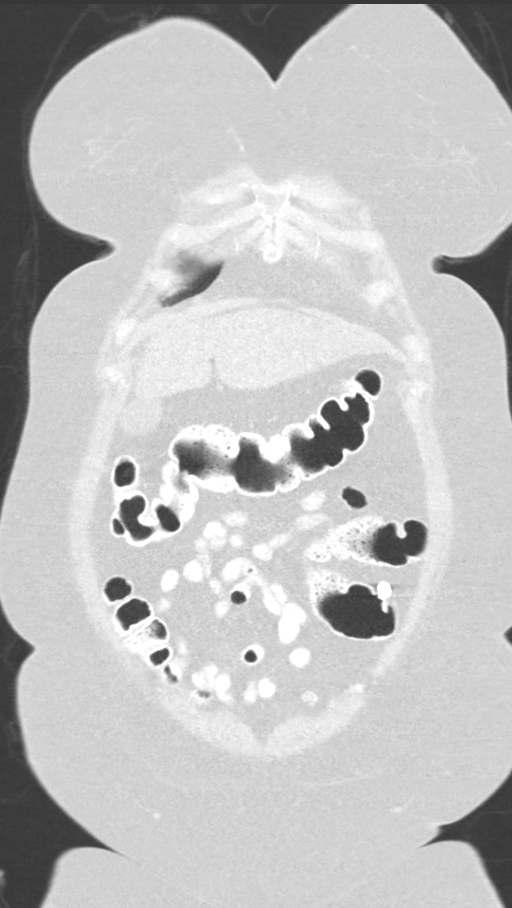
[im 57/142  lung]
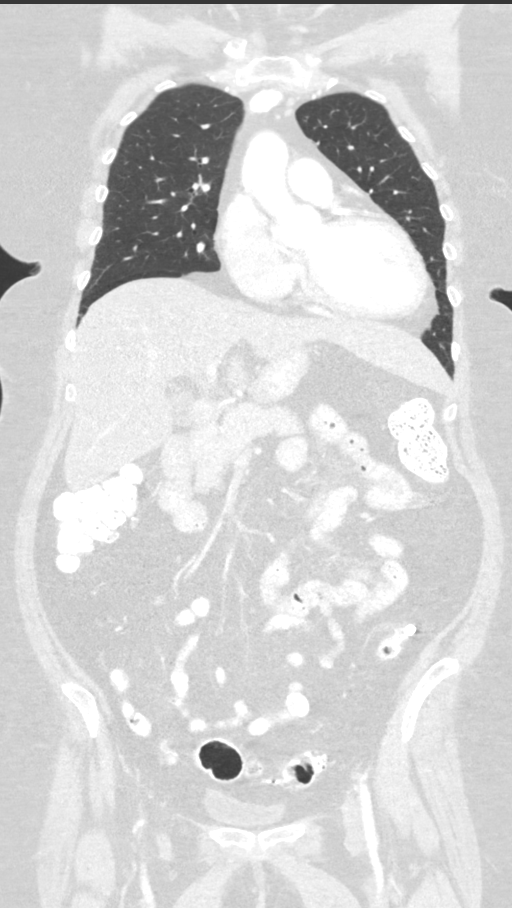
[im 85/142  lung]
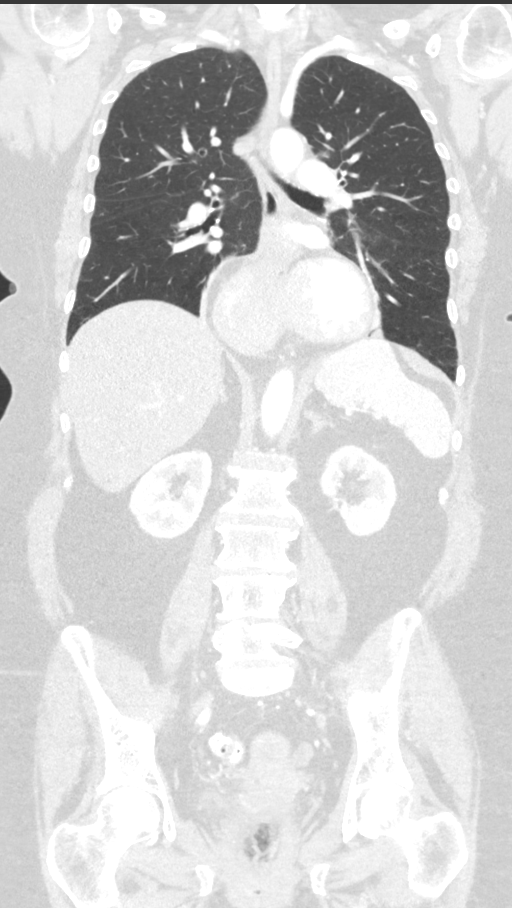

[Series 11: cap with. · axial · 0.65mm/px · z∈[-854,-369]mm · 8 of 125 slices shown, 10 images]
[im 14/125  mediastinal]
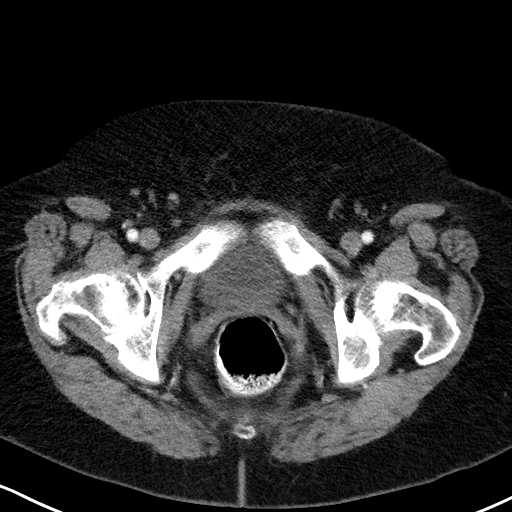
[im 14/125  lung]
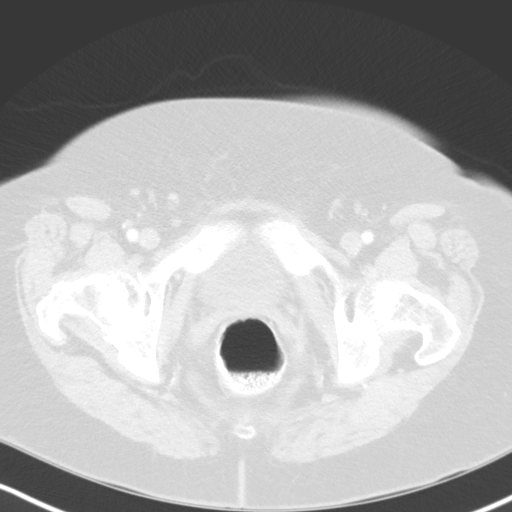
[im 28/125  lung]
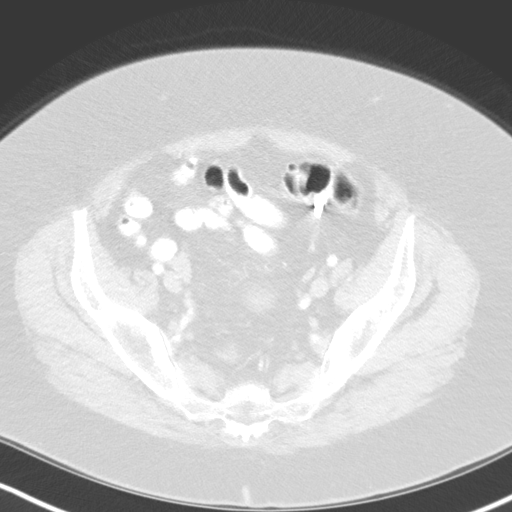
[im 42/125  lung]
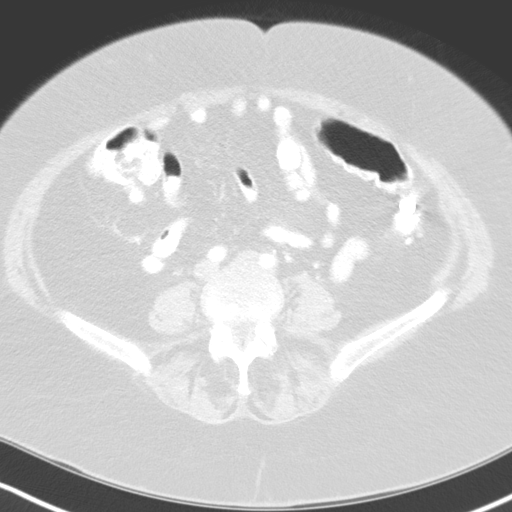
[im 56/125  lung]
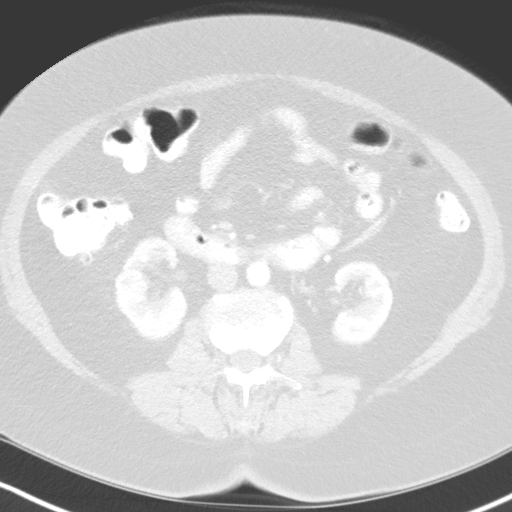
[im 69/125  mediastinal]
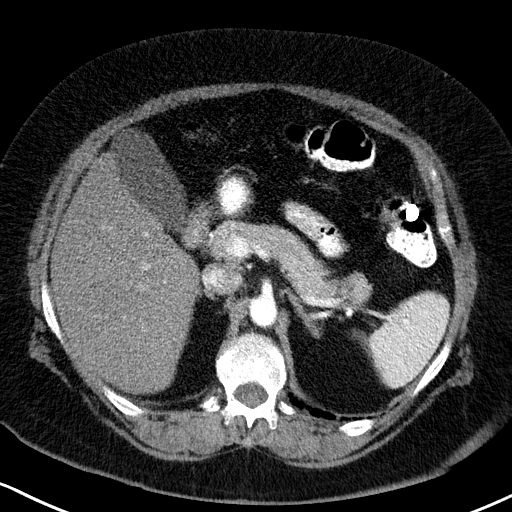
[im 69/125  lung]
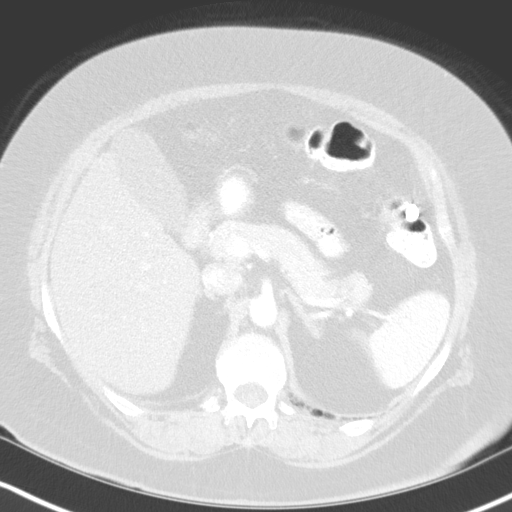
[im 83/125  lung]
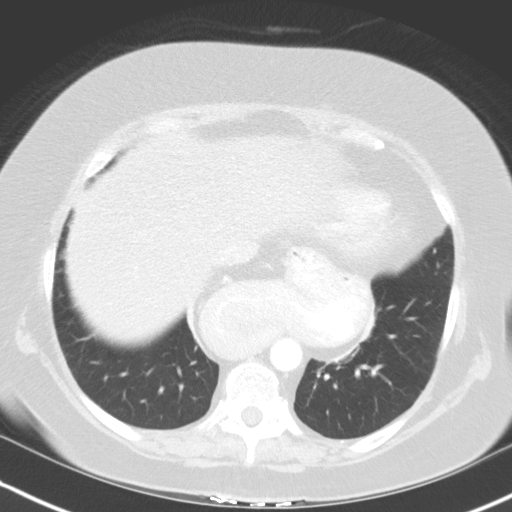
[im 97/125  lung]
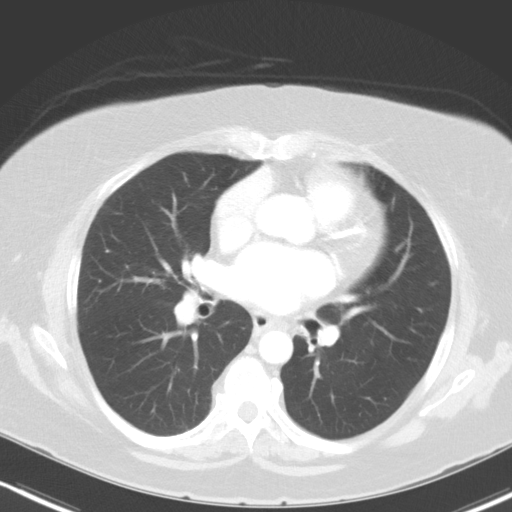
[im 111/125  lung]
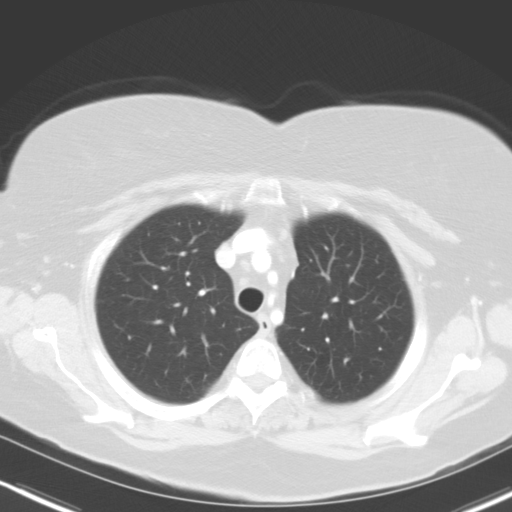

[15 of 36 positions shown; findings below may reference images not displayed]

FINDINGS: CT CHEST FINDINGS

Cardiovascular: Heart is borderline in size. Calcifications in the
left anterior descending and proximal left circumflex coronary
arteries. Aorta is normal caliber.

Mediastinum/Nodes: No mediastinal, hilar, or axillary adenopathy.
Large hiatal hernia containing the proximal half of the stomach.

Lungs/Pleura: Lungs are clear. No focal airspace opacities or
suspicious nodules. No effusions.

Musculoskeletal: Chest wall soft tissues are unremarkable. No acute
bony abnormality.

CT ABDOMEN PELVIS FINDINGS

Hepatobiliary: Fatty infiltration of the liver. No focal abnormality
or biliary ductal dilatation. Gallbladder unremarkable.

Pancreas: No focal abnormality or ductal dilatation.

Spleen: No focal abnormality.  Normal size.

Adrenals/Urinary Tract: No adrenal abnormality. No focal renal
abnormality. No stones or hydronephrosis. Urinary bladder is
unremarkable.

Stomach/Bowel: Sigmoid diverticulosis. No active diverticulitis.
Appendix is normal. Stomach and small bowel unremarkable.

Vascular/Lymphatic: No evidence of aneurysm or adenopathy.

Reproductive: Uterus and adnexa unremarkable.  No mass.

Other: No free fluid or free air.

Musculoskeletal: No acute bony abnormality.
IMPRESSION: Large hiatal hernia containing the proximal half of the stomach.

Coronary artery disease.

Fatty liver.

## 2017-09-15 NOTE — Telephone Encounter (Signed)
LOV 03/31/17 Erica Larsen  Next appointment 10/01/17

## 2017-09-22 ENCOUNTER — Other Ambulatory Visit: Payer: Self-pay | Admitting: Unknown Physician Specialty

## 2017-09-22 DIAGNOSIS — E039 Hypothyroidism, unspecified: Secondary | ICD-10-CM

## 2017-09-22 DIAGNOSIS — E1122 Type 2 diabetes mellitus with diabetic chronic kidney disease: Secondary | ICD-10-CM

## 2017-09-22 DIAGNOSIS — N183 Chronic kidney disease, stage 3 unspecified: Secondary | ICD-10-CM

## 2017-09-22 DIAGNOSIS — M810 Age-related osteoporosis without current pathological fracture: Secondary | ICD-10-CM

## 2017-09-23 NOTE — Telephone Encounter (Signed)
Sent in 7 days worth of medications, she needs to keep follow up for labs and more refills.     Januvia refill Last Refill:08/26/17 # 30 Last OV: 03/31/17 PCP: Carles Collet PA [address] Last Hgb A1C: 03/31/17: 6.6  levothyroxine refill Last Refill:06/23/17 # 90 Last OV: unable to find OV TSH 03/08/15: 4.320   raloxifene refill Last Refill:08/26/17 # 30 Last OV: unable to find OV

## 2017-09-29 ENCOUNTER — Encounter: Payer: Medicare Other | Admitting: Unknown Physician Specialty

## 2017-10-01 ENCOUNTER — Encounter: Payer: Medicare Other | Admitting: Physician Assistant

## 2017-10-01 NOTE — Progress Notes (Signed)
Subjective:    Patient ID: Erica Larsen, female    DOB: Jan 08, 1951, 67 y.o.   MRN: [id number]  Erica Larsen is a 67 y.o. female presenting on 10/02/2017 for Annual Exam; Hypertension; Hyperlipidemia; and Diabetes   HPI   Presents today for follow up.   Mammogram: last done 2011, due for mammogram, order placed today  PAP: not indicated  DEXA: 2014, t-score was -1.3, order placed, currently taking raloxifene Colonoscopy: 2017, tubular adenoma repeat 5 years  DM: A1c today is 6.6%. Tolerating current medications well, taking Januvia 100 mg.   CHF: Managed by Dr. Humphrey Rolls in [place]. Currently on Plavix and AsA 81 mg, bp medications as below. Has some SOB with exertion but not at rest. Some LE edema but no more than usual.   Hypothyroidism: Currently taking synthroid 100 mcg.   HTN: Taking olmesartan -amlodipine - HCT 40-5-12.5 mg daily, metoprolol succinate 50 mg daily after breakfast. She reports she cannot take higher than 5 mg amlodipine due to pedal edema. Reports cardiologist recently added on HCTZ.   BP Readings from Last 3 Encounters:  10/02/17 (!) 143/76  03/31/17 (!) 141/85  01/23/17 (!) 174/84    Osteopenia: On raloxifene 60 mg daily   HLD: Cholesterol is well controlled on Lipitor 80 mg daily.   Lipid Panel     Component Value Date/Time   CHOL 152 03/31/2017 1135   CHOL 132 09/18/2015 1053   TRIG 70 03/31/2017 1135   TRIG 95 09/18/2015 1053   HDL 82 03/31/2017 1135   VLDL 19 09/18/2015 1053   LDLCALC 56 03/31/2017 1135      Past Medical History:  Diagnosis Date  . Anemia 2017  . Arthritis    osteoarthritis  . CHF (congestive heart failure) ([place])   . Chronic kidney disease 2017   stage 3  . Coronary artery disease   . Diabetes mellitus without complication ([place])   . Diverticulosis   . Esophagitis   . GERD (gastroesophageal reflux disease)   . Hiatal hernia   . High cholesterol   . Hypercalcemia   . Hypertension   . Hypothyroidism   .  Lumbar radiculopathy   . Obesity   . Osteopenia    Hips  . Thyroid disease    Past Surgical History:  Procedure Laterality Date  . CATARACT EXTRACTION Bilateral    May - June 2018  . COLONOSCOPY WITH PROPOFOL N/A 08/01/2015   Procedure: COLONOSCOPY WITH PROPOFOL;  Surgeon: Lucilla Lame, MD;  Location: [facility] ENDOSCOPY;  Service: Endoscopy;  Laterality: N/A;  . CORONARY ANGIOPLASTY WITH STENT PLACEMENT    . ESOPHAGOGASTRODUODENOSCOPY (EGD) WITH PROPOFOL N/A 05/14/2016   Procedure: ESOPHAGOGASTRODUODENOSCOPY (EGD) WITH PROPOFOL;  Surgeon: Lucilla Lame, MD;  Location: [facility] ENDOSCOPY;  Service: Endoscopy;  Laterality: N/A;  . GIVENS CAPSULE STUDY N/A 06/01/2015   Procedure: GIVENS CAPSULE STUDY;  Surgeon: Lucilla Lame, MD;  Location: [facility] ENDOSCOPY;  Service: Endoscopy;  Laterality: N/A;  . HIATAL HERNIA REPAIR N/A 09/24/2016   Procedure: LAPAROSCOPIC REPAIR OF HIATAL HERNIA WITH FUNDOPLICATION WITH MESH (BIO-A);  Surgeon: Jules Husbands, MD;  Location: [facility] ORS;  Service: General;  Laterality: N/A;  Lithotomy position w arms tucked.   . pre cancer removed     left forearm   Social History   Socioeconomic History  . Marital status: Married    Spouse name: Not on file  . Number of children: Not on file  . Years of education: Not on file  . Highest  education level: Not on file  Occupational History  . Not on file  Social Needs  . Financial resource strain: Not on file  . Food insecurity:    Worry: Not on file    Inability: Not on file  . Transportation needs:    Medical: Not on file    Non-medical: Not on file  Tobacco Use  . Smoking status: Never Smoker  . Smokeless tobacco: Never Used  Substance and Sexual Activity  . Alcohol use: No  . Drug use: No  . Sexual activity: Yes    Birth control/protection: Post-menopausal  Lifestyle  . Physical activity:    Days per week: Not on file    Minutes per session: Not on file  . Stress: Not on file  Relationships  . Social connections:     Talks on phone: Not on file    Gets together: Not on file    Attends religious service: Not on file    Active member of club or organization: Not on file    Attends meetings of clubs or organizations: Not on file    Relationship status: Not on file  . Intimate partner violence:    Fear of current or ex partner: Not on file    Emotionally abused: Not on file    Physically abused: Not on file    Forced sexual activity: Not on file  Other Topics Concern  . Not on file  Social History Narrative  . Not on file   Family History  Problem Relation Age of Onset  . Heart disease Mother   . Kidney failure Mother   . Hypertension Mother   . Diabetes Mother   . Heart attack Father   . Hypertension Father   . Thyroid disease Father   . Hypertension Sister   . Diabetes Sister   . Thyroid disease Daughter   . Thyroid disease Daughter    Current Outpatient Medications on File Prior to Visit  Medication Sig  . aspirin EC 81 MG tablet Take 81 mg by mouth daily after breakfast.  . atorvastatin (LIPITOR) 80 MG tablet Take 80 mg by mouth daily after breakfast.   . clopidogrel (PLAVIX) 75 MG tablet Take 75 mg by mouth daily after breakfast.   . ferrous fumarate-iron polysaccharide complex (TANDEM) 162-115.2 MG CAPS capsule Take 1 capsule by mouth daily with breakfast.  . furosemide (LASIX) 40 MG tablet TAKE 1 TABLET BY MOUTH EVERY DAY  . lansoprazole (PREVACID) 30 MG capsule take 1 capsule by mouth once daily AT NOON  . levothyroxine (SYNTHROID, LEVOTHROID) 100 MCG tablet TAKE 1 TABLET BY MOUTH ONCE DAILY  . metoprolol succinate (TOPROL-XL) 50 MG 24 hr tablet Take 50 mg by mouth daily after breakfast. Take with or immediately following a meal.  . Polyethyl Glycol-Propyl Glycol (SYSTANE ULTRA OP) Place 1 drop into both eyes 2 (two) times daily.   [name] triamcinolone cream (KENALOG) 0.1 % apply to affected area twice a day AVOID FACE   No current facility-administered medications on file prior to  visit.     Review of Systems Per HPI unless specifically indicated above      Objective:    BP (!) 143/76   Pulse 67   Temp 97.8 F (36.6 C) (Oral)   Ht 5' 2" (1.575 m)   Wt 208 lb 4.8 oz (94.5 kg)   SpO2 98%   BMI 38.10 kg/m   Wt Readings from Last 3 Encounters:  10/02/17 208 lb 4.8 oz (94.5  kg)  03/31/17 206 lb 9.6 oz (93.7 kg)  01/23/17 202 lb (91.6 kg)    Physical Exam  Constitutional: She is oriented to person, place, and time. She appears well-developed and well-nourished.  Cardiovascular: Normal rate and regular rhythm.  Pulmonary/Chest: Effort normal and breath sounds normal.  Abdominal: Soft. Bowel sounds are normal.  Musculoskeletal: She exhibits edema.  Slight non pitting edema in bilateral lower extremities.   Neurological: She is alert and oriented to person, place, and time.  Skin: Skin is warm and dry.  Psychiatric: She has a normal mood and affect. Her behavior is normal.     Diabetic Foot Exam - Simple   Simple Foot Form Diabetic Foot exam was performed with the following findings:  Yes 10/02/2017  9:54 AM  Visual Inspection No deformities, no ulcerations, no other skin breakdown bilaterally:  Yes Sensation Testing Intact to touch and monofilament testing bilaterally:  Yes Pulse Check Posterior Tibialis and Dorsalis pulse intact bilaterally:  Yes Comments     Results for orders placed or performed in visit on 09/03/17  Iron and TIBC  Result Value Ref Range   Iron 100 28 - 170 ug/dL   TIBC 397 250 - 450 ug/dL   Saturation Ratios 25 10.4 - 31.8 %   UIBC 297 ug/dL  Ferritin  Result Value Ref Range   Ferritin 155 11 - 307 ng/mL  Comprehensive metabolic panel  Result Value Ref Range   Sodium 140 135 - 145 mmol/L   Potassium 5.0 3.5 - 5.1 mmol/L   Chloride 105 98 - 111 mmol/L   CO2 23 22 - 32 mmol/L   Glucose, Bld 128 (H) 70 - 99 mg/dL   BUN 30 (H) 8 - 23 mg/dL   Creatinine, Ser 1.61 (H) 0.44 - 1.00 mg/dL   Calcium 10.0 8.9 - 10.3 mg/dL    Total Protein 8.4 (H) 6.5 - 8.1 g/dL   Albumin 4.7 3.5 - 5.0 g/dL   AST 45 (H) 15 - 41 U/L   ALT 30 0 - 44 U/L   Alkaline Phosphatase 83 38 - 126 U/L   Total Bilirubin 0.6 0.3 - 1.2 mg/dL   GFR calc non Af Amer 32 (L) >60 mL/min   GFR calc Af Amer 37 (L) >60 mL/min   Anion gap 12 5 - 15  CBC with Differential/Platelet  Result Value Ref Range   WBC 7.5 3.6 - 11.0 K/uL   RBC 3.70 (L) 3.80 - 5.20 MIL/uL   Hemoglobin 12.5 12.0 - 16.0 g/dL   HCT 36.8 35.0 - 47.0 %   MCV 99.6 80.0 - 100.0 fL   MCH 33.9 26.0 - 34.0 pg   MCHC 34.0 32.0 - 36.0 g/dL   RDW 13.3 11.5 - 14.5 %   Platelets 156 150 - 440 K/uL   Neutrophils Relative % 56 %   Neutro Abs 4.2 1.4 - 6.5 K/uL   Lymphocytes Relative 28 %   Lymphs Abs 2.1 1.0 - 3.6 K/uL   Monocytes Relative 11 %   Monocytes Absolute 0.8 0.2 - 0.9 K/uL   Eosinophils Relative 5 %   Eosinophils Absolute 0.4 0 - 0.7 K/uL   Basophils Relative 0 %   Basophils Absolute 0.0 0 - 0.1 K/uL      Assessment & Plan:  1. Chronic diastolic heart failure (HCC)  - Comp Met (CMET)  2. Type 2 diabetes mellitus with stage 3 chronic kidney disease, without long-term current use of insulin ([place])  - [facility]  Hb A1c Waived (STAT) - sitaGLIPtin (JANUVIA) 100 MG tablet; Take 1 tablet (100 mg total) by mouth daily for 7 days.  Dispense: 90 tablet; Refill: 0  3. Chronic kidney disease, stage 3 (HCC)  - Comp Met (CMET)  4. Hypothyroidism, unspecified type  - TSH  5. Needs flu shot   6. Breast cancer screening  - MM Digital Screening; Future  7. Postmenopausal  - DG Bone Density; Future  8. Osteoporosis, unspecified osteoporosis type, unspecified pathological fracture presence  - raloxifene (EVISTA) 60 MG tablet; Take 1 tablet (60 mg total) by mouth daily.  Dispense: 90 tablet; Refill: 0  9. Essential hypertension  - Olmesartan-amLODIPine-HCTZ 40-5-12.5 MG TABS; Take one tablet daily.  Dispense: 90 tablet    Follow up plan: Return in about 6  months (around 04/04/2018) for chronic issues .  Carles Collet, PA-C [facility] 10/02/2017, 10:03 AM

## 2017-10-02 ENCOUNTER — Ambulatory Visit (INDEPENDENT_AMBULATORY_CARE_PROVIDER_SITE_OTHER): Payer: PRIVATE HEALTH INSURANCE | Admitting: Physician Assistant

## 2017-10-02 ENCOUNTER — Encounter: Payer: Self-pay | Admitting: Physician Assistant

## 2017-10-02 ENCOUNTER — Other Ambulatory Visit: Payer: Self-pay

## 2017-10-02 VITALS — BP 143/76 | HR 67 | Temp 97.8°F | Ht 62.0 in | Wt 208.3 lb

## 2017-10-02 DIAGNOSIS — I5032 Chronic diastolic (congestive) heart failure: Secondary | ICD-10-CM

## 2017-10-02 DIAGNOSIS — Z780 Asymptomatic menopausal state: Secondary | ICD-10-CM

## 2017-10-02 DIAGNOSIS — N183 Chronic kidney disease, stage 3 unspecified: Secondary | ICD-10-CM

## 2017-10-02 DIAGNOSIS — Z23 Encounter for immunization: Secondary | ICD-10-CM

## 2017-10-02 DIAGNOSIS — E1122 Type 2 diabetes mellitus with diabetic chronic kidney disease: Secondary | ICD-10-CM | POA: Diagnosis not present

## 2017-10-02 DIAGNOSIS — Z1231 Encounter for screening mammogram for malignant neoplasm of breast: Secondary | ICD-10-CM

## 2017-10-02 DIAGNOSIS — M810 Age-related osteoporosis without current pathological fracture: Secondary | ICD-10-CM | POA: Diagnosis not present

## 2017-10-02 DIAGNOSIS — I10 Essential (primary) hypertension: Secondary | ICD-10-CM

## 2017-10-02 DIAGNOSIS — E039 Hypothyroidism, unspecified: Secondary | ICD-10-CM | POA: Diagnosis not present

## 2017-10-02 DIAGNOSIS — Z1239 Encounter for other screening for malignant neoplasm of breast: Secondary | ICD-10-CM

## 2017-10-02 LAB — BAYER DCA HB A1C WAIVED: HB A1C (BAYER DCA - WAIVED): 6.6 % (ref <7.0)

## 2017-10-02 MED ORDER — OLMESARTAN-AMLODIPINE-HCTZ 40-5-12.5 MG PO TABS: ORAL_TABLET | ORAL | Status: DC

## 2017-10-02 MED ORDER — RALOXIFENE HCL 60 MG PO TABS: 60.0000 mg | ORAL_TABLET | Freq: Every day | ORAL | 0 refills | Status: DC

## 2017-10-02 MED ORDER — SITAGLIPTIN PHOSPHATE 100 MG PO TABS: 100.0000 mg | ORAL_TABLET | Freq: Every day | ORAL | 0 refills | Status: DC

## 2017-10-03 ENCOUNTER — Other Ambulatory Visit: Payer: Self-pay | Admitting: Physician Assistant

## 2017-10-03 DIAGNOSIS — E039 Hypothyroidism, unspecified: Secondary | ICD-10-CM

## 2017-10-03 LAB — COMPREHENSIVE METABOLIC PANEL
ALT: 33 IU/L — ABNORMAL HIGH (ref 0–32)
AST: 42 IU/L — ABNORMAL HIGH (ref 0–40)
Albumin/Globulin Ratio: 1.6 (ref 1.2–2.2)
Albumin: 4.7 g/dL (ref 3.6–4.8)
Alkaline Phosphatase: 80 IU/L (ref 39–117)
BUN/Creatinine Ratio: 17 (ref 12–28)
BUN: 28 mg/dL — ABNORMAL HIGH (ref 8–27)
Bilirubin Total: 0.4 mg/dL (ref 0.0–1.2)
CO2: 25 mmol/L (ref 20–29)
Calcium: 9.6 mg/dL (ref 8.7–10.3)
Chloride: 97 mmol/L (ref 96–106)
Creatinine, Ser: 1.61 mg/dL — ABNORMAL HIGH (ref 0.57–1.00)
GFR calc Af Amer: 37 mL/min/{1.73_m2} — ABNORMAL LOW (ref >59)
GFR calc non Af Amer: 32 mL/min/{1.73_m2} — ABNORMAL LOW (ref >59)
Globulin, Total: 2.9 g/dL (ref 1.5–4.5)
Glucose: 122 mg/dL — ABNORMAL HIGH (ref 65–99)
Potassium: 5 mmol/L (ref 3.5–5.2)
Sodium: 138 mmol/L (ref 134–144)
Total Protein: 7.6 g/dL (ref 6.0–8.5)

## 2017-10-03 LAB — TSH: TSH: 5.5 u[IU]/mL — ABNORMAL HIGH (ref 0.450–4.500)

## 2017-10-03 MED ORDER — LEVOTHYROXINE SODIUM 112 MCG PO TABS: 112.0000 ug | ORAL_TABLET | Freq: Every day | ORAL | 0 refills | Status: DC

## 2017-10-06 ENCOUNTER — Other Ambulatory Visit: Payer: Self-pay

## 2017-10-06 DIAGNOSIS — E1122 Type 2 diabetes mellitus with diabetic chronic kidney disease: Secondary | ICD-10-CM

## 2017-10-06 DIAGNOSIS — N183 Chronic kidney disease, stage 3 unspecified: Secondary | ICD-10-CM

## 2017-10-06 MED ORDER — SITAGLIPTIN PHOSPHATE 50 MG PO TABS: 100.0000 mg | ORAL_TABLET | Freq: Every day | ORAL | 0 refills | Status: DC

## 2017-10-06 MED ORDER — SITAGLIPTIN PHOSPHATE 50 MG PO TABS: 50.0000 mg | ORAL_TABLET | Freq: Every day | ORAL | 0 refills | Status: DC

## 2017-10-06 NOTE — Addendum Note (Signed)
Addended by: Trinna Post on: 10/06/2017 05:26 PM   Modules accepted: Orders

## 2017-10-06 NOTE — Telephone Encounter (Signed)
-----   Message from Amada Kingfisher, [place] sent at 10/03/2017  9:57 AM EDT ----- Left message on machine for pt to return call to the office.

## 2017-10-13 ENCOUNTER — Other Ambulatory Visit: Payer: Self-pay | Admitting: Unknown Physician Specialty

## 2017-10-13 NOTE — Telephone Encounter (Signed)
Lasix refill Last Refill:09/15/17 #30 Last OV: 10/02/17 with Dorna Bloom

## 2017-10-29 ENCOUNTER — Other Ambulatory Visit: Payer: PRIVATE HEALTH INSURANCE

## 2017-10-31 ENCOUNTER — Ambulatory Visit: Payer: PRIVATE HEALTH INSURANCE | Admitting: Internal Medicine

## 2017-10-31 ENCOUNTER — Ambulatory Visit: Payer: PRIVATE HEALTH INSURANCE

## 2017-11-04 DIAGNOSIS — Z9861 Coronary angioplasty status: Secondary | ICD-10-CM | POA: Diagnosis not present

## 2017-11-04 DIAGNOSIS — R0602 Shortness of breath: Secondary | ICD-10-CM | POA: Diagnosis not present

## 2017-11-04 DIAGNOSIS — I10 Essential (primary) hypertension: Secondary | ICD-10-CM | POA: Diagnosis not present

## 2017-11-04 DIAGNOSIS — I340 Nonrheumatic mitral (valve) insufficiency: Secondary | ICD-10-CM | POA: Diagnosis not present

## 2017-11-04 DIAGNOSIS — E782 Mixed hyperlipidemia: Secondary | ICD-10-CM | POA: Diagnosis not present

## 2017-11-04 DIAGNOSIS — I2510 Atherosclerotic heart disease of native coronary artery without angina pectoris: Secondary | ICD-10-CM | POA: Diagnosis not present

## 2017-11-07 DIAGNOSIS — C44529 Squamous cell carcinoma of skin of other part of trunk: Secondary | ICD-10-CM | POA: Diagnosis not present

## 2017-11-07 DIAGNOSIS — D485 Neoplasm of uncertain behavior of skin: Secondary | ICD-10-CM | POA: Diagnosis not present

## 2017-11-07 DIAGNOSIS — L281 Prurigo nodularis: Secondary | ICD-10-CM | POA: Diagnosis not present

## 2017-11-07 DIAGNOSIS — C44629 Squamous cell carcinoma of skin of left upper limb, including shoulder: Secondary | ICD-10-CM | POA: Diagnosis not present

## 2017-11-11 ENCOUNTER — Other Ambulatory Visit: Payer: Self-pay | Admitting: Physician Assistant

## 2017-11-17 ENCOUNTER — Other Ambulatory Visit: Payer: Medicare Other

## 2017-11-17 DIAGNOSIS — E039 Hypothyroidism, unspecified: Secondary | ICD-10-CM | POA: Diagnosis not present

## 2017-11-18 ENCOUNTER — Telehealth: Payer: Self-pay | Admitting: Family Medicine

## 2017-11-18 DIAGNOSIS — E039 Hypothyroidism, unspecified: Secondary | ICD-10-CM

## 2017-11-18 LAB — TSH: TSH: 1.14 u[IU]/mL (ref 0.450–4.500)

## 2017-11-18 MED ORDER — LEVOTHYROXINE SODIUM 112 MCG PO TABS: 112.0000 ug | ORAL_TABLET | Freq: Every day | ORAL | 3 refills | Status: DC

## 2017-11-18 NOTE — Telephone Encounter (Signed)
Please let her know that her thyroid came back normal. I've sent refills to her pharmacy. Thanks!

## 2017-11-18 NOTE — Telephone Encounter (Signed)
Patient notified

## 2017-12-01 DIAGNOSIS — C44529 Squamous cell carcinoma of skin of other part of trunk: Secondary | ICD-10-CM | POA: Diagnosis not present

## 2017-12-03 ENCOUNTER — Other Ambulatory Visit: Payer: Self-pay | Admitting: *Deleted

## 2017-12-03 ENCOUNTER — Inpatient Hospital Stay: Payer: PRIVATE HEALTH INSURANCE | Attending: Internal Medicine

## 2017-12-03 DIAGNOSIS — K219 Gastro-esophageal reflux disease without esophagitis: Secondary | ICD-10-CM | POA: Insufficient documentation

## 2017-12-03 DIAGNOSIS — E039 Hypothyroidism, unspecified: Secondary | ICD-10-CM | POA: Diagnosis not present

## 2017-12-03 DIAGNOSIS — I129 Hypertensive chronic kidney disease with stage 1 through stage 4 chronic kidney disease, or unspecified chronic kidney disease: Secondary | ICD-10-CM | POA: Diagnosis not present

## 2017-12-03 DIAGNOSIS — E669 Obesity, unspecified: Secondary | ICD-10-CM | POA: Diagnosis not present

## 2017-12-03 DIAGNOSIS — K449 Diaphragmatic hernia without obstruction or gangrene: Secondary | ICD-10-CM | POA: Insufficient documentation

## 2017-12-03 DIAGNOSIS — I2510 Atherosclerotic heart disease of native coronary artery without angina pectoris: Secondary | ICD-10-CM | POA: Diagnosis not present

## 2017-12-03 DIAGNOSIS — Z7982 Long term (current) use of aspirin: Secondary | ICD-10-CM | POA: Diagnosis not present

## 2017-12-03 DIAGNOSIS — M1990 Unspecified osteoarthritis, unspecified site: Secondary | ICD-10-CM | POA: Insufficient documentation

## 2017-12-03 DIAGNOSIS — E7800 Pure hypercholesterolemia, unspecified: Secondary | ICD-10-CM | POA: Diagnosis not present

## 2017-12-03 DIAGNOSIS — M8580 Other specified disorders of bone density and structure, unspecified site: Secondary | ICD-10-CM | POA: Insufficient documentation

## 2017-12-03 DIAGNOSIS — E119 Type 2 diabetes mellitus without complications: Secondary | ICD-10-CM | POA: Insufficient documentation

## 2017-12-03 DIAGNOSIS — I509 Heart failure, unspecified: Secondary | ICD-10-CM | POA: Diagnosis not present

## 2017-12-03 DIAGNOSIS — D696 Thrombocytopenia, unspecified: Secondary | ICD-10-CM | POA: Diagnosis not present

## 2017-12-03 DIAGNOSIS — D500 Iron deficiency anemia secondary to blood loss (chronic): Secondary | ICD-10-CM | POA: Diagnosis not present

## 2017-12-03 DIAGNOSIS — Z79899 Other long term (current) drug therapy: Secondary | ICD-10-CM | POA: Insufficient documentation

## 2017-12-03 DIAGNOSIS — N183 Chronic kidney disease, stage 3 (moderate): Secondary | ICD-10-CM | POA: Insufficient documentation

## 2017-12-03 LAB — COMPREHENSIVE METABOLIC PANEL
ALK PHOS: 67 U/L (ref 38–126)
ALT: 35 U/L (ref 0–44)
ANION GAP: 11 (ref 5–15)
AST: 43 U/L — ABNORMAL HIGH (ref 15–41)
Albumin: 4.5 g/dL (ref 3.5–5.0)
BUN: 33 mg/dL — ABNORMAL HIGH (ref 8–23)
CALCIUM: 9.5 mg/dL (ref 8.9–10.3)
CO2: 27 mmol/L (ref 22–32)
CREATININE: 1.68 mg/dL — AB (ref 0.44–1.00)
Chloride: 100 mmol/L (ref 98–111)
GFR calc Af Amer: 35 mL/min — ABNORMAL LOW (ref >60)
GFR, EST NON AFRICAN AMERICAN: 30 mL/min — AB (ref >60)
Glucose, Bld: 144 mg/dL — ABNORMAL HIGH (ref 70–99)
Potassium: 4.6 mmol/L (ref 3.5–5.1)
SODIUM: 138 mmol/L (ref 135–145)
Total Bilirubin: 0.7 mg/dL (ref 0.3–1.2)
Total Protein: 7.8 g/dL (ref 6.5–8.1)

## 2017-12-03 LAB — CBC WITH DIFFERENTIAL/PLATELET
Abs Immature Granulocytes: 0.02 10*3/uL (ref 0.00–0.07)
BASOS ABS: 0 10*3/uL (ref 0.0–0.1)
BASOS PCT: 0 %
EOS ABS: 0.4 10*3/uL (ref 0.0–0.5)
EOS PCT: 5 %
HEMATOCRIT: 35.4 % — AB (ref 36.0–46.0)
Hemoglobin: 11.9 g/dL — ABNORMAL LOW (ref 12.0–15.0)
IMMATURE GRANULOCYTES: 0 %
LYMPHS ABS: 1.4 10*3/uL (ref 0.7–4.0)
Lymphocytes Relative: 19 %
MCH: 32.3 pg (ref 26.0–34.0)
MCHC: 33.6 g/dL (ref 30.0–36.0)
MCV: 96.2 fL (ref 80.0–100.0)
Monocytes Absolute: 0.9 10*3/uL (ref 0.1–1.0)
Monocytes Relative: 11 %
NEUTROS PCT: 65 %
Neutro Abs: 4.9 10*3/uL (ref 1.7–7.7)
PLATELETS: 128 10*3/uL — AB (ref 150–400)
RBC: 3.68 MIL/uL — AB (ref 3.87–5.11)
RDW: 12.3 % (ref 11.5–15.5)
WBC: 7.6 10*3/uL (ref 4.0–10.5)
nRBC: 0 % (ref 0.0–0.2)

## 2017-12-03 LAB — IRON AND TIBC
IRON: 57 ug/dL (ref 28–170)
SATURATION RATIOS: 16 % (ref 10.4–31.8)
TIBC: 363 ug/dL (ref 250–450)
UIBC: 306 ug/dL

## 2017-12-03 LAB — FERRITIN: FERRITIN: 201 ng/mL (ref 11–307)

## 2017-12-05 ENCOUNTER — Inpatient Hospital Stay: Payer: PRIVATE HEALTH INSURANCE

## 2017-12-05 ENCOUNTER — Other Ambulatory Visit: Payer: Self-pay

## 2017-12-05 ENCOUNTER — Inpatient Hospital Stay (HOSPITAL_BASED_OUTPATIENT_CLINIC_OR_DEPARTMENT_OTHER): Payer: PRIVATE HEALTH INSURANCE | Admitting: Internal Medicine

## 2017-12-05 ENCOUNTER — Encounter: Payer: Self-pay | Admitting: Internal Medicine

## 2017-12-05 VITALS — BP 137/73 | HR 79 | Temp 95.5°F | Resp 16 | Ht 61.0 in | Wt 202.8 lb

## 2017-12-05 DIAGNOSIS — I509 Heart failure, unspecified: Secondary | ICD-10-CM | POA: Diagnosis not present

## 2017-12-05 DIAGNOSIS — I2510 Atherosclerotic heart disease of native coronary artery without angina pectoris: Secondary | ICD-10-CM | POA: Diagnosis not present

## 2017-12-05 DIAGNOSIS — Z79899 Other long term (current) drug therapy: Secondary | ICD-10-CM

## 2017-12-05 DIAGNOSIS — E119 Type 2 diabetes mellitus without complications: Secondary | ICD-10-CM | POA: Diagnosis not present

## 2017-12-05 DIAGNOSIS — E7800 Pure hypercholesterolemia, unspecified: Secondary | ICD-10-CM

## 2017-12-05 DIAGNOSIS — M1990 Unspecified osteoarthritis, unspecified site: Secondary | ICD-10-CM

## 2017-12-05 DIAGNOSIS — I129 Hypertensive chronic kidney disease with stage 1 through stage 4 chronic kidney disease, or unspecified chronic kidney disease: Secondary | ICD-10-CM

## 2017-12-05 DIAGNOSIS — E039 Hypothyroidism, unspecified: Secondary | ICD-10-CM | POA: Diagnosis not present

## 2017-12-05 DIAGNOSIS — D500 Iron deficiency anemia secondary to blood loss (chronic): Secondary | ICD-10-CM | POA: Diagnosis not present

## 2017-12-05 DIAGNOSIS — N183 Chronic kidney disease, stage 3 (moderate): Secondary | ICD-10-CM | POA: Diagnosis not present

## 2017-12-05 DIAGNOSIS — M8580 Other specified disorders of bone density and structure, unspecified site: Secondary | ICD-10-CM

## 2017-12-05 DIAGNOSIS — K449 Diaphragmatic hernia without obstruction or gangrene: Secondary | ICD-10-CM

## 2017-12-05 DIAGNOSIS — E669 Obesity, unspecified: Secondary | ICD-10-CM

## 2017-12-05 DIAGNOSIS — K219 Gastro-esophageal reflux disease without esophagitis: Secondary | ICD-10-CM

## 2017-12-05 DIAGNOSIS — D696 Thrombocytopenia, unspecified: Secondary | ICD-10-CM

## 2017-12-05 DIAGNOSIS — Z7982 Long term (current) use of aspirin: Secondary | ICD-10-CM

## 2017-12-05 NOTE — Progress Notes (Signed)
[place] NOTE  Patient Care Team: Kathrine Haddock, NP as PCP - General (Nurse Practitioner) Dionisio David, MD as Consulting Physician (Cardiology) Cammie Sickle, MD as Consulting Physician (Internal Medicine)  CHIEF COMPLAINTS/PURPOSE OF CONSULTATION:   # 2014- Iron def Anemia-  [Sep 2014 EGD/COLO- NEG/ferritin 6; Dr.Wohl]. 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   # Hx CHF/CAD- asprin-plavix; Sep 2018- s/p hiatal hernia-fundoplication [Dr.Pabon]  HISTORY OF PRESENTING ILLNESS:  Erica Larsen 67 y.o.  female patient with above history of iron deficiency anemia of unclear etiology is here status post IV iron in November 2016 is here for follow-up.   No nausea no vomiting.  Appetite is good but no weight loss.  No difficulty swallowing.   .Review of Systems  Constitutional: Negative for chills, diaphoresis, fever, malaise/fatigue and weight loss.  HENT: Negative for nosebleeds and sore throat.   Eyes: Negative for double vision.  Respiratory: Negative for cough, hemoptysis, sputum production, shortness of breath and wheezing.   Cardiovascular: Negative for chest pain, palpitations, orthopnea and leg swelling.  Gastrointestinal: Negative for abdominal pain, blood in stool, constipation, diarrhea, heartburn, melena, nausea and vomiting.  Genitourinary: Negative for dysuria, frequency and urgency.  Musculoskeletal: Negative for back pain and joint pain.  Skin: Negative.  Negative for itching and rash.  Neurological: Negative for dizziness, tingling, focal weakness, weakness and headaches.  Endo/Heme/Allergies: Does not bruise/bleed easily.  Psychiatric/Behavioral: Negative for depression. The patient is not nervous/anxious and does not have insomnia.      MEDICAL HISTORY:  Past Medical History:  Diagnosis Date  . Anemia 2017  .  Arthritis    osteoarthritis  . CHF (congestive heart failure) ([place])   . Chronic kidney disease 2017   stage 3  . Coronary artery disease   . Diabetes mellitus without complication ([place])   . Diverticulosis   . Esophagitis   . GERD (gastroesophageal reflux disease)   . Hiatal hernia   . High cholesterol   . Hypercalcemia   . Hypertension   . Hypothyroidism   . Lumbar radiculopathy   . Obesity   . Osteopenia    Hips  . Thyroid disease     SURGICAL HISTORY: Past Surgical History:  Procedure Laterality Date  . CATARACT EXTRACTION Bilateral    May - June 2018  . COLONOSCOPY WITH PROPOFOL N/A 08/01/2015   Procedure: COLONOSCOPY WITH PROPOFOL;  Surgeon: Lucilla Lame, MD;  Location: [facility] END[facility]COPY;  Service: Endoscopy;  Laterality: N/A;  . CORONARY ANGIOPLASTY WITH STENT PLACEMENT    . ESOPHAGOGASTRODUODENOSCOPY (EGD) WITH PROPOFOL N/A 05/14/2016   Procedure: ESOPHAGOGASTRODUODENOSCOPY (EGD) WITH PROPOFOL;  Surgeon: Lucilla Lame, MD;  Location: [facility] END[facility];  Service: Endoscopy;  Laterality: N/A;  . GIVENS CAPSULE STUDY N/A 06/01/2015   Procedure: GIVENS CAPSULE STUDY;  Surgeon: Lucilla Lame, MD;  Location: [facility];  Service: Endoscopy;  Laterality: N/A;  . HIATAL HERNIA REPAIR N/A 09/24/2016   Procedure: LAPAROSCOPIC REPAIR OF HIATAL HERNIA WITH FUNDOPLICATION WITH MESH (BIO-A);  Surgeon: Jules Husbands, MD;  Location: [facility] ORS;  Service: General;  Laterality: N/A;  Lithotomy position w arms tucked.   . pre cancer removed     left forearm    SOCIAL HISTORY: Social History   Socioeconomic History  . Marital status: Married    Spouse name: Not on file  . Number of children: Not on file  . Years of education: Not on file  .  Highest education level: Not on file  Occupational History  . Not on file  Social Needs  . Financial resource strain: Not on file  . Food insecurity:    Worry: Not on file    Inability: Not on file  . Transportation needs:    Medical: Not on file     Non-medical: Not on file  Tobacco Use  . Smoking status: Never Smoker  . Smokeless tobacco: Never Used  Substance and Sexual Activity  . Alcohol use: No  . Drug use: No  . Sexual activity: Yes    Birth control/protection: Post-menopausal  Lifestyle  . Physical activity:    Days per week: Not on file    Minutes per session: Not on file  . Stress: Not on file  Relationships  . Social connections:    Talks on phone: Not on file    Gets together: Not on file    Attends religious service: Not on file    Active member of club or organization: Not on file    Attends meetings of clubs or organizations: Not on file    Relationship status: Not on file  . Intimate partner violence:    Fear of current or ex partner: Not on file    Emotionally abused: Not on file    Physically abused: Not on file    Forced sexual activity: Not on file  Other Topics Concern  . Not on file  Social History Narrative  . Not on file    FAMILY HISTORY: Family History  Problem Relation Age of Onset  . Heart disease Mother   . Kidney failure Mother   . Hypertension Mother   . Diabetes Mother   . Heart attack Father   . Hypertension Father   . Thyroid disease Father   . Hypertension Sister   . Diabetes Sister   . Thyroid disease Daughter   . Thyroid disease Daughter     ALLERGIES:  is allergic to sulfa antibiotics and latex.  MEDICATIONS:  Current Outpatient Medications  Medication Sig Dispense Refill  . aspirin EC 81 MG tablet Take 81 mg by mouth daily after breakfast.    . atorvastatin (LIPITOR) 80 MG tablet Take 80 mg by mouth daily after breakfast.   0  . clopidogrel (PLAVIX) 75 MG tablet Take 75 mg by mouth daily after breakfast.     . furosemide (LASIX) 40 MG tablet TAKE 1 TABLET BY MOUTH EVERY DAY 30 tablet 0  . lansoprazole (PREVACID) 30 MG capsule take 1 capsule by mouth once daily AT NOON 90 capsule 3  . levothyroxine (SYNTHROID, LEVOTHROID) 112 MCG tablet Take 1 tablet (112 mcg total)  by mouth daily. 90 tablet 3  . metoprolol succinate (TOPROL-XL) 50 MG 24 hr tablet Take 50 mg by mouth daily after breakfast. Take with or immediately following a meal.    . Olmesartan-amLODIPine-HCTZ 40-5-12.5 MG TABS Take one tablet daily. 90 tablet   . Polyethyl Glycol-Propyl Glycol (SYSTANE ULTRA OP) Place 1 drop into both eyes 2 (two) times daily.     . raloxifene (EVISTA) 60 MG tablet Take 1 tablet (60 mg total) by mouth daily. 90 tablet 0  . sitaGLIPtin (JANUVIA) 50 MG tablet Take 1 tablet (50 mg total) by mouth daily. 90 tablet 0  . triamcinolone cream (KENALOG) 0.1 % apply to affected area twice a day AVOID FACE  0  . ferrous fumarate-iron polysaccharide complex (TANDEM) 162-115.2 MG CAPS capsule Take 1 capsule by mouth daily with breakfast. (  Patient not taking: Reported on 12/05/2017) 90 capsule 3   No current facility-administered medications for this visit.       [name]  PHYSICAL EXAMINATION: ECOG PERFORMANCE STATUS: 1 - Symptomatic but completely ambulatory  Vitals:   12/05/17 1312  BP: 137/73  Pulse: 79  Resp: 16  Temp: (!) 95.5 F (35.3 C)   Filed Weights   12/05/17 1312  Weight: 202 lb 12.8 oz (92 kg)    Physical Exam  Constitutional: She is oriented to person, place, and time and well-developed, well-nourished, and in no distress.  HENT:  Head: Normocephalic and atraumatic.  Mouth/Throat: Oropharynx is clear and moist. No oropharyngeal exudate.  Eyes: Pupils are equal, round, and reactive to light.  Neck: Normal range of motion. Neck supple.  Cardiovascular: Normal rate and regular rhythm.  Pulmonary/Chest: No respiratory distress. She has no wheezes.  Abdominal: Soft. Bowel sounds are normal. She exhibits no distension and no mass. There is no tenderness. There is no rebound and no guarding.  Musculoskeletal: Normal range of motion. She exhibits no edema or tenderness.  Neurological: She is alert and oriented to person, place, and time.  Skin: Skin is warm.   Psychiatric: Affect normal.     LABORATORY DATA:  I have reviewed the data as listed Lab Results  Component Value Date   WBC 7.6 12/03/2017   HGB 11.9 (L) 12/03/2017   HCT 35.4 (L) 12/03/2017   MCV 96.2 12/03/2017   PLT 128 (L) 12/03/2017   Recent Labs    09/03/17 0938 10/02/17 1004 12/03/17 1134  NA 140 138 138  K 5.0 5.0 4.6  CL 105 97 100  CO2 23 25 27   GLUCOSE 128* 122* 144*  BUN 30* 28* 33*  CREATININE 1.61* 1.61* 1.68*  CALCIUM 10.0 9.6 9.5  GFRNONAA 32* 32* 30*  GFRAA 37* 37* 35*  PROT 8.4* 7.6 7.8  ALBUMIN 4.7 4.7 4.5  AST 45* 42* 43*  ALT 30 33* 35  ALKPHOS 83 80 67  BILITOT 0.6 0.4 0.7     ASSESSMENT & PLAN:  Iron deficiency anemia due to chronic blood loss # Iron deficiency anemia- unclear etiology; question GI blood loss/CKD [hx large hiatal hernia status post recent fundoplication]   # Today hemoglobin is 11.9;  iron sat- 16%; recommend taking PO iron day. HOLD IV iron today.   # CKD-III [creat 1.6] sec to DM stable.  Defer to PCP.  # Mild thrombocytopenia- intermittent- 128; monitor for now.   # HOLD IV Ferrahem today.  # follow up in 6 months/ MD/possible IV ferreham labs- cbc/bmp/iron/ ferritin few days prior    Cammie Sickle, MD 12/06/2017 9:51 PM

## 2017-12-05 NOTE — Assessment & Plan Note (Addendum)
#   Iron deficiency anemia- unclear etiology; question GI blood loss/CKD [hx large hiatal hernia status post recent fundoplication]   # Today hemoglobin is 11.9;  iron sat- 16%; recommend taking PO iron day. HOLD IV iron today.   # CKD-III [creat 1.6] sec to DM stable.  Defer to PCP.  # Mild thrombocytopenia- intermittent- 128; monitor for now.   # HOLD IV Ferrahem today.  # follow up in 6 months/ MD/possible IV ferreham labs- cbc/bmp/iron/ ferritin few days prior

## 2017-12-10 ENCOUNTER — Other Ambulatory Visit: Payer: Self-pay | Admitting: Physician Assistant

## 2017-12-10 NOTE — Telephone Encounter (Signed)
Refill request approved.  Will check labs at February visit.

## 2017-12-15 DIAGNOSIS — C44629 Squamous cell carcinoma of skin of left upper limb, including shoulder: Secondary | ICD-10-CM | POA: Diagnosis not present

## 2017-12-29 ENCOUNTER — Other Ambulatory Visit: Payer: Self-pay | Admitting: Physician Assistant

## 2017-12-29 DIAGNOSIS — M810 Age-related osteoporosis without current pathological fracture: Secondary | ICD-10-CM

## 2017-12-30 ENCOUNTER — Other Ambulatory Visit: Payer: Self-pay | Admitting: Physician Assistant

## 2017-12-30 DIAGNOSIS — N183 Chronic kidney disease, stage 3 (moderate): Secondary | ICD-10-CM

## 2017-12-30 DIAGNOSIS — E1122 Type 2 diabetes mellitus with diabetic chronic kidney disease: Secondary | ICD-10-CM

## 2017-12-30 DIAGNOSIS — E039 Hypothyroidism, unspecified: Secondary | ICD-10-CM

## 2017-12-30 NOTE — Telephone Encounter (Signed)
Refill requests approved.  Has appt 03/27/2018.

## 2017-12-30 NOTE — Telephone Encounter (Signed)
Refill request approved

## 2018-01-08 ENCOUNTER — Other Ambulatory Visit: Payer: Self-pay | Admitting: Unknown Physician Specialty

## 2018-01-12 ENCOUNTER — Other Ambulatory Visit: Payer: Self-pay | Admitting: Nurse Practitioner

## 2018-03-25 ENCOUNTER — Encounter: Payer: Self-pay | Admitting: Nurse Practitioner

## 2018-03-25 DIAGNOSIS — E039 Hypothyroidism, unspecified: Secondary | ICD-10-CM | POA: Insufficient documentation

## 2018-03-27 ENCOUNTER — Ambulatory Visit: Payer: Medicare Other | Admitting: Nurse Practitioner

## 2018-03-29 ENCOUNTER — Other Ambulatory Visit: Payer: Self-pay | Admitting: Nurse Practitioner

## 2018-03-29 DIAGNOSIS — M810 Age-related osteoporosis without current pathological fracture: Secondary | ICD-10-CM

## 2018-04-01 ENCOUNTER — Telehealth: Payer: Self-pay | Admitting: Nurse Practitioner

## 2018-04-01 NOTE — Telephone Encounter (Signed)
Called patient to reschedule appointment for 2/14 due to [facility] having the flu no answer left voicemail. If she calls back please reschedule

## 2018-04-03 ENCOUNTER — Ambulatory Visit: Payer: Medicare Other | Admitting: Nurse Practitioner

## 2018-04-06 ENCOUNTER — Other Ambulatory Visit: Payer: Self-pay | Admitting: Unknown Physician Specialty

## 2018-04-08 ENCOUNTER — Encounter: Payer: Self-pay | Admitting: Nurse Practitioner

## 2018-04-08 ENCOUNTER — Other Ambulatory Visit: Payer: Self-pay

## 2018-04-08 ENCOUNTER — Ambulatory Visit (INDEPENDENT_AMBULATORY_CARE_PROVIDER_SITE_OTHER): Payer: PRIVATE HEALTH INSURANCE | Admitting: Nurse Practitioner

## 2018-04-08 VITALS — BP 125/66 | HR 64 | Temp 98.0°F | Ht 61.0 in | Wt 197.0 lb

## 2018-04-08 DIAGNOSIS — K222 Esophageal obstruction: Secondary | ICD-10-CM

## 2018-04-08 DIAGNOSIS — I130 Hypertensive heart and chronic kidney disease with heart failure and stage 1 through stage 4 chronic kidney disease, or unspecified chronic kidney disease: Secondary | ICD-10-CM | POA: Diagnosis not present

## 2018-04-08 DIAGNOSIS — E1169 Type 2 diabetes mellitus with other specified complication: Secondary | ICD-10-CM

## 2018-04-08 DIAGNOSIS — E039 Hypothyroidism, unspecified: Secondary | ICD-10-CM | POA: Diagnosis not present

## 2018-04-08 DIAGNOSIS — K219 Gastro-esophageal reflux disease without esophagitis: Secondary | ICD-10-CM

## 2018-04-08 DIAGNOSIS — Z6837 Body mass index (BMI) 37.0-37.9, adult: Secondary | ICD-10-CM | POA: Diagnosis not present

## 2018-04-08 DIAGNOSIS — E785 Hyperlipidemia, unspecified: Secondary | ICD-10-CM | POA: Diagnosis not present

## 2018-04-08 DIAGNOSIS — E1122 Type 2 diabetes mellitus with diabetic chronic kidney disease: Secondary | ICD-10-CM

## 2018-04-08 DIAGNOSIS — N183 Chronic kidney disease, stage 3 unspecified: Secondary | ICD-10-CM

## 2018-04-08 DIAGNOSIS — E669 Obesity, unspecified: Secondary | ICD-10-CM | POA: Insufficient documentation

## 2018-04-08 DIAGNOSIS — I5032 Chronic diastolic (congestive) heart failure: Secondary | ICD-10-CM

## 2018-04-08 LAB — BAYER DCA HB A1C WAIVED: HB A1C (BAYER DCA - WAIVED): 6.7 % (ref <7.0)

## 2018-04-08 NOTE — Assessment & Plan Note (Signed)
Chronic, ongoing.  Continue current medication regimen.  Lipid panel today. 

## 2018-04-08 NOTE — Assessment & Plan Note (Signed)
Recommend regular physical activity (30 minutes 5 days a week) and healthy diet choices.

## 2018-04-08 NOTE — Assessment & Plan Note (Signed)
Chronic, ongoing.  Continue current Levothyroxine dose and adjust as needed based on lab results.  Thyroid panel today.

## 2018-04-08 NOTE — Assessment & Plan Note (Signed)
Chronic, ongoing.  Continue current medication regimen, Januvia renal dosed at this time.  A1C today 6.7%.

## 2018-04-08 NOTE — Assessment & Plan Note (Signed)
Chronic, ongoing with BP below goal today.  Continue current medication regimen and collaboration with Dr. Humphrey Rolls.  CMP today.

## 2018-04-08 NOTE — Patient Instructions (Signed)
Carbohydrate Counting for Diabetes Mellitus, Adult  Carbohydrate counting is a method of keeping track of how many carbohydrates you eat. Eating carbohydrates naturally increases the amount of sugar (glucose) in the blood. Counting how many carbohydrates you eat helps keep your blood glucose within normal limits, which helps you manage your diabetes (diabetes mellitus). It is important to know how many carbohydrates you can safely have in each meal. This is different for every person. A diet and nutrition specialist (registered dietitian) can help you make a meal plan and calculate how many carbohydrates you should have at each meal and snack. Carbohydrates are found in the following foods:  Grains, such as breads and cereals.  Dried beans and soy products.  Starchy vegetables, such as potatoes, peas, and corn.  Fruit and fruit juices.  Milk and yogurt.  Sweets and snack foods, such as cake, cookies, candy, chips, and soft drinks. How do I count carbohydrates? There are two ways to count carbohydrates in food. You can use either of the methods or a combination of both. Reading "Nutrition Facts" on packaged food The "Nutrition Facts" list is included on the labels of almost all packaged foods and beverages in the [place] It includes:  The serving size.  Information about nutrients in each serving, including the grams (g) of carbohydrate per serving. To use the "Nutrition Facts":  Decide how many servings you will have.  Multiply the number of servings by the number of carbohydrates per serving.  The resulting number is the total amount of carbohydrates that you will be having. Learning standard serving sizes of other foods When you eat carbohydrate foods that are not packaged or do not include "Nutrition Facts" on the label, you need to measure the servings in order to count the amount of carbohydrates:  Measure the foods that you will eat with a food scale or measuring cup, if needed.   Decide how many standard-size servings you will eat.  Multiply the number of servings by 15. Most carbohydrate-rich foods have about 15 g of carbohydrates per serving. ? For example, if you eat 8 oz (170 g) of strawberries, you will have eaten 2 servings and 30 g of carbohydrates (2 servings x 15 g = 30 g).  For foods that have more than one food mixed, such as soups and casseroles, you must count the carbohydrates in each food that is included. The following list contains standard serving sizes of common carbohydrate-rich foods. Each of these servings has about 15 g of carbohydrates:   hamburger bun or  English muffin.   oz (15 mL) syrup.   oz (14 g) jelly.  1 slice of bread.  1 six-inch tortilla.  3 oz (85 g) cooked rice or pasta.  4 oz (113 g) cooked dried beans.  4 oz (113 g) starchy vegetable, such as peas, corn, or potatoes.  4 oz (113 g) hot cereal.  4 oz (113 g) mashed potatoes or  of a large baked potato.  4 oz (113 g) canned or frozen fruit.  4 oz (120 mL) fruit juice.  4-6 crackers.  6 chicken nuggets.  6 oz (170 g) unsweetened dry cereal.  6 oz (170 g) plain fat-free yogurt or yogurt sweetened with artificial sweeteners.  8 oz (240 mL) milk.  8 oz (170 g) fresh fruit or one small piece of fruit.  24 oz (680 g) popped popcorn. Example of carbohydrate counting Sample meal  3 oz (85 g) chicken breast.  6 oz (170 g)   brown rice.  4 oz (113 g) corn.  8 oz (240 mL) milk.  8 oz (170 g) strawberries with sugar-free whipped topping. Carbohydrate calculation 1. Identify the foods that contain carbohydrates: ? Rice. ? Corn. ? Milk. ? Strawberries. 2. Calculate how many servings you have of each food: ? 2 servings rice. ? 1 serving corn. ? 1 serving milk. ? 1 serving strawberries. 3. Multiply each number of servings by 15 g: ? 2 servings rice x 15 g = 30 g. ? 1 serving corn x 15 g = 15 g. ? 1 serving milk x 15 g = 15 g. ? 1 serving  strawberries x 15 g = 15 g. 4. Add together all of the amounts to find the total grams of carbohydrates eaten: ? 30 g + 15 g + 15 g + 15 g = 75 g of carbohydrates total. Summary  Carbohydrate counting is a method of keeping track of how many carbohydrates you eat.  Eating carbohydrates naturally increases the amount of sugar (glucose) in the blood.  Counting how many carbohydrates you eat helps keep your blood glucose within normal limits, which helps you manage your diabetes.  A diet and nutrition specialist (registered dietitian) can help you make a meal plan and calculate how many carbohydrates you should have at each meal and snack. This information is not intended to replace advice given to you by your health care provider. Make sure you discuss any questions you have with your health care provider. Document Released: 02/04/2005 Document Revised: 08/14/2016 Document Reviewed: 07/19/2015 Elsevier Interactive Patient Education  2019 [employer]  

## 2018-04-08 NOTE — Assessment & Plan Note (Signed)
Chronic, ongoing.  Continue collaboration with Dr. Humphrey Rolls, has upcoming follow-up.

## 2018-04-08 NOTE — Assessment & Plan Note (Signed)
Chronic, ongoing.  Last GFR 30.  Will recheck CMP today.  Discussed at length nephrology consult if continued decline noted on labs, she agrees with this plan of care.  Reviewed medication regimen to ensure renal dosing present.  Recommended to avoid medications containing Ibuprofen.

## 2018-04-08 NOTE — Progress Notes (Signed)
BP 125/66   Pulse 64   Temp 98 F (36.7 C) (Oral)   Ht 5\' 1"  (1.549 m)   Wt 197 lb (89.4 kg)   SpO2 96%   BMI 37.22 kg/m    Subjective:    Patient ID: Erica Larsen, female    DOB: 08/03/1950, 68 y.o.   MRN: [id number]  HPI: Erica Larsen is a 68 y.o. female  Chief Complaint  Patient presents with  . Diabetes    91mf/u  . Hypertension  . Hypothyroidism   DIABETES In August her Januvia was changed to 50 MG d/t renal dosing.  Her last A1C was 6.6% in August 2019. Hypoglycemic episodes:no Polydipsia/polyuria: no Visual disturbance: no Chest pain: no Paresthesias: no Glucose Monitoring: not regular, only when she feels bad  Fasting glucose: often in 120 range, this morning 158  Post prandial:  Evening:   Before meals: Taking Insulin?: no  Long acting insulin:  Short acting insulin: Blood Pressure Monitoring: rarely Retinal Examination: Not up to Date, last exam 2 years ago, have recommended returning Foot Exam: Up to Date Pneumovax: Up to Date Influenza: Up to Date Aspirin: yes   HYPERTENSION / HYPERLIPIDEMIA WITH CKD 3 and Diastolic CHF Continues on Atorvastatin for HLD.   She is followed by Dr. Humphrey Rolls, cardiology.  She is scheduled to see him next month.  Not currently followed by nephrology, discussed this at length with her.  She agrees if continued decline in kidney function noted on labs, will obtain referral. Satisfied with current treatment? yes Duration of hypertension: chronic BP monitoring frequency: rarely BP range: 110-120/60-70 BP medication side effects: no Duration of hyperlipidemia: chronic Cholesterol medication side effects: no Cholesterol supplements: none Medication compliance: good compliance Aspirin: yes Recent stressors: no Recurrent headaches: no Visual changes: no Palpitations: no Dyspnea: no Chest pain: no Lower extremity edema: no Dizzy/lightheaded: no   GERD Continues on Prevacid.  Has h/o hernia surgery in August  2018. GERD control status: controlled  Satisfied with current treatment? yes Heartburn frequency:  Medication side effects: no  Medication compliance: good Previous GERD medications: Antacid use frequency:   Duration:  Nature:  Location:  Heartburn duration:  Alleviatiating factors:   Aggravating factors:  Dysphagia: no Odynophagia:  no Hematemesis: no Blood in stool: no EGD: no  HYPOTHYROIDISM Reports taking her Levothyroxine in the morning, Levothyroxine 112 MCG at present.   Thyroid control status:controlled Satisfied with current treatment? yes Medication side effects: no Medication compliance: excellent compliance Etiology of hypothyroidism:  Recent dose adjustment:no Fatigue: no Cold intolerance: no Heat intolerance: no Weight gain: no Weight loss: no Constipation: no Diarrhea/loose stools: no Palpitations: no Lower extremity edema: no Anxiety/depressed mood: no  Relevant past medical, surgical, family and social history reviewed and updated as indicated. Interim medical history since our last visit reviewed. Allergies and medications reviewed and updated.  Review of Systems  Constitutional: Negative for activity change, appetite change, diaphoresis, fatigue and fever.  Respiratory: Negative for cough, chest tightness and shortness of breath.   Cardiovascular: Negative for chest pain, palpitations and leg swelling.  Gastrointestinal: Negative for abdominal distention, abdominal pain, constipation, diarrhea, nausea and vomiting.  Endocrine: Negative for cold intolerance, heat intolerance, polydipsia, polyphagia and polyuria.  Neurological: Negative for dizziness, syncope, weakness, light-headedness, numbness and headaches.  Psychiatric/Behavioral: Negative.     Per HPI unless specifically indicated above     Objective:    BP 125/66   Pulse 64   Temp 98 F (36.7 C) (Oral)  Ht 5\' 1"  (1.549 m)   Wt 197 lb (89.4 kg)   SpO2 96%   BMI 37.22 kg/m   Wt  Readings from Last 3 Encounters:  04/08/18 197 lb (89.4 kg)  12/05/17 202 lb 12.8 oz (92 kg)  10/02/17 208 lb 4.8 oz (94.5 kg)    Physical Exam Vitals signs and nursing note reviewed.  Constitutional:      General: She is awake.     Appearance: She is well-developed. She is obese.  HENT:     Head: Normocephalic.     Right Ear: Hearing normal.     Left Ear: Hearing normal.     Nose: Nose normal.     Mouth/Throat:     Mouth: Mucous membranes are moist.  Eyes:     General: Lids are normal.        Right eye: No discharge.        Left eye: No discharge.     Conjunctiva/sclera: Conjunctivae normal.     Pupils: Pupils are equal, round, and reactive to light.  Neck:     Musculoskeletal: Normal range of motion and neck supple.     Thyroid: No thyromegaly.     Vascular: No carotid bruit or JVD.  Cardiovascular:     Rate and Rhythm: Normal rate and regular rhythm.     Heart sounds: Normal heart sounds. No murmur. No gallop.   Pulmonary:     Effort: Pulmonary effort is normal.     Breath sounds: Normal breath sounds.  Abdominal:     General: Bowel sounds are normal.     Palpations: Abdomen is soft. There is no hepatomegaly or splenomegaly.  Musculoskeletal:     Right lower leg: No edema.     Left lower leg: No edema.  Lymphadenopathy:     Cervical: No cervical adenopathy.  Skin:    General: Skin is warm and dry.  Neurological:     Mental Status: She is alert and oriented to person, place, and time.  Psychiatric:        Attention and Perception: Attention normal.        Mood and Affect: Mood normal.        Behavior: Behavior normal. Behavior is cooperative.        Thought Content: Thought content normal.        Judgment: Judgment normal.     Results for orders placed or performed in visit on 12/03/17  Iron and TIBC  Result Value Ref Range   Iron 57 28 - 170 ug/dL   TIBC 363 250 - 450 ug/dL   Saturation Ratios 16 10.4 - 31.8 %   UIBC 306 ug/dL  Ferritin  Result Value  Ref Range   Ferritin 201 11 - 307 ng/mL  Comprehensive metabolic panel  Result Value Ref Range   Sodium 138 135 - 145 mmol/L   Potassium 4.6 3.5 - 5.1 mmol/L   Chloride 100 98 - 111 mmol/L   CO2 27 22 - 32 mmol/L   Glucose, Bld 144 (H) 70 - 99 mg/dL   BUN 33 (H) 8 - 23 mg/dL   Creatinine, Ser 1.68 (H) 0.44 - 1.00 mg/dL   Calcium 9.5 8.9 - 10.3 mg/dL   Total Protein 7.8 6.5 - 8.1 g/dL   Albumin 4.5 3.5 - 5.0 g/dL   AST 43 (H) 15 - 41 U/L   ALT 35 0 - 44 U/L   Alkaline Phosphatase 67 38 - 126 U/L   Total Bilirubin  0.7 0.3 - 1.2 mg/dL   GFR calc non Af Amer 30 (L) >60 mL/min   GFR calc Af Amer 35 (L) >60 mL/min   Anion gap 11 5 - 15  CBC with Differential  Result Value Ref Range   WBC 7.6 4.0 - 10.5 K/uL   RBC 3.68 (L) 3.87 - 5.11 MIL/uL   Hemoglobin 11.9 (L) 12.0 - 15.0 g/dL   HCT 35.4 (L) 36.0 - 46.0 %   MCV 96.2 80.0 - 100.0 fL   MCH 32.3 26.0 - 34.0 pg   MCHC 33.6 30.0 - 36.0 g/dL   RDW 12.3 11.5 - 15.5 %   Platelets 128 (L) 150 - 400 K/uL   nRBC 0.0 0.0 - 0.2 %   Neutrophils Relative % 65 %   Neutro Abs 4.9 1.7 - 7.7 K/uL   Lymphocytes Relative 19 %   Lymphs Abs 1.4 0.7 - 4.0 K/uL   Monocytes Relative 11 %   Monocytes Absolute 0.9 0.1 - 1.0 K/uL   Eosinophils Relative 5 %   Eosinophils Absolute 0.4 0.0 - 0.5 K/uL   Basophils Relative 0 %   Basophils Absolute 0.0 0.0 - 0.1 K/uL   Immature Granulocytes 0 %   Abs Immature Granulocytes 0.02 0.00 - 0.07 K/uL      Assessment & Plan:   Problem List Items Addressed This Visit      Cardiovascular and Mediastinum   Chronic diastolic heart failure (HCC)    Chronic, ongoing.  Continue collaboration with Dr. Humphrey Rolls, has upcoming follow-up.      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  Continue current medication regimen and collaboration with Dr. Humphrey Rolls.  CMP today.       Relevant Orders   Comprehensive metabolic panel     Digestive    GERD with stricture   Relevant Orders   Magnesium     Endocrine   Hyperlipidemia associated with type 2 diabetes mellitus (HCC)    Chronic, ongoing.  Continue current medication regimen.  Lipid panel today.      Relevant Orders   Lipid Panel w/o Chol/HDL Ratio   Type 2 diabetes mellitus with stage 3 chronic kidney disease, without long-term current use of insulin (HCC) - Primary    Chronic, ongoing.  Continue current medication regimen, Januvia renal dosed at this time.  A1C today 6.7%.        Relevant Orders   Bayer DCA Hb A1c Waived   Hypothyroid    Chronic, ongoing.  Continue current Levothyroxine dose and adjust as needed based on lab results.  Thyroid panel today.      Relevant Orders   Thyroid Panel With TSH     Genitourinary   Chronic kidney disease, stage 3 (HCC)    Chronic, ongoing.  Last GFR 30.  Will recheck CMP today.  Discussed at length nephrology consult if continued decline noted on labs, she agrees with this plan of care.  Reviewed medication regimen to ensure renal dosing present.  Recommended to avoid medications containing Ibuprofen.        Other   Obesity    Recommend regular physical activity (30 minutes 5 days a week) and healthy diet choices.          Follow up plan: Return in about 3 months (around 07/07/2018) for T2DM.

## 2018-04-09 ENCOUNTER — Other Ambulatory Visit: Payer: Self-pay | Admitting: Nurse Practitioner

## 2018-04-09 DIAGNOSIS — N184 Chronic kidney disease, stage 4 (severe): Secondary | ICD-10-CM

## 2018-04-09 LAB — COMPREHENSIVE METABOLIC PANEL
ALBUMIN: 4.9 g/dL — AB (ref 3.8–4.8)
ALT: 36 IU/L — ABNORMAL HIGH (ref 0–32)
AST: 50 IU/L — ABNORMAL HIGH (ref 0–40)
Albumin/Globulin Ratio: 2 (ref 1.2–2.2)
Alkaline Phosphatase: 73 IU/L (ref 39–117)
BUN / CREAT RATIO: 18 (ref 12–28)
BUN: 41 mg/dL — ABNORMAL HIGH (ref 8–27)
Bilirubin Total: 0.4 mg/dL (ref 0.0–1.2)
CO2: 25 mmol/L (ref 20–29)
Calcium: 10.2 mg/dL (ref 8.7–10.3)
Chloride: 98 mmol/L (ref 96–106)
Creatinine, Ser: 2.32 mg/dL — ABNORMAL HIGH (ref 0.57–1.00)
GFR calc Af Amer: 24 mL/min/{1.73_m2} — ABNORMAL LOW (ref >59)
GFR calc non Af Amer: 21 mL/min/{1.73_m2} — ABNORMAL LOW (ref >59)
GLOBULIN, TOTAL: 2.5 g/dL (ref 1.5–4.5)
GLUCOSE: 149 mg/dL — AB (ref 65–99)
Potassium: 4.8 mmol/L (ref 3.5–5.2)
Sodium: 143 mmol/L (ref 134–144)
Total Protein: 7.4 g/dL (ref 6.0–8.5)

## 2018-04-09 LAB — LIPID PANEL W/O CHOL/HDL RATIO
Cholesterol, Total: 139 mg/dL (ref 100–199)
HDL: 68 mg/dL (ref >39)
LDL Calculated: 54 mg/dL (ref 0–99)
Triglycerides: 85 mg/dL (ref 0–149)
VLDL Cholesterol Cal: 17 mg/dL (ref 5–40)

## 2018-04-09 LAB — THYROID PANEL WITH TSH
Free Thyroxine Index: 2.4 (ref 1.2–4.9)
T3 UPTAKE RATIO: 30 % (ref 24–39)
T4 TOTAL: 7.9 ug/dL (ref 4.5–12.0)
TSH: 0.832 u[IU]/mL (ref 0.450–4.500)

## 2018-04-09 LAB — MAGNESIUM: Magnesium: 2 mg/dL (ref 1.6–2.3)

## 2018-04-09 MED ORDER — SITAGLIPTIN PHOSPHATE 25 MG PO TABS: 25.0000 mg | ORAL_TABLET | Freq: Every day | ORAL | 3 refills | Status: DC

## 2018-04-09 MED ORDER — FUROSEMIDE 20 MG PO TABS: 20.0000 mg | ORAL_TABLET | Freq: Every day | ORAL | 3 refills | Status: DC

## 2018-04-09 NOTE — Progress Notes (Signed)
Reviewed recent labs with patient via telephone.  Her kidney function has decline from previous exam 4 months ago GFR from 30 to 21 and CRT 1.68 to 2.32.  At visit 4 months ago her Januvia had been reduced to 50 MG.  She is followed by Dr. Chancy Milroy for cardiology and sees him next month.  Has T2DM, HTN, HLD, CHF.  Discussed at length with her current kidney function and need to adjust medications. She recently filled her Olmesartan-Amlodipine-HCTZ.  Will change her Januvia to 25 MG daily and decrease Lasix to 20 MG daily (no edema on exam yesterday and BP stable).  She agrees to nephrology referral.  Will place this.  Is aware further medication adjustments will probably be needed, possible d/c of HCTZ.  Discussed chronic kidney disease with patient.  Recommend adequate fluid intake at home and no use of any medications containing Ibuprofen.  She was able to verbalize all of this back to provider.  Discussed elevation in AST/ALT, will continue to monitor this.  Recommend minimal use of Tylenol and to use OTC creams as needed if pain present.

## 2018-04-11 ENCOUNTER — Other Ambulatory Visit: Payer: Self-pay | Admitting: Nurse Practitioner

## 2018-04-27 DIAGNOSIS — I340 Nonrheumatic mitral (valve) insufficiency: Secondary | ICD-10-CM | POA: Diagnosis not present

## 2018-04-27 DIAGNOSIS — E782 Mixed hyperlipidemia: Secondary | ICD-10-CM | POA: Diagnosis not present

## 2018-04-27 DIAGNOSIS — I10 Essential (primary) hypertension: Secondary | ICD-10-CM | POA: Diagnosis not present

## 2018-04-27 DIAGNOSIS — I2510 Atherosclerotic heart disease of native coronary artery without angina pectoris: Secondary | ICD-10-CM | POA: Diagnosis not present

## 2018-04-27 DIAGNOSIS — R0602 Shortness of breath: Secondary | ICD-10-CM | POA: Diagnosis not present

## 2018-05-10 ENCOUNTER — Other Ambulatory Visit: Payer: Self-pay | Admitting: Unknown Physician Specialty

## 2018-06-03 ENCOUNTER — Inpatient Hospital Stay: Payer: Medicare Other

## 2018-06-05 ENCOUNTER — Ambulatory Visit: Payer: PRIVATE HEALTH INSURANCE | Admitting: Internal Medicine

## 2018-06-05 ENCOUNTER — Ambulatory Visit: Payer: PRIVATE HEALTH INSURANCE

## 2018-06-11 ENCOUNTER — Ambulatory Visit: Payer: Medicare Other | Admitting: Internal Medicine

## 2018-07-07 ENCOUNTER — Ambulatory Visit: Payer: Medicare Other | Admitting: Nurse Practitioner

## 2018-07-08 ENCOUNTER — Other Ambulatory Visit: Payer: Self-pay | Admitting: Nurse Practitioner

## 2018-07-31 ENCOUNTER — Telehealth: Payer: Self-pay | Admitting: Internal Medicine

## 2018-07-31 NOTE — Telephone Encounter (Signed)
Called pt for appt pre-screen but got no answer. Left vm msg about screening and new guidelines about mask req, no visitors, screening questions they will be asked, and fever checks °

## 2018-08-03 ENCOUNTER — Inpatient Hospital Stay: Payer: PRIVATE HEALTH INSURANCE | Attending: Internal Medicine

## 2018-08-03 ENCOUNTER — Other Ambulatory Visit: Payer: Self-pay

## 2018-08-03 ENCOUNTER — Encounter (INDEPENDENT_AMBULATORY_CARE_PROVIDER_SITE_OTHER): Payer: Self-pay

## 2018-08-03 DIAGNOSIS — Z7982 Long term (current) use of aspirin: Secondary | ICD-10-CM | POA: Insufficient documentation

## 2018-08-03 DIAGNOSIS — E669 Obesity, unspecified: Secondary | ICD-10-CM | POA: Diagnosis not present

## 2018-08-03 DIAGNOSIS — M1990 Unspecified osteoarthritis, unspecified site: Secondary | ICD-10-CM | POA: Diagnosis not present

## 2018-08-03 DIAGNOSIS — I129 Hypertensive chronic kidney disease with stage 1 through stage 4 chronic kidney disease, or unspecified chronic kidney disease: Secondary | ICD-10-CM | POA: Insufficient documentation

## 2018-08-03 DIAGNOSIS — K449 Diaphragmatic hernia without obstruction or gangrene: Secondary | ICD-10-CM | POA: Diagnosis not present

## 2018-08-03 DIAGNOSIS — M8580 Other specified disorders of bone density and structure, unspecified site: Secondary | ICD-10-CM | POA: Diagnosis not present

## 2018-08-03 DIAGNOSIS — K219 Gastro-esophageal reflux disease without esophagitis: Secondary | ICD-10-CM | POA: Insufficient documentation

## 2018-08-03 DIAGNOSIS — I2510 Atherosclerotic heart disease of native coronary artery without angina pectoris: Secondary | ICD-10-CM | POA: Insufficient documentation

## 2018-08-03 DIAGNOSIS — E7800 Pure hypercholesterolemia, unspecified: Secondary | ICD-10-CM | POA: Insufficient documentation

## 2018-08-03 DIAGNOSIS — Z7901 Long term (current) use of anticoagulants: Secondary | ICD-10-CM | POA: Diagnosis not present

## 2018-08-03 DIAGNOSIS — I509 Heart failure, unspecified: Secondary | ICD-10-CM | POA: Insufficient documentation

## 2018-08-03 DIAGNOSIS — N183 Chronic kidney disease, stage 3 (moderate): Secondary | ICD-10-CM | POA: Diagnosis not present

## 2018-08-03 DIAGNOSIS — M129 Arthropathy, unspecified: Secondary | ICD-10-CM | POA: Insufficient documentation

## 2018-08-03 DIAGNOSIS — R531 Weakness: Secondary | ICD-10-CM | POA: Insufficient documentation

## 2018-08-03 DIAGNOSIS — E039 Hypothyroidism, unspecified: Secondary | ICD-10-CM | POA: Diagnosis not present

## 2018-08-03 DIAGNOSIS — D696 Thrombocytopenia, unspecified: Secondary | ICD-10-CM | POA: Insufficient documentation

## 2018-08-03 DIAGNOSIS — Z79899 Other long term (current) drug therapy: Secondary | ICD-10-CM | POA: Diagnosis not present

## 2018-08-03 DIAGNOSIS — R5383 Other fatigue: Secondary | ICD-10-CM | POA: Diagnosis not present

## 2018-08-03 DIAGNOSIS — D509 Iron deficiency anemia, unspecified: Secondary | ICD-10-CM | POA: Insufficient documentation

## 2018-08-03 DIAGNOSIS — D500 Iron deficiency anemia secondary to blood loss (chronic): Secondary | ICD-10-CM

## 2018-08-03 DIAGNOSIS — E119 Type 2 diabetes mellitus without complications: Secondary | ICD-10-CM | POA: Diagnosis not present

## 2018-08-03 LAB — COMPREHENSIVE METABOLIC PANEL
ALT: 21 U/L (ref 0–44)
AST: 27 U/L (ref 15–41)
Albumin: 4.5 g/dL (ref 3.5–5.0)
Alkaline Phosphatase: 54 U/L (ref 38–126)
Anion gap: 11 (ref 5–15)
BUN: 21 mg/dL (ref 8–23)
CO2: 23 mmol/L (ref 22–32)
Calcium: 9.4 mg/dL (ref 8.9–10.3)
Chloride: 104 mmol/L (ref 98–111)
Creatinine, Ser: 1.47 mg/dL — ABNORMAL HIGH (ref 0.44–1.00)
GFR calc Af Amer: 41 mL/min — ABNORMAL LOW (ref >60)
GFR calc non Af Amer: 36 mL/min — ABNORMAL LOW (ref >60)
Glucose, Bld: 129 mg/dL — ABNORMAL HIGH (ref 70–99)
Potassium: 4.3 mmol/L (ref 3.5–5.1)
Sodium: 138 mmol/L (ref 135–145)
Total Bilirubin: 0.9 mg/dL (ref 0.3–1.2)
Total Protein: 7.8 g/dL (ref 6.5–8.1)

## 2018-08-03 LAB — CBC WITH DIFFERENTIAL/PLATELET
Abs Immature Granulocytes: 0.04 10*3/uL (ref 0.00–0.07)
Basophils Absolute: 0 10*3/uL (ref 0.0–0.1)
Basophils Relative: 0 %
Eosinophils Absolute: 0.4 10*3/uL (ref 0.0–0.5)
Eosinophils Relative: 5 %
HCT: 33.4 % — ABNORMAL LOW (ref 36.0–46.0)
Hemoglobin: 11.2 g/dL — ABNORMAL LOW (ref 12.0–15.0)
Immature Granulocytes: 1 %
Lymphocytes Relative: 16 %
Lymphs Abs: 1 10*3/uL (ref 0.7–4.0)
MCH: 32.7 pg (ref 26.0–34.0)
MCHC: 33.5 g/dL (ref 30.0–36.0)
MCV: 97.7 fL (ref 80.0–100.0)
Monocytes Absolute: 0.8 10*3/uL (ref 0.1–1.0)
Monocytes Relative: 11 %
Neutro Abs: 4.4 10*3/uL (ref 1.7–7.7)
Neutrophils Relative %: 67 %
Platelets: 98 10*3/uL — ABNORMAL LOW (ref 150–400)
RBC: 3.42 MIL/uL — ABNORMAL LOW (ref 3.87–5.11)
RDW: 13.4 % (ref 11.5–15.5)
WBC: 6.6 10*3/uL (ref 4.0–10.5)
nRBC: 0 % (ref 0.0–0.2)

## 2018-08-03 LAB — IRON AND TIBC
Iron: 79 ug/dL (ref 28–170)
Saturation Ratios: 20 % (ref 10.4–31.8)
TIBC: 401 ug/dL (ref 250–450)
UIBC: 322 ug/dL

## 2018-08-03 LAB — FERRITIN: Ferritin: 95 ng/mL (ref 11–307)

## 2018-08-06 ENCOUNTER — Other Ambulatory Visit: Payer: Self-pay

## 2018-08-06 ENCOUNTER — Inpatient Hospital Stay (HOSPITAL_BASED_OUTPATIENT_CLINIC_OR_DEPARTMENT_OTHER): Payer: PRIVATE HEALTH INSURANCE | Admitting: Internal Medicine

## 2018-08-06 ENCOUNTER — Inpatient Hospital Stay: Payer: PRIVATE HEALTH INSURANCE

## 2018-08-06 ENCOUNTER — Encounter: Payer: Self-pay | Admitting: Internal Medicine

## 2018-08-06 VITALS — BP 134/85 | HR 62 | Temp 97.2°F | Resp 20

## 2018-08-06 DIAGNOSIS — I2510 Atherosclerotic heart disease of native coronary artery without angina pectoris: Secondary | ICD-10-CM

## 2018-08-06 DIAGNOSIS — M1990 Unspecified osteoarthritis, unspecified site: Secondary | ICD-10-CM | POA: Diagnosis not present

## 2018-08-06 DIAGNOSIS — D500 Iron deficiency anemia secondary to blood loss (chronic): Secondary | ICD-10-CM

## 2018-08-06 DIAGNOSIS — D509 Iron deficiency anemia, unspecified: Secondary | ICD-10-CM

## 2018-08-06 DIAGNOSIS — K449 Diaphragmatic hernia without obstruction or gangrene: Secondary | ICD-10-CM

## 2018-08-06 DIAGNOSIS — E669 Obesity, unspecified: Secondary | ICD-10-CM

## 2018-08-06 DIAGNOSIS — R5383 Other fatigue: Secondary | ICD-10-CM

## 2018-08-06 DIAGNOSIS — E7800 Pure hypercholesterolemia, unspecified: Secondary | ICD-10-CM

## 2018-08-06 DIAGNOSIS — I129 Hypertensive chronic kidney disease with stage 1 through stage 4 chronic kidney disease, or unspecified chronic kidney disease: Secondary | ICD-10-CM

## 2018-08-06 DIAGNOSIS — E119 Type 2 diabetes mellitus without complications: Secondary | ICD-10-CM | POA: Diagnosis not present

## 2018-08-06 DIAGNOSIS — M129 Arthropathy, unspecified: Secondary | ICD-10-CM | POA: Diagnosis not present

## 2018-08-06 DIAGNOSIS — K219 Gastro-esophageal reflux disease without esophagitis: Secondary | ICD-10-CM

## 2018-08-06 DIAGNOSIS — E039 Hypothyroidism, unspecified: Secondary | ICD-10-CM

## 2018-08-06 DIAGNOSIS — M8580 Other specified disorders of bone density and structure, unspecified site: Secondary | ICD-10-CM

## 2018-08-06 DIAGNOSIS — N183 Chronic kidney disease, stage 3 unspecified: Secondary | ICD-10-CM

## 2018-08-06 DIAGNOSIS — Z79899 Other long term (current) drug therapy: Secondary | ICD-10-CM

## 2018-08-06 DIAGNOSIS — N184 Chronic kidney disease, stage 4 (severe): Secondary | ICD-10-CM | POA: Insufficient documentation

## 2018-08-06 DIAGNOSIS — Z7901 Long term (current) use of anticoagulants: Secondary | ICD-10-CM

## 2018-08-06 DIAGNOSIS — I509 Heart failure, unspecified: Secondary | ICD-10-CM

## 2018-08-06 DIAGNOSIS — D696 Thrombocytopenia, unspecified: Secondary | ICD-10-CM

## 2018-08-06 DIAGNOSIS — D631 Anemia in chronic kidney disease: Secondary | ICD-10-CM | POA: Insufficient documentation

## 2018-08-06 DIAGNOSIS — R531 Weakness: Secondary | ICD-10-CM | POA: Diagnosis not present

## 2018-08-06 DIAGNOSIS — Z7982 Long term (current) use of aspirin: Secondary | ICD-10-CM

## 2018-08-06 MED ORDER — SODIUM CHLORIDE 0.9 % IV SOLN
Freq: Once | INTRAVENOUS | Status: AC
  Administered 2018-08-06: 12:00:00 via INTRAVENOUS
  Filled 2018-08-06: qty 250

## 2018-08-06 MED ORDER — SODIUM CHLORIDE 0.9 % IV SOLN
510.0000 mg | Freq: Once | INTRAVENOUS | Status: AC
  Administered 2018-08-06: 510 mg via INTRAVENOUS
  Filled 2018-08-06: qty 17

## 2018-08-06 NOTE — Progress Notes (Signed)
[place] NOTE  Patient Care Team: Venita Lick, NP as PCP - General (Nurse Practitioner) Dionisio David, MD as Consulting Physician (Cardiology) Cammie Sickle, MD as Consulting Physician (Internal Medicine)  CHIEF COMPLAINTS/PURPOSE OF CONSULTATION:   # 2014- Iron def Anemia-  [Sep 2014 EGD/COLO- NEG/ferritin 6; Dr.Wohl]. 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   # Hx CHF/CAD- asprin-plavix; Sep 2018- s/p hiatal hernia-fundoplication [Dr.Pabon]  HISTORY OF PRESENTING ILLNESS:  Erica Larsen 68 y.o.  female patient with above history of iron deficiency anemia of unclear etiology is here status post IV iron in November 2016 is here for follow-up.   Patient denies any blood in stools or black or stools.  Denies any nausea vomiting.  No weight loss.  She feels tired.  .Review of Systems  Constitutional: Positive for malaise/fatigue. Negative for chills, diaphoresis, fever and weight loss.  HENT: Negative for nosebleeds and sore throat.   Eyes: Negative for double vision.  Respiratory: Negative for cough, hemoptysis, sputum production, shortness of breath and wheezing.   Cardiovascular: Negative for chest pain, palpitations, orthopnea and leg swelling.  Gastrointestinal: Negative for abdominal pain, blood in stool, constipation, diarrhea, heartburn, melena, nausea and vomiting.  Genitourinary: Negative for dysuria, frequency and urgency.  Musculoskeletal: Negative for back pain and joint pain.  Skin: Negative.  Negative for itching and rash.  Neurological: Negative for dizziness, tingling, focal weakness, weakness and headaches.  Endo/Heme/Allergies: Does not bruise/bleed easily.  Psychiatric/Behavioral: Negative for depression. The patient is not nervous/anxious and does not have insomnia.      MEDICAL HISTORY:  Past Medical  History:  Diagnosis Date  . Anemia 2017  . Arthritis    osteoarthritis  . CHF (congestive heart failure) ([place])   . Chronic kidney disease 2017   stage 3  . Coronary artery disease   . Diabetes mellitus without complication ([place])   . Diverticulosis   . Esophagitis   . GERD (gastroesophageal reflux disease)   . Hiatal hernia   . High cholesterol   . Hypercalcemia   . Hypertension   . Hypothyroidism   . Lumbar radiculopathy   . Obesity   . Osteopenia    Hips  . Thyroid disease     SURGICAL HISTORY: Past Surgical History:  Procedure Laterality Date  . CATARACT EXTRACTION Bilateral    May - June 2018  . COLONOSCOPY WITH PROPOFOL N/A 08/01/2015   Procedure: COLONOSCOPY WITH PROPOFOL;  Surgeon: Lucilla Lame, MD;  Location: [facility];  Service: Endoscopy;  Laterality: N/A;  . CORONARY ANGIOPLASTY WITH STENT PLACEMENT    . ESOPHAGOGASTRODUODENOSCOPY (EGD) WITH PROPOFOL N/A 05/14/2016   Procedure: ESOPHAGOGASTRODUODENOSCOPY (EGD) WITH PROPOFOL;  Surgeon: Lucilla Lame, MD;  Location: [facility];  Service: Endoscopy;  Laterality: N/A;  . GIVENS CAPSULE STUDY N/A 06/01/2015   Procedure: GIVENS CAPSULE STUDY;  Surgeon: Lucilla Lame, MD;  Location: [facility];  Service: Endoscopy;  Laterality: N/A;  . HIATAL HERNIA REPAIR N/A 09/24/2016   Procedure: LAPAROSCOPIC REPAIR OF HIATAL HERNIA WITH FUNDOPLICATION WITH MESH (BIO-A);  Surgeon: Jules Husbands, MD;  Location: [facility] ORS;  Service: General;  Laterality: N/A;  Lithotomy position w arms tucked.   . pre cancer removed     left forearm  . squamous cell carcinoma surgery      SOCIAL HISTORY: Social History   Socioeconomic History  . Marital status: Married    Spouse name: Not on file  .  Number of children: Not on file  . Years of education: Not on file  . Highest education level: Not on file  Occupational History  . Not on file  Social Needs  . Financial resource strain: Not on file  . Food insecurity    Worry: Not on  file    Inability: Not on file  . Transportation needs    Medical: Not on file    Non-medical: Not on file  Tobacco Use  . Smoking status: Never Smoker  . Smokeless tobacco: Never Used  Substance and Sexual Activity  . Alcohol use: No  . Drug use: No  . Sexual activity: Yes    Birth control/protection: Post-menopausal  Lifestyle  . Physical activity    Days per week: Not on file    Minutes per session: Not on file  . Stress: Not on file  Relationships  . Social Herbalist on phone: Not on file    Gets together: Not on file    Attends religious service: Not on file    Active member of club or organization: Not on file    Attends meetings of clubs or organizations: Not on file    Relationship status: Not on file  . Intimate partner violence    Fear of current or ex partner: Not on file    Emotionally abused: Not on file    Physically abused: Not on file    Forced sexual activity: Not on file  Other Topics Concern  . Not on file  Social History Narrative  . Not on file    FAMILY HISTORY: Family History  Problem Relation Age of Onset  . Heart disease Mother   . Kidney failure Mother   . Hypertension Mother   . Diabetes Mother   . Heart attack Father   . Hypertension Father   . Thyroid disease Father   . Hypertension Sister   . Diabetes Sister   . Thyroid disease Daughter   . Thyroid disease Daughter     ALLERGIES:  is allergic to sulfa antibiotics and latex.  MEDICATIONS:  Current Outpatient Medications  Medication Sig Dispense Refill  . amiodarone (PACERONE) 200 MG tablet Take 2 tablets by mouth daily.    [name] aspirin EC 81 MG tablet Take 81 mg by mouth daily after breakfast.    . atorvastatin (LIPITOR) 80 MG tablet Take 80 mg by mouth daily after breakfast.   0  . Ferrous Sulfate (SLOW FE PO) Take 1 tablet by mouth daily.    . furosemide (LASIX) 20 MG tablet Take 1 tablet (20 mg total) by mouth daily. 30 tablet 3  . hydrALAZINE (APRESOLINE) 25 MG  tablet Take 1 tablet by mouth daily.    . lansoprazole (PREVACID) 30 MG capsule TAKE 1 CAPSULE BY MOUTH EVERY DAY AT NOON 30 capsule 1  . levothyroxine (SYNTHROID, LEVOTHROID) 112 MCG tablet TAKE 1 TABLET(112 MCG) BY MOUTH DAILY 90 tablet 0  . metoprolol succinate (TOPROL-XL) 100 MG 24 hr tablet Take 1 tablet by mouth daily.    . Olmesartan-amLODIPine-HCTZ 40-5-12.5 MG TABS Take one tablet daily. 90 tablet   . Polyethyl Glycol-Propyl Glycol (SYSTANE ULTRA OP) Place 1 drop into both eyes 2 (two) times daily.     . raloxifene (EVISTA) 60 MG tablet TAKE 1 TABLET(60 MG) BY MOUTH DAILY 90 tablet 2  . sitaGLIPtin (JANUVIA) 25 MG tablet Take 1 tablet (25 mg total) by mouth daily. 90 tablet 3  . triamcinolone cream (KENALOG)  0.1 % apply to affected area twice a day AVOID FACE  0  . XARELTO 20 MG TABS tablet Take 1 tablet by mouth daily.     No current facility-administered medications for this visit.       [name]  PHYSICAL EXAMINATION: ECOG PERFORMANCE STATUS: 1 - Symptomatic but completely ambulatory  Vitals:   08/06/18 1130  BP: 132/83  Pulse: 60  Resp: 20  Temp: (!) 97.3 F (36.3 C)   Filed Weights   08/06/18 1126  Weight: 199 lb 9.6 oz (90.5 kg)    Physical Exam  Constitutional: She is oriented to person, place, and time and well-developed, well-nourished, and in no distress.  HENT:  Head: Normocephalic and atraumatic.  Mouth/Throat: Oropharynx is clear and moist. No oropharyngeal exudate.  Eyes: Pupils are equal, round, and reactive to light.  Neck: Normal range of motion. Neck supple.  Cardiovascular: Normal rate and regular rhythm.  Pulmonary/Chest: No respiratory distress. She has no wheezes.  Abdominal: Soft. Bowel sounds are normal. She exhibits no distension and no mass. There is no abdominal tenderness. There is no rebound and no guarding.  Musculoskeletal: Normal range of motion.        General: No tenderness or edema.  Neurological: She is alert and oriented to person,  place, and time.  Skin: Skin is warm.  Psychiatric: Affect normal.     LABORATORY DATA:  I have reviewed the data as listed Lab Results  Component Value Date   WBC 6.6 08/03/2018   HGB 11.2 (L) 08/03/2018   HCT 33.4 (L) 08/03/2018   MCV 97.7 08/03/2018   PLT 98 (L) 08/03/2018   Recent Labs    12/03/17 1134 04/08/18 1111 08/03/18 1137  NA 138 143 138  K 4.6 4.8 4.3  CL 100 98 104  CO2 27 25 23   GLUCOSE 144* 149* 129*  BUN 33* 41* 21  CREATININE 1.68* 2.32* 1.47*  CALCIUM 9.5 10.2 9.4  GFRNONAA 30* 21* 36*  GFRAA 35* 24* 41*  PROT 7.8 7.4 7.8  ALBUMIN 4.5 4.9* 4.5  AST 43* 50* 27  ALT 35 36* 21  ALKPHOS 67 73 54  BILITOT 0.7 0.4 0.9     ASSESSMENT & PLAN:  Iron deficiency anemia due to chronic blood loss #  Anemia of chronic kidney failure, stage 3 (moderate) (HCC)  Iron deficiency anemia- unclear etiology; question GI blood loss/CKD [hx large hiatal hernia status post recent fundoplication]   # Today hemoglobin is 11.2;  iron sat- 20%; recommend compliance  PO iron day. Proceed with infusion today.   # CKD-III [creat 1.4] sec to DM- Stable.   # Mild thrombocytopenia- intermittent- 98; monitor for now.   # DISPOSITION:   #  IV Ferrahem today.  # follow up in 6 months/ MD/possible IV ferreham labs- cbc/bmp/iron/ ferritin few days prior    Cammie Sickle, MD 08/08/2018 12:10 AM

## 2018-08-06 NOTE — Assessment & Plan Note (Signed)
Iron deficiency anemia- unclear etiology; question GI blood loss/CKD [hx large hiatal hernia status post recent fundoplication]   # Today hemoglobin is 11.2;  iron sat- 20%; recommend compliance  PO iron day. Proceed with infusion today.   # CKD-III [creat 1.4] sec to DM- Stable.   # Mild thrombocytopenia- intermittent- 98; monitor for now.   # DISPOSITION:   #  IV Ferrahem today.  # follow up in 6 months/ MD/possible IV ferreham labs- cbc/bmp/iron/ ferritin few days prior

## 2018-08-13 ENCOUNTER — Ambulatory Visit: Payer: Self-pay | Admitting: Nurse Practitioner

## 2018-08-13 NOTE — Telephone Encounter (Signed)
Pt. Complaining or fevers at night since Tuesday. None during the day. Has a congested cough. Has chills when she has the fevers. Warm transfer to [facility] in the practice.  Answer Assessment - Initial Assessment Questions 1. COVID-19 DIAGNOSIS: "Who made your Coronavirus (COVID-19) diagnosis?" "Was it confirmed by a positive lab test?" If not diagnosed by a HCP, ask "Are there lots of cases (community spread) where you live?" (See public health department website, if unsure)     No 2. ONSET: "When did the COVID-19 symptoms start?"      Tuesday 3. WORST SYMPTOM: "What is your worst symptom?" (e.g., cough, fever, shortness of breath, muscle aches)     Fever at night 4. COUGH: "Do you have a cough?" If so, ask: "How bad is the cough?"       Yes 5. FEVER: "Do you have a fever?" If so, ask: "What is your temperature, how was it measured, and when did it start?"     101 last night 6. RESPIRATORY STATUS: "Describe your breathing?" (e.g., shortness of breath, wheezing, unable to speak)      No 7. BETTER-SAME-WORSE: "Are you getting better, staying the same or getting worse compared to yesterday?"  If getting worse, ask, "In what way?"     Better 8. HIGH RISK DISEASE: "Do you have any chronic medical problems?" (e.g., asthma, heart or lung disease, weak immune system, etc.)     Heart disease, kidney disease 9. PREGNANCY: "Is there any chance you are pregnant?" "When was your last menstrual period?"     No 10. OTHER SYMPTOMS: "Do you have any other symptoms?"  (e.g., chills, fatigue, headache, loss of smell or taste, muscle pain, sore throat)       Chills  Protocols used: CORONAVIRUS (COVID-19) DIAGNOSED OR SUSPECTED-A-AH

## 2018-08-13 NOTE — Telephone Encounter (Signed)
Got it.

## 2018-08-14 ENCOUNTER — Telehealth: Payer: Self-pay | Admitting: *Deleted

## 2018-08-14 ENCOUNTER — Encounter: Payer: Self-pay | Admitting: Nurse Practitioner

## 2018-08-14 ENCOUNTER — Other Ambulatory Visit: Payer: PRIVATE HEALTH INSURANCE

## 2018-08-14 ENCOUNTER — Other Ambulatory Visit: Payer: Self-pay

## 2018-08-14 ENCOUNTER — Ambulatory Visit (INDEPENDENT_AMBULATORY_CARE_PROVIDER_SITE_OTHER): Payer: PRIVATE HEALTH INSURANCE | Admitting: Nurse Practitioner

## 2018-08-14 DIAGNOSIS — Z20822 Contact with and (suspected) exposure to covid-19: Secondary | ICD-10-CM

## 2018-08-14 DIAGNOSIS — R509 Fever, unspecified: Secondary | ICD-10-CM | POA: Diagnosis not present

## 2018-08-14 DIAGNOSIS — U071 COVID-19: Secondary | ICD-10-CM

## 2018-08-14 HISTORY — DX: COVID-19: U07.1

## 2018-08-14 NOTE — Telephone Encounter (Signed)
-----   Message from Venita Lick, NP sent at 08/14/2018  9:50 AM EDT ----- Name: Erica Larsen  DOB: 01-Sep-1950  MRN: [id number]  Insurance: Medicare  Reason: Cough x one week with fever + nasal rhinorrhea.  Would like Covid testing.  High risk due to obesity, T2DM, HTN, HF, CKD.  Would benefit from testing.

## 2018-08-14 NOTE — Patient Instructions (Signed)
This information is directly available on the CDC website: https://www.cdc.gov/coronavirus/2019-ncov/if-you-are-sick/steps-when-sick.html    Source:CDC Reference to specific commercial products, manufacturers, companies, or trademarks does not constitute its endorsement or recommendation by the U.S. Government, Department of Health and Human Services, or Centers for Disease Control and Prevention.  

## 2018-08-14 NOTE — Assessment & Plan Note (Addendum)
Acute with cough, reports this is improving.  Refuses imaging or face to face visit in ER, but agrees with worsening symptoms she will obtain these and has been advised to do so.   Message sent to [facility] for Covid testing.  Have recommended maintaining a 14 day at home quarantine at this time based on her current symptoms or maintaining quarantine until symptoms improve even if Covid testing remains negative.  Patient agrees with this POC.  May consider abx treatment if worsening symptoms. They will return in 2 weeks for follow-up.

## 2018-08-14 NOTE — Progress Notes (Signed)
BP 106/70 (BP Location: Left Arm, Patient Position: Sitting, Cuff Size: Normal)    Pulse 64    Temp 97.6 F (36.4 C) (Oral)    Ht 5\' 1"  (1.549 m)    Wt 195 lb (88.5 kg)    BMI 36.84 kg/m    Subjective:    Patient ID: Erica Larsen, female    DOB: March 23, 1950, 68 y.o.   MRN: [id number]  HPI: Erica Larsen is a 68 y.o. female  Chief Complaint  Patient presents with   Cough    Productive cough. Ongoing 1 week. Concerned about COVID19.    Fever    Ran a fever Tuesday, Wednesday and last night.   Nasal Congestion    This visit was completed via telephone due to the restrictions of the COVID-19 pandemic. All issues as above were discussed and addressed but no physical exam was performed. If it was felt that the patient should be evaluated in the office, they were directed there. The patient verbally consented to this visit. Patient was unable to complete an audio/visual visit due to Lack of equipment. Due to the catastrophic nature of the COVID-19 pandemic, this visit was done through audio contact only. Location of the patient: home Location of the provider: home Those involved with this call:  Provider: Marnee Guarneri, DNP CMA: Merilyn Baba, [place] Desk/Registration: Jill Side  Time spent on call: 15 minutes on the phone discussing health concerns. 10 minutes total spent in review of patient's record and preparation of their chart. I verified patient identity using two factors (patient name and date of birth). Patient consents verbally to being seen via telemedicine visit today.   UPPER RESPIRATORY TRACT INFECTION Started with productive cough last week, whitish.  Has had a fever, TMAX 101.3 Wednesday night. Fevers have been more at night, is not taking any medications.  She does not live by herself, lives with husband.  Has only been to doctor's office, most recent visit was [facility] for transfusion.  This visit was last Thursday.  Does not know of any Covid exposures that  she knows of and no recent travel.  Has been wearing mask "all the time" when out at offices.  She does feel symptoms are improving, but has concerns for Covid and would like testing.  Refuses chest x-ray or face to face visit in ER setting, but agrees to obtain these if worsening symptoms. Is to see her cardiologist on Tuesday, recommended that if symptoms remain present she not attend face to face visit but change this to virtual and alert her cardiologist.  Discussed at length quarantine. Worst symptom: cough Fever: yes Cough: yes Shortness of breath: no Wheezing: no Chest pain: no Chest tightness: no Chest congestion: yes Nasal congestion: no Runny nose: yes Post nasal drip: yes Sneezing: yes Sore throat: no Swollen glands: no Sinus pressure: no Headache: no Face pain: no Toothache: no Ear pain: none Ear pressure: none Eyes red/itching:no Eye drainage/crusting: no  Vomiting: no Rash: no Fatigue: yes Sick contacts: no Strep contacts: no  Context: better Recurrent sinusitis: no Relief with OTC cold/cough medications: no  Treatments attempted: none   Relevant past medical, surgical, family and social history reviewed and updated as indicated. Interim medical history since our last visit reviewed. Allergies and medications reviewed and updated.  Review of Systems  Constitutional: Positive for fatigue and fever. Negative for activity change, appetite change and diaphoresis.  HENT: Positive for postnasal drip, rhinorrhea and sneezing. Negative for congestion, ear  discharge, ear pain, facial swelling, sinus pressure, sinus pain, sore throat and voice change.   Eyes: Negative for pain and visual disturbance.  Respiratory: Positive for cough. Negative for chest tightness, shortness of breath and wheezing.   Cardiovascular: Negative for chest pain, palpitations and leg swelling.  Gastrointestinal: Negative for abdominal distention, abdominal pain, constipation, diarrhea, nausea  and vomiting.  Endocrine: Negative.   Musculoskeletal: Negative for myalgias.  Neurological: Negative for dizziness, numbness and headaches.  Psychiatric/Behavioral: Negative.     Per HPI unless specifically indicated above     Objective:    BP 106/70 (BP Location: Left Arm, Patient Position: Sitting, Cuff Size: Normal)    Pulse 64    Temp 97.6 F (36.4 C) (Oral)    Ht 5\' 1"  (1.549 m)    Wt 195 lb (88.5 kg)    BMI 36.84 kg/m   Wt Readings from Last 3 Encounters:  08/14/18 195 lb (88.5 kg)  08/06/18 199 lb 9.6 oz (90.5 kg)  04/08/18 197 lb (89.4 kg)    Physical Exam   Unable to perform due to telephone visit only.  Results for orders placed or performed in visit on 08/03/18  Ferritin  Result Value Ref Range   Ferritin 95 11 - 307 ng/mL  Iron and TIBC  Result Value Ref Range   Iron 79 28 - 170 ug/dL   TIBC 401 250 - 450 ug/dL   Saturation Ratios 20 10.4 - 31.8 %   UIBC 322 ug/dL  Comprehensive metabolic panel  Result Value Ref Range   Sodium 138 135 - 145 mmol/L   Potassium 4.3 3.5 - 5.1 mmol/L   Chloride 104 98 - 111 mmol/L   CO2 23 22 - 32 mmol/L   Glucose, Bld 129 (H) 70 - 99 mg/dL   BUN 21 8 - 23 mg/dL   Creatinine, Ser 1.47 (H) 0.44 - 1.00 mg/dL   Calcium 9.4 8.9 - 10.3 mg/dL   Total Protein 7.8 6.5 - 8.1 g/dL   Albumin 4.5 3.5 - 5.0 g/dL   AST 27 15 - 41 U/L   ALT 21 0 - 44 U/L   Alkaline Phosphatase 54 38 - 126 U/L   Total Bilirubin 0.9 0.3 - 1.2 mg/dL   GFR calc non Af Amer 36 (L) >60 mL/min   GFR calc Af Amer 41 (L) >60 mL/min   Anion gap 11 5 - 15  CBC with Differential/Platelet  Result Value Ref Range   WBC 6.6 4.0 - 10.5 K/uL   RBC 3.42 (L) 3.87 - 5.11 MIL/uL   Hemoglobin 11.2 (L) 12.0 - 15.0 g/dL   HCT 33.4 (L) 36.0 - 46.0 %   MCV 97.7 80.0 - 100.0 fL   MCH 32.7 26.0 - 34.0 pg   MCHC 33.5 30.0 - 36.0 g/dL   RDW 13.4 11.5 - 15.5 %   Platelets 98 (L) 150 - 400 K/uL   nRBC 0.0 0.0 - 0.2 %   Neutrophils Relative % 67 %   Neutro Abs 4.4 1.7 -  7.7 K/uL   Lymphocytes Relative 16 %   Lymphs Abs 1.0 0.7 - 4.0 K/uL   Monocytes Relative 11 %   Monocytes Absolute 0.8 0.1 - 1.0 K/uL   Eosinophils Relative 5 %   Eosinophils Absolute 0.4 0.0 - 0.5 K/uL   Basophils Relative 0 %   Basophils Absolute 0.0 0.0 - 0.1 K/uL   Immature Granulocytes 1 %   Abs Immature Granulocytes 0.04 0.00 - 0.07 K/uL  Assessment & Plan:   Problem List Items Addressed This Visit      Other   Fever    Acute with cough, reports this is improving.  Refuses imaging or face to face visit in ER, but agrees with worsening symptoms she will obtain these and has been advised to do so.   Message sent to [facility] for Covid testing.  Have recommended maintaining a 14 day at home quarantine at this time based on her current symptoms or maintaining quarantine until symptoms improve even if Covid testing remains negative.  Patient agrees with this POC.  May consider abx treatment if worsening symptoms. They will return in 2 weeks for follow-up.        I discussed the assessment and treatment plan with the patient. The patient was provided an opportunity to ask questions and all were answered. The patient agreed with the plan and demonstrated an understanding of the instructions.   The patient was advised to call back or seek an in-person evaluation if the symptoms worsen or if the condition fails to improve as anticipated.   I provided 15 minutes of time during this encounter.   Follow up plan: Return in about 2 weeks (around 08/28/2018) for Covid follow-up.

## 2018-08-14 NOTE — Telephone Encounter (Signed)
Patient called and scheduled for appt at [facility] site on 08/14/18. Pt advised to remain in car and to wear a mask at appt time. Pt verbalized understanding.

## 2018-08-20 LAB — NOVEL CORONAVIRUS, NAA: SARS-CoV-2, NAA: NOT DETECTED

## 2018-08-27 ENCOUNTER — Other Ambulatory Visit: Payer: Self-pay | Admitting: Nurse Practitioner

## 2018-09-08 ENCOUNTER — Other Ambulatory Visit: Payer: Self-pay | Admitting: Nurse Practitioner

## 2018-09-28 ENCOUNTER — Telehealth: Payer: Self-pay | Admitting: Nurse Practitioner

## 2018-09-28 NOTE — Telephone Encounter (Signed)
Patient is calling back to review screening questions. Thank you [phone]- [phone]

## 2018-09-29 ENCOUNTER — Ambulatory Visit (INDEPENDENT_AMBULATORY_CARE_PROVIDER_SITE_OTHER): Payer: PRIVATE HEALTH INSURANCE | Admitting: Nurse Practitioner

## 2018-09-29 ENCOUNTER — Encounter: Payer: Self-pay | Admitting: Nurse Practitioner

## 2018-09-29 ENCOUNTER — Other Ambulatory Visit: Payer: Self-pay

## 2018-09-29 VITALS — BP 128/74 | HR 59 | Temp 98.2°F | Ht 61.0 in | Wt 197.0 lb

## 2018-09-29 DIAGNOSIS — E1169 Type 2 diabetes mellitus with other specified complication: Secondary | ICD-10-CM

## 2018-09-29 DIAGNOSIS — N184 Chronic kidney disease, stage 4 (severe): Secondary | ICD-10-CM

## 2018-09-29 DIAGNOSIS — I480 Paroxysmal atrial fibrillation: Secondary | ICD-10-CM | POA: Diagnosis not present

## 2018-09-29 DIAGNOSIS — I4891 Unspecified atrial fibrillation: Secondary | ICD-10-CM | POA: Insufficient documentation

## 2018-09-29 DIAGNOSIS — I130 Hypertensive heart and chronic kidney disease with heart failure and stage 1 through stage 4 chronic kidney disease, or unspecified chronic kidney disease: Secondary | ICD-10-CM | POA: Diagnosis not present

## 2018-09-29 DIAGNOSIS — E785 Hyperlipidemia, unspecified: Secondary | ICD-10-CM | POA: Diagnosis not present

## 2018-09-29 DIAGNOSIS — I5032 Chronic diastolic (congestive) heart failure: Secondary | ICD-10-CM

## 2018-09-29 DIAGNOSIS — N183 Chronic kidney disease, stage 3 (moderate): Secondary | ICD-10-CM | POA: Diagnosis not present

## 2018-09-29 DIAGNOSIS — E1122 Type 2 diabetes mellitus with diabetic chronic kidney disease: Secondary | ICD-10-CM | POA: Diagnosis not present

## 2018-09-29 LAB — BAYER DCA HB A1C WAIVED: HB A1C (BAYER DCA - WAIVED): 6.3 % (ref <7.0)

## 2018-09-29 LAB — MICROALBUMIN, URINE WAIVED
Creatinine, Urine Waived: 10 mg/dL (ref 10–300)
Microalb, Ur Waived: 10 mg/L (ref 0–19)

## 2018-09-29 NOTE — Assessment & Plan Note (Signed)
Chronic, recommend she reschedule with nephrology with further evaluation.  Urine micro alb 10 and A:C 30-300.  Continue Olmesartan for kidney protection and monitor medications for renal dosing.

## 2018-09-29 NOTE — Progress Notes (Signed)
BP 128/74 (BP Location: Left Arm, Patient Position: Sitting)   Pulse (!) 59   Temp 98.2 F (36.8 C) (Oral)   Ht 5\' 1"  (1.549 m)   Wt 197 lb (89.4 kg)   SpO2 95%   BMI 37.22 kg/m    Subjective:    Patient ID: Erica Larsen, female    DOB: 19-Apr-1950, 68 y.o.   MRN: [id number]  HPI: Erica Larsen is a 68 y.o. female  Chief Complaint  Patient presents with  . Diabetes  . Hyperlipidemia   DIABETES Last A1C in February was 6.7%.  Continues on Januvia 25 MG (renal dosed).   Hypoglycemic episodes:no Polydipsia/polyuria: no Visual disturbance: no Chest pain: no Paresthesias: no Glucose Monitoring: rarely  Accucheck frequency: rarely  Fasting glucose: 125 this morning  Post prandial:  Evening:  Before meals: Taking Insulin?: no  Long acting insulin:  Short acting insulin: Blood Pressure Monitoring: daily Retinal Examination: Not up to Date Foot Exam: Up to Date Pneumovax: Up to Date Influenza: Up to Date Aspirin: no   HYPERTENSION / HYPERLIPIDEMIA/CHF Continues on Atorvastatin for HLD.   She is followed by Dr. Humphrey Rolls, cardiology.  She saw him last in June due to having rapid heart beat, had to change her medications to slow it down per her report. Seh was switched from ASA/Plavis to Xarelto on review.  Continues on Olmesartan-Amlodipine-HCTZ, Metoprolol (recently increased to 100), Hydralazine, Lasix.  Last Echo with EF 49% Satisfied with current treatment? yes Duration of hypertension: chronic BP monitoring frequency: daily BP range: 127/68 (often runs in this range at home) BP medication side effects: no Duration of hyperlipidemia: chronic Cholesterol medication side effects: no Cholesterol supplements: none Medication compliance: good compliance Aspirin: no Recent stressors: no Recurrent headaches: no Visual changes: no Palpitations: no Dyspnea: no Chest pain: no Lower extremity edema: no Dizzy/lightheaded: no   CHRONIC KIDNEY DISEASE Referral placed  last visit to see nephrology and ended up missing appointment due to Covid.  Recommended she call and reschedule.   CKD status: stable Medications renally dose: yes Previous renal evaluation: no Pneumovax:  Up to Date Influenza Vaccine:  Up to Date  Relevant past medical, surgical, family and social history reviewed and updated as indicated. Interim medical history since our last visit reviewed. Allergies and medications reviewed and updated.  Review of Systems  Constitutional: Negative for activity change, appetite change, diaphoresis, fatigue and fever.  Respiratory: Negative for cough, chest tightness and shortness of breath.   Cardiovascular: Negative for chest pain, palpitations and leg swelling.  Gastrointestinal: Negative for abdominal distention, abdominal pain, constipation, diarrhea, nausea and vomiting.  Endocrine: Negative for cold intolerance, heat intolerance, polydipsia, polyphagia and polyuria.  Neurological: Negative for dizziness, syncope, weakness, light-headedness, numbness and headaches.  Psychiatric/Behavioral: Negative.     Per HPI unless specifically indicated above     Objective:    BP 128/74 (BP Location: Left Arm, Patient Position: Sitting)   Pulse (!) 59   Temp 98.2 F (36.8 C) (Oral)   Ht 5\' 1"  (1.549 m)   Wt 197 lb (89.4 kg)   SpO2 95%   BMI 37.22 kg/m   Wt Readings from Last 3 Encounters:  09/29/18 197 lb (89.4 kg)  08/14/18 195 lb (88.5 kg)  08/06/18 199 lb 9.6 oz (90.5 kg)    Physical Exam Vitals signs and nursing note reviewed.  Constitutional:      General: She is awake. She is not in acute distress.    Appearance: She is  well-developed. She is not ill-appearing.  HENT:     Head: Normocephalic.     Right Ear: Hearing normal.     Left Ear: Hearing normal.     Nose: Nose normal.     Mouth/Throat:     Mouth: Mucous membranes are moist.  Eyes:     General: Lids are normal.        Right eye: No discharge.        Left eye: No  discharge.     Conjunctiva/sclera: Conjunctivae normal.     Pupils: Pupils are equal, round, and reactive to light.  Neck:     Musculoskeletal: Normal range of motion and neck supple.     Thyroid: No thyromegaly.     Vascular: No carotid bruit.  Cardiovascular:     Rate and Rhythm: Normal rate and regular rhythm.     Heart sounds: Murmur present. Systolic murmur present with a grade of 2/6. No gallop.      Comments: Systolic murmur, no radiation noted. Pulmonary:     Effort: Pulmonary effort is normal. No accessory muscle usage or respiratory distress.     Breath sounds: Normal breath sounds.  Abdominal:     General: Bowel sounds are normal.     Palpations: Abdomen is soft. There is no hepatomegaly or splenomegaly.  Musculoskeletal:     Right lower leg: No edema.     Left lower leg: No edema.  Lymphadenopathy:     Cervical: No cervical adenopathy.  Skin:    General: Skin is warm and dry.  Neurological:     Mental Status: She is alert and oriented to person, place, and time.  Psychiatric:        Attention and Perception: Attention normal.        Mood and Affect: Mood normal.        Speech: Speech normal.        Behavior: Behavior normal. Behavior is cooperative.        Thought Content: Thought content normal.        Judgment: Judgment normal.     Results for orders placed or performed in visit on 08/14/18  Novel Coronavirus, NAA ([facility])  Result Value Ref Range   SARS-CoV-2, NAA Not Detected Not Detected      Assessment & Plan:   Problem List Items Addressed This Visit      Cardiovascular and Mediastinum   Chronic diastolic heart failure (HCC)    Chronic, ongoing.  Continue collaboration with cardiology and current medication regimen.        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  Continue current medication regimen and collaboration with cardiology.        Atrial  fibrillation [facility])    New diagnosis with cardiology, rate controlled at this time.  Continue collaboration with cardiology.  Continue Xarelto and Metoprolol.          Endocrine   Hyperlipidemia associated with type 2 diabetes mellitus (HCC)    Chronic, ongoing.  Continue current medication regimen and adjust as needed.  Lipid panel today.        Relevant Orders   Lipid Panel w/o Chol/HDL Ratio   Type 2 diabetes mellitus with stage 3 chronic kidney disease, without long-term current use of insulin (HCC) - Primary    Chronic, stable with A1C at 6.3%.  Continue current medication regimen, Januvia and  monitoring BS at home regularly.  Return in 3 months.        Relevant Orders   Bayer DCA Hb A1c Waived   Microalbumin, Urine Waived     Genitourinary   Chronic kidney disease, stage 4 (severe) (HCC)    Chronic, recommend she reschedule with nephrology with further evaluation.  Urine micro alb 10 and A:C 30-300.  Continue Olmesartan for kidney protection and monitor medications for renal dosing.          Follow up plan: Return in about 3 months (around 12/30/2018) for T2DM, HTN/HLD, A-fib, CHF.

## 2018-09-29 NOTE — Assessment & Plan Note (Signed)
New diagnosis with cardiology, rate controlled at this time.  Continue collaboration with cardiology.  Continue Xarelto and Metoprolol.

## 2018-09-29 NOTE — Assessment & Plan Note (Signed)
Chronic, with BP at goal today and on home BP readings.  Continue current medication regimen and collaboration with cardiology.

## 2018-09-29 NOTE — Assessment & Plan Note (Signed)
Chronic, ongoing.  Continue collaboration with cardiology and current medication regimen. 

## 2018-09-29 NOTE — Assessment & Plan Note (Signed)
Chronic, stable with A1C at 6.3%.  Continue current medication regimen, Januvia and monitoring BS at home regularly.  Return in 3 months.

## 2018-09-29 NOTE — Assessment & Plan Note (Signed)
Chronic, ongoing.  Continue current medication regimen and adjust as needed. Lipid panel today. 

## 2018-09-29 NOTE — Patient Instructions (Signed)
Atrial Fibrillation ° °Atrial fibrillation is a type of heartbeat that is irregular or fast (rapid). If you have this condition, your heart beats without any order. This makes it hard for your heart to pump blood in a normal way. Having this condition gives you more risk for stroke, heart failure, and other heart problems. °Atrial fibrillation may start all of a sudden and then stop on its own, or it may become a long-lasting problem. °What are the causes? °This condition may be caused by heart conditions, such as: °· High blood pressure. °· Heart failure. °· Heart valve disease. °· Heart surgery. °Other causes include: °· Pneumonia. °· Obstructive sleep apnea. °· Lung cancer. °· Thyroid disease. °· Drinking too much alcohol. °Sometimes the cause is not known. °What increases the risk? °You are more likely to develop this condition if: °· You smoke. °· You are older. °· You have diabetes. °· You are overweight. °· You have a family history of this condition. °· You exercise often and hard. °What are the signs or symptoms? °Common symptoms of this condition include: °· A feeling like your heart is beating very fast. °· Chest pain. °· Feeling short of breath. °· Feeling light-headed or weak. °· Getting tired easily. °Follow these instructions at home: °Medicines °· Take over-the-counter and prescription medicines only as told by your doctor. °· If your doctor gives you a blood-thinning medicine, take it exactly as told. Taking too much of it can cause bleeding. Taking too little of it does not protect you against clots. Clots can cause a stroke. °Lifestyle ° °  ° °· Do not use any tobacco products. These include cigarettes, chewing tobacco, and e-cigarettes. If you need help quitting, ask your doctor. °· Do not drink alcohol. °· Do not drink beverages that have caffeine. These include coffee, soda, and tea. °· Follow diet instructions as told by your doctor. °· Exercise regularly as told by your doctor. °General  instructions °· If you have a condition that causes breathing to stop for a short period of time (apnea), treat it as told by your doctor. °· Keep a healthy weight. Do not use diet pills unless your doctor says they are safe for you. Diet pills may make heart problems worse. °· Keep all follow-up visits as told by your doctor. This is important. °Contact a doctor if: °· You notice a change in the speed, rhythm, or strength of your heartbeat. °· You are taking a blood-thinning medicine and you see more bruising. °· You get tired more easily when you move or exercise. °· You have a sudden change in weight. °Get help right away if: ° °· You have pain in your chest or your belly (abdomen). °· You have trouble breathing. °· You have blood in your vomit, poop, or pee (urine). °· You have any signs of a stroke. "BE FAST" is an easy way to remember the main warning signs: °? B - Balance. Signs are dizziness, sudden trouble walking, or loss of balance. °? E - Eyes. Signs are trouble seeing or a change in how you see. °? F - Face. Signs are sudden weakness or loss of feeling in the face, or the face or eyelid drooping on one side. °? A - Arms. Signs are weakness or loss of feeling in an arm. This happens suddenly and usually on one side of the body. °? S - Speech. Signs are sudden trouble speaking, slurred speech, or trouble understanding what people say. °? T - Time.   Time to call emergency services. Write down what time symptoms started. °· You have other signs of a stroke, such as: °? A sudden, very bad headache with no known cause. °? Feeling sick to your stomach (nausea). °? Throwing up (vomiting). °? Jerky movements you cannot control (seizure). °These symptoms may be an emergency. Do not wait to see if the symptoms will go away. Get medical help right away. Call your local emergency services (911 in the [place] Do not drive yourself to the hospital. °Summary °· Atrial fibrillation is a type of heartbeat that is irregular  or fast (rapid). °· You are at higher risk of this condition if you smoke, are older, have diabetes, or are overweight. °· Follow your doctor's instructions about medicines, diet, exercise, and follow-up visits. °· Get help right away if you think that you have signs of a stroke. °This information is not intended to replace advice given to you by your health care provider. Make sure you discuss any questions you have with your health care provider. °Document Released: 11/14/2007 Document Revised: 04/10/2017 Document Reviewed: 03/28/2017 °Elsevier Patient Education © 2020 [employer] ° °

## 2018-09-30 LAB — LIPID PANEL W/O CHOL/HDL RATIO
Cholesterol, Total: 141 mg/dL (ref 100–199)
HDL: 82 mg/dL (ref >39)
LDL Calculated: 43 mg/dL (ref 0–99)
Triglycerides: 81 mg/dL (ref 0–149)
VLDL Cholesterol Cal: 16 mg/dL (ref 5–40)

## 2018-09-30 NOTE — Progress Notes (Signed)
Normal test results noted.  Please call patient and make them aware of normal results and will continue to monitor at regular visits.  Have a great day.  Look forward to seeing you at your next visit.

## 2018-11-09 ENCOUNTER — Other Ambulatory Visit: Payer: Self-pay

## 2018-11-09 MED ORDER — LANSOPRAZOLE 30 MG PO CPDR: 30.0000 mg | DELAYED_RELEASE_CAPSULE | Freq: Every day | ORAL | 3 refills | Status: DC

## 2018-11-27 ENCOUNTER — Other Ambulatory Visit: Payer: Self-pay

## 2018-11-27 DIAGNOSIS — Z20822 Contact with and (suspected) exposure to covid-19: Secondary | ICD-10-CM

## 2018-11-28 LAB — NOVEL CORONAVIRUS, NAA: SARS-CoV-2, NAA: DETECTED — AB

## 2018-12-10 ENCOUNTER — Ambulatory Visit (INDEPENDENT_AMBULATORY_CARE_PROVIDER_SITE_OTHER): Payer: PRIVATE HEALTH INSURANCE | Admitting: Nurse Practitioner

## 2018-12-10 ENCOUNTER — Other Ambulatory Visit: Payer: Self-pay

## 2018-12-10 ENCOUNTER — Encounter: Payer: Self-pay | Admitting: Nurse Practitioner

## 2018-12-10 DIAGNOSIS — U071 COVID-19: Secondary | ICD-10-CM

## 2018-12-10 NOTE — Progress Notes (Signed)
BP 125/66   Pulse (!) 56   Temp 97.6 F (36.4 C) (Oral)    Subjective:    Patient ID: Erica Larsen, female    DOB: 1950/08/24, 68 y.o.   MRN: [id number]  HPI: Erica Larsen is a 68 y.o. female  Chief Complaint  Patient presents with  . COVID    2 week f/up    . This visit was completed via telephone due to the restrictions of the COVID-19 pandemic. All issues as above were discussed and addressed but no physical exam was performed. If it was felt that the patient should be evaluated in the office, they were directed there. The patient verbally consented to this visit. Patient was unable to complete an audio/visual visit due to Lack of equipment. Due to the catastrophic nature of the COVID-19 pandemic, this visit was done through audio contact only. . Location of the patient: home . Location of the provider: home . Those involved with this call:  . Provider: Marnee Guarneri, DNP . CMA: Yvonna Alanis, CMA . Front Desk/Registration: Jill Side  . Time spent on call: 15 minutes on the phone discussing health concerns. 10 minutes total spent in review of patient's record and preparation of their chart.  . I verified patient identity using two factors (patient name and date of birth). Patient consents verbally to being seen via telemedicine visit today.    COVID FOLLOW-UP: Has positive Covid testing on 11/27/2018.  Reports no increase in her chronic cough.  States overall she had minor symptoms, runny nose and sneezing, which have now improved.  Denies loss of taste or smell, productive cough, congestion, fever.  Does not work, stays at home.  Discussed Covid precautions at length with patient.   Relevant past medical, surgical, family and social history reviewed and updated as indicated. Interim medical history since our last visit reviewed. Allergies and medications reviewed and updated.  Review of Systems  Constitutional: Negative for activity change, appetite change,  fatigue and fever.  HENT: Negative for congestion, ear discharge, ear pain, facial swelling, postnasal drip, rhinorrhea, sinus pressure, sinus pain, sneezing, sore throat and voice change.   Eyes: Negative for pain and visual disturbance.  Respiratory: Negative for cough, chest tightness, shortness of breath and wheezing.   Cardiovascular: Negative for chest pain, palpitations and leg swelling.  Gastrointestinal: Negative for abdominal distention, abdominal pain, constipation, diarrhea, nausea and vomiting.  Endocrine: Negative.   Musculoskeletal: Negative for myalgias.  Neurological: Negative for dizziness, numbness and headaches.  Psychiatric/Behavioral: Negative.     Per HPI unless specifically indicated above     Objective:    BP 125/66   Pulse (!) 56   Temp 97.6 F (36.4 C) (Oral)   Wt Readings from Last 3 Encounters:  09/29/18 197 lb (89.4 kg)  08/14/18 195 lb (88.5 kg)  08/06/18 199 lb 9.6 oz (90.5 kg)    Physical Exam   Unable to perform due to lack of equipment.  Results for orders placed or performed in visit on 11/27/18  Novel Coronavirus, NAA ([facility])   Specimen: Oropharyngeal(OP) collection in vial transport medium   OROPHARYNGEA  TESTING  Result Value Ref Range   SARS-CoV-2, NAA Detected (A) Not Detected      Assessment & Plan:   Problem List Items Addressed This Visit      Other   RESOLVED: [id number] virus detected    Covid positive on 11/27/2018, symptoms now improved and no further concerns.  Have advised her to continue  to follow all guidelines when out in community, including social distancing, wearing a mask, and good hand washing.  Return for any worsening symptoms.         I discussed the assessment and treatment plan with the patient. The patient was provided an opportunity to ask questions and all were answered. The patient agreed with the plan and demonstrated an understanding of the instructions.   The patient was advised to call back or  seek an in-person evaluation if the symptoms worsen or if the condition fails to improve as anticipated.   I provided 15 minutes of time during this encounter.  Follow up plan: Return if symptoms worsen or fail to improve, for and as scheduled.

## 2018-12-10 NOTE — Assessment & Plan Note (Signed)
Covid positive on 11/27/2018, symptoms now improved and no further concerns.  Have advised her to continue to follow all guidelines when out in community, including social distancing, wearing a mask, and good hand washing.  Return for any worsening symptoms.

## 2018-12-10 NOTE — Patient Instructions (Signed)

## 2018-12-26 ENCOUNTER — Other Ambulatory Visit: Payer: Self-pay | Admitting: Family Medicine

## 2018-12-26 DIAGNOSIS — E039 Hypothyroidism, unspecified: Secondary | ICD-10-CM

## 2018-12-26 NOTE — Telephone Encounter (Signed)
Forwarding medication refill request to PCP for review. 

## 2018-12-28 ENCOUNTER — Other Ambulatory Visit: Payer: Self-pay

## 2018-12-28 DIAGNOSIS — M810 Age-related osteoporosis without current pathological fracture: Secondary | ICD-10-CM

## 2018-12-28 MED ORDER — RALOXIFENE HCL 60 MG PO TABS: ORAL_TABLET | ORAL | 2 refills | Status: DC

## 2018-12-28 NOTE — Telephone Encounter (Signed)
Last seen 09/29/18 and has f/up 01/08/19

## 2018-12-30 ENCOUNTER — Ambulatory Visit: Payer: Medicare Other | Admitting: Nurse Practitioner

## 2019-01-04 ENCOUNTER — Other Ambulatory Visit: Payer: Self-pay

## 2019-01-04 MED ORDER — FUROSEMIDE 20 MG PO TABS: ORAL_TABLET | ORAL | 3 refills | Status: DC

## 2019-01-04 NOTE — Telephone Encounter (Signed)
Patient last seen in August and has f/up on Friday.

## 2019-01-08 ENCOUNTER — Ambulatory Visit: Payer: Medicare Other | Admitting: Nurse Practitioner

## 2019-01-28 DIAGNOSIS — Z85828 Personal history of other malignant neoplasm of skin: Secondary | ICD-10-CM | POA: Diagnosis not present

## 2019-01-28 DIAGNOSIS — D485 Neoplasm of uncertain behavior of skin: Secondary | ICD-10-CM | POA: Diagnosis not present

## 2019-01-28 DIAGNOSIS — D2272 Melanocytic nevi of left lower limb, including hip: Secondary | ICD-10-CM | POA: Diagnosis not present

## 2019-01-28 DIAGNOSIS — L821 Other seborrheic keratosis: Secondary | ICD-10-CM | POA: Diagnosis not present

## 2019-01-28 DIAGNOSIS — D2262 Melanocytic nevi of left upper limb, including shoulder: Secondary | ICD-10-CM | POA: Diagnosis not present

## 2019-01-28 DIAGNOSIS — L814 Other melanin hyperpigmentation: Secondary | ICD-10-CM | POA: Diagnosis not present

## 2019-01-28 DIAGNOSIS — D2261 Melanocytic nevi of right upper limb, including shoulder: Secondary | ICD-10-CM | POA: Diagnosis not present

## 2019-02-03 ENCOUNTER — Other Ambulatory Visit: Payer: Self-pay | Admitting: Licensed Clinical Social Worker

## 2019-02-03 ENCOUNTER — Inpatient Hospital Stay: Payer: PRIVATE HEALTH INSURANCE | Attending: Internal Medicine

## 2019-02-03 ENCOUNTER — Inpatient Hospital Stay: Payer: Medicare Other

## 2019-02-03 ENCOUNTER — Other Ambulatory Visit: Payer: Self-pay

## 2019-02-03 DIAGNOSIS — E1122 Type 2 diabetes mellitus with diabetic chronic kidney disease: Secondary | ICD-10-CM | POA: Diagnosis not present

## 2019-02-03 DIAGNOSIS — D500 Iron deficiency anemia secondary to blood loss (chronic): Secondary | ICD-10-CM

## 2019-02-03 DIAGNOSIS — D509 Iron deficiency anemia, unspecified: Secondary | ICD-10-CM | POA: Diagnosis present

## 2019-02-03 DIAGNOSIS — N183 Chronic kidney disease, stage 3 unspecified: Secondary | ICD-10-CM | POA: Diagnosis not present

## 2019-02-03 LAB — IRON AND TIBC
Iron: 74 ug/dL (ref 28–170)
Saturation Ratios: 18 % (ref 10.4–31.8)
TIBC: 412 ug/dL (ref 250–450)
UIBC: 338 ug/dL

## 2019-02-03 LAB — COMPREHENSIVE METABOLIC PANEL
ALT: 34 U/L (ref 0–44)
AST: 36 U/L (ref 15–41)
Albumin: 4.6 g/dL (ref 3.5–5.0)
Alkaline Phosphatase: 65 U/L (ref 38–126)
Anion gap: 9 (ref 5–15)
BUN: 29 mg/dL — ABNORMAL HIGH (ref 8–23)
CO2: 27 mmol/L (ref 22–32)
Calcium: 9.5 mg/dL (ref 8.9–10.3)
Chloride: 101 mmol/L (ref 98–111)
Creatinine, Ser: 1.67 mg/dL — ABNORMAL HIGH (ref 0.44–1.00)
GFR calc Af Amer: 36 mL/min — ABNORMAL LOW (ref >60)
GFR calc non Af Amer: 31 mL/min — ABNORMAL LOW (ref >60)
Glucose, Bld: 125 mg/dL — ABNORMAL HIGH (ref 70–99)
Potassium: 4.4 mmol/L (ref 3.5–5.1)
Sodium: 137 mmol/L (ref 135–145)
Total Bilirubin: 0.8 mg/dL (ref 0.3–1.2)
Total Protein: 7.7 g/dL (ref 6.5–8.1)

## 2019-02-03 LAB — FERRITIN: Ferritin: 111 ng/mL (ref 11–307)

## 2019-02-03 LAB — CBC WITH DIFFERENTIAL/PLATELET
Abs Immature Granulocytes: 0.02 10*3/uL (ref 0.00–0.07)
Basophils Absolute: 0 10*3/uL (ref 0.0–0.1)
Basophils Relative: 0 %
Eosinophils Absolute: 0.1 10*3/uL (ref 0.0–0.5)
Eosinophils Relative: 2 %
HCT: 35.5 % — ABNORMAL LOW (ref 36.0–46.0)
Hemoglobin: 11.4 g/dL — ABNORMAL LOW (ref 12.0–15.0)
Immature Granulocytes: 0 %
Lymphocytes Relative: 24 %
Lymphs Abs: 1.3 10*3/uL (ref 0.7–4.0)
MCH: 32.9 pg (ref 26.0–34.0)
MCHC: 32.1 g/dL (ref 30.0–36.0)
MCV: 102.6 fL — ABNORMAL HIGH (ref 80.0–100.0)
Monocytes Absolute: 0.7 10*3/uL (ref 0.1–1.0)
Monocytes Relative: 12 %
Neutro Abs: 3.4 10*3/uL (ref 1.7–7.7)
Neutrophils Relative %: 62 %
Platelets: 167 10*3/uL (ref 150–400)
RBC: 3.46 MIL/uL — ABNORMAL LOW (ref 3.87–5.11)
RDW: 14 % (ref 11.5–15.5)
WBC: 5.6 10*3/uL (ref 4.0–10.5)
nRBC: 0 % (ref 0.0–0.2)

## 2019-02-05 ENCOUNTER — Inpatient Hospital Stay: Payer: PRIVATE HEALTH INSURANCE

## 2019-02-05 ENCOUNTER — Inpatient Hospital Stay (HOSPITAL_BASED_OUTPATIENT_CLINIC_OR_DEPARTMENT_OTHER): Payer: PRIVATE HEALTH INSURANCE | Admitting: Internal Medicine

## 2019-02-05 ENCOUNTER — Other Ambulatory Visit: Payer: Self-pay

## 2019-02-05 ENCOUNTER — Encounter: Payer: Self-pay | Admitting: Internal Medicine

## 2019-02-05 VITALS — BP 114/65 | HR 72 | Resp 18

## 2019-02-05 VITALS — BP 121/55 | HR 60 | Temp 96.6°F | Resp 18 | Wt 198.5 lb

## 2019-02-05 DIAGNOSIS — D539 Nutritional anemia, unspecified: Secondary | ICD-10-CM | POA: Diagnosis not present

## 2019-02-05 DIAGNOSIS — D509 Iron deficiency anemia, unspecified: Secondary | ICD-10-CM | POA: Diagnosis not present

## 2019-02-05 DIAGNOSIS — D500 Iron deficiency anemia secondary to blood loss (chronic): Secondary | ICD-10-CM | POA: Diagnosis not present

## 2019-02-05 MED ORDER — SODIUM CHLORIDE 0.9 % IV SOLN
Freq: Once | INTRAVENOUS | Status: AC
  Filled 2019-02-05: qty 250

## 2019-02-05 MED ORDER — SODIUM CHLORIDE 0.9 % IV SOLN
510.0000 mg | Freq: Once | INTRAVENOUS | Status: AC
  Administered 2019-02-05: 14:00:00 510 mg via INTRAVENOUS
  Filled 2019-02-05: qty 510

## 2019-02-05 NOTE — Assessment & Plan Note (Addendum)
Iron deficiency anemia- unclear etiology; question GI blood loss/CKD [hx large hiatal hernia status post recent fundoplication]. Check B12.   # Today hemoglobin is 11.4;  iron sat- 18%; continue PO iron day. Proceed with infusion today. If not improved discussed re: Bone marrow biopsy; hold off for now.   # CKD-III sec to DM-STABLE.    # DISPOSITION:   #  IV Ferrahem today.  # follow up in 6 months/ MD/possible IV ferreham labs- cbc/bmp/iron/ ferritin few days prior- Dr.B

## 2019-02-05 NOTE — Progress Notes (Signed)
[facility] NOTE  Patient Care Team: Venita Lick, NP as PCP - General (Nurse Practitioner) Dionisio David, MD as Consulting Physician (Cardiology) Cammie Sickle, MD as Consulting Physician (Internal Medicine)  CHIEF COMPLAINTS/PURPOSE OF CONSULTATION:   # 2014- Iron def Anemia-  [Sep 2014 EGD/COLO- NEG/ferritin 6; Dr.Wohl]. 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   # Hx CHF/CAD- asprin-plavix; Sep 2018- s/p hiatal hernia-fundoplication [Dr.Pabon]  HISTORY OF PRESENTING ILLNESS:  Erica Larsen 68 y.o.  female patient with above history of iron deficiency anemia of unclear etiology is here status post IV iron in November 2016 is here for follow-up.   Patient had a asymptomatic COVID infection in August 2020.  Recovered completely.  Patient denies any blood in stools or black or stools.  Denies any nausea vomiting.  No weight loss.  She feels tired.  .Review of Systems  Constitutional: Positive for malaise/fatigue. Negative for chills, diaphoresis, fever and weight loss.  HENT: Negative for nosebleeds and sore throat.   Eyes: Negative for double vision.  Respiratory: Negative for cough, hemoptysis, sputum production, shortness of breath and wheezing.   Cardiovascular: Negative for chest pain, palpitations, orthopnea and leg swelling.  Gastrointestinal: Negative for abdominal pain, blood in stool, constipation, diarrhea, heartburn, melena, nausea and vomiting.  Genitourinary: Negative for dysuria, frequency and urgency.  Musculoskeletal: Positive for back pain and joint pain.  Skin: Negative.  Negative for itching and rash.  Neurological: Negative for dizziness, tingling, focal weakness, weakness and headaches.  Endo/Heme/Allergies: Does not bruise/bleed easily.  Psychiatric/Behavioral: Negative for depression. The patient is not  nervous/anxious and does not have insomnia.      MEDICAL HISTORY:  Past Medical History:  Diagnosis Date  . Anemia 2017  . Arthritis    osteoarthritis  . CHF (congestive heart failure) ([place])   . Chronic kidney disease 2017   stage 3  . Coronary artery disease   . COVID-19 virus detected 08/14/2018  . Diabetes mellitus without complication ([place])   . Diverticulosis   . Esophagitis   . GERD (gastroesophageal reflux disease)   . Hiatal hernia   . High cholesterol   . Hypercalcemia   . Hypertension   . Hypothyroidism   . Lumbar radiculopathy   . Obesity   . Osteopenia    Hips  . Thyroid disease     SURGICAL HISTORY: Past Surgical History:  Procedure Laterality Date  . CATARACT EXTRACTION Bilateral    May - June 2018  . COLONOSCOPY WITH PROPOFOL N/A 08/01/2015   Procedure: COLONOSCOPY WITH PROPOFOL;  Surgeon: Lucilla Lame, MD;  Location: [facility] ENDOSCOPY;  Service: Endoscopy;  Laterality: N/A;  . CORONARY ANGIOPLASTY WITH STENT PLACEMENT    . ESOPHAGOGASTRODUODENOSCOPY (EGD) WITH PROPOFOL N/A 05/14/2016   Procedure: ESOPHAGOGASTRODUODENOSCOPY (EGD) WITH PROPOFOL;  Surgeon: Lucilla Lame, MD;  Location: [facility] ENDOSCOPY;  Service: Endoscopy;  Laterality: N/A;  . GIVENS CAPSULE STUDY N/A 06/01/2015   Procedure: GIVENS CAPSULE STUDY;  Surgeon: Lucilla Lame, MD;  Location: [facility] ENDOSCOPY;  Service: Endoscopy;  Laterality: N/A;  . HIATAL HERNIA REPAIR N/A 09/24/2016   Procedure: LAPAROSCOPIC REPAIR OF HIATAL HERNIA WITH FUNDOPLICATION WITH MESH (BIO-A);  Surgeon: Jules Husbands, MD;  Location: [facility] ORS;  Service: General;  Laterality: N/A;  Lithotomy position w arms tucked.   . pre cancer removed     left forearm  . squamous cell carcinoma surgery      SOCIAL HISTORY:  Social History   Socioeconomic History  . Marital status: Married    Spouse name: Not on file  . Number of children: Not on file  . Years of education: Not on file  . Highest education level: Not on file  Occupational  History  . Not on file  Tobacco Use  . Smoking status: Never Smoker  . Smokeless tobacco: Never Used  Substance and Sexual Activity  . Alcohol use: No  . Drug use: No  . Sexual activity: Yes    Birth control/protection: Post-menopausal  Other Topics Concern  . Not on file  Social History Narrative  . Not on file   Social Determinants of Health   Financial Resource Strain:   . Difficulty of Paying Living Expenses: Not on file  Food Insecurity:   . Worried About Charity fundraiser in the Last Year: Not on file  . Ran Out of Food in the Last Year: Not on file  Transportation Needs:   . Lack of Transportation (Medical): Not on file  . Lack of Transportation (Non-Medical): Not on file  Physical Activity:   . Days of Exercise per Week: Not on file  . Minutes of Exercise per Session: Not on file  Stress:   . Feeling of Stress : Not on file  Social Connections:   . Frequency of Communication with Friends and Family: Not on file  . Frequency of Social Gatherings with Friends and Family: Not on file  . Attends Religious Services: Not on file  . Active Member of Clubs or Organizations: Not on file  . Attends Archivist Meetings: Not on file  . Marital Status: Not on file  Intimate Partner Violence:   . Fear of Current or Ex-Partner: Not on file  . Emotionally Abused: Not on file  . Physically Abused: Not on file  . Sexually Abused: Not on file    FAMILY HISTORY: Family History  Problem Relation Age of Onset  . Heart disease Mother   . Kidney failure Mother   . Hypertension Mother   . Diabetes Mother   . Heart attack Father   . Hypertension Father   . Thyroid disease Father   . Hypertension Sister   . Diabetes Sister   . Thyroid disease Daughter   . Thyroid disease Daughter     ALLERGIES:  is allergic to sulfa antibiotics and latex.  MEDICATIONS:  Current Outpatient Medications  Medication Sig Dispense Refill  . atorvastatin (LIPITOR) 80 MG tablet Take  80 mg by mouth daily after breakfast.   0  . Ferrous Sulfate (SLOW FE PO) Take 1 tablet by mouth daily.    . furosemide (LASIX) 20 MG tablet TAKE 1 TABLET(20 MG) BY MOUTH DAILY 90 tablet 3  . hydrALAZINE (APRESOLINE) 25 MG tablet Take 1 tablet by mouth daily.    . lansoprazole (PREVACID) 30 MG capsule Take 1 capsule (30 mg total) by mouth daily at 12 noon. 90 capsule 3  . levothyroxine (SYNTHROID) 112 MCG tablet TAKE 1 TABLET(112 MCG) BY MOUTH DAILY 90 tablet 2  . metoprolol succinate (TOPROL-XL) 100 MG 24 hr tablet Take 1 tablet by mouth daily.    . Olmesartan-amLODIPine-HCTZ 40-5-12.5 MG TABS Take one tablet daily. 90 tablet   . Polyethyl Glycol-Propyl Glycol (SYSTANE ULTRA OP) Place 1 drop into both eyes 2 (two) times daily.     . raloxifene (EVISTA) 60 MG tablet TAKE 1 TABLET(60 MG) BY MOUTH DAILY 90 tablet 2  . sitaGLIPtin (JANUVIA) 25  MG tablet Take 1 tablet (25 mg total) by mouth daily. 90 tablet 3  . triamcinolone cream (KENALOG) 0.1 % apply to affected area twice a day AVOID FACE  0  . XARELTO 20 MG TABS tablet Take 1 tablet by mouth daily.     No current facility-administered medications for this visit.      [name]  PHYSICAL EXAMINATION: ECOG PERFORMANCE STATUS: 1 - Symptomatic but completely ambulatory  Vitals:   02/05/19 1301  BP: (!) 121/55  Pulse: 60  Resp: 18  Temp: (!) 96.6 F (35.9 C)   Filed Weights   02/05/19 1301  Weight: 198 lb 8 oz (90 kg)    Physical Exam  Constitutional: She is oriented to person, place, and time and well-developed, well-nourished, and in no distress.  HENT:  Head: Normocephalic and atraumatic.  Mouth/Throat: Oropharynx is clear and moist. No oropharyngeal exudate.  Eyes: Pupils are equal, round, and reactive to light.  Cardiovascular: Normal rate and regular rhythm.  Pulmonary/Chest: Effort normal and breath sounds normal. No respiratory distress. She has no wheezes.  Abdominal: Soft. Bowel sounds are normal. She exhibits no distension  and no mass. There is no abdominal tenderness. There is no rebound and no guarding.  Musculoskeletal:        General: No tenderness or edema. Normal range of motion.     Cervical back: Normal range of motion and neck supple.  Neurological: She is alert and oriented to person, place, and time.  Skin: Skin is warm.  Psychiatric: Affect normal.     LABORATORY DATA:  I have reviewed the data as listed Lab Results  Component Value Date   WBC 5.6 02/03/2019   HGB 11.4 (L) 02/03/2019   HCT 35.5 (L) 02/03/2019   MCV 102.6 (H) 02/03/2019   PLT 167 02/03/2019   Recent Labs    04/08/18 1111 08/03/18 1137 02/03/19 1539  NA 143 138 137  K 4.8 4.3 4.4  CL 98 104 101  CO2 _0 GLUCOSE 149* 129* 125*  BUN 41* 21 29*  CREATININE 2.32* 1.47* 1.67*  CALCIUM 10.2 9.4 9.5  GFRNONAA 21* 36* 31*  GFRAA 24* 41* 36*  PROT 7.4 7.8 7.7  ALBUMIN 4.9* 4.5 4.6  AST 50* 27 36  ALT 36* 21 34  ALKPHOS 73 54 65  BILITOT 0.4 0.9 0.8     ASSESSMENT & PLAN:  Iron deficiency anemia due to chronic blood loss  Iron deficiency anemia- unclear etiology; question GI blood loss/CKD [hx large hiatal hernia status post recent fundoplication]   # Today hemoglobin is 11.4;  iron sat- 18%; continue PO iron day. Proceed with infusion today. If not improved discussed re: Bone marrow biopsy; hold off for now.   # CKD-III sec to DM-STABLE.    # DISPOSITION:   #  IV [place] today.  # follow up in 6 months/ MD/possible IV ferreham labs- cbc/bmp/iron/ ferritin few days prior- Dr.B    Cammie Sickle, MD 02/05/2019 2:51 PM

## 2019-02-05 NOTE — Progress Notes (Signed)
Pt presents for follow up. No concerns expressed at this time.

## 2019-02-27 ENCOUNTER — Encounter: Payer: Self-pay | Admitting: Nurse Practitioner

## 2019-02-27 DIAGNOSIS — Z6838 Body mass index (BMI) 38.0-38.9, adult: Secondary | ICD-10-CM | POA: Insufficient documentation

## 2019-03-02 ENCOUNTER — Ambulatory Visit: Payer: Medicare Other | Admitting: Nurse Practitioner

## 2019-03-22 ENCOUNTER — Encounter: Payer: Self-pay | Admitting: Nurse Practitioner

## 2019-03-22 ENCOUNTER — Ambulatory Visit (INDEPENDENT_AMBULATORY_CARE_PROVIDER_SITE_OTHER): Payer: Medicare Other | Admitting: Nurse Practitioner

## 2019-03-22 ENCOUNTER — Other Ambulatory Visit: Payer: Self-pay

## 2019-03-22 VITALS — BP 101/61 | HR 56 | Temp 97.9°F

## 2019-03-22 DIAGNOSIS — N183 Chronic kidney disease, stage 3 unspecified: Secondary | ICD-10-CM

## 2019-03-22 DIAGNOSIS — N1831 Chronic kidney disease, stage 3a: Secondary | ICD-10-CM | POA: Diagnosis not present

## 2019-03-22 DIAGNOSIS — I130 Hypertensive heart and chronic kidney disease with heart failure and stage 1 through stage 4 chronic kidney disease, or unspecified chronic kidney disease: Secondary | ICD-10-CM

## 2019-03-22 DIAGNOSIS — E1122 Type 2 diabetes mellitus with diabetic chronic kidney disease: Secondary | ICD-10-CM

## 2019-03-22 DIAGNOSIS — D6869 Other thrombophilia: Secondary | ICD-10-CM | POA: Diagnosis not present

## 2019-03-22 DIAGNOSIS — E1169 Type 2 diabetes mellitus with other specified complication: Secondary | ICD-10-CM

## 2019-03-22 DIAGNOSIS — I5032 Chronic diastolic (congestive) heart failure: Secondary | ICD-10-CM

## 2019-03-22 DIAGNOSIS — I480 Paroxysmal atrial fibrillation: Secondary | ICD-10-CM

## 2019-03-22 DIAGNOSIS — E785 Hyperlipidemia, unspecified: Secondary | ICD-10-CM | POA: Diagnosis not present

## 2019-03-22 DIAGNOSIS — D631 Anemia in chronic kidney disease: Secondary | ICD-10-CM

## 2019-03-22 DIAGNOSIS — E039 Hypothyroidism, unspecified: Secondary | ICD-10-CM | POA: Diagnosis not present

## 2019-03-22 DIAGNOSIS — N184 Chronic kidney disease, stage 4 (severe): Secondary | ICD-10-CM | POA: Diagnosis not present

## 2019-03-22 DIAGNOSIS — E1121 Type 2 diabetes mellitus with diabetic nephropathy: Secondary | ICD-10-CM

## 2019-03-22 LAB — MICROALBUMIN, URINE WAIVED
Creatinine, Urine Waived: 50 mg/dL (ref 10–300)
Microalb, Ur Waived: 10 mg/L (ref 0–19)

## 2019-03-22 LAB — BAYER DCA HB A1C WAIVED: HB A1C (BAYER DCA - WAIVED): 5.8 % (ref <7.0)

## 2019-03-22 NOTE — Assessment & Plan Note (Signed)
Chronic, ongoing.  Continue collaboration with cardiology and current medication regimen.  Recommend: - Reminded to call for an overnight weight gain of >2 pounds or a weekly weight weight of >5 pounds - not adding salt to his food and has been reading food labels. Reviewed the importance of keeping daily sodium intake to <2000mg daily  

## 2019-03-22 NOTE — Assessment & Plan Note (Signed)
Recommend continued focus on healthy diet choices and regular physical activity (30 minutes 5 days a week).  Focus on small goals and set time limits.

## 2019-03-22 NOTE — Assessment & Plan Note (Signed)
Chronic, ongoing.  Followed by hematology and receiving iron transfusions.  Continue this collaboration, recent labs and note reviewed.

## 2019-03-22 NOTE — Assessment & Plan Note (Signed)
Chronic, she did not reschedule to see nephrology.  Discussed at length importance of visit with them.  Will place new referral.  Urine micro alb 10 and A:C 30-300.  Continue Olmesartan for kidney protection and monitor medications for renal dosing.

## 2019-03-22 NOTE — Assessment & Plan Note (Signed)
Chronic, stable with A1C at 5.8%, downward trend.  Continue current medication regimen, Januvia and monitoring BS at home regularly. May benefit from change to [facility] for HF, dependent on kidney function.  Obtain CMP today.  Urine micro ALB 10 and A:C 30-300. Return in 3 months.

## 2019-03-22 NOTE — Progress Notes (Signed)
BP 101/61   Pulse (!) 56   Temp 97.9 F (36.6 C) (Oral)   SpO2 99%    Subjective:    Patient ID: Erica Larsen, female    DOB: 1950/07/10, 69 y.o.   MRN: [id number]  HPI: Erica Larsen is a 69 y.o. female  Chief Complaint  Patient presents with  . Diabetes  . Hyperlipidemia  . Hypertension   DIABETES Last A1C in August was 6.3%.  Continues on Januvia 25 MG (renal dosed).   Hypoglycemic episodes:no Polydipsia/polyuria: no Visual disturbance: no Chest pain: no Paresthesias: no Glucose Monitoring: rarely             Accucheck frequency: rarely             Fasting glucose: has not checked in awhile, battery was dead this morning             Post prandial:             Evening:             Before meals: Taking Insulin?: no             Long acting insulin:             Short acting insulin: Blood Pressure Monitoring: daily Retinal Examination: Not up to Date Foot Exam: Up to Date Pneumovax: Up to Date Influenza: Up to Date Aspirin: no   ATRIAL FIBRILLATION Followed by cardiology. Atrial fibrillation status: stable Satisfied with current treatment: yes  Medication side effects:  no Medication compliance: good compliance Etiology of atrial fibrillation: unknown Palpitations:  no Chest pain:  no Dyspnea on exertion:  occasionally, baseline Orthopnea:  no Syncope:  no Edema:  no Ventricular rate control: B-blocker Anti-coagulation: long acting  HYPERTENSION / HYPERLIPIDEMIA/CHF Continues on Atorvastatin for HLD. She is followed by Dr. Humphrey Rolls, cardiology. She saw him last week per her report, but do not have note available. She was to be taking Hydralazine BID and was only taking once, so Dr. Humphrey Rolls instructed her to take BID.   Continues on Olmesartan-Amlodipine-HCTZ, Metoprolol (recently increased to 100), Hydralazine, Lasix.  Last Echo with EF 49% in May 2020. Satisfied with current treatment? yes Duration of hypertension: chronic BP monitoring frequency:  rarely BP range: 120/80's on average BP medication side effects: no Duration of hyperlipidemia: chronic Cholesterol medication side effects: no Cholesterol supplements: none Medication compliance: good compliance Aspirin: no Recent stressors: no Recurrent headaches: no Visual changes: no Palpitations: no Dyspnea: no Chest pain: no Lower extremity edema: no Dizzy/lightheaded: no   CHRONIC KIDNEY DISEASE To be seeing [place], but forgot appointment and did not call back to reschedule.  Is currently followed by hematology for chronic iron deficiency anemia with last visit on 02/05/2019, received iron infusion. H/H in December 11.4/35.5, MCV 102.6, iron 74. CMP in December noted GFR 31 and CRT 1.67. CKD status: stable Medications renally dose: yes Previous renal evaluation: no Pneumovax:  Up to Date Influenza Vaccine:  Up to Date  HYPOTHYROIDISM Continues on Levothyroxine 112 MCG daily with last TSH 0.832 in February 2020. Thyroid control status:stable Satisfied with current treatment? yes Medication side effects: no Medication compliance: good compliance Etiology of hypothyroidism:  Recent dose adjustment:no Fatigue: no Cold intolerance: no Heat intolerance: no Weight gain: no Weight loss: no Constipation: occasional Diarrhea/loose stools: no Palpitations: no Lower extremity edema: baseline Anxiety/depressed mood: no  Relevant past medical, surgical, family and social history reviewed and updated as indicated. Interim medical history since  our last visit reviewed. Allergies and medications reviewed and updated.  Review of Systems  Constitutional: Negative for activity change, appetite change, diaphoresis, fatigue and fever.  Respiratory: Negative for cough, chest tightness and shortness of breath.   Cardiovascular: Negative for chest pain, palpitations and leg swelling.  Gastrointestinal: Negative.   Endocrine: Negative for cold intolerance, heat  intolerance, polydipsia, polyphagia and polyuria.  Neurological: Negative.   Psychiatric/Behavioral: Negative.     Per HPI unless specifically indicated above     Objective:    BP 101/61   Pulse (!) 56   Temp 97.9 F (36.6 C) (Oral)   SpO2 99%   Wt Readings from Last 3 Encounters:  02/05/19 198 lb 8 oz (90 kg)  09/29/18 197 lb (89.4 kg)  08/14/18 195 lb (88.5 kg)    Physical Exam Vitals and nursing note reviewed.  Constitutional:      General: She is awake. She is not in acute distress.    Appearance: She is well-developed. She is not ill-appearing.  HENT:     Head: Normocephalic.     Right Ear: Hearing normal.     Left Ear: Hearing normal.  Eyes:     General: Lids are normal.        Right eye: No discharge.        Left eye: No discharge.     Conjunctiva/sclera: Conjunctivae normal.     Pupils: Pupils are equal, round, and reactive to light.  Neck:     Thyroid: No thyromegaly.     Vascular: No carotid bruit.  Cardiovascular:     Rate and Rhythm: Normal rate and regular rhythm.     Heart sounds: Normal heart sounds. No murmur. No gallop.   Pulmonary:     Effort: Pulmonary effort is normal. No accessory muscle usage or respiratory distress.     Breath sounds: Normal breath sounds.  Abdominal:     General: Bowel sounds are normal.     Palpations: Abdomen is soft.  Musculoskeletal:     Cervical back: Normal range of motion and neck supple.     Right lower leg: No edema.     Left lower leg: No edema.  Skin:    General: Skin is warm and dry.  Neurological:     Mental Status: She is alert and oriented to person, place, and time.  Psychiatric:        Attention and Perception: Attention normal.        Mood and Affect: Mood normal.        Behavior: Behavior normal. Behavior is cooperative.        Thought Content: Thought content normal.        Judgment: Judgment normal.    Diabetic Foot Exam - Simple   Simple Foot Form Visual Inspection No deformities, no  ulcerations, no other skin breakdown bilaterally: Yes Sensation Testing Intact to touch and monofilament testing bilaterally: Yes Pulse Check Posterior Tibialis and Dorsalis pulse intact bilaterally: Yes Comments     Results for orders placed or performed in visit on 02/03/19  Ferritin  Result Value Ref Range   Ferritin 111 11 - 307 ng/mL  Iron and TIBC  Result Value Ref Range   Iron 74 28 - 170 ug/dL   TIBC 412 250 - 450 ug/dL   Saturation Ratios 18 10.4 - 31.8 %   UIBC 338 ug/dL  Comprehensive metabolic panel  Result Value Ref Range   Sodium 137 135 - 145 mmol/L   Potassium  4.4 3.5 - 5.1 mmol/L   Chloride 101 98 - 111 mmol/L   CO2 27 22 - 32 mmol/L   Glucose, Bld 125 (H) 70 - 99 mg/dL   BUN 29 (H) 8 - 23 mg/dL   Creatinine, Ser 1.67 (H) 0.44 - 1.00 mg/dL   Calcium 9.5 8.9 - 10.3 mg/dL   Total Protein 7.7 6.5 - 8.1 g/dL   Albumin 4.6 3.5 - 5.0 g/dL   AST 36 15 - 41 U/L   ALT 34 0 - 44 U/L   Alkaline Phosphatase 65 38 - 126 U/L   Total Bilirubin 0.8 0.3 - 1.2 mg/dL   GFR calc non Af Amer 31 (L) >60 mL/min   GFR calc Af Amer 36 (L) >60 mL/min   Anion gap 9 5 - 15  CBC with Differential  Result Value Ref Range   WBC 5.6 4.0 - 10.5 K/uL   RBC 3.46 (L) 3.87 - 5.11 MIL/uL   Hemoglobin 11.4 (L) 12.0 - 15.0 g/dL   HCT 35.5 (L) 36.0 - 46.0 %   MCV 102.6 (H) 80.0 - 100.0 fL   MCH 32.9 26.0 - 34.0 pg   MCHC 32.1 30.0 - 36.0 g/dL   RDW 14.0 11.5 - 15.5 %   Platelets 167 150 - 400 K/uL   nRBC 0.0 0.0 - 0.2 %   Neutrophils Relative % 62 %   Neutro Abs 3.4 1.7 - 7.7 K/uL   Lymphocytes Relative 24 %   Lymphs Abs 1.3 0.7 - 4.0 K/uL   Monocytes Relative 12 %   Monocytes Absolute 0.7 0.1 - 1.0 K/uL   Eosinophils Relative 2 %   Eosinophils Absolute 0.1 0.0 - 0.5 K/uL   Basophils Relative 0 %   Basophils Absolute 0.0 0.0 - 0.1 K/uL   Immature Granulocytes 0 %   Abs Immature Granulocytes 0.02 0.00 - 0.07 K/uL      Assessment & Plan:   Problem List Items Addressed This  Visit      Cardiovascular and Mediastinum   Chronic diastolic heart failure (HCC)    Chronic, ongoing.  Continue collaboration with cardiology and current medication regimen.  Recommend: - Reminded to call for an overnight weight gain of >2 pounds or a weekly weight weight of >5 pounds - not adding salt to his food and has been reading food labels. 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  Continue current medication regimen and collaboration with cardiology.  Place new referral to nephrology and reiterated to patient importance of visit with them.  Obtain CMP today.        Relevant Medications   amiodarone (PACERONE) 200 MG tablet   Other Relevant Orders   Bayer DCA Hb A1c Waived   Atrial fibrillation [facility])    New diagnosis with cardiology, rate controlled at this time.  Continue collaboration with cardiology.  Continue Xarelto and Metoprolol.        Relevant Medications   amiodarone (PACERONE) 200 MG tablet     Endocrine   Hyperlipidemia associated with type 2 diabetes mellitus (HCC)    Chronic, ongoing.  Continue current medication regimen and adjust as needed.  Lipid panel today.        Relevant Orders  Bayer DCA Hb A1c Waived   Comprehensive metabolic panel   Lipid Panel w/o Chol/HDL Ratio   Type 2 diabetes mellitus with stage 3 chronic kidney disease, without long-term current use of insulin (HCC) - Primary    Chronic, stable with A1C at 5.8%, downward trend.  Continue current medication regimen, Januvia and monitoring BS at home regularly. May benefit from change to [facility] for HF, dependent on kidney function.  Obtain CMP today.  Urine micro ALB 10 and A:C 30-300. Return in 3 months.        Relevant Orders   Bayer DCA Hb A1c Waived    Microalbumin, Urine Waived   Comprehensive metabolic panel   Hypothyroid    Chronic, ongoing.  Continue current Levothyroxine dose and adjust as needed based on lab results.  Thyroid panel today.      Relevant Orders   Thyroid Panel With TSH     Genitourinary   Chronic kidney disease, stage 4 (severe) (HCC)    Chronic, she did not reschedule to see nephrology.  Discussed at length importance of visit with them.  Will place new referral.  Urine micro alb 10 and A:C 30-300.  Continue Olmesartan for kidney protection and monitor medications for renal dosing.      Anemia of chronic kidney failure, stage 3 (moderate)    Chronic, ongoing.  Followed by hematology and receiving iron transfusions.  Continue this collaboration, recent labs and note reviewed.        Hematopoietic and Hemostatic   Acquired thrombophilia ([place])    Secondary to Xarelto use.  Continue to monitor for bleeding and wounds.  Gentle skin care at home.  Followed by hematology for anemia.        Other   Morbid obesity ([place])    Recommend continued focus on healthy diet choices and regular physical activity (30 minutes 5 days a week).  Focus on small goals and set time limits.           Follow up plan: Return in about 3 months (around 06/19/2019) for T2DM, HTN/HLD, Hypothyroid.

## 2019-03-22 NOTE — Assessment & Plan Note (Signed)
Chronic, ongoing.  Continue current medication regimen and adjust as needed. Lipid panel today. 

## 2019-03-22 NOTE — Assessment & Plan Note (Addendum)
New diagnosis with cardiology, rate controlled at this time.  Continue collaboration with cardiology.  Continue Xarelto and Metoprolol.   

## 2019-03-22 NOTE — Assessment & Plan Note (Signed)
Chronic, ongoing.  Continue current Levothyroxine dose and adjust as needed based on lab results.  Thyroid panel today. 

## 2019-03-22 NOTE — Patient Instructions (Addendum)
We are recommending the vaccine to everyone who has not had an allergic reaction to any of the components of the vaccine. If you have specific questions about the vaccine, please bring them up with your health care provider to discuss them.   We will likely not be getting the vaccine in the office for the first rounds of vaccinations. The way they are releasing the vaccines is going to be through the health systems (like [facility], [facility], [facility], [place]) or through your county health department.   [facility] Department is giving vaccines to those 75+ starting 02/24/19  M-F 7AM to 4PM [employer] [address] [place], [place] in a drive through tent  If you are 65+ you can get a vaccine through [facility] by signing up for an appointment.  You can sign up by going to: [email or web address].  You can get more information by going to: [email or web address]   - Reminded to call for an overnight weight gain of >2 pounds or a weekly weight weight of >5 pounds - not adding salt to his food and has been reading food labels. Reviewed the importance of keeping daily sodium intake to 2000mg  daily    Heart Failure, Self Care Heart failure is a serious condition. This sheet explains things you need to do to take care of yourself at home. To help you stay as healthy as possible, you may be asked to change your diet, take certain medicines, and make other changes in your life. Your doctor may also give you more specific instructions. If you have problems or questions, call your doctor. What are the risks? Having heart failure makes it more likely for you to have some problems. These problems can get worse if you do not take good care of yourself. Problems may include:  Blood clotting problems. This may cause a stroke.  Damage to the kidneys, liver, or lungs.  Abnormal heart rhythms. Supplies needed:  Scale for weighing yourself.  Blood  pressure monitor.  Notebook.  Medicines. How to care for yourself when you have heart failure Medicines Take over-the-counter and prescription medicines only as told by your doctor. Take your medicines every day.  Do not stop taking your medicine unless your doctor tells you to do so.  Do not skip any medicines.  Get your prescriptions refilled before you run out of medicine. This is important. Eating and drinking   Eat heart-healthy foods. Talk with a diet specialist (dietitian) to create an eating plan.  Choose foods that: ? Have no trans fat. ? Are low in saturated fat and cholesterol.  Choose healthy foods, such as: ? Fresh or frozen fruits and vegetables. ? Fish. ? Low-fat (lean) meats. ? Legumes, such as beans, peas, and lentils. ? Fat-free or low-fat dairy products. ? Whole-grain foods. ? High-fiber foods.  Limit salt (sodium) if told by your doctor. Ask your diet specialist to tell you which seasonings are healthy for your heart.  Cook in healthy ways instead of frying. Healthy ways of cooking include roasting, grilling, broiling, baking, poaching, steaming, and stir-frying.  Limit how much fluid you drink, if told by your doctor. Alcohol use  Do not drink alcohol if: ? Your doctor tells you not to drink. ? Your heart was damaged by alcohol, or you have very bad heart failure. ? You are pregnant, may be pregnant, or are planning to become pregnant.  If you drink alcohol: ? Limit how much you use to:  0-1 drink a day for women.  0-2 drinks a day for men. ? Be aware of how much alcohol is in your drink. In the [place] one drink equals one 12 oz bottle of beer (355 mL), one 5 oz glass of wine (148 mL), or one 1 oz glass of hard liquor (44 mL). Lifestyle   Do not use any products that contain nicotine or tobacco, such as cigarettes, e-cigarettes, and chewing tobacco. If you need help quitting, ask your doctor. ? Do not use nicotine gum or patches before  talking to your doctor.  Do not use illegal drugs.  Lose weight if told by your doctor.  Do physical activity if told by your doctor. Talk to your doctor before you begin an exercise if: ? You are an older adult. ? You have very bad heart failure.  Learn to manage stress. If you need help, ask your doctor.  Get rehab (rehabilitation) to help you stay independent and to help with your quality of life.  Plan time to rest when you get tired. Check weight and blood pressure   Weigh yourself every day. This will help you to know if fluid is building up in your body. ? Weigh yourself every morning after you pee (urinate) and before you eat breakfast. ? Wear the same amount of clothing each time. ? Write down your daily weight. Give your record to your doctor.  Check and write down your blood pressure as told by your doctor.  Check your pulse as told by your doctor. Dealing with very hot and very cold weather  If it is very hot: ? Avoid activities that take a lot of energy. ? Use air conditioning or fans, or find a cooler place. ? Avoid caffeine and alcohol. ? Wear clothing that is loose-fitting, lightweight, and light-colored.  If it is very cold: ? Avoid activities that take a lot of energy. ? Layer your clothes. ? Wear mittens or gloves, a hat, and a scarf when you go outside. ? Avoid alcohol. Follow these instructions at home:  Stay up to date with shots (vaccines). Get pneumococcal and flu (influenza) shots.  Keep all follow-up visits as told by your doctor. This is important. Contact a doctor if:  You gain weight quickly.  You have increasing shortness of breath.  You cannot do your normal activities.  You get tired easily.  You cough a lot.  You don't feel like eating or feel like you may vomit (nauseous).  You become puffy (swell) in your hands, feet, ankles, or belly (abdomen).  You cannot sleep well because it is hard to breathe.  You feel like your  heart is beating fast (palpitations).  You get dizzy when you stand up. Get help right away if:  You have trouble breathing.  You or someone else notices a change in your behavior, such as having trouble staying awake.  You have chest pain or discomfort.  You pass out (faint). These symptoms may be an emergency. Do not wait to see if the symptoms will go away. Get medical help right away. Call your local emergency services (911 in the [place] Do not drive yourself to the hospital. Summary  Heart failure is a serious condition. To care for yourself, you may have to change your diet, take medicines, and make other lifestyle changes.  Take your medicines every day. Do not stop taking them unless your doctor tells you to do so.  Eat heart-healthy foods, such as fresh or frozen  fruits and vegetables, fish, lean meats, legumes, fat-free or low-fat dairy products, and whole-grain or high-fiber foods.  Ask your doctor if you can drink alcohol. You may have to stop alcohol use if you have very bad heart failure.  Contact your doctor if you gain weight quickly or feel that your heart is beating too fast. Get help right away if you pass out, or have chest pain or trouble breathing. This information is not intended to replace advice given to you by your health care provider. Make sure you discuss any questions you have with your health care provider. Document Revised: 05/19/2018 Document Reviewed: 05/20/2018 Elsevier Patient Education  [place].

## 2019-03-22 NOTE — Assessment & Plan Note (Signed)
Chronic, with BP at goal today and on home BP readings.  Continue current medication regimen and collaboration with cardiology.  Place new referral to nephrology and reiterated to patient importance of visit with them.  Obtain CMP today.

## 2019-03-22 NOTE — Assessment & Plan Note (Signed)
Secondary to Xarelto use.  Continue to monitor for bleeding and wounds.  Gentle skin care at home.  Followed by hematology for anemia.

## 2019-03-23 LAB — COMPREHENSIVE METABOLIC PANEL
ALT: 30 IU/L (ref 0–32)
AST: 32 IU/L (ref 0–40)
Albumin/Globulin Ratio: 1.8 (ref 1.2–2.2)
Albumin: 4.4 g/dL (ref 3.8–4.8)
Alkaline Phosphatase: 73 IU/L (ref 39–117)
BUN/Creatinine Ratio: 16 (ref 12–28)
BUN: 28 mg/dL — ABNORMAL HIGH (ref 8–27)
Bilirubin Total: 0.4 mg/dL (ref 0.0–1.2)
CO2: 23 mmol/L (ref 20–29)
Calcium: 9 mg/dL (ref 8.7–10.3)
Chloride: 102 mmol/L (ref 96–106)
Creatinine, Ser: 1.73 mg/dL — ABNORMAL HIGH (ref 0.57–1.00)
GFR calc Af Amer: 34 mL/min/{1.73_m2} — ABNORMAL LOW (ref >59)
GFR calc non Af Amer: 29 mL/min/{1.73_m2} — ABNORMAL LOW (ref >59)
Globulin, Total: 2.4 g/dL (ref 1.5–4.5)
Glucose: 101 mg/dL — ABNORMAL HIGH (ref 65–99)
Potassium: 4.5 mmol/L (ref 3.5–5.2)
Sodium: 139 mmol/L (ref 134–144)
Total Protein: 6.8 g/dL (ref 6.0–8.5)

## 2019-03-23 LAB — LIPID PANEL W/O CHOL/HDL RATIO
Cholesterol, Total: 136 mg/dL (ref 100–199)
HDL: 82 mg/dL (ref >39)
LDL Chol Calc (NIH): 34 mg/dL (ref 0–99)
Triglycerides: 117 mg/dL (ref 0–149)
VLDL Cholesterol Cal: 20 mg/dL (ref 5–40)

## 2019-03-23 LAB — THYROID PANEL WITH TSH
Free Thyroxine Index: 3.1 (ref 1.2–4.9)
T3 Uptake Ratio: 33 % (ref 24–39)
T4, Total: 9.5 ug/dL (ref 4.5–12.0)
TSH: 1.55 u[IU]/mL (ref 0.450–4.500)

## 2019-03-23 NOTE — Progress Notes (Signed)
Good afternoon.  Please let Erica Larsen know her labs have returned.  Kidney function still shows chronic kidney disease stage 4.  We need to ensure you schedule appointment with kidney doctor.  You should be hearing from [place], let us know if you do not.  Your cholesterol levels are at goal and thyroid testing normal, continue Levothyroxine and Lipitor daily.  IF any questions or concerns, please reach out.

## 2019-03-26 NOTE — Progress Notes (Signed)
Patient notified and verbalized understanding. 

## 2019-04-09 ENCOUNTER — Telehealth: Payer: Self-pay | Admitting: Nurse Practitioner

## 2019-04-12 ENCOUNTER — Other Ambulatory Visit: Payer: Self-pay | Admitting: Nurse Practitioner

## 2019-04-12 NOTE — Telephone Encounter (Signed)
Patient called to inform the doctor that her script has been refused by the pharmacy.  Patient wants to know why and said she only has one pill left.  Please advise.

## 2019-04-12 NOTE — Telephone Encounter (Signed)
Approved per protocol.  

## 2019-04-12 NOTE — Telephone Encounter (Signed)
Patient notified that it was sent in over the weekend.

## 2019-06-16 ENCOUNTER — Other Ambulatory Visit: Payer: Self-pay | Admitting: Nephrology

## 2019-06-16 DIAGNOSIS — N184 Chronic kidney disease, stage 4 (severe): Secondary | ICD-10-CM

## 2019-06-22 ENCOUNTER — Ambulatory Visit
Admission: RE | Admit: 2019-06-22 | Discharge: 2019-06-22 | Disposition: A | Discharge status: Home / Self Care | Payer: PRIVATE HEALTH INSURANCE | Source: Ambulatory Visit | Attending: Nephrology | Admitting: Nephrology

## 2019-06-22 ENCOUNTER — Other Ambulatory Visit: Payer: Self-pay

## 2019-06-22 DIAGNOSIS — N184 Chronic kidney disease, stage 4 (severe): Secondary | ICD-10-CM | POA: Insufficient documentation

## 2019-06-23 ENCOUNTER — Ambulatory Visit (INDEPENDENT_AMBULATORY_CARE_PROVIDER_SITE_OTHER): Payer: PRIVATE HEALTH INSURANCE | Admitting: Nurse Practitioner

## 2019-06-23 ENCOUNTER — Encounter: Payer: Self-pay | Admitting: Nurse Practitioner

## 2019-06-23 VITALS — BP 124/62 | HR 55 | Temp 97.8°F | Wt 201.4 lb

## 2019-06-23 DIAGNOSIS — E1121 Type 2 diabetes mellitus with diabetic nephropathy: Secondary | ICD-10-CM | POA: Diagnosis not present

## 2019-06-23 DIAGNOSIS — I5032 Chronic diastolic (congestive) heart failure: Secondary | ICD-10-CM | POA: Diagnosis not present

## 2019-06-23 DIAGNOSIS — N1832 Chronic kidney disease, stage 3b: Secondary | ICD-10-CM

## 2019-06-23 DIAGNOSIS — I480 Paroxysmal atrial fibrillation: Secondary | ICD-10-CM

## 2019-06-23 DIAGNOSIS — Z6838 Body mass index (BMI) 38.0-38.9, adult: Secondary | ICD-10-CM

## 2019-06-23 DIAGNOSIS — I130 Hypertensive heart and chronic kidney disease with heart failure and stage 1 through stage 4 chronic kidney disease, or unspecified chronic kidney disease: Secondary | ICD-10-CM

## 2019-06-23 DIAGNOSIS — D631 Anemia in chronic kidney disease: Secondary | ICD-10-CM

## 2019-06-23 DIAGNOSIS — N183 Chronic kidney disease, stage 3 unspecified: Secondary | ICD-10-CM

## 2019-06-23 DIAGNOSIS — N184 Chronic kidney disease, stage 4 (severe): Secondary | ICD-10-CM

## 2019-06-23 DIAGNOSIS — E1169 Type 2 diabetes mellitus with other specified complication: Secondary | ICD-10-CM

## 2019-06-23 DIAGNOSIS — E785 Hyperlipidemia, unspecified: Secondary | ICD-10-CM

## 2019-06-23 DIAGNOSIS — D6869 Other thrombophilia: Secondary | ICD-10-CM

## 2019-06-23 DIAGNOSIS — E1122 Type 2 diabetes mellitus with diabetic chronic kidney disease: Secondary | ICD-10-CM

## 2019-06-23 LAB — BAYER DCA HB A1C WAIVED: HB A1C (BAYER DCA - WAIVED): 5.8 % (ref <7.0)

## 2019-06-23 NOTE — Assessment & Plan Note (Signed)
Secondary to Xarelto use.  Continue to monitor for bleeding and wounds.  Gentle skin care at home.  Followed by hematology for anemia.

## 2019-06-23 NOTE — Assessment & Plan Note (Signed)
Recommended eating smaller high protein, low fat meals more frequently and exercising 30 mins a day 5 times a week with a goal of 10-15lb weight loss in the next 3 months. Patient voiced their understanding and motivation to adhere to these recommendations.  

## 2019-06-23 NOTE — Assessment & Plan Note (Signed)
Chronic, ongoing with rate controlled at this time.  Continue collaboration with cardiology and medication regimen as prescribed by Dr. Chancy Milroy.  Continue Xarelto and Metoprolol.

## 2019-06-23 NOTE — Assessment & Plan Note (Signed)
Chronic, ongoing.  Followed by hematology and receiving iron transfusions.  Continue this collaboration, recent labs and note reviewed.

## 2019-06-23 NOTE — Assessment & Plan Note (Signed)
Refer to morbid obesity plan for further.   °

## 2019-06-23 NOTE — Assessment & Plan Note (Signed)
Chronic, ongoing.  Continue current medication regimen and adjust as needed.  Lipid panel next visit.    

## 2019-06-23 NOTE — Progress Notes (Signed)
BP 124/62 (BP Location: Left Arm)   Pulse (!) 55   Temp 97.8 F (36.6 C) (Oral)   Wt 201 lb 6.4 oz (91.4 kg)   SpO2 98%   BMI 38.05 kg/m    Subjective:    Patient ID: Erica Larsen, female    DOB: 08/03/1950, 69 y.o.   MRN: [id number]  HPI: Erica Larsen is a 69 y.o. female  Chief Complaint  Patient presents with  . Diabetes    pt states she has not had recent eye exam   . Hyperlipidemia  . Hypertension   DIABETES Last A1C in August was 5.8%. Continues on Januvia 25 MG (renal dosed).  Hypoglycemic episodes:no Polydipsia/polyuria:no Visual disturbance:no Chest pain:no Paresthesias:no Glucose Monitoring:rarely Accucheck frequency: rarely Fasting glucose: rarely Post prandial: Evening: Before meals: Taking Insulin?:no Long acting insulin: Short acting insulin: Blood Pressure Monitoring:daily Retinal Examination:Not up to Date Foot Exam:Up to Date Pneumovax:Up to Date Influenza:Up to Date Aspirin:no  ATRIAL FIBRILLATION Followed by cardiology. Atrial fibrillation status: stable Satisfied with current treatment: yes  Medication side effects:  no Medication compliance: good compliance Etiology of atrial fibrillation: unknown Palpitations:  no Chest pain:  no Dyspnea on exertion:  occasionally, baseline Orthopnea:  no Syncope:  no Edema:  no Ventricular rate control: B-blocker Anti-coagulation: long acting  HYPERTENSION / HYPERLIPIDEMIA/CHF Continues on Atorvastatin for HLD. She is followed by Dr. Humphrey Rolls, cardiology. Sheis due to return in June.  Continues on Olmesartan-Amlodipine-HCTZ, Metoprolol (recently increased to 100), Hydralazine, Lasix. Last Echo with EF 49% in May 2020. Satisfied with current treatment?yes Duration of hypertension:chronic BP monitoring frequency:rarely BP range:119/55 this morning -- 120/80's on average BP medication side  effects:no Duration of hyperlipidemia:chronic Cholesterol medication side effects:no Cholesterol supplements: none Medication compliance:good compliance Aspirin:no Recent stressors:no Recurrent headaches:no Visual changes:no Palpitations:no Dyspnea:no Chest pain:no Lower extremity edema:no Dizzy/lightheaded:no  CHRONIC KIDNEY DISEASE Saw [facility], but forgot appointment and did not call back to reschedule.  Is currently followed by hematology for chronic iron deficiency anemia with last visit on 02/05/2019, received iron infusion.  CKD status:stable Medications renally dose:yes Previous renal evaluation:yes Pneumovax:Up to Date Influenza Vaccine:Up to Date  Relevant past medical, surgical, family and social history reviewed and updated as indicated. Interim medical history since our last visit reviewed. Allergies and medications reviewed and updated.  Review of Systems  Constitutional: Negative for activity change, appetite change, diaphoresis, fatigue and fever.  Respiratory: Negative for cough, chest tightness and shortness of breath.   Cardiovascular: Negative for chest pain, palpitations and leg swelling.  Gastrointestinal: Negative.   Endocrine: Negative for cold intolerance, heat intolerance, polydipsia, polyphagia and polyuria.  Neurological: Negative.   Psychiatric/Behavioral: Negative.     Per HPI unless specifically indicated above     Objective:    BP 124/62 (BP Location: Left Arm)   Pulse (!) 55   Temp 97.8 F (36.6 C) (Oral)   Wt 201 lb 6.4 oz (91.4 kg)   SpO2 98%   BMI 38.05 kg/m   Wt Readings from Last 3 Encounters:  06/23/19 201 lb 6.4 oz (91.4 kg)  02/05/19 198 lb 8 oz (90 kg)  09/29/18 197 lb (89.4 kg)    Physical Exam Vitals and nursing note reviewed.  Constitutional:      General: She is awake. She is not in acute distress.    Appearance: She is well-developed. She is not ill-appearing.  HENT:      Head: Normocephalic.     Right Ear: Hearing normal.  Left Ear: Hearing normal.  Eyes:     General: Lids are normal.        Right eye: No discharge.        Left eye: No discharge.     Conjunctiva/sclera: Conjunctivae normal.     Pupils: Pupils are equal, round, and reactive to light.  Neck:     Thyroid: No thyromegaly.     Vascular: No carotid bruit.  Cardiovascular:     Rate and Rhythm: Normal rate and regular rhythm.     Heart sounds: Murmur present. Systolic murmur present with a grade of 2/6. No gallop.   Pulmonary:     Effort: Pulmonary effort is normal. No accessory muscle usage or respiratory distress.     Breath sounds: Normal breath sounds.  Abdominal:     General: Bowel sounds are normal.     Palpations: Abdomen is soft.  Musculoskeletal:     Cervical back: Normal range of motion and neck supple.     Right lower leg: 1+ Pitting Edema present.     Left lower leg: 1+ Pitting Edema present.  Skin:    General: Skin is warm and dry.  Neurological:     Mental Status: She is alert and oriented to person, place, and time.  Psychiatric:        Attention and Perception: Attention normal.        Mood and Affect: Mood normal.        Behavior: Behavior normal. Behavior is cooperative.        Thought Content: Thought content normal.        Judgment: Judgment normal.     Results for orders placed or performed in visit on 03/22/19  Bayer DCA Hb A1c Waived  Result Value Ref Range   HB A1C (BAYER DCA - WAIVED) 5.8 <7.0 %  Microalbumin, Urine Waived  Result Value Ref Range   Microalb, Ur Waived 10 0 - 19 mg/L   Creatinine, Urine Waived 50 10 - 300 mg/dL   Microalb/Creat Ratio 30-300 (H) <30 mg/g  Comprehensive metabolic panel  Result Value Ref Range   Glucose 101 (H) 65 - 99 mg/dL   BUN 28 (H) 8 - 27 mg/dL   Creatinine, Ser 1.73 (H) 0.57 - 1.00 mg/dL   GFR calc non Af Amer 29 (L) >59 mL/min/1.73   GFR calc Af Amer 34 (L) >59 mL/min/1.73   BUN/Creatinine Ratio 16 12 - 28    Sodium 139 134 - 144 mmol/L   Potassium 4.5 3.5 - 5.2 mmol/L   Chloride 102 96 - 106 mmol/L   CO2 23 20 - 29 mmol/L   Calcium 9.0 8.7 - 10.3 mg/dL   Total Protein 6.8 6.0 - 8.5 g/dL   Albumin 4.4 3.8 - 4.8 g/dL   Globulin, Total 2.4 1.5 - 4.5 g/dL   Albumin/Globulin Ratio 1.8 1.2 - 2.2   Bilirubin Total 0.4 0.0 - 1.2 mg/dL   Alkaline Phosphatase 73 39 - 117 IU/L   AST 32 0 - 40 IU/L   ALT 30 0 - 32 IU/L  Lipid Panel w/o Chol/HDL Ratio  Result Value Ref Range   Cholesterol, Total 136 100 - 199 mg/dL   Triglycerides 117 0 - 149 mg/dL   HDL 82 >39 mg/dL   VLDL Cholesterol Cal 20 5 - 40 mg/dL   LDL Chol Calc (NIH) 34 0 - 99 mg/dL  Thyroid Panel With TSH  Result Value Ref Range   TSH 1.550 0.450 - 4.500 uIU/mL  T4, Total 9.5 4.5 - 12.0 ug/dL   T3 Uptake Ratio 33 24 - 39 %   Free Thyroxine Index 3.1 1.2 - 4.9      Assessment & Plan:   Problem List Items Addressed This Visit      Cardiovascular and Mediastinum   Chronic diastolic heart failure (HCC)    Chronic, ongoing.  Continue collaboration with cardiology and current medication regimen.  Recommend: - Reminded to call for an overnight weight gain of >2 pounds or a weekly weight weight of >5 pounds - not adding salt to his food and has been reading food labels. 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  Continue current medication regimen and collaboration with cardiology and nephrology.  Recent labs obtained by nephrology.        Atrial fibrillation (HCC)    Chronic, ongoing with rate controlled at this time.  Continue collaboration with cardiology and medication regimen as prescribed by Dr. Chancy Milroy.  Continue Xarelto and Metoprolol.          Endocrine   Hyperlipidemia associated with type 2 diabetes mellitus (HCC)    Chronic, ongoing.  Continue current  medication regimen and adjust as needed.  Lipid panel next visit.      Type 2 diabetes mellitus with stage 3 chronic kidney disease, without long-term current use of insulin (HCC) - Primary    Chronic, stable with A1C at 5.8%, no changes from previous.  Continue current medication regimen, Januvia and monitoring BS at home regularly. May benefit from change to [facility] for HF, dependent on kidney function at upcoming visit.  Return in 3 months.        Relevant Orders   Bayer DCA Hb A1c Waived     Genitourinary   Chronic kidney disease, stage 4 (severe) (HCC)    Chronic, followed by nephrology at this time.  Urine micro alb 10 and A:C 30-300 last visit.  Continue Olmesartan for kidney protection and monitor medications for renal dosing.  BMP next visit.      Anemia of chronic kidney failure, stage 3 (moderate)    Chronic, ongoing.  Followed by hematology and receiving iron transfusions.  Continue this collaboration, recent labs and note reviewed.        Hematopoietic and Hemostatic   Acquired thrombophilia ([place])    Secondary to Xarelto use.  Continue to monitor for bleeding and wounds.  Gentle skin care at home.  Followed by hematology for anemia.        Other   Morbid obesity ([place])    Recommended eating smaller high protein, low fat meals more frequently and exercising 30 mins a day 5 times a week with a goal of 10-15lb weight loss in the next 3 months. Patient voiced their understanding and motivation to adhere to these recommendations.       BMI 38.0-38.9,adult    Refer to morbid obesity plan for further.          Follow up plan: Return in about 3 months (around 09/23/2019) for T2DM, HTn/HLD, HF.

## 2019-06-23 NOTE — Assessment & Plan Note (Signed)
Chronic, with BP at goal today and on home BP readings.  Continue current medication regimen and collaboration with cardiology and nephrology.  Recent labs obtained by nephrology.

## 2019-06-23 NOTE — Patient Instructions (Signed)
Heart Failure Eating Plan Heart failure, also called congestive heart failure, occurs when your heart does not pump blood well enough to meet your body's needs for oxygen-rich blood. Heart failure is a long-term (chronic) condition. Living with heart failure can be challenging. However, following your health care provider's instructions about a healthy lifestyle and working with a diet and nutrition specialist (dietitian) to choose the right foods may help to improve your symptoms. What are tips for following this plan? Reading food labels  Check food labels for the amount of sodium per serving. Choose foods that have less than 140 mg (milligrams) of sodium in each serving.  Check food labels for the number of calories per serving. This is important if you need to limit your daily calorie intake to lose weight.  Check food labels for the serving size. If you eat more than one serving, you will be eating more sodium and calories than what is listed on the label.  Look for foods that are labeled as "sodium-free," "very low sodium," or "low sodium." ? Foods labeled as "reduced sodium" or "lightly salted" may still have more sodium than what is recommended for you. Cooking  Avoid adding salt when cooking. Ask your health care provider or dietitian before using salt substitutes.  Season food with salt-free seasonings, spices, or herbs. Check the label of seasoning mixes to make sure they do not contain salt.  Cook with heart-healthy oils, such as olive, canola, soybean, or sunflower oil.  Do not fry foods. Cook foods using low-fat methods, such as baking, boiling, grilling, and broiling.  Limit unhealthy fats when cooking by: ? Removing the skin from poultry, such as chicken. ? Removing all visible fats from meats. ? Skimming the fat off from stews, soups, and gravies before serving them. Meal planning   Limit your intake of: ? Processed, canned, or pre-packaged foods. ? Foods that are  high in trans fat, such as fried foods. ? Sweets, desserts, sugary drinks, and other foods with added sugar. ? Full-fat dairy products, such as whole milk.  Eat a balanced diet that includes: ? 4-5 servings of fruit each day and 4-5 servings of vegetables each day. At each meal, try to fill half of your plate with fruits and vegetables. ? Up to 6-8 servings of whole grains each day. ? Up to 2 servings of lean meat, poultry, or fish each day. One serving of meat is equal to 3 oz. This is about the same size as a deck of cards. ? 2 servings of low-fat dairy each day. ? Heart-healthy fats. Healthy fats called omega-3 fatty acids are found in foods such as flaxseed and cold-water fish like sardines, salmon, and mackerel.  Aim to eat 25-35 g (grams) of fiber a day. Foods that are high in fiber include apples, broccoli, carrots, beans, peas, and whole grains.  Do not add salt or condiments that contain salt (such as soy sauce) to foods before eating.  When eating at a restaurant, ask that your food be prepared with less salt or no salt, if possible.  Try to eat 2 or more vegetarian meals each week.  Eat more home-cooked food and eat less restaurant, buffet, and fast food. General information  Do not eat more than 2,300 mg of salt (sodium) a day. The amount of sodium that is recommended for you may be lower, depending on your condition.  Maintain a healthy body weight as directed. Ask your health care provider what a healthy weight is  for you. ? Check your weight every day. ? Work with your health care provider and dietitian to make a plan that is right for you to lose weight or maintain your current weight.  Limit how much fluid you drink. Ask your health care provider or dietitian how much fluid you can have each day.  Limit or avoid alcohol as told by your health care provider or dietitian. Recommended foods The items listed may not be a complete list. Talk with your dietitian about what  dietary choices are best for you. Fruits All fresh, frozen, and canned fruits. Dried fruits, such as raisins, prunes, and cranberries. Vegetables All fresh vegetables. Vegetables that are frozen without sauce or added salt. Low-sodium or sodium-free canned vegetables. Grains Bread with less than 80 mg of sodium per slice. Whole-wheat pasta, quinoa, and brown rice. Oats and oatmeal. Barley. [place]. Grits and cream of wheat. Whole-grain and whole-wheat cold cereal. Meats and other protein foods Lean cuts of meat. Skinless chicken and [place]. Fish with high omega-3 fatty acids, such as salmon, sardines, and other cold-water fishes. Eggs. Dried beans, peas, and edamame. Unsalted nuts and nut butters. Dairy Low-fat or nonfat (skim) milk and dried milk. Rice milk, soy milk, and almond milk. Low-fat or nonfat yogurt. Small amounts of reduced-sodium block cheese. Low-sodium cottage cheese. Fats and oils Olive, canola, soybean, flaxseed, or sunflower oil. Avocado. Sweets and desserts Apple sauce. Granola bars. Sugar-free pudding and gelatin. Frozen fruit bars. Seasoning and other foods Fresh and dried herbs. Lemon or lime juice. Vinegar. Low-sodium ketchup. Salt-free marinades, salad dressings, sauces, and seasonings. The items listed above may not be a complete list of foods and beverages you can eat. Contact a dietitian for more information. Foods to avoid The items listed may not be a complete list. Talk with your dietitian about what dietary choices are best for you. Fruits Fruits that are dried with sodium-containing preservatives. Vegetables Canned vegetables. Frozen vegetables with sauce or seasonings. Creamed vegetables. [place] fries. Onion rings. Pickled vegetables and sauerkraut. Grains Bread with more than 80 mg of sodium per slice. Hot or cold cereal with more than 140 mg sodium per serving. Salted pretzels and crackers. Pre-packaged breadcrumbs. Bagels, croissants, and biscuits. Meats  and other protein foods Ribs and chicken wings. Bacon, ham, pepperoni, bologna, salami, and packaged luncheon meats. Hot dogs, bratwurst, and sausage. Canned meat. Smoked meat and fish. Salted nuts and seeds. Dairy Whole milk, half-and-half, and cream. Buttermilk. Processed cheese, cheese spreads, and cheese curds. Regular cottage cheese. Feta cheese. Shredded cheese. String cheese. Fats and oils Butter, lard, shortening, ghee, and bacon fat. Canned and packaged gravies. Seasoning and other foods Onion salt, garlic salt, table salt, and sea salt. Marinades. Regular salad dressings. Relishes, pickles, and olives. Meat flavorings and tenderizers, and bouillon cubes. Horseradish, ketchup, and mustard. Worcestershire sauce. Teriyaki sauce, soy sauce (including reduced sodium). Hot sauce and Tabasco sauce. Steak sauce, fish sauce, oyster sauce, and cocktail sauce. Taco seasonings. Barbecue sauce. Tartar sauce. The items listed above may not be a complete list of foods and beverages you should avoid. Contact a dietitian for more information. Summary  A heart failure eating plan includes changes that limit your intake of sodium and unhealthy fat, and it may help you lose weight or maintain a healthy weight. Your health care provider may also recommend limiting how much fluid you drink.  Most people with heart failure should eat no more than 2,300 mg of salt (sodium) a day. The amount of sodium  that is recommended for you may be lower, depending on your condition.  Contact your health care provider or dietitian before making any major changes to your diet. This information is not intended to replace advice given to you by your health care provider. Make sure you discuss any questions you have with your health care provider. Document Revised: 04/02/2018 Document Reviewed: 06/21/2016 Elsevier Patient Education  2020 Elsevier Inc.  

## 2019-06-23 NOTE — Assessment & Plan Note (Signed)
Chronic, ongoing.  Continue collaboration with cardiology and current medication regimen.  Recommend: - Reminded to call for an overnight weight gain of >2 pounds or a weekly weight weight of >5 pounds - not adding salt to his food and has been reading food labels. Reviewed the importance of keeping daily sodium intake to <2000mg daily  

## 2019-06-23 NOTE — Assessment & Plan Note (Signed)
Chronic, followed by nephrology at this time.  Urine micro alb 10 and A:C 30-300 last visit.  Continue Olmesartan for kidney protection and monitor medications for renal dosing.  BMP next visit.

## 2019-06-23 NOTE — Assessment & Plan Note (Signed)
Chronic, stable with A1C at 5.8%, no changes from previous.  Continue current medication regimen, Januvia and monitoring BS at home regularly. May benefit from change to [facility] for HF, dependent on kidney function at upcoming visit.  Return in 3 months.

## 2019-06-30 ENCOUNTER — Ambulatory Visit (INDEPENDENT_AMBULATORY_CARE_PROVIDER_SITE_OTHER): Payer: PRIVATE HEALTH INSURANCE

## 2019-06-30 VITALS — BP 132/75 | HR 56 | Wt 201.0 lb

## 2019-06-30 DIAGNOSIS — Z0000 Encounter for general adult medical examination without abnormal findings: Secondary | ICD-10-CM | POA: Diagnosis not present

## 2019-06-30 NOTE — Progress Notes (Signed)
Subjective:   Erica Larsen is a 69 y.o. female who presents for Medicare Annual (Subsequent) preventive examination.  This visit is being conducted via phone call  - after an attmept to do on video chat - due to the COVID-19 pandemic. This patient has given me verbal consent via phone to conduct this visit, patient states they are participating from their home address. Some vital signs may be absent or patient reported.   Patient identification: identified by name, DOB, and current address.    Review of Systems:         Objective:     Vitals: BP 132/75   Pulse (!) 56   Wt 201 lb (91.2 kg)   BMI 37.98 kg/m   Body mass index is 37.98 kg/m.  Advanced Directives 08/06/2018 12/05/2017 09/24/2016 09/16/2016 05/31/2016 05/14/2016 01/17/2016  Does Patient Have a Medical Advance Directive? No No No No No No No  Would patient like information on creating a medical advance directive? No - Patient declined No - Patient declined No - Patient declined No - Patient declined No - Patient declined No - Patient declined -    Tobacco Social History   Tobacco Use  Smoking Status Never Smoker  Smokeless Tobacco Never Used     Counseling given: Not Answered   Clinical Intake:  Pre-visit preparation completed: Yes  Pain : No/denies pain     Nutritional Status: BMI > 30  Obese Nutritional Risks: None Diabetes: No  How often do you need to have someone help you when you read instructions, pamphlets, or other written materials from your doctor or pharmacy?: 1 - Never  Interpreter Needed?: No  Information entered by :: Dreyah Montrose,LPN  Past Medical History:  Diagnosis Date  . Anemia 2017  . Arthritis    osteoarthritis  . CHF (congestive heart failure) ([place])   . Chronic kidney disease 2017   stage 3  . Coronary artery disease   . COVID-19 virus detected 08/14/2018  . Diabetes mellitus without complication ([place])   . Diverticulosis   . Esophagitis   . GERD (gastroesophageal  reflux disease)   . Hiatal hernia   . High cholesterol   . Hypercalcemia   . Hypertension   . Hypothyroidism   . Lumbar radiculopathy   . Obesity   . Osteopenia    Hips  . Thyroid disease    Past Surgical History:  Procedure Laterality Date  . CATARACT EXTRACTION Bilateral    May - June 2018  . COLONOSCOPY WITH PROPOFOL N/A 08/01/2015   Procedure: COLONOSCOPY WITH PROPOFOL;  Surgeon: Lucilla Lame, MD;  Location: [facility] ENDOSCOPY;  Service: Endoscopy;  Laterality: N/A;  . CORONARY ANGIOPLASTY WITH STENT PLACEMENT    . ESOPHAGOGASTRODUODENOSCOPY (EGD) WITH PROPOFOL N/A 05/14/2016   Procedure: ESOPHAGOGASTRODUODENOSCOPY (EGD) WITH PROPOFOL;  Surgeon: Lucilla Lame, MD;  Location: [facility] ENDOSCOPY;  Service: Endoscopy;  Laterality: N/A;  . GIVENS CAPSULE STUDY N/A 06/01/2015   Procedure: GIVENS CAPSULE STUDY;  Surgeon: Lucilla Lame, MD;  Location: [facility] ENDOSCOPY;  Service: Endoscopy;  Laterality: N/A;  . HIATAL HERNIA REPAIR N/A 09/24/2016   Procedure: LAPAROSCOPIC REPAIR OF HIATAL HERNIA WITH FUNDOPLICATION WITH MESH (BIO-A);  Surgeon: Jules Husbands, MD;  Location: [facility] ORS;  Service: General;  Laterality: N/A;  Lithotomy position w arms tucked.   . pre cancer removed     left forearm  . squamous cell carcinoma surgery     Family History  Problem Relation Age of Onset  . Heart disease Mother   .  Kidney failure Mother   . Hypertension Mother   . Diabetes Mother   . Heart attack Father   . Hypertension Father   . Thyroid disease Father   . Hypertension Sister   . Diabetes Sister   . Thyroid disease Daughter   . Thyroid disease Daughter    Social History   Socioeconomic History  . Marital status: Married    Spouse name: Not on file  . Number of children: Not on file  . Years of education: Not on file  . Highest education level: Not on file  Occupational History  . Not on file  Tobacco Use  . Smoking status: Never Smoker  . Smokeless tobacco: Never Used  Substance and Sexual  Activity  . Alcohol use: No  . Drug use: No  . Sexual activity: Yes    Birth control/protection: Post-menopausal  Other Topics Concern  . Not on file  Social History Narrative  . Not on file   Social Determinants of Health   Financial Resource Strain:   . Difficulty of Paying Living Expenses:   Food Insecurity:   . Worried About Charity fundraiser in the Last Year:   . Arboriculturist in the Last Year:   Transportation Needs:   . Film/video editor (Medical):   [name] Lack of Transportation (Non-Medical):   Physical Activity:   . Days of Exercise per Week:   . Minutes of Exercise per Session:   Stress:   . Feeling of Stress :   Social Connections:   . Frequency of Communication with Friends and Family:   . Frequency of Social Gatherings with Friends and Family:   . Attends Religious Services:   . Active Member of Clubs or Organizations:   . Attends Archivist Meetings:   [name] Marital Status:     Outpatient Encounter Medications as of 06/30/2019  Medication Sig  . amiodarone (PACERONE) 200 MG tablet Take 200 mg by mouth daily.  [name] atorvastatin (LIPITOR) 80 MG tablet Take 80 mg by mouth daily after breakfast.   . Ferrous Sulfate (SLOW FE PO) Take 1 tablet by mouth daily.  . furosemide (LASIX) 20 MG tablet TAKE 1 TABLET(20 MG) BY MOUTH DAILY  . hydrALAZINE (APRESOLINE) 25 MG tablet Take 1 tablet by mouth daily.  [name] JANUVIA 25 MG tablet TAKE 1 TABLET(25 MG) BY MOUTH DAILY  . lansoprazole (PREVACID) 30 MG capsule Take 1 capsule (30 mg total) by mouth daily at 12 noon.  [name] levothyroxine (SYNTHROID) 112 MCG tablet TAKE 1 TABLET(112 MCG) BY MOUTH DAILY  . metoprolol succinate (TOPROL-XL) 100 MG 24 hr tablet Take 1 tablet by mouth daily.  . Olmesartan-amLODIPine-HCTZ 40-5-12.5 MG TABS Take one tablet daily.  [name] Glycol-Propyl Glycol (SYSTANE ULTRA OP) Place 1 drop into both eyes 2 (two) times daily.   . raloxifene (EVISTA) 60 MG tablet TAKE 1 TABLET(60 MG) BY MOUTH  DAILY  . triamcinolone cream (KENALOG) 0.1 % apply to affected area twice a day AVOID FACE  . XARELTO 20 MG TABS tablet Take 1 tablet by mouth daily.   No facility-administered encounter medications on file as of 06/30/2019.    Activities of Daily Living In your present state of health, do you have any difficulty performing the following activities: 03/22/2019  Hearing? Y  Vision? N  Difficulty concentrating or making decisions? N  Walking or climbing stairs? Y  Dressing or bathing? N  Doing errands, shopping? N  Some recent data might be  hidden    Patient Care Team: Venita Lick, NP as PCP - General (Nurse Practitioner) Dionisio David, MD as Consulting Physician (Cardiology) Cammie Sickle, MD as Consulting Physician (Internal Medicine)    Assessment:   This is a routine wellness examination for [place].  Exercise Activities and Dietary recommendations    Goals Addressed   None     Fall Risk: Fall Risk  03/22/2019 05/31/2016 05/10/2015 12/14/2014 08/17/2014  Falls in the past year? 0 No No No No  Number falls in past yr: 0 - - - -  Injury with Fall? 0 - - - -  Follow up Falls evaluation completed - - - -    FALL RISK PREVENTION PERTAINING TO THE HOME:  Any stairs in or around the home? Yes  going in home  If so, are there any without handrails? No   Home free of loose throw rugs in walkways, pet beds, electrical cords, etc? Yes  Adequate lighting in your home to reduce risk of falls? Yes   ASSISTIVE DEVICES UTILIZED TO PREVENT FALLS:  Life alert? No  Use of a cane, walker or w/c? no Grab bars in the bathroom? No  Shower chair or bench in shower? No  Elevated toilet seat or a handicapped toilet? No   DME ORDERS:  DME order needed?  No   TIMED UP AND GO:  Unable to perform    Depression Screen PHQ 2/9 Scores 03/22/2019 10/02/2017 05/31/2016 05/10/2015  PHQ - 2 Score 0 0 0 0  PHQ- 9 Score - 0 3 -     Cognitive Function        Immunization  History  Administered Date(s) Administered  . Influenza, High Dose Seasonal PF 12/19/2015, 12/10/2016, 02/08/2018  . Influenza,inj,Quad PF,6+ Mos 11/25/2014  . Influenza-Unspecified 11/29/2013, 12/10/2016  . PFIZER SARS-COV-2 Vaccination 05/11/2019, 06/08/2019  . Pneumococcal Conjugate-13 03/02/2014  . Pneumococcal Polysaccharide-23 01/18/2009, 03/08/2015  . Tdap 09/20/2011  . Zoster 11/29/2013    Qualifies for Shingles Vaccine? Yes  Zostavax completed 2015. Due for Shingrix. Education has been provided regarding the importance of this vaccine. Pt has been advised to call insurance company to determine out of pocket expense. Advised may also receive vaccine at local pharmacy or Health Dept. Verbalized acceptance and understanding.  Tdap: up to date   Flu Vaccine: due 10/2019  Pneumococcal Vaccine: up to date   Covid-19 Vaccine:  Completed vaccines  Screening Tests Health Maintenance  Topic Date Due  . MAMMOGRAM  08/25/2011  . OPHTHALMOLOGY EXAM  04/15/2017  . INFLUENZA VACCINE  09/19/2019  . HEMOGLOBIN A1C  12/24/2019  . FOOT EXAM  03/21/2020  . COLONOSCOPY  07/31/2020  . TETANUS/TDAP  09/19/2021  . DEXA SCAN  Completed  . COVID-19 Vaccine  Completed  . Hepatitis C Screening  Completed  . PNA vac Low Risk Adult  Completed    Cancer Screenings:  Colorectal Screening: Completed 2017 Repeat every 5 years  Mammogram: declined   Bone Density: Completed 2014.  Lung Cancer Screening: (Low Dose CT Chest recommended if Age 45-80 years, 30 pack-year currently smoking OR have quit w/in 15years.) does not qualify.     Additional Screening:  Hepatitis C Screening: does qualify; Completed 2017  Vision Screening: Recommended annual ophthalmology exams for early detection of glaucoma and other disorders of the eye. Is the patient up to date with their annual eye exam?  Yes  Who is the provider or what is the name of the office in which the  pt attends annual eye exams? Dr.Bell      Dental Screening: Recommended annual dental exams for proper oral hygiene  Community Resource Referral:  CRR required this visit?  No       Plan:  I have personally reviewed and addressed the Medicare Annual Wellness questionnaire and have noted the following in the patient's chart:  A. Medical and social history B. Use of alcohol, tobacco or illicit drugs  C. Current medications and supplements D. Functional ability and status E.  Nutritional status F.  Physical activity G. Advance directives H. List of other physicians I.  Hospitalizations, surgeries, and ER visits in previous 12 months J.  [place] such as hearing and vision if needed, cognitive and depression L. Referrals and appointments   In addition, I have reviewed and discussed with patient certain preventive protocols, quality metrics, and best practice recommendations. A written personalized care plan for preventive services as well as general preventive health recommendations were provided to patient.  Signed,    Bevelyn Ngo, LPN  [id number] Nurse Health Advisor   Nurse Notes: none

## 2019-06-30 NOTE — Patient Instructions (Addendum)
Erica Larsen , Thank you for taking time to come for your Medicare Wellness Visit. I appreciate your ongoing commitment to your health goals. Please review the following plan we discussed and let me know if I can assist you in the future.   Screening recommendations/referrals: Colonoscopy: completed 2017, due 2022 Mammogram: declined  Bone Density: completed 2014 Recommended yearly ophthalmology/optometry visit for glaucoma screening and checkup Recommended yearly dental visit for hygiene and checkup  Vaccinations: Influenza vaccine: due 10/2019 Pneumococcal vaccine: completed  Tdap vaccine: up to date Shingles vaccine: shingrix eligible    Covid-19: completed   Advanced directives: Advance directive discussed with you today. Once this is complete please bring a copy in to our office so we can scan it into your chart.  Conditions/risks identified: diabetic, please schedule diabetic eye exam.   Next appointment: Follow up in one year for your annual wellness visit.    Preventive Care 71 Years and Older, Female Preventive care refers to lifestyle choices and visits with your health care provider that can promote health and wellness. What does preventive care include?  A yearly physical exam. This is also called an annual well check.  Dental exams once or twice a year.  Routine eye exams. Ask your health care provider how often you should have your eyes checked.  Personal lifestyle choices, including:  Daily care of your teeth and gums.  Regular physical activity.  Eating a healthy diet.  Avoiding tobacco and drug use.  Limiting alcohol use.  Practicing safe sex.  Taking low-dose aspirin every day.  Taking vitamin and mineral supplements as recommended by your health care provider. What happens during an annual well check? The services and screenings done by your health care provider during your annual well check will depend on your age, overall health, lifestyle risk  factors, and family history of disease. Counseling  Your health care provider may ask you questions about your:  Alcohol use.  Tobacco use.  Drug use.  Emotional well-being.  Home and relationship well-being.  Sexual activity.  Eating habits.  History of falls.  Memory and ability to understand (cognition).  Work and work Statistician.  Reproductive health. Screening  You may have the following tests or measurements:  Height, weight, and BMI.  Blood pressure.  Lipid and cholesterol levels. These may be checked every 5 years, or more frequently if you are over 71 years old.  Skin check.  Lung cancer screening. You may have this screening every year starting at age 71 if you have a 30-pack-year history of smoking and currently smoke or have quit within the past 15 years.  Fecal occult blood test (FOBT) of the stool. You may have this test every year starting at age 71.  Flexible sigmoidoscopy or colonoscopy. You may have a sigmoidoscopy every 5 years or a colonoscopy every 10 years starting at age 71.  Hepatitis C blood test.  Hepatitis B blood test.  Sexually transmitted disease (STD) testing.  Diabetes screening. This is done by checking your blood sugar (glucose) after you have not eaten for a while (fasting). You may have this done every 1-3 years.  Bone density scan. This is done to screen for osteoporosis. You may have this done starting at age 71.  Mammogram. This may be done every 1-2 years. Talk to your health care provider about how often you should have regular mammograms. Talk with your health care provider about your test results, treatment options, and if necessary, the need for more tests.  Vaccines  Your health care provider may recommend certain vaccines, such as:  Influenza vaccine. This is recommended every year.  Tetanus, diphtheria, and acellular pertussis (Tdap, Td) vaccine. You may need a Td booster every 10 years.  Zoster vaccine. You  may need this after age 71.  Pneumococcal 13-valent conjugate (PCV13) vaccine. One dose is recommended after age 71.  Pneumococcal polysaccharide (PPSV23) vaccine. One dose is recommended after age 71. Talk to your health care provider about which screenings and vaccines you need and how often you need them. This information is not intended to replace advice given to you by your health care provider. Make sure you discuss any questions you have with your health care provider. Document Released: 03/03/2015 Document Revised: 10/25/2015 Document Reviewed: 12/06/2014 Elsevier Interactive Patient Education  2017 [place] Prevention in the Home Falls can cause injuries. They can happen to people of all ages. There are many things you can do to make your home safe and to help prevent falls. What can I do on the outside of my home?  Regularly fix the edges of walkways and driveways and fix any cracks.  Remove anything that might make you trip as you walk through a door, such as a raised step or threshold.  Trim any bushes or trees on the path to your home.  Use bright outdoor lighting.  Clear any walking paths of anything that might make someone trip, such as rocks or tools.  Regularly check to see if handrails are loose or broken. Make sure that both sides of any steps have handrails.  Any raised decks and porches should have guardrails on the edges.  Have any leaves, snow, or ice cleared regularly.  Use sand or salt on walking paths during winter.  Clean up any spills in your garage right away. This includes oil or grease spills. What can I do in the bathroom?  Use night lights.  Install grab bars by the toilet and in the tub and shower. Do not use towel bars as grab bars.  Use non-skid mats or decals in the tub or shower.  If you need to sit down in the shower, use a plastic, non-slip stool.  Keep the floor dry. Clean up any water that spills on the floor as soon as  it happens.  Remove soap buildup in the tub or shower regularly.  Attach bath mats securely with double-sided non-slip rug tape.  Do not have throw rugs and other things on the floor that can make you trip. What can I do in the bedroom?  Use night lights.  Make sure that you have a light by your bed that is easy to reach.  Do not use any sheets or blankets that are too big for your bed. They should not hang down onto the floor.  Have a firm chair that has side arms. You can use this for support while you get dressed.  Do not have throw rugs and other things on the floor that can make you trip. What can I do in the kitchen?  Clean up any spills right away.  Avoid walking on wet floors.  Keep items that you use a lot in easy-to-reach places.  If you need to reach something above you, use a strong step stool that has a grab bar.  Keep electrical cords out of the way.  Do not use floor polish or wax that makes floors slippery. If you must use wax, use non-skid floor wax.  Do not have throw rugs and other things on the floor that can make you trip. What can I do with my stairs?  Do not leave any items on the stairs.  Make sure that there are handrails on both sides of the stairs and use them. Fix handrails that are broken or loose. Make sure that handrails are as long as the stairways.  Check any carpeting to make sure that it is firmly attached to the stairs. Fix any carpet that is loose or worn.  Avoid having throw rugs at the top or bottom of the stairs. If you do have throw rugs, attach them to the floor with carpet tape.  Make sure that you have a light switch at the top of the stairs and the bottom of the stairs. If you do not have them, ask someone to add them for you. What else can I do to help prevent falls?  Wear shoes that:  Do not have high heels.  Have rubber bottoms.  Are comfortable and fit you well.  Are closed at the toe. Do not wear sandals.  If  you use a stepladder:  Make sure that it is fully opened. Do not climb a closed stepladder.  Make sure that both sides of the stepladder are locked into place.  Ask someone to hold it for you, if possible.  Clearly mark and make sure that you can see:  Any grab bars or handrails.  First and last steps.  Where the edge of each step is.  Use tools that help you move around (mobility aids) if they are needed. These include:  Canes.  Walkers.  Scooters.  Crutches.  Turn on the lights when you go into a dark area. Replace any light bulbs as soon as they burn out.  Set up your furniture so you have a clear path. Avoid moving your furniture around.  If any of your floors are uneven, fix them.  If there are any pets around you, be aware of where they are.  Review your medicines with your doctor. Some medicines can make you feel dizzy. This can increase your chance of falling. Ask your doctor what other things that you can do to help prevent falls. This information is not intended to replace advice given to you by your health care provider. Make sure you discuss any questions you have with your health care provider. Document Released: 12/01/2008 Document Revised: 07/13/2015 Document Reviewed: 03/11/2014 Elsevier Interactive Patient Education  2017 [employer].

## 2019-07-07 ENCOUNTER — Other Ambulatory Visit (INDEPENDENT_AMBULATORY_CARE_PROVIDER_SITE_OTHER): Payer: Self-pay | Admitting: Nephrology

## 2019-07-07 DIAGNOSIS — N184 Chronic kidney disease, stage 4 (severe): Secondary | ICD-10-CM

## 2019-07-08 ENCOUNTER — Ambulatory Visit (INDEPENDENT_AMBULATORY_CARE_PROVIDER_SITE_OTHER): Payer: PRIVATE HEALTH INSURANCE

## 2019-07-08 ENCOUNTER — Other Ambulatory Visit: Payer: Self-pay

## 2019-07-08 DIAGNOSIS — N184 Chronic kidney disease, stage 4 (severe): Secondary | ICD-10-CM

## 2019-08-04 ENCOUNTER — Other Ambulatory Visit: Payer: Self-pay

## 2019-08-04 ENCOUNTER — Inpatient Hospital Stay: Payer: PRIVATE HEALTH INSURANCE | Attending: Internal Medicine

## 2019-08-04 DIAGNOSIS — D539 Nutritional anemia, unspecified: Secondary | ICD-10-CM

## 2019-08-04 DIAGNOSIS — I509 Heart failure, unspecified: Secondary | ICD-10-CM | POA: Diagnosis not present

## 2019-08-04 DIAGNOSIS — E119 Type 2 diabetes mellitus without complications: Secondary | ICD-10-CM | POA: Insufficient documentation

## 2019-08-04 DIAGNOSIS — N184 Chronic kidney disease, stage 4 (severe): Secondary | ICD-10-CM | POA: Diagnosis not present

## 2019-08-04 DIAGNOSIS — M1990 Unspecified osteoarthritis, unspecified site: Secondary | ICD-10-CM | POA: Insufficient documentation

## 2019-08-04 DIAGNOSIS — Z8616 Personal history of COVID-19: Secondary | ICD-10-CM | POA: Diagnosis not present

## 2019-08-04 DIAGNOSIS — D509 Iron deficiency anemia, unspecified: Secondary | ICD-10-CM | POA: Insufficient documentation

## 2019-08-04 DIAGNOSIS — R5383 Other fatigue: Secondary | ICD-10-CM | POA: Diagnosis not present

## 2019-08-04 DIAGNOSIS — E039 Hypothyroidism, unspecified: Secondary | ICD-10-CM | POA: Insufficient documentation

## 2019-08-04 DIAGNOSIS — I129 Hypertensive chronic kidney disease with stage 1 through stage 4 chronic kidney disease, or unspecified chronic kidney disease: Secondary | ICD-10-CM | POA: Insufficient documentation

## 2019-08-04 DIAGNOSIS — Z79899 Other long term (current) drug therapy: Secondary | ICD-10-CM | POA: Diagnosis not present

## 2019-08-04 DIAGNOSIS — K449 Diaphragmatic hernia without obstruction or gangrene: Secondary | ICD-10-CM | POA: Insufficient documentation

## 2019-08-04 DIAGNOSIS — K219 Gastro-esophageal reflux disease without esophagitis: Secondary | ICD-10-CM | POA: Insufficient documentation

## 2019-08-04 DIAGNOSIS — Z7901 Long term (current) use of anticoagulants: Secondary | ICD-10-CM | POA: Diagnosis not present

## 2019-08-04 DIAGNOSIS — E538 Deficiency of other specified B group vitamins: Secondary | ICD-10-CM | POA: Insufficient documentation

## 2019-08-04 DIAGNOSIS — I2510 Atherosclerotic heart disease of native coronary artery without angina pectoris: Secondary | ICD-10-CM | POA: Insufficient documentation

## 2019-08-04 DIAGNOSIS — R531 Weakness: Secondary | ICD-10-CM | POA: Diagnosis not present

## 2019-08-04 DIAGNOSIS — D500 Iron deficiency anemia secondary to blood loss (chronic): Secondary | ICD-10-CM

## 2019-08-04 DIAGNOSIS — E7800 Pure hypercholesterolemia, unspecified: Secondary | ICD-10-CM | POA: Insufficient documentation

## 2019-08-04 DIAGNOSIS — M8580 Other specified disorders of bone density and structure, unspecified site: Secondary | ICD-10-CM | POA: Diagnosis not present

## 2019-08-04 DIAGNOSIS — E669 Obesity, unspecified: Secondary | ICD-10-CM | POA: Insufficient documentation

## 2019-08-04 LAB — BASIC METABOLIC PANEL
Anion gap: 11 (ref 5–15)
BUN: 43 mg/dL — ABNORMAL HIGH (ref 8–23)
CO2: 29 mmol/L (ref 22–32)
Calcium: 9.1 mg/dL (ref 8.9–10.3)
Chloride: 99 mmol/L (ref 98–111)
Creatinine, Ser: 2.43 mg/dL — ABNORMAL HIGH (ref 0.44–1.00)
GFR calc Af Amer: 22 mL/min — ABNORMAL LOW (ref >60)
GFR calc non Af Amer: 19 mL/min — ABNORMAL LOW (ref >60)
Glucose, Bld: 134 mg/dL — ABNORMAL HIGH (ref 70–99)
Potassium: 4.2 mmol/L (ref 3.5–5.1)
Sodium: 139 mmol/L (ref 135–145)

## 2019-08-04 LAB — CBC WITH DIFFERENTIAL/PLATELET
Abs Immature Granulocytes: 0.03 10*3/uL (ref 0.00–0.07)
Basophils Absolute: 0 10*3/uL (ref 0.0–0.1)
Basophils Relative: 0 %
Eosinophils Absolute: 0.2 10*3/uL (ref 0.0–0.5)
Eosinophils Relative: 3 %
HCT: 37 % (ref 36.0–46.0)
Hemoglobin: 12.3 g/dL (ref 12.0–15.0)
Immature Granulocytes: 0 %
Lymphocytes Relative: 20 %
Lymphs Abs: 1.4 10*3/uL (ref 0.7–4.0)
MCH: 33.4 pg (ref 26.0–34.0)
MCHC: 33.2 g/dL (ref 30.0–36.0)
MCV: 100.5 fL — ABNORMAL HIGH (ref 80.0–100.0)
Monocytes Absolute: 0.8 10*3/uL (ref 0.1–1.0)
Monocytes Relative: 12 %
Neutro Abs: 4.4 10*3/uL (ref 1.7–7.7)
Neutrophils Relative %: 65 %
Platelets: 159 10*3/uL (ref 150–400)
RBC: 3.68 MIL/uL — ABNORMAL LOW (ref 3.87–5.11)
RDW: 13.8 % (ref 11.5–15.5)
WBC: 6.8 10*3/uL (ref 4.0–10.5)
nRBC: 0 % (ref 0.0–0.2)

## 2019-08-04 LAB — IRON AND TIBC
Iron: 65 ug/dL (ref 28–170)
Saturation Ratios: 16 % (ref 10.4–31.8)
TIBC: 410 ug/dL (ref 250–450)
UIBC: 345 ug/dL

## 2019-08-04 LAB — FERRITIN: Ferritin: 137 ng/mL (ref 11–307)

## 2019-08-04 LAB — VITAMIN B12: Vitamin B-12: 164 pg/mL — ABNORMAL LOW (ref 180–914)

## 2019-08-06 ENCOUNTER — Inpatient Hospital Stay: Payer: PRIVATE HEALTH INSURANCE

## 2019-08-06 ENCOUNTER — Inpatient Hospital Stay (HOSPITAL_BASED_OUTPATIENT_CLINIC_OR_DEPARTMENT_OTHER): Payer: PRIVATE HEALTH INSURANCE | Admitting: Internal Medicine

## 2019-08-06 ENCOUNTER — Other Ambulatory Visit: Payer: Self-pay

## 2019-08-06 DIAGNOSIS — N183 Chronic kidney disease, stage 3 unspecified: Secondary | ICD-10-CM | POA: Diagnosis not present

## 2019-08-06 DIAGNOSIS — D631 Anemia in chronic kidney disease: Secondary | ICD-10-CM

## 2019-08-06 DIAGNOSIS — D500 Iron deficiency anemia secondary to blood loss (chronic): Secondary | ICD-10-CM

## 2019-08-06 DIAGNOSIS — D510 Vitamin B12 deficiency anemia due to intrinsic factor deficiency: Secondary | ICD-10-CM

## 2019-08-06 DIAGNOSIS — D539 Nutritional anemia, unspecified: Secondary | ICD-10-CM

## 2019-08-06 DIAGNOSIS — D509 Iron deficiency anemia, unspecified: Secondary | ICD-10-CM | POA: Diagnosis not present

## 2019-08-06 MED ORDER — CYANOCOBALAMIN 1000 MCG/ML IJ SOLN: 1000.0000 ug | Freq: Once | INTRAMUSCULAR | Status: DC

## 2019-08-06 NOTE — Patient Instructions (Signed)
#   B12 1035mcg [1mg ] pill once a day- preferably sublingual tablet.

## 2019-08-06 NOTE — Assessment & Plan Note (Addendum)
Iron deficiency anemia- unclear etiology; question GI blood loss/CKD [hx large hiatal hernia status post recent fundoplication].  Hemoglobin today is 12.3.  Hold off IV iron.  # B12 deficiency-low B12-June 2021; recommend sublingual B12  # CHF [Dr.Khan]- increased the diuretic- better.   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   # DISPOSITION:   # HOLD  IV Ferrahem today.  # follow up in 6 months/ MD/possible IV ferreham; B12 injection-   labs-/b12; cbc/bmp/iron/ ferritin few days prior- Dr.B  Cc; Dr.Khan/Singh

## 2019-08-06 NOTE — Progress Notes (Signed)
[facility] NOTE  Patient Care Team: Venita Lick, NP as PCP - General (Nurse Practitioner) Dionisio David, MD as Consulting Physician (Cardiology) Cammie Sickle, MD as Consulting Physician (Internal Medicine)  CHIEF COMPLAINTS/PURPOSE OF CONSULTATION:   # 2014- Iron def Anemia-  [Sep 2014 EGD/COLO- NEG/ferritin 6; Dr.Wohl]. 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   # Hx CHF/CAD- asprin-plavix; Sep 2018- s/p hiatal hernia-fundoplication [Dr.Pabon]  HISTORY OF PRESENTING ILLNESS:  Erica Larsen 69 y.o.  female patient with above history of iron deficiency anemia of unclear etiology is here status post IV iron in November 2016 is here for follow-up.   In the interim patient was evaluated by cardiology, states to have a dose of diuretic increase.  Her breathing is improved.  No swelling of the legs.  No nausea vomiting.  Patient feels tired.  .Review of Systems  Constitutional: Positive for malaise/fatigue. Negative for chills, diaphoresis, fever and weight loss.  HENT: Negative for nosebleeds and sore throat.   Eyes: Negative for double vision.  Respiratory: Negative for cough, hemoptysis, sputum production, shortness of breath and wheezing.   Cardiovascular: Negative for chest pain, palpitations, orthopnea and leg swelling.  Gastrointestinal: Negative for abdominal pain, blood in stool, constipation, diarrhea, heartburn, melena, nausea and vomiting.  Genitourinary: Negative for dysuria, frequency and urgency.  Musculoskeletal: Positive for back pain and joint pain.  Skin: Negative.  Negative for itching and rash.  Neurological: Negative for dizziness, tingling, focal weakness, weakness and headaches.  Endo/Heme/Allergies: Does not bruise/bleed easily.  Psychiatric/Behavioral: Negative for depression. The patient is not nervous/anxious  and does not have insomnia.      MEDICAL HISTORY:  Past Medical History:  Diagnosis Date  . Anemia 2017  . Arthritis    osteoarthritis  . CHF (congestive heart failure) ([place])   . Chronic kidney disease 2017   stage 3  . Chronic kidney disease    stage 4   . Coronary artery disease   . COVID-19 virus detected 08/14/2018  . Diabetes mellitus without complication ([place])   . Diverticulosis   . Esophagitis   . GERD (gastroesophageal reflux disease)   . Hiatal hernia   . High cholesterol   . Hypercalcemia   . Hypertension   . Hypothyroidism   . Lumbar radiculopathy   . Obesity   . Osteopenia    Hips  . Thyroid disease     SURGICAL HISTORY: Past Surgical History:  Procedure Laterality Date  . CATARACT EXTRACTION Bilateral    May - June 2018  . COLONOSCOPY WITH PROPOFOL N/A 08/01/2015   Procedure: COLONOSCOPY WITH PROPOFOL;  Surgeon: Lucilla Lame, MD;  Location: [facility] ENDOSCOPY;  Service: Endoscopy;  Laterality: N/A;  . CORONARY ANGIOPLASTY WITH STENT PLACEMENT    . ESOPHAGOGASTRODUODENOSCOPY (EGD) WITH PROPOFOL N/A 05/14/2016   Procedure: ESOPHAGOGASTRODUODENOSCOPY (EGD) WITH PROPOFOL;  Surgeon: Lucilla Lame, MD;  Location: [facility] ENDOSCOPY;  Service: Endoscopy;  Laterality: N/A;  . GIVENS CAPSULE STUDY N/A 06/01/2015   Procedure: GIVENS CAPSULE STUDY;  Surgeon: Lucilla Lame, MD;  Location: [facility] ENDOSCOPY;  Service: Endoscopy;  Laterality: N/A;  . HIATAL HERNIA REPAIR N/A 09/24/2016   Procedure: LAPAROSCOPIC REPAIR OF HIATAL HERNIA WITH FUNDOPLICATION WITH MESH (BIO-A);  Surgeon: Jules Husbands, MD;  Location: [facility] ORS;  Service: General;  Laterality: N/A;  Lithotomy position w arms tucked.   . pre cancer removed     left forearm  . squamous  cell carcinoma surgery      SOCIAL HISTORY: Social History   Socioeconomic History  . Marital status: Married    Spouse name: Not on file  . Number of children: Not on file  . Years of education: Not on file  . Highest education level: Not  on file  Occupational History  . Not on file  Tobacco Use  . Smoking status: Never Smoker  . Smokeless tobacco: Never Used  Vaping Use  . Vaping Use: Never used  Substance and Sexual Activity  . Alcohol use: No  . Drug use: No  . Sexual activity: Yes    Birth control/protection: Post-menopausal  Other Topics Concern  . Not on file  Social History Narrative  . Not on file   Social Determinants of Health   Financial Resource Strain: Low Risk   . Difficulty of Paying Living Expenses: Not hard at all  Food Insecurity: No Food Insecurity  . Worried About Charity fundraiser in the Last Year: Never true  . Ran Out of Food in the Last Year: Never true  Transportation Needs: No Transportation Needs  . Lack of Transportation (Medical): No  . Lack of Transportation (Non-Medical): No  Physical Activity:   . Days of Exercise per Week:   . Minutes of Exercise per Session:   Stress:   . Feeling of Stress :   Social Connections: Socially Isolated  . Frequency of Communication with Friends and Family: More than three times a week  . Frequency of Social Gatherings with Friends and Family: More than three times a week  . Attends Religious Services: Never  . Active Member of Clubs or Organizations: No  . Attends Archivist Meetings: Never  . Marital Status: Never married  Intimate Partner Violence:   . Fear of Current or Ex-Partner:   . Emotionally Abused:   [name] Physically Abused:   . Sexually Abused:     FAMILY HISTORY: Family History  Problem Relation Age of Onset  . Heart disease Mother   . Kidney failure Mother   . Hypertension Mother   . Diabetes Mother   . Heart attack Father   . Hypertension Father   . Thyroid disease Father   . Hypertension Sister   . Diabetes Sister   . Thyroid disease Daughter   . Thyroid disease Daughter     ALLERGIES:  is allergic to sulfa antibiotics and latex.  MEDICATIONS:  Current Outpatient Medications  Medication Sig Dispense  Refill  . amiodarone (PACERONE) 200 MG tablet Take 200 mg by mouth daily.    [name] atorvastatin (LIPITOR) 80 MG tablet Take 80 mg by mouth daily after breakfast.   0  . Ferrous Sulfate (SLOW FE PO) Take 1 tablet by mouth daily.    . furosemide (LASIX) 40 MG tablet Take 40 mg by mouth daily.    . hydrALAZINE (APRESOLINE) 25 MG tablet Take 1 tablet by mouth 2 (two) times daily.     . lansoprazole (PREVACID) 30 MG capsule Take 1 capsule (30 mg total) by mouth daily at 12 noon. 90 capsule 3  . levothyroxine (SYNTHROID) 112 MCG tablet TAKE 1 TABLET(112 MCG) BY MOUTH DAILY 90 tablet 2  . metoprolol succinate (TOPROL-XL) 100 MG 24 hr tablet Take 1 tablet by mouth daily.    . Olmesartan-amLODIPine-HCTZ 40-5-12.5 MG TABS Take one tablet daily. 90 tablet   . Polyethyl Glycol-Propyl Glycol (SYSTANE ULTRA OP) Place 1 drop into both eyes 2 (two) times daily.     [name]  raloxifene (EVISTA) 60 MG tablet TAKE 1 TABLET(60 MG) BY MOUTH DAILY 90 tablet 2  . triamcinolone cream (KENALOG) 0.1 % apply to affected area twice a day AVOID FACE  0  . XARELTO 20 MG TABS tablet Take 1 tablet by mouth daily.    [name] JANUVIA 25 MG tablet TAKE 1 TABLET(25 MG) BY MOUTH DAILY 90 tablet 0   No current facility-administered medications for this visit.      [name]  PHYSICAL EXAMINATION: ECOG PERFORMANCE STATUS: 1 - Symptomatic but completely ambulatory  Vitals:   08/06/19 1304  Temp: (!) 96.8 F (36 C)   Filed Weights   08/06/19 1304  Weight: 196 lb 8 oz (89.1 kg)    Physical Exam HENT:     Head: Normocephalic and atraumatic.     Mouth/Throat:     Pharynx: No oropharyngeal exudate.  Eyes:     Pupils: Pupils are equal, round, and reactive to light.  Cardiovascular:     Rate and Rhythm: Normal rate and regular rhythm.  Pulmonary:     Effort: Pulmonary effort is normal. No respiratory distress.     Breath sounds: Normal breath sounds. No wheezing.  Abdominal:     General: Bowel sounds are normal. There is no distension.      Palpations: Abdomen is soft. There is no mass.     Tenderness: There is no abdominal tenderness. There is no guarding or rebound.  Musculoskeletal:        General: No tenderness. Normal range of motion.     Cervical back: Normal range of motion and neck supple.  Skin:    General: Skin is warm.  Neurological:     Mental Status: She is alert and oriented to person, place, and time.  Psychiatric:        Mood and Affect: Affect normal.      LABORATORY DATA:  I have reviewed the data as listed Lab Results  Component Value Date   WBC 6.8 08/04/2019   HGB 12.3 08/04/2019   HCT 37.0 08/04/2019   MCV 100.5 (H) 08/04/2019   PLT 159 08/04/2019   Recent Labs    02/03/19 1539 03/22/19 1030 08/04/19 1055  NA 137 139 139  K 4.4 4.5 4.2  CL 101 102 99  CO2 27 23 29   GLUCOSE 125* 101* 134*  BUN 29* 28* 43*  CREATININE 1.67* 1.73* 2.43*  CALCIUM 9.5 9.0 9.1  GFRNONAA 31* 29* 19*  GFRAA 36* 34* 22*  PROT 7.7 6.8  --   ALBUMIN 4.6 4.4  --   AST 36 32  --   ALT 34 30  --   ALKPHOS 65 73  --   BILITOT 0.8 0.4  --      ASSESSMENT & PLAN:  Anemia of chronic kidney failure, stage 3 (moderate) (HCC)  Iron deficiency anemia- unclear etiology; question GI blood loss/CKD [hx large hiatal hernia status post recent fundoplication].  Hemoglobin today is 12.3.  Hold off IV iron.  # B12 deficiency-low B12-June 2021; recommend sublingual B12  # CHF [Dr.Khan]- increased the diuretic- better.   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   # DISPOSITION:   # HOLD  IV Ferrahem today.  # follow up in 6 months/ MD/possible IV ferreham; B12 injection-   labs-/b12; cbc/bmp/iron/ ferritin few days prior- Dr.B  Cc; Dr.Khan/Singh    Cammie Sickle, MD 08/12/2019 3:20 PM

## 2019-08-12 ENCOUNTER — Other Ambulatory Visit: Payer: Self-pay | Admitting: Nurse Practitioner

## 2019-09-26 ENCOUNTER — Other Ambulatory Visit: Payer: Self-pay | Admitting: Nurse Practitioner

## 2019-09-26 DIAGNOSIS — M810 Age-related osteoporosis without current pathological fracture: Secondary | ICD-10-CM

## 2019-09-26 NOTE — Telephone Encounter (Signed)
Requested Prescriptions  Pending Prescriptions Disp Refills   raloxifene (EVISTA) 60 MG tablet [Pharmacy Med Name: RALOXIFENE 60MG  TABLETS] 90 tablet 2    Sig: TAKE 1 TABLET(60 MG) BY MOUTH DAILY     OB/GYN:  Selective Estrogen Receptor Modulators Passed - 09/26/2019  9:11 AM      Passed - Valid encounter within last 12 months    Recent Outpatient Visits          3 months ago Type 2 diabetes mellitus with stage 3b chronic kidney disease, without long-term current use of insulin ([place])   [place], Jolene T, NP   6 months ago Type 2 diabetes mellitus with stage 3a chronic kidney disease, without long-term current use of insulin ([place])   [place], [place] T, NP   9 months ago COVID-29 virus detected   [employer], [place] T, NP   12 months ago Type 2 diabetes mellitus with stage 3 chronic kidney disease, without long-term current use of insulin ([place])   [place], [place] T, NP   1 year ago Fever, unspecified fever cause   [place], Barbaraann Faster, NP      Future Appointments            In 1 week Cannady, Barbaraann Faster, NP [facility]   In 9 months  [facility]

## 2019-09-28 ENCOUNTER — Other Ambulatory Visit: Payer: Self-pay | Admitting: Nurse Practitioner

## 2019-09-28 DIAGNOSIS — E039 Hypothyroidism, unspecified: Secondary | ICD-10-CM

## 2019-09-28 NOTE — Telephone Encounter (Signed)
Requested Prescriptions  Pending Prescriptions Disp Refills  . levothyroxine (SYNTHROID) 112 MCG tablet [Pharmacy Med Name: LEVOTHYROXINE 0.112MG  (112MCG) TABS] 90 tablet 1    Sig: TAKE 1 TABLET(112 MCG) BY MOUTH DAILY     Endocrinology:  Hypothyroid Agents Failed - 09/28/2019  3:20 AM      Failed - TSH needs to be rechecked within 3 months after an abnormal result. Refill until TSH is due.      Passed - TSH in normal range and within 360 days    TSH  Date Value Ref Range Status  03/22/2019 1.550 0.450 - 4.500 uIU/mL Final         Passed - Valid encounter within last 12 months    Recent Outpatient Visits          3 months ago Type 2 diabetes mellitus with stage 3b chronic kidney disease, without long-term current use of insulin ([place])   [place], Jolene T, NP   6 months ago Type 2 diabetes mellitus with stage 3a chronic kidney disease, without long-term current use of insulin ([place])   [place], [place] T, NP   9 months ago COVID-35 virus detected   [employer], [place] T, NP   12 months ago Type 2 diabetes mellitus with stage 3 chronic kidney disease, without long-term current use of insulin ([place])   [place], [place] T, NP   1 year ago Fever, unspecified fever cause   [place], Barbaraann Faster, NP      Future Appointments            In 1 week Cannady, Barbaraann Faster, NP [facility]   In 9 months  [facility]

## 2019-10-05 ENCOUNTER — Ambulatory Visit (INDEPENDENT_AMBULATORY_CARE_PROVIDER_SITE_OTHER): Payer: PRIVATE HEALTH INSURANCE | Admitting: Nurse Practitioner

## 2019-10-05 ENCOUNTER — Encounter: Payer: Self-pay | Admitting: Nurse Practitioner

## 2019-10-05 ENCOUNTER — Other Ambulatory Visit: Payer: Self-pay

## 2019-10-05 VITALS — BP 116/69 | HR 51 | Temp 98.0°F | Wt 197.2 lb

## 2019-10-05 DIAGNOSIS — D631 Anemia in chronic kidney disease: Secondary | ICD-10-CM

## 2019-10-05 DIAGNOSIS — I5032 Chronic diastolic (congestive) heart failure: Secondary | ICD-10-CM

## 2019-10-05 DIAGNOSIS — I130 Hypertensive heart and chronic kidney disease with heart failure and stage 1 through stage 4 chronic kidney disease, or unspecified chronic kidney disease: Secondary | ICD-10-CM | POA: Diagnosis not present

## 2019-10-05 DIAGNOSIS — E1122 Type 2 diabetes mellitus with diabetic chronic kidney disease: Secondary | ICD-10-CM | POA: Diagnosis not present

## 2019-10-05 DIAGNOSIS — E1169 Type 2 diabetes mellitus with other specified complication: Secondary | ICD-10-CM

## 2019-10-05 DIAGNOSIS — Z6838 Body mass index (BMI) 38.0-38.9, adult: Secondary | ICD-10-CM

## 2019-10-05 DIAGNOSIS — E538 Deficiency of other specified B group vitamins: Secondary | ICD-10-CM

## 2019-10-05 DIAGNOSIS — N184 Chronic kidney disease, stage 4 (severe): Secondary | ICD-10-CM

## 2019-10-05 DIAGNOSIS — E785 Hyperlipidemia, unspecified: Secondary | ICD-10-CM

## 2019-10-05 DIAGNOSIS — D6869 Other thrombophilia: Secondary | ICD-10-CM

## 2019-10-05 DIAGNOSIS — I480 Paroxysmal atrial fibrillation: Secondary | ICD-10-CM

## 2019-10-05 LAB — BAYER DCA HB A1C WAIVED: HB A1C (BAYER DCA - WAIVED): 6.6 % (ref <7.0)

## 2019-10-05 NOTE — Assessment & Plan Note (Signed)
Chronic, with A1C at 6.6%, still below goal but trend upwards.  Continue current medication regimen, Januvia and monitoring BS at home regularly. May benefit from change to [facility] for HF, dependent on kidney function at visit today.  Recommend focus on diabetic diet at home.  Return in 3 months.

## 2019-10-05 NOTE — Assessment & Plan Note (Signed)
Chronic, with BP at goal today and on home BP readings.  Continue current medication regimen as prescribed by cardiology and collaboration with cardiology and nephrology.  BMP today.  Recommend she continue to monitor BP regularly at home and document + bring to visits.  DASH diet focus.  Return in 3 months.

## 2019-10-05 NOTE — Assessment & Plan Note (Signed)
BMI >35 with T2DM.  Recommended eating smaller high protein, low fat meals more frequently and exercising 30 mins a day 5 times a week with a goal of 10-15lb weight loss in the next 3 months. Patient voiced their understanding and motivation to adhere to these recommendations.

## 2019-10-05 NOTE — Progress Notes (Signed)
BP 116/69   Pulse (!) 51   Temp 98 F (36.7 C) (Oral)   Wt 197 lb 3.2 oz (89.4 kg)   SpO2 97%   BMI 37.26 kg/m    Subjective:    Patient ID: Erica Larsen, female    DOB: 12/26/1950, 69 y.o.   MRN: [id number]  HPI: Erica Larsen is a 69 y.o. female  Chief Complaint  Patient presents with  . Diabetes  . Hyperlipidemia  . Hypertension  . Congestive Heart Failure   DIABETES Last A1C in February was 5.8%. Continues on Januvia 25 MG (renal dosed).  Hypoglycemic episodes:no Polydipsia/polyuria:no Visual disturbance:no Chest pain:no Paresthesias:no Glucose Monitoring:rarely Accucheck frequency: rarely Fasting glucose: rarely Post prandial: Evening: Before meals: Taking Insulin?:no Long acting insulin: Short acting insulin: Blood Pressure Monitoring:daily Retinal Examination:Not up to Date Foot Exam:Up to Date Pneumovax:Up to Date Influenza:Up to Date Aspirin:no  ATRIAL FIBRILLATION Followed by cardiology. Atrial fibrillation status: stable Satisfied with current treatment: yes  Medication side effects:  no Medication compliance: good compliance Etiology of atrial fibrillation: unknown Palpitations:  no Chest pain:  no Dyspnea on exertion:  occasionally, baseline Orthopnea:  no Syncope:  no Edema:  no Ventricular rate control: B-blocker Anti-coagulation: long acting  HYPERTENSION / HYPERLIPIDEMIA/CHF Continues on Atorvastatin for HLD. She is followed by Dr. Humphrey Rolls, cardiology, last saw in August.  Continues on Olmesartan-Amlodipine-HCTZ, Metoprolol (recently increased to 100), Lasix -- she was taken off Hydralazine recent cardiology visit. Last Echo with EF 49% in May 2020. Satisfied with current treatment?yes Duration of hypertension:chronic BP monitoring frequency:rarely BP range:110/60-70 range BP medication side effects:no Duration of  hyperlipidemia:chronic Cholesterol medication side effects:no Cholesterol supplements: none Medication compliance:good compliance Aspirin:no Recent stressors:no Recurrent headaches:no Visual changes:no Palpitations:no Dyspnea:no Chest pain:no Lower extremity edema:no Dizzy/lightheaded:no  CHRONIC KIDNEY DISEASE Saw [facility], is due to see them next week. Is currently followed by hematology for chronic iron deficiency anemia with last visit on 08/06/19.  CKD status:stable Medications renally dose:yes Previous renal evaluation:yes Pneumovax:Up to Date Influenza Vaccine:Up to Date  Relevant past medical, surgical, family and social history reviewed and updated as indicated. Interim medical history since our last visit reviewed. Allergies and medications reviewed and updated.  Review of Systems  Constitutional: Negative for activity change, appetite change, diaphoresis, fatigue and fever.  Respiratory: Negative for cough, chest tightness and shortness of breath.   Cardiovascular: Negative for chest pain, palpitations and leg swelling.  Gastrointestinal: Negative.   Endocrine: Negative for cold intolerance, heat intolerance, polydipsia, polyphagia and polyuria.  Neurological: Negative.   Psychiatric/Behavioral: Negative.     Per HPI unless specifically indicated above     Objective:    BP 116/69   Pulse (!) 51   Temp 98 F (36.7 C) (Oral)   Wt 197 lb 3.2 oz (89.4 kg)   SpO2 97%   BMI 37.26 kg/m   Wt Readings from Last 3 Encounters:  10/05/19 197 lb 3.2 oz (89.4 kg)  08/06/19 196 lb 8 oz (89.1 kg)  06/30/19 201 lb (91.2 kg)    Physical Exam Vitals and nursing note reviewed.  Constitutional:      General: She is awake. She is not in acute distress.    Appearance: She is well-developed and well-groomed. She is morbidly obese. She is not ill-appearing.  HENT:     Head: Normocephalic.     Right Ear: Hearing normal.     Left Ear:  Hearing normal.  Eyes:     General: Lids are  normal.        Right eye: No discharge.        Left eye: No discharge.     Conjunctiva/sclera: Conjunctivae normal.     Pupils: Pupils are equal, round, and reactive to light.  Neck:     Thyroid: No thyromegaly.     Vascular: No carotid bruit.  Cardiovascular:     Rate and Rhythm: Normal rate and regular rhythm.     Heart sounds: Murmur heard.  Systolic murmur is present with a grade of 2/6.  No gallop.   Pulmonary:     Effort: Pulmonary effort is normal. No accessory muscle usage or respiratory distress.     Breath sounds: Normal breath sounds.  Abdominal:     General: Bowel sounds are normal.     Palpations: Abdomen is soft.  Musculoskeletal:     Cervical back: Normal range of motion and neck supple.     Right lower leg: 1+ Pitting Edema present.     Left lower leg: 1+ Pitting Edema present.  Skin:    General: Skin is warm and dry.  Neurological:     Mental Status: She is alert and oriented to person, place, and time.  Psychiatric:        Attention and Perception: Attention normal.        Mood and Affect: Mood normal.        Behavior: Behavior normal. Behavior is cooperative.        Thought Content: Thought content normal.        Judgment: Judgment normal.     Results for orders placed or performed in visit on 08/04/19  Vitamin B12  Result Value Ref Range   Vitamin B-12 164 (L) 180 - 914 pg/mL  Iron and TIBC  Result Value Ref Range   Iron 65 28 - 170 ug/dL   TIBC 410 250 - 450 ug/dL   Saturation Ratios 16 10.4 - 31.8 %   UIBC 345 ug/dL  Ferritin  Result Value Ref Range   Ferritin 137 11 - 307 ng/mL  Basic metabolic panel  Result Value Ref Range   Sodium 139 135 - 145 mmol/L   Potassium 4.2 3.5 - 5.1 mmol/L   Chloride 99 98 - 111 mmol/L   CO2 29 22 - 32 mmol/L   Glucose, Bld 134 (H) 70 - 99 mg/dL   BUN 43 (H) 8 - 23 mg/dL   Creatinine, Ser 2.43 (H) 0.44 - 1.00 mg/dL   Calcium 9.1 8.9 - 10.3 mg/dL   GFR calc non  Af Amer 19 (L) >60 mL/min   GFR calc Af Amer 22 (L) >60 mL/min   Anion gap 11 5 - 15  CBC with Differential  Result Value Ref Range   WBC 6.8 4.0 - 10.5 K/uL   RBC 3.68 (L) 3.87 - 5.11 MIL/uL   Hemoglobin 12.3 12.0 - 15.0 g/dL   HCT 37.0 36 - 46 %   MCV 100.5 (H) 80.0 - 100.0 fL   MCH 33.4 26.0 - 34.0 pg   MCHC 33.2 30.0 - 36.0 g/dL   RDW 13.8 11.5 - 15.5 %   Platelets 159 150 - 400 K/uL   nRBC 0.0 0.0 - 0.2 %   Neutrophils Relative % 65 %   Neutro Abs 4.4 1.7 - 7.7 K/uL   Lymphocytes Relative 20 %   Lymphs Abs 1.4 0.7 - 4.0 K/uL   Monocytes Relative 12 %   Monocytes Absolute 0.8 0 - 1 K/uL  Eosinophils Relative 3 %   Eosinophils Absolute 0.2 0 - 0 K/uL   Basophils Relative 0 %   Basophils Absolute 0.0 0 - 0 K/uL   Immature Granulocytes 0 %   Abs Immature Granulocytes 0.03 0.00 - 0.07 K/uL      Assessment & Plan:   Problem List Items Addressed This Visit      Cardiovascular and Mediastinum   Chronic diastolic heart failure (HCC)    Chronic, ongoing.  Continue collaboration with cardiology and current medication regimen.  Recommend: - Reminded to call for an overnight weight gain of >2 pounds or a weekly weight weight of >5 pounds - not adding salt to his food and has been reading food labels. 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  Continue current medication regimen as prescribed by cardiology and collaboration with cardiology and nephrology.  BMP today.  Recommend she continue to monitor BP regularly at home and document + bring to visits.  DASH diet focus.  Return in 3 months.      Relevant Orders   Basic metabolic panel   Atrial fibrillation (HCC)    Chronic, ongoing with rate controlled at this time.  Continue collaboration with cardiology and medication regimen as prescribed by Dr. Chancy Milroy.   Continue Xarelto and Metoprolol.          Endocrine   Hyperlipidemia associated with type 2 diabetes mellitus (HCC)    Chronic, ongoing.  Continue current medication regimen and adjust as needed.  Lipid panel today.      Relevant Orders   Bayer DCA Hb A1c Waived   Lipid Panel w/o Chol/HDL Ratio   Type 2 diabetes mellitus with stage 4 chronic kidney disease, without long-term current use of insulin (HCC) - Primary    Chronic, with A1C at 6.6%, still below goal but trend upwards.  Continue current medication regimen, Januvia and monitoring BS at home regularly. May benefit from change to [facility] for HF, dependent on kidney function at visit today.  Recommend focus on diabetic diet at home.  Return in 3 months.        Relevant Orders   Bayer DCA Hb A1c Waived     Genitourinary   Chronic kidney disease, stage 4 (severe) (HCC)    Chronic, followed by nephrology at this time.  Urine micro alb 10 and A:C 30-300 last visit.  Continue Olmesartan for kidney protection and monitor medications for renal dosing.  BMP today.        Hematopoietic and Hemostatic   Acquired thrombophilia ([place])    Secondary to Xarelto use.  Continue to monitor for bleeding and wounds.  Gentle skin care at home.  Followed by hematology for anemia.        Other   Morbid obesity (HCC)    BMI >35 with T2DM.  Recommended eating smaller high protein, low fat meals more frequently and exercising 30 mins a day 5 times a week with a goal of 10-15lb weight loss in the next 3 months. Patient voiced their understanding and motivation to adhere to these recommendations.       Anemia due to stage 4 chronic kidney disease (HCC)    Chronic, ongoing.  Followed by hematology and receiving iron transfusions.  Continue this collaboration, recent labs and note reviewed.  BMI 38.0-38.9,adult    Refer to morbid obesity plan for further.      B12 deficiency    Continue supplement and recheck B12 level today.       Relevant Orders   Vitamin B12       Follow up plan: Return in about 3 months (around 01/05/2020) for T2DM, HTN/HLD.

## 2019-10-05 NOTE — Assessment & Plan Note (Signed)
Continue supplement and recheck B12 level today.

## 2019-10-05 NOTE — Assessment & Plan Note (Signed)
Secondary to Xarelto use.  Continue to monitor for bleeding and wounds.  Gentle skin care at home.  Followed by hematology for anemia.

## 2019-10-05 NOTE — Assessment & Plan Note (Signed)
Chronic, followed by nephrology at this time.  Urine micro alb 10 and A:C 30-300 last visit.  Continue Olmesartan for kidney protection and monitor medications for renal dosing.  BMP today.

## 2019-10-05 NOTE — Assessment & Plan Note (Signed)
Chronic, ongoing.  Followed by hematology and receiving iron transfusions.  Continue this collaboration, recent labs and note reviewed.

## 2019-10-05 NOTE — Assessment & Plan Note (Signed)
Chronic, ongoing.  Continue current medication regimen and adjust as needed. Lipid panel today. 

## 2019-10-05 NOTE — Patient Instructions (Signed)
Chronic Kidney Disease, Adult Chronic kidney disease (CKD) happens when the kidneys are damaged over a long period of time. The kidneys are two organs that help with:  Getting rid of waste and extra fluid from the blood.  Making hormones that maintain the amount of fluid in your tissues and blood vessels.  Making sure that the body has the right amount of fluids and chemicals. Most of the time, CKD does not go away, but it can usually be controlled. Steps must be taken to slow down the kidney damage or to stop it from getting worse. If this is not done, the kidneys may stop working. Follow these instructions at home: Medicines  Take over-the-counter and prescription medicines only as told by your doctor. You may need to change the amount of medicines you take.  Do not take any new medicines unless your doctor says it is okay. Many medicines can make your kidney damage worse.  Do not take any vitamin and supplements unless your doctor says it is okay. Many vitamins and supplements can make your kidney damage worse. General instructions  Follow a diet as told by your doctor. You may need to stay away from: ? Alcohol. ? Salty foods. ? Foods that are high in:  Potassium.  Calcium.  Protein.  Do not use any products that contain nicotine or tobacco, such as cigarettes and e-cigarettes. If you need help quitting, ask your doctor.  Keep track of your blood pressure at home. Tell your doctor about any changes.  If you have diabetes, keep track of your blood sugar as told by your doctor.  Try to stay at a healthy weight. If you need help, ask your doctor.  Exercise at least 30 minutes a day, 5 days a week.  Stay up-to-date with your shots (immunizations) as told by your doctor.  Keep all follow-up visits as told by your doctor. This is important. Contact a doctor if:  Your symptoms get worse.  You have new symptoms. Get help right away if:  You have symptoms of end-stage  kidney disease. These may include: ? Headaches. ? Numbness in your hands or feet. ? Easy bruising. ? Having hiccups often. ? Chest pain. ? Shortness of breath. ? Stopping of menstrual periods in women.  You have a fever.  You have very little pee (urine).  You have pain or bleeding when you pee. Summary  Chronic kidney disease (CKD) happens when the kidneys are damaged over a long period of time.  Most of the time, this condition does not go away, but it can usually be controlled. Steps must be taken to slow down the kidney damage or to stop it from getting worse.  Treatment may include a combination of medicines and lifestyle changes. This information is not intended to replace advice given to you by your health care provider. Make sure you discuss any questions you have with your health care provider. Document Revised: 01/17/2017 Document Reviewed: 03/11/2016 Elsevier Patient Education  2020 [employer]  

## 2019-10-05 NOTE — Assessment & Plan Note (Signed)
Chronic, ongoing with rate controlled at this time.  Continue collaboration with cardiology and medication regimen as prescribed by Dr. Chancy Milroy.  Continue Xarelto and Metoprolol.

## 2019-10-05 NOTE — Assessment & Plan Note (Signed)
Chronic, ongoing.  Continue collaboration with cardiology and current medication regimen.  Recommend: - Reminded to call for an overnight weight gain of >2 pounds or a weekly weight weight of >5 pounds - not adding salt to his food and has been reading food labels. Reviewed the importance of keeping daily sodium intake to <2000mg daily  

## 2019-10-05 NOTE — Assessment & Plan Note (Signed)
Refer to morbid obesity plan for further.   °

## 2019-10-06 LAB — BASIC METABOLIC PANEL
BUN/Creatinine Ratio: 22 (ref 12–28)
BUN: 67 mg/dL — ABNORMAL HIGH (ref 8–27)
CO2: 26 mmol/L (ref 20–29)
Calcium: 9.5 mg/dL (ref 8.7–10.3)
Chloride: 97 mmol/L (ref 96–106)
Creatinine, Ser: 3.07 mg/dL — ABNORMAL HIGH (ref 0.57–1.00)
GFR calc Af Amer: 17 mL/min/{1.73_m2} — ABNORMAL LOW (ref >59)
GFR calc non Af Amer: 15 mL/min/{1.73_m2} — ABNORMAL LOW (ref >59)
Glucose: 115 mg/dL — ABNORMAL HIGH (ref 65–99)
Potassium: 4.9 mmol/L (ref 3.5–5.2)
Sodium: 136 mmol/L (ref 134–144)

## 2019-10-06 LAB — LIPID PANEL W/O CHOL/HDL RATIO
Cholesterol, Total: 116 mg/dL (ref 100–199)
HDL: 62 mg/dL (ref >39)
LDL Chol Calc (NIH): 41 mg/dL (ref 0–99)
Triglycerides: 55 mg/dL (ref 0–149)
VLDL Cholesterol Cal: 13 mg/dL (ref 5–40)

## 2019-10-06 LAB — VITAMIN B12: Vitamin B-12: 417 pg/mL (ref 232–1245)

## 2019-10-06 NOTE — Progress Notes (Signed)
Good morning, please let Erica Larsen know her labs have returned.  She continues to show stage 4 kidney disease with a little decline in your labs on this check, please ensure you are getting good water in daily + keep upcoming appointment with your kidney doctor.  Cholesterol levels continue to be at goal, keep taking your statin daily.  B12 level is improving, continue taking B12 vitamin daily.  If any questions let me know.  Have a great day!!

## 2019-11-14 ENCOUNTER — Other Ambulatory Visit: Payer: Self-pay | Admitting: Nurse Practitioner

## 2019-11-14 NOTE — Telephone Encounter (Signed)
Requested Prescriptions  Pending Prescriptions Disp Refills   lansoprazole (PREVACID) 30 MG capsule [Pharmacy Med Name: LANSOPRAZOLE 30MG  DR CAPSULES] 90 capsule 3    Sig: TAKE ONE CAPSULE BY MOUTH DAILY AT 12:00 NOON     Gastroenterology: Proton Pump Inhibitors Passed - 11/14/2019  3:31 PM      Passed - Valid encounter within last 12 months    Recent Outpatient Visits          1 month ago Type 2 diabetes mellitus with stage 4 chronic kidney disease, without long-term current use of insulin ([place])   [place] [facility], Jolene T, NP   4 months ago Type 2 diabetes mellitus with stage 3b chronic kidney disease, without long-term current use of insulin ([place])   [place], Jolene T, NP   7 months ago Type 2 diabetes mellitus with stage 3a chronic kidney disease, without long-term current use of insulin ([place])   [place], [place] T, NP   11 months ago COVID-47 virus detected   [employer], Willernie T, NP   1 year ago Type 2 diabetes mellitus with stage 3 chronic kidney disease, without long-term current use of insulin ([place])   [place], Barbaraann Faster, NP      Future Appointments            In 1 month Cann[place], Barbaraann Faster, NP [facility]   In 7 months  [facility]

## 2020-01-05 ENCOUNTER — Other Ambulatory Visit: Payer: Self-pay

## 2020-01-05 ENCOUNTER — Ambulatory Visit (INDEPENDENT_AMBULATORY_CARE_PROVIDER_SITE_OTHER): Payer: PRIVATE HEALTH INSURANCE | Admitting: Nurse Practitioner

## 2020-01-05 ENCOUNTER — Encounter: Payer: Self-pay | Admitting: Nurse Practitioner

## 2020-01-05 VITALS — BP 126/72 | HR 55 | Temp 97.6°F | Wt 206.6 lb

## 2020-01-05 DIAGNOSIS — Z6838 Body mass index (BMI) 38.0-38.9, adult: Secondary | ICD-10-CM

## 2020-01-05 DIAGNOSIS — E785 Hyperlipidemia, unspecified: Secondary | ICD-10-CM

## 2020-01-05 DIAGNOSIS — I480 Paroxysmal atrial fibrillation: Secondary | ICD-10-CM

## 2020-01-05 DIAGNOSIS — D631 Anemia in chronic kidney disease: Secondary | ICD-10-CM

## 2020-01-05 DIAGNOSIS — I5032 Chronic diastolic (congestive) heart failure: Secondary | ICD-10-CM | POA: Diagnosis not present

## 2020-01-05 DIAGNOSIS — D6869 Other thrombophilia: Secondary | ICD-10-CM

## 2020-01-05 DIAGNOSIS — N184 Chronic kidney disease, stage 4 (severe): Secondary | ICD-10-CM

## 2020-01-05 DIAGNOSIS — E1122 Type 2 diabetes mellitus with diabetic chronic kidney disease: Secondary | ICD-10-CM | POA: Diagnosis not present

## 2020-01-05 DIAGNOSIS — E1169 Type 2 diabetes mellitus with other specified complication: Secondary | ICD-10-CM | POA: Diagnosis not present

## 2020-01-05 DIAGNOSIS — I130 Hypertensive heart and chronic kidney disease with heart failure and stage 1 through stage 4 chronic kidney disease, or unspecified chronic kidney disease: Secondary | ICD-10-CM

## 2020-01-05 DIAGNOSIS — Z23 Encounter for immunization: Secondary | ICD-10-CM

## 2020-01-05 LAB — BAYER DCA HB A1C WAIVED: HB A1C (BAYER DCA - WAIVED): 5.8 % (ref <7.0)

## 2020-01-05 NOTE — Assessment & Plan Note (Signed)
Chronic, with A1C at 5.8%, still below goal.  Continue current medication regimen, Januvia and monitoring BS at home regularly. May benefit from change to [facility] for HF, dependent on kidney function at next visit.  Recommend focus on diabetic diet at home.  Return in 3 months.

## 2020-01-05 NOTE — Assessment & Plan Note (Signed)
BMI >35 with T2DM.  Recommended eating smaller high protein, low fat meals more frequently and exercising 30 mins a day 5 times a week with a goal of 10-15lb weight loss in the next 3 months. Patient voiced their understanding and motivation to adhere to these recommendations.

## 2020-01-05 NOTE — Assessment & Plan Note (Signed)
Chronic, ongoing.  Continue collaboration with cardiology and current medication regimen.  Recommend: - Reminded to call for an overnight weight gain of >2 pounds or a weekly weight weight of >5 pounds - not adding salt to his food and has been reading food labels. Reviewed the importance of keeping daily sodium intake to <2000mg daily  

## 2020-01-05 NOTE — Assessment & Plan Note (Signed)
Chronic, ongoing with rate controlled at this time.  Continue collaboration with cardiology and medication regimen as prescribed by Dr. Chancy Milroy.  Continue Xarelto and Metoprolol.

## 2020-01-05 NOTE — Assessment & Plan Note (Signed)
Secondary to Xarelto use.  Continue to monitor for bleeding and wounds.  Gentle skin care at home.  Followed by hematology for anemia.

## 2020-01-05 NOTE — Assessment & Plan Note (Signed)
Chronic, ongoing.  Continue current medication regimen and adjust as needed.  Lipid panel next visit.    

## 2020-01-05 NOTE — Assessment & Plan Note (Signed)
Chronic, ongoing.  Followed by hematology and receiving iron transfusions.  Continue this collaboration, recent labs and note reviewed.

## 2020-01-05 NOTE — Assessment & Plan Note (Signed)
Chronic, followed by nephrology at this time.  Urine micro alb 10 and A:C 30-300 last visit.  Continue Olmesartan for kidney protection and monitor medications for renal dosing.  BMP up to date.

## 2020-01-05 NOTE — Assessment & Plan Note (Signed)
Chronic, with BP at goal today on recheck and on home BP readings.  Continue current medication regimen as prescribed by cardiology and collaboration with cardiology and nephrology.  BMP up to date.  Recommend she continue to monitor BP regularly at home and document + bring to visits.  DASH diet focus.  Return in 3 months.

## 2020-01-05 NOTE — Assessment & Plan Note (Signed)
Refer to morbid obesity plan for further.   °

## 2020-01-05 NOTE — Progress Notes (Signed)
BP 126/72 (BP Location: Left Arm)    Pulse (!) 55    Temp 97.6 F (36.4 C)    Wt 206 lb 9.6 oz (93.7 kg)    SpO2 96%    BMI 39.04 kg/m    Subjective:    Patient ID: Erica Larsen, female    DOB: Jun 27, 1950, 69 y.o.   MRN: [id number]  HPI: Erica Larsen is a 69 y.o. female  Chief Complaint  Patient presents with   Diabetes   Hypertension   DIABETES Last A1C in August was 6.6%. Continues on Januvia 25 MG (renal dosed).  Hypoglycemic episodes:no Polydipsia/polyuria:no Visual disturbance:no Chest pain:no Paresthesias:no Glucose Monitoring:rarely Accucheck frequency: rarely Fasting glucose: often under 130 Post prandial: Evening: Before meals: Taking Insulin?:no Long acting insulin: Short acting insulin: Blood Pressure Monitoring:daily Retinal Examination:Not up to Date Foot Exam:Up to Date Pneumovax:Up to Date Influenza:Up to Date Aspirin:no  ATRIAL FIBRILLATION Followed by cardiology. Atrial fibrillation status: stable Satisfied with current treatment: yes  Medication side effects:  no Medication compliance: good compliance Etiology of atrial fibrillation: unknown Palpitations:  no Chest pain:  no Dyspnea on exertion:  occasionally, baseline Orthopnea:  no Syncope:  no Edema:  no Ventricular rate control: B-blocker Anti-coagulation: long acting  HYPERTENSION / HYPERLIPIDEMIA/CHF Continues on Atorvastatin for HLD. She is followed by Dr. Humphrey Rolls, cardiology, last saw 2 weeks ago.  Continues on Olmesartan-Amlodipine-HCTZ, Metoprolol (recently increased to 100), Lasix -- she was taken off Hydralazine recent cardiology visit. Last Echo with EF 49% in May 2020. Satisfied with current treatment?yes Duration of hypertension:chronic BP monitoring frequency:rarely BP range:110-120/60-70 range BP medication side effects:no Duration of  hyperlipidemia:chronic Cholesterol medication side effects:no Cholesterol supplements: none Medication compliance:good compliance Aspirin:no Recent stressors:no Recurrent headaches:no Visual changes:no Palpitations:no Dyspnea:no Chest pain:no Lower extremity edema:no Dizzy/lightheaded:no  CHRONIC KIDNEY DISEASE Saw [facility] saw on 12/01/19 -- GFR 29, CRT 1.76, BUN 34 -- added Lasix 40 MG in morning and 20 Mg at lunch.  Has history of IV contract exposure in June 2018.  Is currently followed by hematology for chronic iron deficiency anemia with last visit on 08/06/19 -- due to see them again next month -- has history of iron infusions.  CKD status:stable Medications renally dose:yes Previous renal evaluation:yes Pneumovax:Up to Date Influenza Vaccine:Up to Date  Relevant past medical, surgical, family and social history reviewed and updated as indicated. Interim medical history since our last visit reviewed. Allergies and medications reviewed and updated.  Review of Systems  Constitutional: Negative for activity change, appetite change, diaphoresis, fatigue and fever.  Respiratory: Negative for cough, chest tightness and shortness of breath.   Cardiovascular: Negative for chest pain, palpitations and leg swelling.  Gastrointestinal: Negative.   Endocrine: Negative for cold intolerance, heat intolerance, polydipsia, polyphagia and polyuria.  Neurological: Negative.   Psychiatric/Behavioral: Negative.     Per HPI unless specifically indicated above     Objective:    BP 126/72 (BP Location: Left Arm)    Pulse (!) 55    Temp 97.6 F (36.4 C)    Wt 206 lb 9.6 oz (93.7 kg)    SpO2 96%    BMI 39.04 kg/m   Wt Readings from Last 3 Encounters:  01/05/20 206 lb 9.6 oz (93.7 kg)  10/05/19 197 lb 3.2 oz (89.4 kg)  08/06/19 196 lb 8 oz (89.1 kg)    Physical Exam Vitals and nursing note reviewed.  Constitutional:      General: She is awake. She  is not  in acute distress.    Appearance: She is well-developed and well-groomed. She is morbidly obese. She is not ill-appearing.  HENT:     Head: Normocephalic.     Right Ear: Hearing normal.     Left Ear: Hearing normal.  Eyes:     General: Lids are normal.        Right eye: No discharge.        Left eye: No discharge.     Conjunctiva/sclera: Conjunctivae normal.     Pupils: Pupils are equal, round, and reactive to light.  Neck:     Thyroid: No thyromegaly.     Vascular: No carotid bruit.  Cardiovascular:     Rate and Rhythm: Normal rate and regular rhythm.     Heart sounds: Murmur heard.  Systolic murmur is present with a grade of 2/6.  No gallop.   Pulmonary:     Effort: Pulmonary effort is normal. No accessory muscle usage or respiratory distress.     Breath sounds: Normal breath sounds.  Abdominal:     General: Bowel sounds are normal.     Palpations: Abdomen is soft.  Musculoskeletal:     Cervical back: Normal range of motion and neck supple.     Right lower leg: 1+ Pitting Edema present.     Left lower leg: 1+ Pitting Edema present.  Skin:    General: Skin is warm and dry.  Neurological:     Mental Status: She is alert and oriented to person, place, and time.  Psychiatric:        Attention and Perception: Attention normal.        Mood and Affect: Mood normal.        Behavior: Behavior normal. Behavior is cooperative.        Thought Content: Thought content normal.        Judgment: Judgment normal.     Results for orders placed or performed in visit on 10/05/19  Bayer DCA Hb A1c Waived  Result Value Ref Range   HB A1C (BAYER DCA - WAIVED) 6.6 <7.8 %  Basic metabolic panel  Result Value Ref Range   Glucose 115 (H) 65 - 99 mg/dL   BUN 67 (H) 8 - 27 mg/dL   Creatinine, Ser 3.07 (H) 0.57 - 1.00 mg/dL   GFR calc non Af Amer 15 (L) >59 mL/min/1.73   GFR calc Af Amer 17 (L) >59 mL/min/1.73   BUN/Creatinine Ratio 22 12 - 28   Sodium 136 134 - 144 mmol/L    Potassium 4.9 3.5 - 5.2 mmol/L   Chloride 97 96 - 106 mmol/L   CO2 26 20 - 29 mmol/L   Calcium 9.5 8.7 - 10.3 mg/dL  Lipid Panel w/o Chol/HDL Ratio  Result Value Ref Range   Cholesterol, Total 116 100 - 199 mg/dL   Triglycerides 55 0 - 149 mg/dL   HDL 62 >39 mg/dL   VLDL Cholesterol Cal 13 5 - 40 mg/dL   LDL Chol Calc (NIH) 41 0 - 99 mg/dL  Vitamin B12  Result Value Ref Range   Vitamin B-12 417 232 - 1,245 pg/mL      Assessment & Plan:   Problem List Items Addressed This Visit      Cardiovascular and Mediastinum   Chronic diastolic heart failure (HCC)    Chronic, ongoing.  Continue collaboration with cardiology and current medication regimen.  Recommend: - Reminded to call for an overnight weight gain of >2 pounds or a weekly weight  weight of >5 pounds - not adding salt to his food and has been reading food labels. 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  Continue current medication regimen as prescribed by cardiology and collaboration with cardiology and nephrology.  BMP up to date.  Recommend she continue to monitor BP regularly at home and document + bring to visits.  DASH diet focus.  Return in 3 months.      Atrial fibrillation (HCC)    Chronic, ongoing with rate controlled at this time.  Continue collaboration with cardiology and medication regimen as prescribed by Dr. Chancy Milroy.  Continue Xarelto and Metoprolol.          Endocrine   Hyperlipidemia associated with type 2 diabetes mellitus (HCC)    Chronic, ongoing.  Continue current medication regimen and adjust as needed.  Lipid panel next visit.      Type 2 diabetes mellitus with stage 4 chronic kidney disease, without long-term current use of insulin (HCC) - Primary    Chronic, with A1C at 5.8%, still below goal.  Continue current medication  regimen, Januvia and monitoring BS at home regularly. May benefit from change to [facility] for HF, dependent on kidney function at next visit.  Recommend focus on diabetic diet at home.  Return in 3 months.        Relevant Orders   Bayer DCA Hb A1c Waived     Genitourinary   Chronic kidney disease, stage 4 (severe) (HCC)    Chronic, followed by nephrology at this time.  Urine micro alb 10 and A:C 30-300 last visit.  Continue Olmesartan for kidney protection and monitor medications for renal dosing.  BMP up to date.        Hematopoietic and Hemostatic   Acquired thrombophilia ([place])    Secondary to Xarelto use.  Continue to monitor for bleeding and wounds.  Gentle skin care at home.  Followed by hematology for anemia.        Other   Morbid obesity (HCC)    BMI >35 with T2DM.  Recommended eating smaller high protein, low fat meals more frequently and exercising 30 mins a day 5 times a week with a goal of 10-15lb weight loss in the next 3 months. Patient voiced their understanding and motivation to adhere to these recommendations.       Anemia due to stage 4 chronic kidney disease (HCC)    Chronic, ongoing.  Followed by hematology and receiving iron transfusions.  Continue this collaboration, recent labs and note reviewed.      BMI 38.0-38.9,adult    Refer to morbid obesity plan for further.       Other Visit Diagnoses    Need for influenza vaccination       Flu vaccine today   Relevant Orders   Flu Vaccine QUAD High Dose(Fluad) (Completed)       Follow up plan: Return in about 3 months (around 04/06/2020) for T2DM, HTN/HLD, Hypothyroid, CKD.

## 2020-01-05 NOTE — Patient Instructions (Signed)

## 2020-01-31 ENCOUNTER — Other Ambulatory Visit: Payer: Self-pay | Admitting: Nurse Practitioner

## 2020-02-02 ENCOUNTER — Inpatient Hospital Stay: Payer: PRIVATE HEALTH INSURANCE | Attending: Internal Medicine

## 2020-02-02 ENCOUNTER — Other Ambulatory Visit: Payer: Self-pay

## 2020-02-02 DIAGNOSIS — Z833 Family history of diabetes mellitus: Secondary | ICD-10-CM | POA: Diagnosis not present

## 2020-02-02 DIAGNOSIS — Z8616 Personal history of COVID-19: Secondary | ICD-10-CM | POA: Diagnosis not present

## 2020-02-02 DIAGNOSIS — D509 Iron deficiency anemia, unspecified: Secondary | ICD-10-CM | POA: Diagnosis present

## 2020-02-02 DIAGNOSIS — Z79899 Other long term (current) drug therapy: Secondary | ICD-10-CM | POA: Insufficient documentation

## 2020-02-02 DIAGNOSIS — Z7901 Long term (current) use of anticoagulants: Secondary | ICD-10-CM | POA: Insufficient documentation

## 2020-02-02 DIAGNOSIS — N184 Chronic kidney disease, stage 4 (severe): Secondary | ICD-10-CM | POA: Diagnosis not present

## 2020-02-02 DIAGNOSIS — M8580 Other specified disorders of bone density and structure, unspecified site: Secondary | ICD-10-CM | POA: Diagnosis not present

## 2020-02-02 DIAGNOSIS — E039 Hypothyroidism, unspecified: Secondary | ICD-10-CM | POA: Diagnosis not present

## 2020-02-02 DIAGNOSIS — E119 Type 2 diabetes mellitus without complications: Secondary | ICD-10-CM | POA: Diagnosis not present

## 2020-02-02 DIAGNOSIS — E7800 Pure hypercholesterolemia, unspecified: Secondary | ICD-10-CM | POA: Diagnosis not present

## 2020-02-02 DIAGNOSIS — I110 Hypertensive heart disease with heart failure: Secondary | ICD-10-CM | POA: Insufficient documentation

## 2020-02-02 DIAGNOSIS — Z8349 Family history of other endocrine, nutritional and metabolic diseases: Secondary | ICD-10-CM | POA: Diagnosis not present

## 2020-02-02 DIAGNOSIS — Z8249 Family history of ischemic heart disease and other diseases of the circulatory system: Secondary | ICD-10-CM | POA: Insufficient documentation

## 2020-02-02 DIAGNOSIS — D631 Anemia in chronic kidney disease: Secondary | ICD-10-CM | POA: Diagnosis not present

## 2020-02-02 DIAGNOSIS — I509 Heart failure, unspecified: Secondary | ICD-10-CM | POA: Diagnosis not present

## 2020-02-02 DIAGNOSIS — N183 Chronic kidney disease, stage 3 unspecified: Secondary | ICD-10-CM

## 2020-02-02 DIAGNOSIS — Z7984 Long term (current) use of oral hypoglycemic drugs: Secondary | ICD-10-CM | POA: Diagnosis not present

## 2020-02-02 LAB — CBC WITH DIFFERENTIAL/PLATELET
Abs Immature Granulocytes: 0.02 10*3/uL (ref 0.00–0.07)
Basophils Absolute: 0 10*3/uL (ref 0.0–0.1)
Basophils Relative: 0 %
Eosinophils Absolute: 0.1 10*3/uL (ref 0.0–0.5)
Eosinophils Relative: 2 %
HCT: 36.5 % (ref 36.0–46.0)
Hemoglobin: 11.9 g/dL — ABNORMAL LOW (ref 12.0–15.0)
Immature Granulocytes: 0 %
Lymphocytes Relative: 22 %
Lymphs Abs: 1.3 10*3/uL (ref 0.7–4.0)
MCH: 32.6 pg (ref 26.0–34.0)
MCHC: 32.6 g/dL (ref 30.0–36.0)
MCV: 100 fL (ref 80.0–100.0)
Monocytes Absolute: 0.7 10*3/uL (ref 0.1–1.0)
Monocytes Relative: 12 %
Neutro Abs: 3.5 10*3/uL (ref 1.7–7.7)
Neutrophils Relative %: 64 %
Platelets: 146 10*3/uL — ABNORMAL LOW (ref 150–400)
RBC: 3.65 MIL/uL — ABNORMAL LOW (ref 3.87–5.11)
RDW: 12.6 % (ref 11.5–15.5)
WBC: 5.6 10*3/uL (ref 4.0–10.5)
nRBC: 0 % (ref 0.0–0.2)

## 2020-02-02 LAB — FERRITIN: Ferritin: 130 ng/mL (ref 11–307)

## 2020-02-02 LAB — BASIC METABOLIC PANEL
Anion gap: 10 (ref 5–15)
BUN: 18 mg/dL (ref 8–23)
CO2: 30 mmol/L (ref 22–32)
Calcium: 9 mg/dL (ref 8.9–10.3)
Chloride: 99 mmol/L (ref 98–111)
Creatinine, Ser: 1.48 mg/dL — ABNORMAL HIGH (ref 0.44–1.00)
GFR, Estimated: 38 mL/min — ABNORMAL LOW (ref >60)
Glucose, Bld: 120 mg/dL — ABNORMAL HIGH (ref 70–99)
Potassium: 4.5 mmol/L (ref 3.5–5.1)
Sodium: 139 mmol/L (ref 135–145)

## 2020-02-02 LAB — IRON AND TIBC
Iron: 77 ug/dL (ref 28–170)
Saturation Ratios: 18 % (ref 10.4–31.8)
TIBC: 423 ug/dL (ref 250–450)
UIBC: 346 ug/dL

## 2020-02-02 LAB — VITAMIN B12: Vitamin B-12: 619 pg/mL (ref 180–914)

## 2020-02-03 ENCOUNTER — Other Ambulatory Visit: Payer: Self-pay

## 2020-02-03 DIAGNOSIS — N183 Chronic kidney disease, stage 3 unspecified: Secondary | ICD-10-CM

## 2020-02-03 DIAGNOSIS — D500 Iron deficiency anemia secondary to blood loss (chronic): Secondary | ICD-10-CM

## 2020-02-04 ENCOUNTER — Inpatient Hospital Stay: Payer: PRIVATE HEALTH INSURANCE

## 2020-02-04 ENCOUNTER — Inpatient Hospital Stay (HOSPITAL_BASED_OUTPATIENT_CLINIC_OR_DEPARTMENT_OTHER): Payer: PRIVATE HEALTH INSURANCE | Admitting: Internal Medicine

## 2020-02-04 ENCOUNTER — Other Ambulatory Visit: Payer: Self-pay

## 2020-02-04 VITALS — BP 154/60 | HR 52 | Temp 96.9°F | Resp 18 | Ht 62.0 in | Wt 208.0 lb

## 2020-02-04 DIAGNOSIS — D631 Anemia in chronic kidney disease: Secondary | ICD-10-CM

## 2020-02-04 DIAGNOSIS — D509 Iron deficiency anemia, unspecified: Secondary | ICD-10-CM | POA: Diagnosis not present

## 2020-02-04 DIAGNOSIS — D510 Vitamin B12 deficiency anemia due to intrinsic factor deficiency: Secondary | ICD-10-CM | POA: Diagnosis not present

## 2020-02-04 DIAGNOSIS — N184 Chronic kidney disease, stage 4 (severe): Secondary | ICD-10-CM | POA: Diagnosis not present

## 2020-02-04 DIAGNOSIS — D500 Iron deficiency anemia secondary to blood loss (chronic): Secondary | ICD-10-CM

## 2020-02-04 MED ORDER — SODIUM CHLORIDE 0.9 % IV SOLN
510.0000 mg | Freq: Once | INTRAVENOUS | Status: AC
  Administered 2020-02-04: 14:00:00 510 mg via INTRAVENOUS
  Filled 2020-02-04: qty 510

## 2020-02-04 MED ORDER — SODIUM CHLORIDE 0.9 % IV SOLN
Freq: Once | INTRAVENOUS | Status: AC
  Filled 2020-02-04: qty 250

## 2020-02-04 NOTE — Progress Notes (Signed)
[facility] NOTE  Patient Care Team: Venita Lick, NP as PCP - General (Nurse Practitioner) Dionisio David, MD as Consulting Physician (Cardiology) Cammie Sickle, MD as Consulting Physician (Internal Medicine)  CHIEF COMPLAINTS/PURPOSE OF CONSULTATION:   # 2014- Iron def Anemia-  [Sep 2014 EGD/COLO- NEG/ferritin 6; Dr.Wohl]. 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   # Hx CHF/CAD- asprin-plavix; Sep 2018- s/p hiatal hernia-fundoplication [Dr.Pabon]  HISTORY OF PRESENTING ILLNESS:  Erica Larsen 69 y.o.  female patient with above history of iron deficiency anemia-secondary to CKD CHF is here status post IV iron in November 2016 is here for follow-up.   Patient interim has been followed by cardiology.  She also been followed by nephrology.  Denies any worsening shortness of breath or cough.  No nausea no vomiting.  No abdominal pain.  No blood in stools or black or stools.  .Review of Systems  Constitutional: Positive for malaise/fatigue. Negative for chills, diaphoresis, fever and weight loss.  HENT: Negative for nosebleeds and sore throat.   Eyes: Negative for double vision.  Respiratory: Negative for cough, hemoptysis, sputum production, shortness of breath and wheezing.   Cardiovascular: Negative for chest pain, palpitations, orthopnea and leg swelling.  Gastrointestinal: Negative for abdominal pain, blood in stool, constipation, diarrhea, heartburn, melena, nausea and vomiting.  Genitourinary: Negative for dysuria, frequency and urgency.  Musculoskeletal: Positive for back pain and joint pain.  Skin: Negative.  Negative for itching and rash.  Neurological: Negative for dizziness, tingling, focal weakness, weakness and headaches.  Endo/Heme/Allergies: Does not bruise/bleed easily.  Psychiatric/Behavioral: Negative for depression.  The patient is not nervous/anxious and does not have insomnia.      MEDICAL HISTORY:  Past Medical History:  Diagnosis Date  . Anemia 2017  . Arthritis    osteoarthritis  . CHF (congestive heart failure) ([place])   . Chronic kidney disease 2017   stage 3  . Chronic kidney disease    stage 4   . Coronary artery disease   . COVID-19 virus detected 08/14/2018  . Diabetes mellitus without complication ([place])   . Diverticulosis   . Esophagitis   . GERD (gastroesophageal reflux disease)   . Hiatal hernia   . High cholesterol   . Hypercalcemia   . Hypertension   . Hypothyroidism   . Lumbar radiculopathy   . Obesity   . Osteopenia    Hips  . Thyroid disease     SURGICAL HISTORY: Past Surgical History:  Procedure Laterality Date  . CATARACT EXTRACTION Bilateral    May - June 2018  . COLONOSCOPY WITH PROPOFOL N/A 08/01/2015   Procedure: COLONOSCOPY WITH PROPOFOL;  Surgeon: Lucilla Lame, MD;  Location: [facility] ENDOSCOPY;  Service: Endoscopy;  Laterality: N/A;  . CORONARY ANGIOPLASTY WITH STENT PLACEMENT    . ESOPHAGOGASTRODUODENOSCOPY (EGD) WITH PROPOFOL N/A 05/14/2016   Procedure: ESOPHAGOGASTRODUODENOSCOPY (EGD) WITH PROPOFOL;  Surgeon: Lucilla Lame, MD;  Location: [facility] ENDOSCOPY;  Service: Endoscopy;  Laterality: N/A;  . GIVENS CAPSULE STUDY N/A 06/01/2015   Procedure: GIVENS CAPSULE STUDY;  Surgeon: Lucilla Lame, MD;  Location: [facility] ENDOSCOPY;  Service: Endoscopy;  Laterality: N/A;  . HIATAL HERNIA REPAIR N/A 09/24/2016   Procedure: LAPAROSCOPIC REPAIR OF HIATAL HERNIA WITH FUNDOPLICATION WITH MESH (BIO-A);  Surgeon: Jules Husbands, MD;  Location: [facility] ORS;  Service: General;  Laterality: N/A;  Lithotomy position w arms tucked.   . pre cancer removed  left forearm  . squamous cell carcinoma surgery      SOCIAL HISTORY: Social History   Socioeconomic History  . Marital status: Married    Spouse name: Not on file  . Number of children: Not on file  . Years of education: Not on  file  . Highest education level: Not on file  Occupational History  . Not on file  Tobacco Use  . Smoking status: Never Smoker  . Smokeless tobacco: Never Used  Vaping Use  . Vaping Use: Never used  Substance and Sexual Activity  . Alcohol use: No  . Drug use: No  . Sexual activity: Yes    Birth control/protection: Post-menopausal  Other Topics Concern  . Not on file  Social History Narrative  . Not on file   Social Determinants of Health   Financial Resource Strain: Low Risk   . Difficulty of Paying Living Expenses: Not hard at all  Food Insecurity: No Food Insecurity  . Worried About Charity fundraiser in the Last Year: Never true  . Ran Out of Food in the Last Year: Never true  Transportation Needs: No Transportation Needs  . Lack of Transportation (Medical): No  . Lack of Transportation (Non-Medical): No  Physical Activity: Not on file  Stress: Not on file  Social Connections: Socially Isolated  . Frequency of Communication with Friends and Family: More than three times a week  . Frequency of Social Gatherings with Friends and Family: More than three times a week  . Attends Religious Services: Never  . Active Member of Clubs or Organizations: No  . Attends Archivist Meetings: Never  . Marital Status: Never married  Human resources officer Violence: Not on file    FAMILY HISTORY: Family History  Problem Relation Age of Onset  . Heart disease Mother   . Kidney failure Mother   . Hypertension Mother   . Diabetes Mother   . Heart attack Father   . Hypertension Father   . Thyroid disease Father   . Hypertension Sister   . Diabetes Sister   . Thyroid disease Daughter   . Thyroid disease Daughter     ALLERGIES:  is allergic to sulfa antibiotics and latex.  MEDICATIONS:  Current Outpatient Medications  Medication Sig Dispense Refill  . amiodarone (PACERONE) 200 MG tablet Take 200 mg by mouth daily.    [name] atorvastatin (LIPITOR) 80 MG tablet Take 80 mg by  mouth daily after breakfast.   0  . Ferrous Sulfate (SLOW FE PO) Take 1 tablet by mouth daily.    . furosemide (LASIX) 40 MG tablet Take 40 mg by mouth daily.    [name] JANUVIA 25 MG tablet TAKE 1 TABLET(25 MG) BY MOUTH DAILY 90 tablet 0  . lansoprazole (PREVACID) 30 MG capsule TAKE ONE CAPSULE BY MOUTH DAILY AT 12:00 NOON 90 capsule 3  . levothyroxine (SYNTHROID) 112 MCG tablet TAKE 1 TABLET(112 MCG) BY MOUTH DAILY 90 tablet 1  . metoprolol succinate (TOPROL-XL) 100 MG 24 hr tablet Take 1 tablet by mouth daily.    . Olmesartan-amLODIPine-HCTZ 40-5-12.5 MG TABS Take one tablet daily. 90 tablet   . Polyethyl Glycol-Propyl Glycol (SYSTANE ULTRA OP) Place 1 drop into both eyes 2 (two) times daily.     . raloxifene (EVISTA) 60 MG tablet TAKE 1 TABLET(60 MG) BY MOUTH DAILY 90 tablet 2  . triamcinolone cream (KENALOG) 0.1 % apply to affected area twice a day AVOID FACE  0  . XARELTO 20 MG  TABS tablet Take 1 tablet by mouth daily.     No current facility-administered medications for this visit.      [name]  PHYSICAL EXAMINATION: ECOG PERFORMANCE STATUS: 1 - Symptomatic but completely ambulatory  Vitals:   02/04/20 1300  BP: (!) 154/60  Pulse: (!) 52  Resp: 18  Temp: (!) 96.9 F (36.1 C)   Filed Weights   02/04/20 1300  Weight: 208 lb (94.3 kg)    Physical Exam HENT:     Head: Normocephalic and atraumatic.     Mouth/Throat:     Pharynx: No oropharyngeal exudate.  Eyes:     Pupils: Pupils are equal, round, and reactive to light.  Cardiovascular:     Rate and Rhythm: Normal rate and regular rhythm.  Pulmonary:     Effort: Pulmonary effort is normal. No respiratory distress.     Breath sounds: Normal breath sounds. No wheezing.  Abdominal:     General: Bowel sounds are normal. There is no distension.     Palpations: Abdomen is soft. There is no mass.     Tenderness: There is no abdominal tenderness. There is no guarding or rebound.  Musculoskeletal:        General: No tenderness.  Normal range of motion.     Cervical back: Normal range of motion and neck supple.  Skin:    General: Skin is warm.  Neurological:     Mental Status: She is alert and oriented to person, place, and time.  Psychiatric:        Mood and Affect: Affect normal.      LABORATORY DATA:  I have reviewed the data as listed Lab Results  Component Value Date   WBC 5.6 02/02/2020   HGB 11.9 (L) 02/02/2020   HCT 36.5 02/02/2020   MCV 100.0 02/02/2020   PLT 146 (L) 02/02/2020   Recent Labs    03/22/19 1030 08/04/19 1055 10/05/19 1101 02/02/20 1411  NA 139 139 136 139  K 4.5 4.2 4.9 4.5  CL 102 99 97 99  CO2 23 29 26 30   GLUCOSE 101* 134* 115* 120*  BUN 28* 43* 67* 18  CREATININE 1.73* 2.43* 3.07* 1.48*  CALCIUM 9.0 9.1 9.5 9.0  GFRNONAA 29* 19* 15* 38*  GFRAA 34* 22* 17*  --   PROT 6.8  --   --   --   ALBUMIN 4.4  --   --   --   AST 32  --   --   --   ALT 30  --   --   --   ALKPHOS 73  --   --   --   BILITOT 0.4  --   --   --      ASSESSMENT & PLAN:  Anemia due to stage 4 chronic kidney disease (HCC)  Iron deficiency anemia- unclear etiology; question GI blood loss/CKD [hx large hiatal hernia status post fundoplication].  Hemoglobin today is 11.9; iron sat- 18%; ferertin-130. Proceed with infusion today.   # B12 deficiency-low B12-June 2021; recommend sublingual B12  # CHF [Dr.Khan]/ CKD-IV sec to DM [Dr.Singh]  # DISPOSITION:   # IV Ferrahem today; NO B12  # follow up in 6 months/ MD/possible IV ferreham; B12 injection-   labs-/b12; cbc/bmp/iron/ ferritin few days prior- Dr.B  Cc; Dr.Khan/Singh    Cammie Sickle, MD 02/04/2020 1:56 PM

## 2020-02-04 NOTE — Progress Notes (Signed)
Stable at discharge 

## 2020-02-04 NOTE — Assessment & Plan Note (Addendum)
Iron deficiency anemia- unclear etiology; question GI blood loss/CKD [hx large hiatal hernia status post fundoplication].  Hemoglobin today is 11.9; iron sat- 18%; ferertin-130. Proceed with infusion today.   # B12 deficiency-low B12-June 2021; recommend sublingual B12  # CHF [Dr.Khan]/ CKD-IV sec to DM [Dr.Singh]  # DISPOSITION:   # IV Ferrahem today; NO B12  # follow up in 6 months/ MD/possible IV ferreham; B12 injection-   labs-/b12; cbc/bmp/iron/ ferritin few days prior- Dr.B  Cc; Dr.Khan/Singh

## 2020-04-03 ENCOUNTER — Other Ambulatory Visit: Payer: Self-pay | Admitting: Nurse Practitioner

## 2020-04-03 DIAGNOSIS — E039 Hypothyroidism, unspecified: Secondary | ICD-10-CM

## 2020-04-06 ENCOUNTER — Ambulatory Visit (INDEPENDENT_AMBULATORY_CARE_PROVIDER_SITE_OTHER): Payer: PRIVATE HEALTH INSURANCE | Admitting: Nurse Practitioner

## 2020-04-06 ENCOUNTER — Encounter: Payer: Self-pay | Admitting: Nurse Practitioner

## 2020-04-06 ENCOUNTER — Other Ambulatory Visit: Payer: Self-pay

## 2020-04-06 VITALS — BP 128/64 | HR 62 | Temp 97.8°F | Ht 62.0 in | Wt 204.0 lb

## 2020-04-06 DIAGNOSIS — N184 Chronic kidney disease, stage 4 (severe): Secondary | ICD-10-CM | POA: Diagnosis not present

## 2020-04-06 DIAGNOSIS — Z1231 Encounter for screening mammogram for malignant neoplasm of breast: Secondary | ICD-10-CM

## 2020-04-06 DIAGNOSIS — E1169 Type 2 diabetes mellitus with other specified complication: Secondary | ICD-10-CM

## 2020-04-06 DIAGNOSIS — E785 Hyperlipidemia, unspecified: Secondary | ICD-10-CM

## 2020-04-06 DIAGNOSIS — I480 Paroxysmal atrial fibrillation: Secondary | ICD-10-CM

## 2020-04-06 DIAGNOSIS — I130 Hypertensive heart and chronic kidney disease with heart failure and stage 1 through stage 4 chronic kidney disease, or unspecified chronic kidney disease: Secondary | ICD-10-CM | POA: Diagnosis not present

## 2020-04-06 DIAGNOSIS — D631 Anemia in chronic kidney disease: Secondary | ICD-10-CM

## 2020-04-06 DIAGNOSIS — E1122 Type 2 diabetes mellitus with diabetic chronic kidney disease: Secondary | ICD-10-CM | POA: Diagnosis not present

## 2020-04-06 DIAGNOSIS — M8580 Other specified disorders of bone density and structure, unspecified site: Secondary | ICD-10-CM | POA: Insufficient documentation

## 2020-04-06 DIAGNOSIS — Z6837 Body mass index (BMI) 37.0-37.9, adult: Secondary | ICD-10-CM

## 2020-04-06 DIAGNOSIS — M25552 Pain in left hip: Secondary | ICD-10-CM | POA: Diagnosis not present

## 2020-04-06 DIAGNOSIS — M85851 Other specified disorders of bone density and structure, right thigh: Secondary | ICD-10-CM

## 2020-04-06 DIAGNOSIS — I5032 Chronic diastolic (congestive) heart failure: Secondary | ICD-10-CM

## 2020-04-06 DIAGNOSIS — E039 Hypothyroidism, unspecified: Secondary | ICD-10-CM | POA: Diagnosis not present

## 2020-04-06 DIAGNOSIS — D6869 Other thrombophilia: Secondary | ICD-10-CM | POA: Diagnosis not present

## 2020-04-06 LAB — MICROALBUMIN, URINE WAIVED
Creatinine, Urine Waived: 50 mg/dL (ref 10–300)
Microalb, Ur Waived: 10 mg/L (ref 0–19)
Microalb/Creat Ratio: <30 mg/g (ref <30)

## 2020-04-06 LAB — BAYER DCA HB A1C WAIVED: HB A1C (BAYER DCA - WAIVED): 5.8 % (ref <7.0)

## 2020-04-06 MED ORDER — LEVOTHYROXINE SODIUM 112 MCG PO TABS: ORAL_TABLET | ORAL | 4 refills | Status: DC

## 2020-04-06 NOTE — Assessment & Plan Note (Signed)
BMI >35 with T2DM.  Recommended eating smaller high protein, low fat meals more frequently and exercising 30 mins a day 5 times a week with a goal of 10-15lb weight loss in the next 3 months. Patient voiced their understanding and motivation to adhere to these recommendations.

## 2020-04-06 NOTE — Assessment & Plan Note (Signed)
Chronic, followed by nephrology at this time.  Urine micro alb 10 and A:C <30 today.  Continue Olmesartan for kidney protection and monitor medications for renal dosing.  BMP up to date.  Avoid nephrotoxic medications.

## 2020-04-06 NOTE — Assessment & Plan Note (Signed)
Acute and improving.  Suspect some piriformis syndrome.  Recommend applying heat at home and gentle stretching.  May take Tylenol as needed, avoid Ibuprofen products.  Return for worsening or ongoing.

## 2020-04-06 NOTE — Assessment & Plan Note (Signed)
Chronic, ongoing.  Continue current medication regimen and adjust as needed. Lipid panel today. 

## 2020-04-06 NOTE — Assessment & Plan Note (Signed)
Chronic, ongoing.  Continue current Levothyroxine dose and adjust as needed based on lab results.  Thyroid check today.

## 2020-04-06 NOTE — Assessment & Plan Note (Signed)
Chronic, with BP at goal today on recheck and on home BP readings.  Continue current medication regimen as prescribed by cardiology and collaboration with cardiology and nephrology.  BMP up to date.  Recommend she continue to monitor BP regularly at home and document + bring to visits.  DASH diet focus.  Return in 3 months.

## 2020-04-06 NOTE — Assessment & Plan Note (Signed)
Secondary to Xarelto use.  Continue to monitor for bleeding and wounds.  Gentle skin care at home.  Followed by hematology for anemia.

## 2020-04-06 NOTE — Assessment & Plan Note (Signed)
Noted on DEXA 10/01/2012.  Plan to check Vitamin D level next visit.  Repeat DEXA ordered -- continue Raloxifene and monitor kidney function closely with use.

## 2020-04-06 NOTE — Assessment & Plan Note (Addendum)
Chronic, with A1C at 5.8%, still below goal, and urine ALB 10.  Continue current medication regimen, Januvia and monitoring BS at home regularly. May benefit from change to [facility] for HF, dependent on kidney function at next visit.  Recommend focus on diabetic diet at home.  Return in 3 months.

## 2020-04-06 NOTE — Patient Instructions (Signed)
[facility] at [facility]  Address: [address], [place], [place] [address]  Phone: [phone]   Bone Density Test A bone density test uses a type of X-ray to measure the amount of calcium and other minerals in a person's bones. It can measure bone density in the hip and the spine. The test is similar to having a regular X-ray. This test may also be called:  Bone densitometry.  Bone mineral density test.  Dual-energy X-ray absorptiometry (DEXA). You may have this test to:  Diagnose a condition that causes weak or thin bones (osteoporosis).  Screen you for osteoporosis.  Predict your risk for a broken bone (fracture).  Determine how well your osteoporosis treatment is working. Tell a health care provider about:  Any allergies you have.  All medicines you are taking, including vitamins, herbs, eye drops, creams, and over-the-counter medicines.  Any problems you or family members have had with anesthetic medicines.  Any blood disorders you have.  Any surgeries you have had.  Any medical conditions you have.  Whether you are pregnant or may be pregnant.  Any medical tests you have had within the past 14 days that used contrast material. What are the risks? Generally, this is a safe test. However, it does expose you to a small amount of radiation, which can slightly increase your cancer risk. What happens before the test?  Do not take any calcium supplements within the 24 hours before your test.  You will need to remove all metal jewelry, eyeglasses, removable dental appliances, and any other metal objects on your body. What happens during the test?  You will lie down on an exam table. There will be an X-ray generator below you and an imaging device above you.  Other devices, such as boxes or braces, may be used to position your body properly for the scan.  The machine will slowly scan your body. You will need to keep very still while the  machine does the scan.  The images will show up on a screen in the room. Images will be examined by a specialist after your test is finished. The procedure may vary among health care providers and hospitals.   What can I expect after the test? It is up to you to get the results of your test. Ask your health care provider, or the department that is doing the test, when your results will be ready. Summary  A bone density test is an imaging test that uses a type of X-ray to measure the amount of calcium and other minerals in your bones.  The test may be used to diagnose or screen you for a condition that causes weak or thin bones (osteoporosis), predict your risk for a broken bone (fracture), or determine how well your osteoporosis treatment is working.  Do not take any calcium supplements within 24 hours before your test.  Ask your health care provider, or the department that is doing the test, when your results will be ready. This information is not intended to replace advice given to you by your health care provider. Make sure you discuss any questions you have with your health care provider. Document Revised: 07/22/2019 Document Reviewed: 07/22/2019 Elsevier Patient Education  [place].

## 2020-04-06 NOTE — Assessment & Plan Note (Signed)
Chronic, ongoing with rate controlled at this time.  Continue collaboration with cardiology and medication regimen as prescribed by Dr. Chancy Milroy.  Continue Xarelto and Metoprolol.

## 2020-04-06 NOTE — Assessment & Plan Note (Signed)
Chronic, ongoing.  Followed by hematology and receiving iron transfusions.  Continue this collaboration, recent labs and note reviewed.

## 2020-04-06 NOTE — Assessment & Plan Note (Signed)
Chronic, ongoing.  Continue collaboration with cardiology and current medication regimen.  Recommend: - Reminded to call for an overnight weight gain of >2 pounds or a weekly weight weight of >5 pounds - not adding salt to his food and has been reading food labels. Reviewed the importance of keeping daily sodium intake to <2000mg daily  

## 2020-04-06 NOTE — Assessment & Plan Note (Signed)
Refer to morbid obesity plan of care. 

## 2020-04-06 NOTE — Progress Notes (Signed)
BP 128/64 (BP Location: Left Arm)   Pulse 62 Comment: apical  Temp 97.8 F (36.6 C) (Oral)   Ht 5\' 2"  (1.575 m)   Wt 204 lb (92.5 kg)   SpO2 97%   BMI 37.31 kg/m    Subjective:    Patient ID: Erica Larsen, female    DOB: Jul 16, 1950, 70 y.o.   MRN: [id number]  HPI: Erica Larsen is a 70 y.o. female  Chief Complaint  Patient presents with  . Diabetes  . Hypertension  . Hyperlipidemia  . Hip Pain   DIABETES Last A1C in November 5.8%. Continues on Januvia 25 MG (renal dosed).  Hypoglycemic episodes:no Polydipsia/polyuria:no Visual disturbance:no Chest pain:no Paresthesias:no Glucose Monitoring:rarely Accucheck frequency: rarely Fasting glucose: often under 130 Post prandial: Evening: Before meals: Taking Insulin?:no Long acting insulin: Short acting insulin: Blood Pressure Monitoring:daily Retinal Examination:Not up to Date Foot Exam:Up to Date Pneumovax:Up to Date Influenza:Up to Date Aspirin:no  ATRIAL FIBRILLATION Followed by cardiology, saw two weeks ago.  No changes made. Atrial fibrillation status: stable Satisfied with current treatment: yes  Medication side effects:  no Medication compliance: good compliance Etiology of atrial fibrillation: unknown Palpitations:  no Chest pain:  no Dyspnea on exertion:  occasionally, baseline Orthopnea:  no Syncope:  no Edema:  no Ventricular rate control: B-blocker Anti-coagulation: long acting  HYPERTENSION / HYPERLIPIDEMIA/CHF Continues on Atorvastatin for HLD. She is followed by Dr. Humphrey Rolls, cardiology, last saw 2 weeks ago.  Continues on Olmesartan-Amlodipine-HCTZ, Metoprolol, Lasix. Last Echo with EF 49% in May 2020. Satisfied with current treatment?yes Duration of hypertension:chronic BP monitoring frequency:rarely BP range:110-120/60-70 range BP medication side effects:no Duration of  hyperlipidemia:chronic Cholesterol medication side effects:no Cholesterol supplements: none Medication compliance:good compliance Aspirin:no Recent stressors:no Recurrent headaches:no Visual changes:no Palpitations:no Dyspnea:no Chest pain:no Lower extremity edema:no Dizzy/lightheaded:no  CHRONIC KIDNEY DISEASE Saw [facility] saw on 01/31/20 -- GFR 36, CRT 1.45, BUN 23 -- increase Benicar to 40 MG daily.  Has history of IV contract exposure in June 2018.  Is currently followed by hematology for chronic iron deficiency anemia with last visit on 02/04/20 -- had iron infusion that day. CKD status:stable Medications renally dose:yes Previous renal evaluation:yes Pneumovax:Up to Date Influenza Vaccine:Up to Date  HYPOTHYROIDISM Continues on Levothyroxine 112 MCG daily. Thyroid control status:stable Satisfied with current treatment? yes Medication side effects: no Medication compliance: good compliance Etiology of hypothyroidism:  Recent dose adjustment:no Fatigue: no Cold intolerance: no Heat intolerance: no Weight gain: no Weight loss: no Constipation: no Diarrhea/loose stools: no Palpitations: no Lower extremity edema: no Anxiety/depressed mood: no   OSTEOPENIA Last DEXA 10/01/2012.  Noted the BMD measured at Femur Neck Right is 0.857 g/cm2 with a T-score of  -1.3. Continues on Raloxifene. Satisfied with current treatment?: yes Medication side effects: no Medication compliance: good compliance Past osteoporosis medications/treatments:  Adequate calcium & vitamin D: yes Weight bearing exercises: yes   HIP PAIN Started about one week ago to left hip.  Reports it is improving. Duration: days Involved hip: left  Mechanism of injury: unknown Location: posterior Onset: gradual  Severity: 5/10  Quality: dull and aching Frequency: intermittent Radiation: yes Aggravating factors: prolonged sitting   Alleviating factors: nothing   Status: better Treatments attempted: none   Relief with NSAIDs?: No NSAIDs Taken Weakness with weight bearing: no Weakness with walking: no Paresthesias / decreased sensation: no Swelling: no Redness:no Fevers: no  Relevant past medical, surgical, family and social history reviewed and updated as indicated. Interim medical history since  our last visit reviewed. Allergies and medications reviewed and updated.  Review of Systems  Constitutional: Negative for activity change, appetite change, diaphoresis, fatigue and fever.  Respiratory: Negative for cough, chest tightness and shortness of breath.   Cardiovascular: Negative for chest pain, palpitations and leg swelling.  Gastrointestinal: Negative.   Endocrine: Negative for cold intolerance, heat intolerance, polydipsia, polyphagia and polyuria.  Neurological: Negative.   Psychiatric/Behavioral: Negative.     Per HPI unless specifically indicated above     Objective:    BP 128/64 (BP Location: Left Arm)   Pulse 62 Comment: apical  Temp 97.8 F (36.6 C) (Oral)   Ht 5\' 2"  (1.575 m)   Wt 204 lb (92.5 kg)   SpO2 97%   BMI 37.31 kg/m   Wt Readings from Last 3 Encounters:  04/06/20 204 lb (92.5 kg)  02/04/20 208 lb (94.3 kg)  01/05/20 206 lb 9.6 oz (93.7 kg)    Physical Exam Vitals and nursing note reviewed.  Constitutional:      General: She is awake. She is not in acute distress.    Appearance: She is well-developed and well-groomed. She is morbidly obese. She is not ill-appearing.  HENT:     Head: Normocephalic.     Right Ear: Hearing normal.     Left Ear: Hearing normal.  Eyes:     General: Lids are normal.        Right eye: No discharge.        Left eye: No discharge.     Conjunctiva/sclera: Conjunctivae normal.     Pupils: Pupils are equal, round, and reactive to light.  Neck:     Thyroid: No thyromegaly.     Vascular: No carotid bruit.  Cardiovascular:     Rate and Rhythm: Normal rate and regular rhythm.      Heart sounds: Murmur heard.   Systolic murmur is present with a grade of 2/6. No gallop.   Pulmonary:     Effort: Pulmonary effort is normal. No accessory muscle usage or respiratory distress.     Breath sounds: Normal breath sounds.  Abdominal:     General: Bowel sounds are normal.     Palpations: Abdomen is soft.  Musculoskeletal:     Cervical back: Normal range of motion and neck supple.     Right hip: Normal.     Left hip: No tenderness, bony tenderness or crepitus. Normal range of motion. Normal strength.     Right lower leg: No edema.     Left lower leg: No edema.  Skin:    General: Skin is warm and dry.  Neurological:     Mental Status: She is alert and oriented to person, place, and time.  Psychiatric:        Attention and Perception: Attention normal.        Mood and Affect: Mood normal.        Behavior: Behavior normal. Behavior is cooperative.        Thought Content: Thought content normal.        Judgment: Judgment normal.    Diabetic Foot Exam - Simple   Simple Foot Form Visual Inspection See comments: Yes Sensation Testing Intact to touch and monofilament testing bilaterally: Yes Pulse Check Posterior Tibialis and Dorsalis pulse intact bilaterally: Yes Comments Dry skin with thickened toenails.    Results for orders placed or performed in visit on 02/02/20  Iron and TIBC  Result Value Ref Range   Iron 77 28 - 170  ug/dL   TIBC 423 250 - 450 ug/dL   Saturation Ratios 18 10.4 - 31.8 %   UIBC 346 ug/dL  Ferritin  Result Value Ref Range   Ferritin 130 11 - 307 ng/mL  Vitamin B12  Result Value Ref Range   Vitamin B-12 619 180 - 914 pg/mL  Basic metabolic panel  Result Value Ref Range   Sodium 139 135 - 145 mmol/L   Potassium 4.5 3.5 - 5.1 mmol/L   Chloride 99 98 - 111 mmol/L   CO2 30 22 - 32 mmol/L   Glucose, Bld 120 (H) 70 - 99 mg/dL   BUN 18 8 - 23 mg/dL   Creatinine, Ser 1.48 (H) 0.44 - 1.00 mg/dL   Calcium 9.0 8.9 - 10.3 mg/dL   GFR,  Estimated 38 (L) >60 mL/min   Anion gap 10 5 - 15  CBC with Differential/Platelet  Result Value Ref Range   WBC 5.6 4.0 - 10.5 K/uL   RBC 3.65 (L) 3.87 - 5.11 MIL/uL   Hemoglobin 11.9 (L) 12.0 - 15.0 g/dL   HCT 36.5 36.0 - 46.0 %   MCV 100.0 80.0 - 100.0 fL   MCH 32.6 26.0 - 34.0 pg   MCHC 32.6 30.0 - 36.0 g/dL   RDW 12.6 11.5 - 15.5 %   Platelets 146 (L) 150 - 400 K/uL   nRBC 0.0 0.0 - 0.2 %   Neutrophils Relative % 64 %   Neutro Abs 3.5 1.7 - 7.7 K/uL   Lymphocytes Relative 22 %   Lymphs Abs 1.3 0.7 - 4.0 K/uL   Monocytes Relative 12 %   Monocytes Absolute 0.7 0.1 - 1.0 K/uL   Eosinophils Relative 2 %   Eosinophils Absolute 0.1 0.0 - 0.5 K/uL   Basophils Relative 0 %   Basophils Absolute 0.0 0.0 - 0.1 K/uL   Immature Granulocytes 0 %   Abs Immature Granulocytes 0.02 0.00 - 0.07 K/uL      Assessment & Plan:   Problem List Items Addressed This Visit      Cardiovascular and Mediastinum   Chronic diastolic heart failure (HCC)    Chronic, ongoing.  Continue collaboration with cardiology and current medication regimen.  Recommend: - Reminded to call for an overnight weight gain of >2 pounds or a weekly weight weight of >5 pounds - not adding salt to his food and has been reading food labels. 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  Continue current medication regimen as prescribed by cardiology and collaboration with cardiology and nephrology.  BMP up to date.  Recommend she continue to monitor BP regularly at home and document + bring to visits.  DASH diet focus.  Return in 3 months.      Atrial fibrillation (HCC)    Chronic, ongoing with rate controlled at this time.  Continue collaboration with cardiology and medication regimen as prescribed by Dr. Chancy Milroy.  Continue Xarelto and Metoprolol.           Endocrine   Hyperlipidemia associated with type 2 diabetes mellitus (HCC)    Chronic, ongoing.  Continue current medication regimen and adjust as needed.  Lipid panel today.      Relevant Orders   Bayer DCA Hb A1c Waived   Lipid Panel w/o Chol/HDL  Ratio   Type 2 diabetes mellitus with stage 4 chronic kidney disease, without long-term current use of insulin (HCC) - Primary    Chronic, with A1C at 5.8%, still below goal, and urine ALB 10.  Continue current medication regimen, Januvia and monitoring BS at home regularly. May benefit from change to [facility] for HF, dependent on kidney function at next visit.  Recommend focus on diabetic diet at home.  Return in 3 months.        Relevant Orders   Bayer DCA Hb A1c Waived   Microalbumin, Urine Waived   Hypothyroid    Chronic, ongoing.  Continue current Levothyroxine dose and adjust as needed based on lab results.  Thyroid check today.      Relevant Medications   levothyroxine (SYNTHROID) 112 MCG tablet   Other Relevant Orders   T4, free   TSH     Musculoskeletal and Integument   Osteopenia    Noted on DEXA 10/01/2012.  Plan to check Vitamin D level next visit.  Repeat DEXA ordered -- continue Raloxifene and monitor kidney function closely with use.      Relevant Orders   DG Bone Density     Genitourinary   Chronic kidney disease, stage 4 (severe) (HCC)    Chronic, followed by nephrology at this time.  Urine micro alb 10 and A:C <30 today.  Continue Olmesartan for kidney protection and monitor medications for renal dosing.  BMP up to date.  Avoid nephrotoxic medications.        Hematopoietic and Hemostatic   Acquired thrombophilia ([place])    Secondary to Xarelto use.  Continue to monitor for bleeding and wounds.  Gentle skin care at home.  Followed by hematology for anemia.        Other   Morbid obesity (HCC)    BMI >35 with T2DM.  Recommended eating smaller high protein, low fat meals more frequently and exercising 30 mins  a day 5 times a week with a goal of 10-15lb weight loss in the next 3 months. Patient voiced their understanding and motivation to adhere to these recommendations.       Anemia due to stage 4 chronic kidney disease (HCC)    Chronic, ongoing.  Followed by hematology and receiving iron transfusions.  Continue this collaboration, recent labs and note reviewed.      BMI 37.0-37.9, adult    Refer to morbid obesity plan of care.      Left hip pain    Acute and improving.  Suspect some piriformis syndrome.  Recommend applying heat at home and gentle stretching.  May take Tylenol as needed, avoid Ibuprofen products.  Return for worsening or ongoing.       Other Visit Diagnoses    Encounter for screening mammogram for malignant neoplasm of breast       Mammogram ordered   Relevant Orders   MM DIGITAL SCREENING BILATERAL       Follow up plan: Return in about 3 months (around 07/04/2020) for T2DM, HTN/HLD, OSTEOPENIA.

## 2020-04-07 LAB — LIPID PANEL W/O CHOL/HDL RATIO
Cholesterol, Total: 136 mg/dL (ref 100–199)
HDL: 78 mg/dL (ref >39)
LDL Chol Calc (NIH): 45 mg/dL (ref 0–99)
Triglycerides: 59 mg/dL (ref 0–149)
VLDL Cholesterol Cal: 13 mg/dL (ref 5–40)

## 2020-04-07 LAB — TSH: TSH: 2 u[IU]/mL (ref 0.450–4.500)

## 2020-04-07 LAB — T4, FREE: Free T4: 2.1 ng/dL — ABNORMAL HIGH (ref 0.82–1.77)

## 2020-04-07 NOTE — Progress Notes (Signed)
Please let Shanora know current cholesterol and thyroid labs are normal, continue all current medications.  Have a wonderful day!! Keep being awesome!!  Thank you for allowing me to participate in your care. Kindest regards, Jacquetta Polhamus

## 2020-04-20 ENCOUNTER — Other Ambulatory Visit: Payer: Self-pay

## 2020-04-20 ENCOUNTER — Encounter: Payer: Self-pay | Admitting: Emergency Medicine

## 2020-04-20 ENCOUNTER — Emergency Department
Admission: EM | Admit: 2020-04-20 | Discharge: 2020-04-20 | Disposition: A | Discharge status: Home / Self Care | Payer: PRIVATE HEALTH INSURANCE | Attending: Emergency Medicine | Admitting: Emergency Medicine

## 2020-04-20 DIAGNOSIS — A630 Anogenital (venereal) warts: Secondary | ICD-10-CM | POA: Diagnosis not present

## 2020-04-20 DIAGNOSIS — Z79899 Other long term (current) drug therapy: Secondary | ICD-10-CM | POA: Diagnosis not present

## 2020-04-20 DIAGNOSIS — K6289 Other specified diseases of anus and rectum: Secondary | ICD-10-CM | POA: Diagnosis present

## 2020-04-20 DIAGNOSIS — D631 Anemia in chronic kidney disease: Secondary | ICD-10-CM | POA: Diagnosis not present

## 2020-04-20 DIAGNOSIS — I130 Hypertensive heart and chronic kidney disease with heart failure and stage 1 through stage 4 chronic kidney disease, or unspecified chronic kidney disease: Secondary | ICD-10-CM | POA: Insufficient documentation

## 2020-04-20 DIAGNOSIS — E1122 Type 2 diabetes mellitus with diabetic chronic kidney disease: Secondary | ICD-10-CM | POA: Insufficient documentation

## 2020-04-20 DIAGNOSIS — Z951 Presence of aortocoronary bypass graft: Secondary | ICD-10-CM | POA: Insufficient documentation

## 2020-04-20 DIAGNOSIS — Z7984 Long term (current) use of oral hypoglycemic drugs: Secondary | ICD-10-CM | POA: Insufficient documentation

## 2020-04-20 DIAGNOSIS — Z8616 Personal history of COVID-19: Secondary | ICD-10-CM | POA: Diagnosis not present

## 2020-04-20 DIAGNOSIS — I5032 Chronic diastolic (congestive) heart failure: Secondary | ICD-10-CM | POA: Diagnosis not present

## 2020-04-20 DIAGNOSIS — N184 Chronic kidney disease, stage 4 (severe): Secondary | ICD-10-CM | POA: Insufficient documentation

## 2020-04-20 DIAGNOSIS — I4891 Unspecified atrial fibrillation: Secondary | ICD-10-CM | POA: Diagnosis not present

## 2020-04-20 DIAGNOSIS — Z91040 Latex allergy status: Secondary | ICD-10-CM | POA: Insufficient documentation

## 2020-04-20 DIAGNOSIS — Z85038 Personal history of other malignant neoplasm of large intestine: Secondary | ICD-10-CM | POA: Diagnosis not present

## 2020-04-20 DIAGNOSIS — E039 Hypothyroidism, unspecified: Secondary | ICD-10-CM | POA: Diagnosis not present

## 2020-04-20 DIAGNOSIS — Z7901 Long term (current) use of anticoagulants: Secondary | ICD-10-CM | POA: Diagnosis not present

## 2020-04-20 DIAGNOSIS — I2510 Atherosclerotic heart disease of native coronary artery without angina pectoris: Secondary | ICD-10-CM | POA: Insufficient documentation

## 2020-04-20 NOTE — ED Notes (Signed)
See triage note  Presents with "bumps" to buttocks around rectal area  States she did notice some bleeding

## 2020-04-20 NOTE — ED Provider Notes (Signed)
[facility] Emergency Department Provider Note ____________________________________________  Time seen: 8032  I have reviewed the triage vital signs and the nursing notes.  HISTORY  Chief Complaint  Rectal Pain   HPI Erica Larsen is a 70 y.o. female the below medical history, presents herself to the ED for evaluation of some "bumps ", that the patient noted last week.   She reports she thought she may have had hemorrhoids, noting that she had had some difficulty maintaining a clean bottom after bowel movements, but also some scant bright red blood toilet tissue when she wiped.  Patient describes when she applied some Preparation H cream, she felt of the bumps, and realized they were kind of rough and warty.  She denies any rectal pain, constipation, fecal incontinence, or any concern for STD exposure.  She presents for evaluation of her rectum since she cannot visualize it herself.  She reports a history of benign colon polyps removed during her 2017 colonoscopy.  Past Medical History:  Diagnosis Date  . Anemia 2017  . Arthritis    osteoarthritis  . CHF (congestive heart failure) ([place])   . Chronic kidney disease 2017   stage 3  . Chronic kidney disease    stage 4   . Coronary artery disease   . COVID-19 virus detected 08/14/2018  . Diabetes mellitus without complication ([place])   . Diverticulosis   . Esophagitis   . GERD (gastroesophageal reflux disease)   . Hiatal hernia   . High cholesterol   . Hypercalcemia   . Hypertension   . Hypothyroidism   . Lumbar radiculopathy   . Obesity   . Osteopenia    Hips  . Thyroid disease     Patient Active Problem List   Diagnosis Date Noted  . Osteopenia 04/06/2020  . BMI 37.0-37.9, adult 04/06/2020  . Left hip pain 04/06/2020  . B12 deficiency 10/05/2019  . Acquired thrombophilia ([place]) 03/22/2019  . Atrial fibrillation ([place]) 09/29/2018  . Anemia due to stage 4 chronic kidney disease ([place]) 08/06/2018  .  Morbid obesity ([place]) 04/08/2018  . Hypothyroid 03/25/2018  . Hiatal hernia 09/24/2016  . Advanced care planning/counseling discussion 05/31/2016  . Type 2 diabetes mellitus with stage 4 chronic kidney disease, without long-term current use of insulin ([place]) 12/19/2015  . Elevated AST (SGOT) 09/19/2015  . GERD with stricture 09/18/2015  . Benign neoplasm of descending colon   . Hyperlipidemia associated with type 2 diabetes mellitus ([place]) 03/08/2015  . Iron deficiency anemia due to chronic blood loss 12/12/2014  . CAD (coronary artery disease) 11/25/2014  . Chronic kidney disease, stage 4 (severe) ([place]) 11/25/2014  . Chronic diastolic heart failure ([place]) 08/17/2014  . 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 CATARACT EXTRACTION Bilateral    May - June 2018  . COLONOSCOPY WITH PROPOFOL N/A 08/01/2015   Procedure: COLONOSCOPY WITH PROPOFOL;  Surgeon: Lucilla Lame, MD;  Location: [facility] ENDOSCOPY;  Service: Endoscopy;  Laterality: N/A;  . CORONARY ANGIOPLASTY WITH STENT PLACEMENT    . ESOPHAGOGASTRODUODENOSCOPY (EGD) WITH PROPOFOL N/A 05/14/2016   Procedure: ESOPHAGOGASTRODUODENOSCOPY (EGD) WITH PROPOFOL;  Surgeon: Lucilla Lame, MD;  Location: [facility] ENDOSCOPY;  Service: Endoscopy;  Laterality: N/A;  . GIVENS CAPSULE STUDY N/A 06/01/2015   Procedure: GIVENS CAPSULE STUDY;  Surgeon: Lucilla Lame, MD;  Location: [facility] ENDOSCOPY;  Service: Endoscopy;  Laterality: N/A;  . HIATAL HERNIA REPAIR N/A  09/24/2016   Procedure: LAPAROSCOPIC REPAIR OF HIATAL HERNIA WITH FUNDOPLICATION WITH MESH (BIO-A);  Surgeon: Jules Husbands, MD;  Location: [facility] ORS;  Service: General;  Laterality: N/A;  Lithotomy position w arms tucked.   . pre cancer removed     left forearm  . squamous cell carcinoma surgery      Prior to Admission medications   Medication Sig Start Date End Date Taking? Authorizing Provider  amiodarone  (PACERONE) 200 MG tablet Take 200 mg by mouth daily. 02/10/19   [provider]  atorvastatin (LIPITOR) 80 MG tablet Take 80 mg by mouth daily after breakfast.  09/06/16   [provider]  Ferrous Sulfate (SLOW FE PO) Take 1 tablet by mouth daily.    [provider]  furosemide (LASIX) 40 MG tablet Take 40 mg by mouth daily. 07/13/19   [provider]  JANUVIA 25 MG tablet TAKE 1 TABLET(25 MG) BY MOUTH DAILY 01/31/20   Cannady, Henrine Screws T, NP  lansoprazole (PREVACID) 30 MG capsule TAKE ONE CAPSULE BY MOUTH DAILY AT 12:00 NOON 11/14/19   Cannady, Jolene T, NP  levothyroxine (SYNTHROID) 112 MCG tablet TAKE 1 TABLET(112 MCG) BY MOUTH DAILY 04/06/20   Cannady, Henrine Screws T, NP  metoprolol succinate (TOPROL-XL) 100 MG 24 hr tablet Take 1 tablet by mouth daily. 07/14/18   [provider]  Olmesartan-amLODIPine-HCTZ 40-5-12.5 MG TABS Take one tablet daily. 10/02/17   Trinna Post, PA-C  Polyethyl Glycol-Propyl Glycol (SYSTANE ULTRA OP) Place 1 drop into both eyes 2 (two) times daily.     [provider]  raloxifene (EVISTA) 60 MG tablet TAKE 1 TABLET(60 MG) BY MOUTH DAILY 09/26/19   Cannady, Henrine Screws T, NP  triamcinolone cream (KENALOG) 0.1 % apply to affected area twice a day AVOID FACE 02/06/17   [provider]  XARELTO 20 MG TABS tablet Take 1 tablet by mouth daily. 08/04/18   [provider]    Allergies Sulfa antibiotics and Latex  Family History  Problem Relation Age of Onset  . Heart disease Mother   . Kidney failure Mother   . Hypertension Mother   . Diabetes Mother   . Heart attack Father   . Hypertension Father   . Thyroid disease Father   . Hypertension Sister   . Diabetes Sister   . Thyroid disease Daughter   . Thyroid disease Daughter     Social History Social History   Tobacco Use  . Smoking status: Never Smoker  . Smokeless tobacco: Never Used  Vaping Use  . Vaping Use: Never used  Substance Use Topics  .  Alcohol use: No  . Drug use: No    Review of Systems  Constitutional: Negative for fever. Cardiovascular: Negative for chest pain. Respiratory: Negative for shortness of breath. Gastrointestinal: Negative for abdominal pain, vomiting and diarrhea.  Bright red blood per rectum as above. Genitourinary: Negative for dysuria. Musculoskeletal: Negative for back pain. Skin: Negative for rash. Neurological: Negative for headaches, focal weakness or numbness. ____________________________________________  PHYSICAL EXAM:  VITAL SIGNS: ED Triage Vitals [04/20/20 1435]  Enc Vitals Group     BP (!) 158/68     Pulse Rate (!) 57     Resp 17     Temp 98 F (36.7 C)     Temp Source Oral     SpO2 95 %     Weight 200 lb (90.7 kg)     Height 5\' 2"  (1.575 m)     Head Circumference  Peak Flow      Pain Score 4     Pain Loc      Pain Edu?      Excl. in [place]?     Constitutional: Alert and oriented. Well appearing and in no distress. Head: Normocephalic and atraumatic. Eyes: Conjunctivae are normal. Normal extraocular movements Cardiovascular: Normal rate, regular rhythm. Normal distal pulses. Respiratory: Normal respiratory effort. No wheezes/rales/rhonchi. Gastrointestinal: Soft and nontender. No distention. Rectal exam reveals several flesh-colored verrucous, lesions around the anus.  No indication of any external hemorrhoid on exam.  DRE is also negative for any internal hemorrhoids. Musculoskeletal: Nontender with normal range of motion in all extremities.  Neurologic:  Normal gait without ataxia. Normal speech and language. No gross focal neurologic deficits are appreciated. Skin:  Skin is warm, dry and intact. No rash noted. Psychiatric: Mood and affect are normal. Patient exhibits appropriate insight and judgment. ____________________________________________  PROCEDURES  Procedures ____________________________________________  INITIAL IMPRESSION / ASSESSMENT AND PLAN / ED  COURSE  DDX: hemorrhoids, rectal fissure, anal warts, rectal prolapse   Patient ED evaluation of rectal lesions, initially thought to be hemorrhoids.  Patient was evaluated for symptoms, and found to have verrucous appearing warty projections around the anus.  Patient's clinical picture is consistent with condylomata acuminata.  She is given information on the benign lesions, and will follow up with her gastroenterologist as she is due for her routine her primary provider for referral to dermatology or urology.  Return precautions have been discussed.  Steffanie Mingle Russey was evaluated in Emergency Department on 04/20/2020 for the symptoms described in the history of present illness. She was evaluated in the context of the global COVID-19 pandemic, which necessitated consideration that the patient might be at risk for infection with the SARS-CoV-2 virus that causes COVID-19. Institutional protocols and algorithms that pertain to the evaluation of patients at risk for COVID-19 are in a state of rapid change based on information released by regulatory bodies including the CDC and federal and state organizations. These policies and algorithms were followed during the patient's care in the ED. 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

## 2020-04-20 NOTE — Discharge Instructions (Signed)
Your exam revealed some small, benign growths (warts) around the anus. You may follow-up with your PCP or Dr. Allen Norris for evaluation for removal. Use stool softeners and disposable wipes when toileting.

## 2020-04-20 NOTE — ED Triage Notes (Signed)
Pt comes into the ED via POV c/o rectal pain with "bumps" on the outside.  Pt denies any known STD's and denies any confirmed hemorrhoids.  Pt states the "bumps" were bleeding and have discomfort to them.  Pt ambulatory to triage and in NAD>

## 2020-04-21 ENCOUNTER — Other Ambulatory Visit: Payer: Self-pay | Admitting: Nurse Practitioner

## 2020-04-21 DIAGNOSIS — Z1231 Encounter for screening mammogram for malignant neoplasm of breast: Secondary | ICD-10-CM

## 2020-05-04 ENCOUNTER — Other Ambulatory Visit: Payer: Self-pay | Admitting: Nurse Practitioner

## 2020-05-15 ENCOUNTER — Ambulatory Visit
Admission: RE | Admit: 2020-05-15 | Discharge: 2020-05-15 | Disposition: A | Discharge status: Home / Self Care | Payer: PRIVATE HEALTH INSURANCE | Source: Ambulatory Visit | Attending: Nurse Practitioner | Admitting: Nurse Practitioner

## 2020-05-15 ENCOUNTER — Other Ambulatory Visit: Payer: Self-pay

## 2020-05-15 DIAGNOSIS — M85851 Other specified disorders of bone density and structure, right thigh: Secondary | ICD-10-CM

## 2020-05-15 DIAGNOSIS — Z1231 Encounter for screening mammogram for malignant neoplasm of breast: Secondary | ICD-10-CM

## 2020-05-15 NOTE — Progress Notes (Signed)
Good afternoon, please let Erica Larsen know: Your bone density shows thinning bones (osteopenia) but not brittle (osteoporosis). We recommend Vitamin D supplementation of about 2,0000 IUs of over the counter Vitamin D3. In addition, we recommend a diet high in calcium with dairy and dark green leafy vegetables. We would like you to get plenty of weight bearing exercises with walking and resistance training such as light weights or resistance bands available with instructions at places such as [employer].  Any questions?  Will recheck scan in 5 years.

## 2020-05-24 ENCOUNTER — Other Ambulatory Visit (HOSPITAL_COMMUNITY)
Admission: RE | Admit: 2020-05-24 | Discharge: 2020-05-24 | Disposition: A | Discharge status: Home / Self Care | Payer: PRIVATE HEALTH INSURANCE | Source: Ambulatory Visit | Attending: Nurse Practitioner | Admitting: Nurse Practitioner

## 2020-05-24 ENCOUNTER — Other Ambulatory Visit: Payer: Self-pay

## 2020-05-24 ENCOUNTER — Encounter: Payer: Self-pay | Admitting: Nurse Practitioner

## 2020-05-24 ENCOUNTER — Ambulatory Visit (INDEPENDENT_AMBULATORY_CARE_PROVIDER_SITE_OTHER): Payer: PRIVATE HEALTH INSURANCE | Admitting: Nurse Practitioner

## 2020-05-24 VITALS — BP 149/76 | HR 52 | Temp 98.2°F | Wt 207.2 lb

## 2020-05-24 DIAGNOSIS — Z124 Encounter for screening for malignant neoplasm of cervix: Secondary | ICD-10-CM

## 2020-05-24 DIAGNOSIS — Z1151 Encounter for screening for human papillomavirus (HPV): Secondary | ICD-10-CM | POA: Insufficient documentation

## 2020-05-24 DIAGNOSIS — K7460 Unspecified cirrhosis of liver: Secondary | ICD-10-CM | POA: Insufficient documentation

## 2020-05-24 DIAGNOSIS — A630 Anogenital (venereal) warts: Secondary | ICD-10-CM | POA: Diagnosis not present

## 2020-05-24 DIAGNOSIS — K7290 Hepatic failure, unspecified without coma: Secondary | ICD-10-CM | POA: Insufficient documentation

## 2020-05-24 MED ORDER — IMIQUIMOD PUMP 3.75 % EX CREA: TOPICAL_CREAM | CUTANEOUS | 1 refills | Status: DC

## 2020-05-24 NOTE — Patient Instructions (Signed)
Genital Warts  Genital warts are a common STI (sexually transmitted infection). They are small warts in the area around the genitals or the opening of the butt (anus). They sometimes cause pain. Genital warts are easily passed to other people through sex. Many people do not know that they are infected. They may be infected for years without symptoms. Even without symptoms, they can pass the infection to their sex partners. What are the causes? Genital warts are caused by a virus called HPV. HPV spreads from person to person during sex. It can be spread through vaginal, anal, and oral sex. What increases the risk?  Having sex without using a condom.  Having sex with many people.  Having sex before the age of 69.  Being a female who is not circumcised.  Having a female sex partner who is not circumcised.  Having a weak body defense system (immune system). What are the signs or symptoms?  Small warts in the area around the genitals or the opening of the butt. These warts often grow in clusters.  Itching and irritation in the genital area or the area around the opening of the butt.  Bleeding from the warts.  Pain during sex. How is this treated?  Medicines, such as creams, that are put on the skin.  Procedures, such as: ? Freezing the warts. ? Burning the warts. ? Having surgery to get rid of the warts. Getting treatment is important because genital warts can lead to other problems. In females, the virus that causes the warts may increase the risk for cancer of the cervix. Follow these instructions at home: Medicines  Apply over-the-counter and prescription medicines only as told by your doctor.  Do not use medicines that are meant for treating hand warts.  Talk with your doctor about using creams to treat itching.   Instructions for women  Get regular tests to check for cancer of the cervix. This type of cancer grows slowly and can almost always be cured if it is found  early.  If you become pregnant, tell your doctor that you have had genital warts. General instructions  Do not touch or scratch the warts.  Do not have sex until your treatment is done.  Tell your current and past sex partners about your condition. They may need treatment.  After treatment, use condoms during sex.  Keep all follow-up visits as told by your doctor. This is important. How is this prevented? Talk with your doctor about getting the HPV shot. The HPV shot:  Can help stop some HPV infections and cancers.  Is given to males and females who are 61-84 years old.  Is not recommended for pregnant women.  Will not work if you already have HPV. Contact a doctor if:  You have redness, swelling, or pain in the area of the treated skin.  You have a fever.  You feel sick.  You have lumps or have bleeding in the area around your genitals or the opening of your butt.  You have pain during sex.  You have bleeding after sex. Summary  Genital warts are a common STI. They are small warts in the area around the genitals or the opening of the butt (anus).  A virus called HPV causes genital warts.  The virus is spread by having sex without using a condom.  Getting treatment is important.  This condition is treated using medicines. Freezing, burning, or surgery may be done to get rid of the warts. This information  is not intended to replace advice given to you by your health care provider. Make sure you discuss any questions you have with your health care provider. Document Revised: 12/17/2018 Document Reviewed: 12/17/2018 Elsevier Patient Education  [place].

## 2020-05-24 NOTE — Progress Notes (Signed)
BP (!) 149/76   Pulse (!) 52   Temp 98.2 F (36.8 C) (Oral)   Wt 207 lb 3.2 oz (94 kg)   SpO2 95%   BMI 37.90 kg/m    Subjective:    Patient ID: Erica Larsen, female    DOB: 04/04/1950, 70 y.o.   MRN: [id number]  HPI: [name] is a 70 y.o. female  Chief Complaint  Patient presents with  . Rectal Problems    Patient she has noticed some bumps around her rectum and notice they bleed when patient wipes.   . Pap   Presents today as wishes to continue pap exams, made her aware of guideline recommendations and she reports wishes to continue.  Is aware insurance may not cover testing.  RECTAL ISSUES Last colonoscopy 08/01/2015.  History of internal non bleeding hemorrhoids.  She just noticed areas around rectum one month ago, notices these bleed when she wipes.  Sometimes this burns.  Has a BM every day -- occasionally had to strain in past, but not recently. Duration: days Anal fullness: no Perianal itching/irritation: no Perianal pain: no Bright red rectal bleeding: no Amount of blood: spotting Frequency: intermittent Constipation: no Hard stools: no Chronic straining/valsava: no Alleviating factors: nothing Aggravating factors: having BM Status: fluctuating Treatments attempted: nothing  Previous hemorrhoids: no  Colonoscopy: yes  Relevant past medical, surgical, family and social history reviewed and updated as indicated. Interim medical history since our last visit reviewed. Allergies and medications reviewed and updated.  Review of Systems  Constitutional: Negative for activity change, appetite change, diaphoresis, fatigue and fever.  Respiratory: Negative for cough, chest tightness and shortness of breath.   Cardiovascular: Negative for chest pain, palpitations and leg swelling.  Gastrointestinal: Positive for anal bleeding. Negative for abdominal distention, abdominal pain, constipation, diarrhea, nausea and vomiting.  Endocrine: Negative for cold  intolerance, heat intolerance, polydipsia, polyphagia and polyuria.  Neurological: Negative.   Psychiatric/Behavioral: Negative.     Per HPI unless specifically indicated above     Objective:    BP (!) 149/76   Pulse (!) 52   Temp 98.2 F (36.8 C) (Oral)   Wt 207 lb 3.2 oz (94 kg)   SpO2 95%   BMI 37.90 kg/m   Wt Readings from Last 3 Encounters:  05/24/20 207 lb 3.2 oz (94 kg)  04/20/20 200 lb (90.7 kg)  04/06/20 204 lb (92.5 kg)    Physical Exam Vitals and nursing note reviewed. Exam conducted with a chaperone present.  Constitutional:      General: She is awake. She is not in acute distress.    Appearance: She is well-developed and well-groomed. She is morbidly obese. She is not ill-appearing.  HENT:     Head: Normocephalic.     Right Ear: Hearing normal.     Left Ear: Hearing normal.  Eyes:     General: Lids are normal.        Right eye: No discharge.        Left eye: No discharge.     Conjunctiva/sclera: Conjunctivae normal.     Pupils: Pupils are equal, round, and reactive to light.  Neck:     Thyroid: No thyromegaly.     Vascular: No carotid bruit.  Cardiovascular:     Rate and Rhythm: Normal rate and regular rhythm.     Heart sounds: Murmur heard.   Systolic murmur is present with a grade of 2/6. No gallop.   Pulmonary:     Effort:  Pulmonary effort is normal. No accessory muscle usage or respiratory distress.     Breath sounds: Normal breath sounds.  Abdominal:     General: Bowel sounds are normal.     Palpations: Abdomen is soft.     Hernia: There is no hernia in the left inguinal area or right inguinal area.  Genitourinary:    Exam position: Lithotomy position.     Pubic Area: No rash.      Labia:        Right: No rash.        Left: No rash.      Vagina: Normal.     Cervix: Normal.     Uterus: Normal.      Adnexa: Right adnexa normal and left adnexa normal.     Comments: Around exterior rectal area clusters of small lesions with erythema to base  and pinkish tone.  No bleeding noted. Musculoskeletal:     Cervical back: Normal range of motion and neck supple.     Right hip: Normal.     Left hip: No tenderness, bony tenderness or crepitus. Normal range of motion. Normal strength.     Right lower leg: No edema.     Left lower leg: No edema.  Skin:    General: Skin is warm and dry.  Neurological:     Mental Status: She is alert and oriented to person, place, and time.  Psychiatric:        Attention and Perception: Attention normal.        Mood and Affect: Mood normal.        Behavior: Behavior normal. Behavior is cooperative.        Thought Content: Thought content normal.        Judgment: Judgment normal.    Results for orders placed or performed in visit on 04/06/20  Bayer DCA Hb A1c Waived  Result Value Ref Range   HB A1C (BAYER DCA - WAIVED) 5.8 <7.0 %  Microalbumin, Urine Waived  Result Value Ref Range   Microalb, Ur Waived 10 0 - 19 mg/L   Creatinine, Urine Waived 50 10 - 300 mg/dL   Microalb/Creat Ratio <30 <30 mg/g  Lipid Panel w/o Chol/HDL Ratio  Result Value Ref Range   Cholesterol, Total 136 100 - 199 mg/dL   Triglycerides 59 0 - 149 mg/dL   HDL 78 >39 mg/dL   VLDL Cholesterol Cal 13 5 - 40 mg/dL   LDL Chol Calc (NIH) 45 0 - 99 mg/dL  T4, free  Result Value Ref Range   Free T4 2.10 (H) 0.82 - 1.77 ng/dL  TSH  Result Value Ref Range   TSH 2.000 0.450 - 4.500 uIU/mL      Assessment & Plan:   Problem List Items Addressed This Visit      Digestive   Anal wart - Primary    On exam noted multiple lesions presenting like condyloma in cluster form.  At this time will start Imiquimod cream and educated patient on treatment and findings.  She reports being in monogamous relationship for several years.  Referral to dermatology for further recommendations and guidance.  Patient denies any history of same.      Relevant Medications   Imiquimod Pump 3.75 % CREA   Other Relevant Orders   Ambulatory referral to  Dermatology    Other Visit Diagnoses    Cervical cancer screening       Pap sent per patient request, she was educated on guideline  recommendations and wishes to continue pap testing.   Relevant Orders   Cytology - PAP       Follow up plan: Return for as scheduled upcoming.

## 2020-05-24 NOTE — Assessment & Plan Note (Signed)
On exam noted multiple lesions presenting like condyloma in cluster form.  At this time will start Imiquimod cream and educated patient on treatment and findings.  She reports being in monogamous relationship for several years.  Referral to dermatology for further recommendations and guidance.  Patient denies any history of same.

## 2020-05-26 LAB — CYTOLOGY - PAP
Adequacy: ABSENT
Comment: NEGATIVE
Diagnosis: NEGATIVE
High risk HPV: NEGATIVE

## 2020-05-26 NOTE — Progress Notes (Signed)
Please let Erica Larsen know her pap smear returned negative for lesion or cancerous findings and negative for precancerous findings.  Have a great day!!

## 2020-06-22 ENCOUNTER — Other Ambulatory Visit: Payer: Self-pay | Admitting: General Surgery

## 2020-06-22 NOTE — Progress Notes (Signed)
Subjective:     Patient ID: Erica Larsen is a 70 y.o. female.  HPI  The following portions of the patient's history were reviewed and updated as appropriate.  This an established patient is here today for: office visit. The patient has been referred today by Dallie Dad, PA for evaluation of a growth on her buttock. Patient states they have been there a couple of months. The patient states they are warts and she does not know how many she has.        Chief Complaint  Patient presents with  . Mass    buttock     BP (!) 144/78   Pulse 57   Temp 36.4 C (97.6 F)   Ht 157.5 cm (5\' 2" )   Wt 93 kg (205 lb)   SpO2 95%   BMI 37.49 kg/m       Past Medical History:  Diagnosis Date  . Anemia   . Chronic kidney disease   . Coronary artery disease   . Diabetes mellitus type 2, uncomplicated (CMS-HCC)   . Diverticulosis   . Esophagitis   . GERD (gastroesophageal reflux disease)   . Heart disease   . Hiatal hernia   . High cholesterol   . Hypercalcemia   . Hyperlipidemia   . Hypertension   . Hypothyroidism   . Lumbar radiculopathy   . Osteoarthritis   . Osteopenia      Past Surgical History:  Procedure Laterality Date  . CATARACT EXTRACTION Bilateral   . COLONOSCOPY  08/01/2015  . coronary angioplasty with stent placement    . EGD  05/14/2016  . givens capsule study  06/01/2015  . HEART STENT    . pre-cancer removed     left forearm  . REPAIR HIATAL HERNIA  09/24/2016  . squamous cell carcinoma surgery                 OB History    Gravida  2   Para  2   Term      Preterm      AB      Living        SAB      IAB      Ectopic      Molar      Multiple      Live Births          Obstetric Comments  Age at first period 35 Age of first pregnancy 35         Social History          Socioeconomic History  . Marital status: Married  Tobacco Use  . Smoking status: Never Smoker   . Smokeless tobacco: Never Used  Substance and Sexual Activity  . Alcohol use: No    Comment: occassionally  . Drug use: No  . Sexual activity: Defer           Allergies  Allergen Reactions  . Sulfa (Sulfonamide Antibiotics) Anaphylaxis  . Latex Rash    Current Medications        Current Outpatient Medications  Medication Sig Dispense Refill  . AMIOdarone (PACERONE) 200 MG tablet Take 200 mg by mouth once daily    . atorvastatin (LIPITOR) 20 MG tablet Take 80 mg by mouth once daily    . cyanocobalamin (VITAMIN B12) 1000 MCG tablet Take 1,000 mcg by mouth once daily    . ferrous sulfate 325 (65 FE) MG tablet Take 325 mg by mouth daily  with breakfast    . FUROsemide (LASIX) 40 MG tablet Take 40 mg by mouth 2 (two) times daily Patient takes 40 mg in the morning and 20 mg at noon.    . lansoprazole (PREVACID) 30 MG DR capsule Take 30 mg by mouth once daily.    [name] levothyroxine (SYNTHROID, LEVOTHROID) 100 MCG tablet Take 112 mcg by mouth once daily Take on an empty stomach with a glass of water at least 30-60 minutes before breakfast.    . metoprolol succinate (TOPROL-XL) 100 MG XL tablet Take 100 mg by mouth once daily.    [name] olmesartan (BENICAR) 40 MG tablet Take 40 mg by mouth once daily.    . pioglitazone (ACTOS) 30 MG tablet Take 30 mg by mouth once daily.    . raloxifene (EVISTA) 60 mg tablet Take 60 mg by mouth once daily.    . rivaroxaban (XARELTO) 20 mg tablet Take by mouth once daily    . RSH ONC polyethylene glycol 0.4%/propylene glycol 0.3% (IMMUNOGEN [id number]) -eIRB [id number] ophthalmic solution Apply 1-2 drops to eye once daily For Investigational use only. Instill 1-2 drops in each eye daily. Use 15 minutes after corticosteroid administration on days both to be used. Shake well before using.    [name] SITagliptin (JANUVIA) 25 MG tablet Take 25 mg by mouth once daily    . amLODIPine (NORVASC) 5 MG tablet Take 5 mg by mouth once daily. (Patient  not taking: Reported on 06/22/2020)    . aspirin 81 MG EC tablet Take 81 mg by mouth once daily. (Patient not taking: Reported on 06/22/2020)    . clopidogrel (PLAVIX) 75 mg tablet Take 75 mg by mouth once daily. (Patient not taking: Reported on 06/22/2020)     No current facility-administered medications for this visit.           Family History  Problem Relation Age of Onset  . Diabetes Mother   . Heart disease Mother   . Kidney failure Mother   . High blood pressure (Hypertension) Mother   . High blood pressure (Hypertension) Father   . Myocardial Infarction (Heart attack) Father   . Thyroid disease Father   . High blood pressure (Hypertension) Sister   . Diabetes Sister   . Colon cancer Brother   . Breast cancer Paternal Aunt   . Thyroid disease Daughter        Review of Systems  Constitutional: Negative for chills and fever.  Respiratory: Negative for cough.        Objective:   Physical Exam Exam conducted with a chaperone present.  Constitutional:      Appearance: Normal appearance.  Cardiovascular:     Rate and Rhythm: Normal rate and regular rhythm.     Pulses: Normal pulses.          Femoral pulses are 2+ on the right side and 2+ on the left side.    Heart sounds: Normal heart sounds.     Comments: The patient has a history of a cardiac murmur but I cannot appreciated on today's exam. Pulmonary:     Effort: Pulmonary effort is normal.     Breath sounds: Normal breath sounds.  Abdominal:     Palpations: Abdomen is soft.  Genitourinary:      Comments: Area of condyloma on both sides of the anus without clear extension into the anal canal.  Photo in media. Musculoskeletal:     Cervical back: Neck supple.  Lymphadenopathy:     Lower Body:  No right inguinal adenopathy. No left inguinal adenopathy.  Skin:    General: Skin is warm and dry.  Neurological:     Mental Status: She is alert and oriented to person, place, and time.   Psychiatric:        Mood and Affect: Mood normal.        Behavior: Behavior normal.     Labs and Radiology:   Biopsy of the perianal lesions completed June 05, 2020 showed inflamed condyloma acuminata.  June 05, 2020 dermatology notes reviewed.  Pap smear dated May 24, 2020: High risk HPV Negative   Adequacy Satisfactory for evaluation; transformation zone component ABSENT.   Diagnosis - Negative for intraepithelial lesion or malignancy (NILM)   Comment Normal Reference Range HPV - Negative     February 02, 2020 laboratory:  WBC 4.0 - 10.5 K/uL 5.6   RBC 3.87 - 5.11 MIL/uL 3.65Low   Hemoglobin 12.0 - 15.0 g/dL 11.9Low   HCT 36.0 - 46.0 % 36.5   MCV 80.0 - 100.0 fL 100.0   MCH 26.0 - 34.0 pg 32.6   MCHC 30.0 - 36.0 g/dL 32.6   RDW 11.5 - 15.5 % 12.6   Platelets 150 - 400 K/uL 146Low   nRBC 0.0 - 0.2 % 0.0   Neutrophils Relative % % 64   Neutro Abs 1.7 - 7.7 K/uL 3.5   Lymphocytes Relative % 22   Lymphs Abs 0.7 - 4.0 K/uL 1.3   Monocytes Relative % 12   Monocytes Absolute 0.1 - 1.0 K/uL 0.7   Eosinophils Relative % 2   Eosinophils Absolute 0.0 - 0.5 K/uL 0.1   Basophils Relative % 0   Basophils Absolute 0.0 - 0.1 K/uL 0.0   Immature Granulocytes % 0   Abs Immature Granulocytes 0.00 - 0.07 K/uL 0.02    Sodium 135 - 145 mmol/L 139   Potassium 3.5 - 5.1 mmol/L 4.5   Chloride 98 - 111 mmol/L 99   CO2 22 - 32 mmol/L 30   Glucose, Bld 70 - 99 mg/dL 120High   Comment: Glucose reference range applies only to samples taken after fasting for at least 8 hours.  BUN 8 - 23 mg/dL 18   Creatinine, Ser 0.44 - 1.00 mg/dL 1.48High   Calcium 8.9 - 10.3 mg/dL 9.0   GFR, Estimated >60 mL/min 38Low   Comment: (NOTE)  Calculated using the CKD-EPI Creatinine Equation (2021)   Anion gap 5 - 15 10     Cardiac echo of Jul 14, 2018: Showed an ejection fraction of 49% both mitral and tricuspid regurgitation noted on this study.  Mild LV systolic dysfunction  and mild RV systolic dysfunction.  Diffuse hypokinesis, mild.   Assessment:     Extensive perianal condyloma.  Options for management reviewed 1) use of Imiquimod cream or surgical excision.  Considering the extent of disease I think that it would be a long process for this to dissolve and leave a sizable defect that would eventually heal by secondary intent.  These are very superficial lesions and it is not necessary for the patient to hold her anticoagulation for the procedure. Patient to be scheduled for surgery at a convenient date. The patient will remain on her Xarelto.     This note is partially prepared by Ledell Noss, CMA acting as a scribe in the presence of Dr. Hervey Ard, MD.   The documentation recorded by the scribe accurately reflects the service I personally performed and the decisions made by me.  Robert Bellow, MD FACS

## 2020-06-23 ENCOUNTER — Telehealth: Payer: Self-pay | Admitting: Nurse Practitioner

## 2020-06-23 NOTE — Telephone Encounter (Signed)
Copied from [place] [id number]. Topic: Medicare AWV >> Jun 23, 2020 10:06 AM Cher Nakai R wrote: Reason for CRM:  06/23/20 LM re: AWVS rescheduled to do by phone on Jul 10, 2020 @ 11:15 am-srs

## 2020-06-27 ENCOUNTER — Encounter
Admission: RE | Admit: 2020-06-27 | Discharge: 2020-06-27 | Disposition: A | Discharge status: Home / Self Care | Payer: Medicare Other | Source: Ambulatory Visit | Attending: General Surgery | Admitting: General Surgery

## 2020-06-27 ENCOUNTER — Other Ambulatory Visit: Payer: Self-pay

## 2020-06-27 HISTORY — DX: Pneumonia, unspecified organism: J18.9

## 2020-06-27 NOTE — Patient Instructions (Addendum)
Your procedure is scheduled on:  Friday, May 13 Report to the Registration Desk on the 1st floor of the [facility]. To find out your arrival time, please call [phone] between 1PM - 3PM on: Thursday, May 12  REMEMBER: Instructions that are not followed completely may result in serious medical risk, up to and including death; or upon the discretion of your surgeon and anesthesiologist your surgery may need to be rescheduled.  Do not eat food after midnight the night before surgery.  No gum chewing, lozengers or hard candies.  You may however, drink water up to 2 hours before you are scheduled to arrive for your surgery. Do not drink anything within 2 hours of your scheduled arrival time.  TAKE THESE MEDICATIONS THE MORNING OF SURGERY WITH A SIP OF WATER:  1.  Amiodarone 2.  Atorvastatin 3.  Lansoprazole (Prevacid) 4.  Levothyroxine 5.  Metoprolol  Follow recommendations from Cardiologist, Pulmonologist or PCP regarding stopping Xarelto. According to Dr. Dwyane Luo note, no need to stop Xarelto.  One week prior to surgery: starting today, May 10 Stop Anti-inflammatories (NSAIDS) such as Advil, Aleve, Ibuprofen, Motrin, Naproxen, Naprosyn and Aspirin based products such as Excedrin, Goodys Powder, BC Powder. Stop ANY OVER THE COUNTER supplements until after surgery.  No Alcohol for 24 hours before or after surgery.  No Smoking including e-cigarettes for 24 hours prior to surgery.  No chewable tobacco products for at least 6 hours prior to surgery.  No nicotine patches on the day of surgery.  Do not use any "recreational" drugs for at least a week prior to your surgery.  Please be advised that the combination of cocaine and anesthesia may have negative outcomes, up to and including death. If you test positive for cocaine, your surgery will be cancelled.  On the morning of surgery brush your teeth with toothpaste and water, you may rinse your mouth with mouthwash if you  wish. Do not swallow any toothpaste or mouthwash.  Do not wear jewelry, make-up, hairpins, clips or nail polish.  Do not wear lotions, powders, or perfumes.   Do not shave body from the neck down 48 hours prior to surgery just in case you cut yourself which could leave a site for infection.   Do not bring valuables to the hospital. [facility] is not responsible for any missing/lost belongings or valuables.   Notify your doctor if there is any change in your medical condition (cold, fever, infection).  Wear comfortable clothing (specific to your surgery type) to the hospital.  If you are being discharged the day of surgery, you will not be allowed to drive home. You will need a responsible adult (18 years or older) to drive you home and stay with you that night.   If you are taking public transportation, you will need to have a responsible adult (18 years or older) with you. Please confirm with your physician that it is acceptable to use public transportation.   Please call the [facility] at [phone] if you have any questions about these instructions.  Surgery Visitation Policy:  Patients undergoing a surgery or procedure may have one family member or support person with them as long as that person is not COVID-19 positive or experiencing its symptoms.  That person may remain in the waiting area during the procedure.

## 2020-06-28 ENCOUNTER — Encounter: Payer: Self-pay | Admitting: Urgent Care

## 2020-06-28 ENCOUNTER — Encounter
Admission: RE | Admit: 2020-06-28 | Discharge: 2020-06-28 | Disposition: A | Discharge status: Home / Self Care | Payer: Medicare Other | Source: Ambulatory Visit | Attending: General Surgery | Admitting: General Surgery

## 2020-06-28 ENCOUNTER — Encounter: Payer: Self-pay | Admitting: General Surgery

## 2020-06-28 DIAGNOSIS — I10 Essential (primary) hypertension: Secondary | ICD-10-CM | POA: Diagnosis not present

## 2020-06-28 DIAGNOSIS — I2510 Atherosclerotic heart disease of native coronary artery without angina pectoris: Secondary | ICD-10-CM | POA: Insufficient documentation

## 2020-06-28 DIAGNOSIS — Z01810 Encounter for preprocedural cardiovascular examination: Secondary | ICD-10-CM | POA: Diagnosis not present

## 2020-06-28 DIAGNOSIS — E119 Type 2 diabetes mellitus without complications: Secondary | ICD-10-CM | POA: Diagnosis not present

## 2020-06-29 ENCOUNTER — Other Ambulatory Visit: Payer: Self-pay

## 2020-06-29 ENCOUNTER — Emergency Department: Payer: Medicare Other

## 2020-06-29 ENCOUNTER — Emergency Department
Admission: EM | Admit: 2020-06-29 | Discharge: 2020-06-29 | Disposition: A | Discharge status: Home / Self Care | Payer: Medicare Other | Attending: Student in an Organized Health Care Education/Training Program | Admitting: Student in an Organized Health Care Education/Training Program

## 2020-06-29 ENCOUNTER — Encounter: Payer: Self-pay | Admitting: General Surgery

## 2020-06-29 ENCOUNTER — Encounter: Payer: Self-pay | Admitting: Emergency Medicine

## 2020-06-29 DIAGNOSIS — Z955 Presence of coronary angioplasty implant and graft: Secondary | ICD-10-CM | POA: Insufficient documentation

## 2020-06-29 DIAGNOSIS — I10 Essential (primary) hypertension: Secondary | ICD-10-CM | POA: Diagnosis not present

## 2020-06-29 DIAGNOSIS — N184 Chronic kidney disease, stage 4 (severe): Secondary | ICD-10-CM | POA: Insufficient documentation

## 2020-06-29 DIAGNOSIS — R079 Chest pain, unspecified: Secondary | ICD-10-CM | POA: Diagnosis not present

## 2020-06-29 DIAGNOSIS — E1122 Type 2 diabetes mellitus with diabetic chronic kidney disease: Secondary | ICD-10-CM | POA: Diagnosis not present

## 2020-06-29 DIAGNOSIS — R072 Precordial pain: Secondary | ICD-10-CM | POA: Insufficient documentation

## 2020-06-29 DIAGNOSIS — Z7901 Long term (current) use of anticoagulants: Secondary | ICD-10-CM | POA: Diagnosis not present

## 2020-06-29 DIAGNOSIS — Z8616 Personal history of COVID-19: Secondary | ICD-10-CM | POA: Insufficient documentation

## 2020-06-29 DIAGNOSIS — R0789 Other chest pain: Secondary | ICD-10-CM | POA: Diagnosis not present

## 2020-06-29 DIAGNOSIS — Z79899 Other long term (current) drug therapy: Secondary | ICD-10-CM | POA: Insufficient documentation

## 2020-06-29 DIAGNOSIS — I130 Hypertensive heart and chronic kidney disease with heart failure and stage 1 through stage 4 chronic kidney disease, or unspecified chronic kidney disease: Secondary | ICD-10-CM | POA: Diagnosis not present

## 2020-06-29 DIAGNOSIS — Z91040 Latex allergy status: Secondary | ICD-10-CM | POA: Insufficient documentation

## 2020-06-29 DIAGNOSIS — I5032 Chronic diastolic (congestive) heart failure: Secondary | ICD-10-CM | POA: Diagnosis not present

## 2020-06-29 DIAGNOSIS — E039 Hypothyroidism, unspecified: Secondary | ICD-10-CM | POA: Insufficient documentation

## 2020-06-29 DIAGNOSIS — K449 Diaphragmatic hernia without obstruction or gangrene: Secondary | ICD-10-CM | POA: Diagnosis not present

## 2020-06-29 LAB — HEPATIC FUNCTION PANEL
ALT: 38 U/L (ref 0–44)
AST: 42 U/L — ABNORMAL HIGH (ref 15–41)
Albumin: 4.3 g/dL (ref 3.5–5.0)
Alkaline Phosphatase: 78 U/L (ref 38–126)
Bilirubin, Direct: 0.3 mg/dL — ABNORMAL HIGH (ref 0.0–0.2)
Indirect Bilirubin: 1 mg/dL — ABNORMAL HIGH (ref 0.3–0.9)
Total Bilirubin: 1.3 mg/dL — ABNORMAL HIGH (ref 0.3–1.2)
Total Protein: 7.8 g/dL (ref 6.5–8.1)

## 2020-06-29 LAB — COMPREHENSIVE METABOLIC PANEL
ALT: 37 U/L (ref 0–44)
AST: 40 U/L (ref 15–41)
Albumin: 4.2 g/dL (ref 3.5–5.0)
Alkaline Phosphatase: 73 U/L (ref 38–126)
Anion gap: 11 (ref 5–15)
BUN: 23 mg/dL (ref 8–23)
CO2: 26 mmol/L (ref 22–32)
Calcium: 9 mg/dL (ref 8.9–10.3)
Chloride: 100 mmol/L (ref 98–111)
Creatinine, Ser: 1.6 mg/dL — ABNORMAL HIGH (ref 0.44–1.00)
GFR, Estimated: 34 mL/min — ABNORMAL LOW (ref >60)
Glucose, Bld: 152 mg/dL — ABNORMAL HIGH (ref 70–99)
Potassium: 4.2 mmol/L (ref 3.5–5.1)
Sodium: 137 mmol/L (ref 135–145)
Total Bilirubin: 1.2 mg/dL (ref 0.3–1.2)
Total Protein: 7.7 g/dL (ref 6.5–8.1)

## 2020-06-29 LAB — CBC
HCT: 39.6 % (ref 36.0–46.0)
Hemoglobin: 13.1 g/dL (ref 12.0–15.0)
MCH: 33.9 pg (ref 26.0–34.0)
MCHC: 33.1 g/dL (ref 30.0–36.0)
MCV: 102.6 fL — ABNORMAL HIGH (ref 80.0–100.0)
Platelets: 122 10*3/uL — ABNORMAL LOW (ref 150–400)
RBC: 3.86 MIL/uL — ABNORMAL LOW (ref 3.87–5.11)
RDW: 13.6 % (ref 11.5–15.5)
WBC: 9.7 10*3/uL (ref 4.0–10.5)
nRBC: 0 % (ref 0.0–0.2)

## 2020-06-29 LAB — TROPONIN I (HIGH SENSITIVITY)
Troponin I (High Sensitivity): 7 ng/L (ref <18)
Troponin I (High Sensitivity): 6 ng/L (ref <18)

## 2020-06-29 MED ORDER — ISOSORBIDE MONONITRATE ER 30 MG PO TB24: 30.0000 mg | ORAL_TABLET | Freq: Every day | ORAL | 0 refills | Status: DC

## 2020-06-29 MED ORDER — NITROGLYCERIN 0.4 MG SL SUBL
0.4000 mg | SUBLINGUAL_TABLET | SUBLINGUAL | Status: DC | PRN
  Administered 2020-06-29 (×2): 0.4 mg via SUBLINGUAL
  Filled 2020-06-29: qty 1

## 2020-06-29 NOTE — ED Provider Notes (Signed)
[facility] Emergency Department Provider Note    Event Date/Time   First MD Initiated Contact with Patient 06/29/20 0900     (approximate)  I have reviewed the triage vital signs and the nursing notes.   HISTORY  Chief Complaint Chest Pain    HPI Erica Larsen is a 70 y.o. female blows to past medical history presents the ER for evaluation of midsternal chest pain radiating the left side started yesterday.  Waxing and waning.  Denies any pain with deep inspiration.  Has had a chronic cough.  He relates that he productive cough nomothetic fevers.  Denies any abdominal pain.  No nausea or vomiting.  She is on Xarelto.  Is been compliant with her medications.  Denies any palpitations.  Has a history of reflux.    Past Medical History:  Diagnosis Date  . A-fib ([place])   . Anemia of chronic disease 2017  . Chronic diastolic HF (heart failure) ([place])   . CKD (chronic kidney disease), stage IV ([place])   . Coronary artery disease    a.) PCI on 12/05/2006 - 80% stenosis mLAD and 70% stenosis RCA; Xience V 2.75 x 23 mm DES to mLAD   . COVID-19 virus detected 08/14/2018  . Diverticulosis   . Esophagitis   . GERD (gastroesophageal reflux disease)   . Grade III diastolic dysfunction   . Hiatal hernia   . High cholesterol   . Hypercalcemia   . Hypertension   . Hypothyroidism   . LBBB (left bundle branch block)   . Lumbar radiculopathy   . Obesity   . Osteoarthritis   . Osteopenia    Hips  . PAH (pulmonary artery hypertension) ([facility])    mild per 07/14/2018 TTE  . Pneumonia   . T2DM (type 2 diabetes mellitus) ([place])   . Valvular insufficiency    a. MODERATE PV, TV, MV regurgitation on 07/14/2018 TTE   Family History  Problem Relation Age of Onset  . Heart disease Mother   . Kidney failure Mother   . Hypertension Mother   . Diabetes Mother   . Heart attack Father   . Hypertension Father   . Thyroid disease Father   . Hypertension Sister   . Diabetes  Sister   . Thyroid disease Daughter   . Thyroid disease Daughter   . Breast cancer Paternal Aunt    Past Surgical History:  Procedure Laterality Date  . CATARACT EXTRACTION Bilateral    May - June 2018  . COLONOSCOPY WITH PROPOFOL N/A 08/01/2015   Procedure: COLONOSCOPY WITH PROPOFOL;  Surgeon: Lucilla Lame, MD;  Location: [facility] ENDOSCOPY;  Service: Endoscopy;  Laterality: N/A;  . CORONARY ANGIOPLASTY WITH STENT PLACEMENT  12/05/2006   80% stenosis mLAD and 70% stenosis RCA; Xience V 2.75 x 23 mm DES to mLAD   . ESOPHAGOGASTRODUODENOSCOPY (EGD) WITH PROPOFOL N/A 05/14/2016   Procedure: ESOPHAGOGASTRODUODENOSCOPY (EGD) WITH PROPOFOL;  Surgeon: Lucilla Lame, MD;  Location: [facility] ENDOSCOPY;  Service: Endoscopy;  Laterality: N/A;  . EYE SURGERY    . GIVENS CAPSULE STUDY N/A 06/01/2015   Procedure: GIVENS CAPSULE STUDY;  Surgeon: Lucilla Lame, MD;  Location: [facility] ENDOSCOPY;  Service: Endoscopy;  Laterality: N/A;  . HIATAL HERNIA REPAIR N/A 09/24/2016   Procedure: LAPAROSCOPIC REPAIR OF HIATAL HERNIA WITH FUNDOPLICATION WITH MESH (BIO-A);  Surgeon: Jules Husbands, MD;  Location: [facility] ORS;  Service: General;  Laterality: N/A;  Lithotomy position w arms tucked.   . pre cancer removed  left forearm  . squamous cell carcinoma surgery     back, forehead, left forearm   Patient Active Problem List   Diagnosis Date Noted  . Anal wart 05/24/2020  . Osteopenia 04/06/2020  . BMI 37.0-37.9, adult 04/06/2020  . Left hip pain 04/06/2020  . B12 deficiency 10/05/2019  . Acquired thrombophilia ([place]) 03/22/2019  . Atrial fibrillation ([name]) 09/29/2018  . Anemia due to stage 4 chronic kidney disease ([place]) 08/06/2018  . Morbid obesity ([place]) 04/08/2018  . Hypothyroid 03/25/2018  . Hiatal hernia 09/24/2016  . Advanced care planning/counseling discussion 05/31/2016  . Type 2 diabetes mellitus with stage 4 chronic kidney disease, without long-term current use of insulin ([place]) 12/19/2015  . Elevated AST (SGOT)  09/19/2015  . GERD with stricture 09/18/2015  . Benign neoplasm of descending colon   . Hyperlipidemia associated with type 2 diabetes mellitus ([place]) 03/08/2015  . Iron deficiency anemia due to chronic blood loss 12/12/2014  . CAD (coronary artery disease) 11/25/2014  . Chronic kidney disease, stage 4 (severe) ([place]) 11/25/2014  . Chronic diastolic heart failure ([place]) 08/17/2014  . 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 Authorizing Provider  isosorbide mononitrate (IMDUR) 30 MG 24 hr tablet Take 1 tablet (30 mg total) by mouth daily. 06/29/20 06/29/21 Yes Merlyn Lot, MD  amiodarone (PACERONE) 200 MG tablet Take 200 mg by mouth in the morning. 02/10/19   [provider]  atorvastatin (LIPITOR) 80 MG tablet Take 80 mg by mouth daily after breakfast.  09/06/16   [provider]  Cholecalciferol (VITAMIN D3) 50 MCG (2000 UT) TABS Take 2,000 Units by mouth in the morning.    [provider]  Ferrous Sulfate (SLOW FE PO) Take 1 tablet by mouth in the morning.    [provider]  furosemide (LASIX) 40 MG tablet Take 40 mg by mouth in the morning. 07/13/19   [provider]  Imiquimod Pump 3.75 % CREA Apply a thin layer once daily (using 1 full pump) prior to bedtime; leave on skin for 8 hours, then remove with mild soap and water in morning.  Continue application nightly until complete resolution of warts -- often 8 weeks total. Patient not taking: No sig reported 05/24/20   Cannady, Jolene T, NP  JANUVIA 25 MG tablet TAKE 1 TABLET(25 MG) BY MOUTH DAILY Patient taking differently: Take 25 mg by mouth daily. 05/04/20   Cannady, Henrine Screws T, NP  lansoprazole (PREVACID) 30 MG capsule TAKE ONE CAPSULE BY MOUTH DAILY AT 12:00 NOON Patient taking differently: Take 30 mg by mouth in the morning. 11/14/19   Cannady, Henrine Screws T, NP   levothyroxine (SYNTHROID) 112 MCG tablet TAKE 1 TABLET(112 MCG) BY MOUTH DAILY Patient taking differently: Take 112 mcg by mouth daily before breakfast. 04/06/20   Marnee Guarneri T, NP  metoprolol succinate (TOPROL-XL) 100 MG 24 hr tablet Take 100 mg by mouth in the morning. 07/14/18   [provider]  olmesartan (BENICAR) 40 MG tablet Take 40 mg by mouth daily. 05/04/20   [provider]  Polyethyl Glycol-Propyl Glycol (SYSTANE) 0.4-0.3 % SOLN Place 1 drop into both eyes in the morning and at bedtime.    [provider]  raloxifene (EVISTA) 60 MG tablet TAKE 1 TABLET(60 MG) BY MOUTH DAILY Patient taking differently: Take 60 mg by mouth in the morning. 09/26/19   Cannady,  Jolene T, NP  vitamin B-12 (CYANOCOBALAMIN) 1000 MCG tablet Take 1,000 mcg by mouth in the morning.    [provider]  XARELTO 20 MG TABS tablet Take 20 mg by mouth in the morning. 08/04/18   [provider]    Allergies Sulfa antibiotics and Latex    Social History Social History   Tobacco Use  . Smoking status: Never Smoker  . Smokeless tobacco: Never Used  Vaping Use  . Vaping Use: Never used  Substance Use Topics  . Alcohol use: Yes    Comment: occassional  . Drug use: No    Review of Systems Patient denies headaches, rhinorrhea, blurry vision, numbness, shortness of breath, chest pain, edema, cough, abdominal pain, nausea, vomiting, diarrhea, dysuria, fevers, rashes or hallucinations unless otherwise stated above in HPI. ____________________________________________   PHYSICAL EXAM:  VITAL SIGNS: Vitals:   06/29/20 1029 06/29/20 1139  BP: 123/69 112/63  Pulse: 70 65  Resp: 19 20  Temp:    SpO2: 96% 96%    Constitutional: Alert and oriented.  Eyes: Conjunctivae are normal.  Head: Atraumatic. Nose: No congestion/rhinnorhea. Mouth/Throat: Mucous membranes are moist.   Neck: No stridor. Painless ROM.  Cardiovascular: Normal rate, regular rhythm. Grossly  normal heart sounds.  Good peripheral circulation. Respiratory: Normal respiratory effort.  No retractions. Lungs CTAB. Gastrointestinal: Soft and nontender. No distention. No abdominal bruits. No CVA tenderness. Genitourinary:  Musculoskeletal: No lower extremity tenderness nor edema.  No joint effusions. Neurologic:  Normal speech and language. No gross focal neurologic deficits are appreciated. No facial droop Skin:  Skin is warm, dry and intact. No rash noted. Psychiatric: Mood and affect are normal. Speech and behavior are normal.  ____________________________________________   LABS (all labs ordered are listed, but only abnormal results are displayed)  Results for orders placed or performed during the hospital encounter of 06/29/20 (from the past 24 hour(s))  CBC     Status: Abnormal   Collection Time: 06/29/20  8:55 AM  Result Value Ref Range   WBC 9.7 4.0 - 10.5 K/uL   RBC 3.86 (L) 3.87 - 5.11 MIL/uL   Hemoglobin 13.1 12.0 - 15.0 g/dL   HCT 39.6 36.0 - 46.0 %   MCV 102.6 (H) 80.0 - 100.0 fL   MCH 33.9 26.0 - 34.0 pg   MCHC 33.1 30.0 - 36.0 g/dL   RDW 13.6 11.5 - 15.5 %   Platelets 122 (L) 150 - 400 K/uL   nRBC 0.0 0.0 - 0.2 %  Hepatic function panel     Status: Abnormal   Collection Time: 06/29/20 10:06 AM  Result Value Ref Range   Total Protein 7.8 6.5 - 8.1 g/dL   Albumin 4.3 3.5 - 5.0 g/dL   AST 42 (H) 15 - 41 U/L   ALT 38 0 - 44 U/L   Alkaline Phosphatase 78 38 - 126 U/L   Total Bilirubin 1.3 (H) 0.3 - 1.2 mg/dL   Bilirubin, Direct 0.3 (H) 0.0 - 0.2 mg/dL   Indirect Bilirubin 1.0 (H) 0.3 - 0.9 mg/dL  Troponin I (High Sensitivity)     Status: None   Collection Time: 06/29/20 10:06 AM  Result Value Ref Range   Troponin I (High Sensitivity) 7 <18 ng/L  Comprehensive metabolic panel     Status: Abnormal   Collection Time: 06/29/20 10:06 AM  Result Value Ref Range   Sodium 137 135 - 145 mmol/L   Potassium 4.2 3.5 - 5.1 mmol/L   Chloride 100 98 -  111 mmol/L    CO2 26 22 - 32 mmol/L   Glucose, Bld 152 (H) 70 - 99 mg/dL   BUN 23 8 - 23 mg/dL   Creatinine, Ser 1.60 (H) 0.44 - 1.00 mg/dL   Calcium 9.0 8.9 - 10.3 mg/dL   Total Protein 7.7 6.5 - 8.1 g/dL   Albumin 4.2 3.5 - 5.0 g/dL   AST 40 15 - 41 U/L   ALT 37 0 - 44 U/L   Alkaline Phosphatase 73 38 - 126 U/L   Total Bilirubin 1.2 0.3 - 1.2 mg/dL   GFR, Estimated 34 (L) >60 mL/min   Anion gap 11 5 - 15  Troponin I (High Sensitivity)     Status: None   Collection Time: 06/29/20 12:31 PM  Result Value Ref Range   Troponin I (High Sensitivity) 6 <18 ng/L   ____________________________________________  EKG My review and personal interpretation at Time: 8:35   Indication: chest pain  Rate: 60  Rhythm: sinus Axis: right Other: normal qt, nonspecific st and t wave abn, no stemi ____________________________________________  RADIOLOGY  I personally reviewed all radiographic images ordered to evaluate for the above acute complaints and reviewed radiology reports and findings.  These findings were personally discussed with the patient.  Please see medical record for radiology report.  ____________________________________________   PROCEDURES  Procedure(s) performed:  Procedures    Critical Care performed: no ____________________________________________   INITIAL IMPRESSION / ASSESSMENT AND PLAN / ED COURSE  Pertinent labs & imaging results that were available during my care of the patient were reviewed by me and considered in my medical decision making (see chart for details).   DDX: ACS, pericarditis, esophagitis, boerhaaves, pe, dissection, pna, bronchitis, costochondritis   AALA RANSOM is a 70 y.o. who presents to the ED with presentation as described above.  Patient nontoxic-appearing hemodynamically stable mildly elevated blood pressure.  EKG consistent with previous.  Initial troponin negative.  Given her age and risk factors will observe here in the ER for serial enzyme.   Doubt PE she has poorly on anticoagulation no pleuritic pain no hypoxia no tachycardia.  Not consistent with dissection.  Her abdominal exam is soft and benign.  Clinical Course as of 06/29/20 1347  Thu Jun 29, 2020  1128 Patient reassessed.  Work-up thus far is reassuring.  Repeat exam benign.  She states she is not having any persistent discomfort.  Will repeat troponin. [PR]  1346 Discussed case in consultation with Dr. Yancey Flemings of cardiology.  Discussed observation versus close outpatient follow-up given her elevated heart score but otherwise reassuring work-up here in the ER.  She is currently pain-free.  Has recommended Imdur and will see patient in clinic tomorrow.  Patient is comfortable with this plan. [PR]    Clinical Course User Index [PR] Merlyn Lot, MD    The patient was evaluated in Emergency Department today for the symptoms described in the history of present illness. He/she was evaluated in the context of the global COVID-19 pandemic, which necessitated consideration that the patient might be at risk for infection with the SARS-CoV-2 virus that causes COVID-19. Institutional protocols and algorithms that pertain to the evaluation of patients at risk for COVID-19 are in a state of rapid change based on information released by regulatory bodies including the CDC and federal and state organizations. These policies and algorithms were followed during the patient's care in the ED.  As part of my medical decision making, I reviewed the following data within the  electronic MEDICAL RECORD NUMBER Nursing notes reviewed and incorporated, Labs reviewed, notes from prior ED visits and [place] Controlled Substance Database   ____________________________________________   FINAL CLINICAL IMPRESSION(S) / ED DIAGNOSES  Final diagnoses:  Nonspecific chest pain      NEW MEDICATIONS STARTED DURING THIS VISIT:  New Prescriptions   ISOSORBIDE MONONITRATE (IMDUR) 30 MG 24 HR TABLET    Take 1 tablet  (30 mg total) by mouth daily.     Note:  This document was prepared using Dragon voice recognition software and may include unintentional dictation errors.    Merlyn Lot, MD 06/29/20 [phone]

## 2020-06-29 NOTE — Progress Notes (Signed)
Perioperative Services  Pre-Admission/Anesthesia Testing Clinical Review  Date: 06/29/20  Patient Demographics:  Name: Erica Larsen DOB:   18-Oct-1950 MRN:   [id number]  Planned Surgical Procedure(s):    Case: [id number] Date/Time: 06/30/20 9147   Procedure: FULGURATION ANAL WART (N/A ) - excision perianal warts   Anesthesia type: General   Pre-op diagnosis: perianal warts   Location: [facility] OR ROOM 09 / [facility]   Surgeons: Robert Bellow, MD     NOTE: Available PAT nursing documentation and vital signs have been reviewed. Clinical nursing staff has updated patient's PMH/PSHx, current medication list, and drug allergies/intolerances to ensure comprehensive history available to assist in medical decision making as it pertains to the aforementioned surgical procedure and anticipated anesthetic course.   Clinical Discussion:  Erica Larsen is a 69 y.o. female who is submitted for pre-surgical anesthesia review and clearance prior to her undergoing the above procedure. Patient has never been a smoker. Pertinent PMH includes: CAD, atrial fibrillation, HFpEF (G3DD), valvular insufficiency, mild PAH,  LBBB, HTN, HLD, T2DM, CKD-IV, hypothyroidism, GERD (on daily PPI), anemia of chronic disease, OA.  Patient is followed by cardiology Humphrey Rolls, MD). She was last seen in the cardiology clinic on 03/27/2020; notes reviewed.  At the time of her clinic visit, patient denied any episodes of chest pain.  She noted episodes of exertional dyspnea with long distance ambulation, however noted that overall she had improved.  Patient with peripheral edema that is worse at night.  No PND, orthopnea, palpitations, vertiginous symptoms, or presyncope/syncope. Patient has a history significant for CAD. Left heart catheterization performed in 11/2006 revealed 80% stenosis of the mid LAD and 70% stenosis of the RCA; PCI performed and DES placed to the mid LAD lesion.  Last TTE performed on  07/16/2019 demonstrated mild global systolic dysfunction (W2NF) with an LVEF of 49%, moderate valvular insufficiency, mild PAH, and a trivial posterior pericardial effusion.  Coronary CTA performed in 09/2014 revealed a calcium score of 598.  There was moderate disease in the RCA and moderate disease proximal to the stent in the mid LAD; there was no significant in-stent restenosis (see full potential cardiovascular intervention below).  She also has a diagnosis of paroxysmal atrial fibrillation. CHA2DS2-VASc Score = 5 (age, sex, CHF, HTN, T2DM).  She is on chronic daily anticoagulation therapy using rivaroxaban; compliant with therapy with evidence of GI bleeding. Functional capacity, as defined by DASI, is documented as being >/= 4 METS.  No changes were made to patient's medication regimen.  Patient to follow-up with outpatient cardiology in 4 months or sooner if needed.  Patient is scheduled for an elective perianal wart excision on 06/30/2020 with Dr. Hervey Ard.  Given patient's past medical history significant for cardiovascular diagnoses, presurgical cardiac clearance was sought by the performing surgeon's office and PAT team. Per cardiology, "this patient is optimized for surgery and may proceed with the planned procedural course with a LOW risk stratification". This patient is on daily anticoagulation therapy. She has been instructed on recommendations for continuing her rivaroxaban throughout the perioperative period by Dr. Bary Castilla.  Patient denies previous perioperative complications with anesthesia in the past. In review of the available records, it is noted that patient underwent a general anesthetic course here (ASA III) in 09/2016 without documented complications.   Vitals with BMI 06/27/2020 05/24/2020 04/20/2020  Height _0  - -  Weight 205 lbs 207 lbs 3 oz -  BMI 62.13 - -  Systolic - 086 578  Diastolic - 76 67  Pulse - 52 53    Providers/Specialists:   NOTE: Primary physician  provider listed below. Patient may have been seen by APP or partner within same practice.   PROVIDER ROLE / SPECIALTY LAST OV  Bary Castilla Forest Gleason, MD  General Surgery  06/22/2020  Venita Lick, NP  Primary Care Provider  05/24/2020  Neoma Laming, MD  Cardiology  03/27/2020   Allergies:  Sulfa antibiotics and Latex  Current Home Medications:   No current facility-administered medications for this encounter.   [name] amiodarone (PACERONE) 200 MG tablet  . atorvastatin (LIPITOR) 80 MG tablet  . Cholecalciferol (VITAMIN D3) 50 MCG (2000 UT) TABS  . Ferrous Sulfate (SLOW FE PO)  . furosemide (LASIX) 40 MG tablet  . JANUVIA 25 MG tablet  . lansoprazole (PREVACID) 30 MG capsule  . levothyroxine (SYNTHROID) 112 MCG tablet  . metoprolol succinate (TOPROL-XL) 100 MG 24 hr tablet  . olmesartan (BENICAR) 40 MG tablet  . Polyethyl Glycol-Propyl Glycol (SYSTANE) 0.4-0.3 % SOLN  . raloxifene (EVISTA) 60 MG tablet  . vitamin B-12 (CYANOCOBALAMIN) 1000 MCG tablet  . XARELTO 20 MG TABS tablet  . Imiquimod Pump 3.75 % CREA   History:   Past Medical History:  Diagnosis Date  . A-fib ([place])   . Anemia of chronic disease 2017  . CHF (congestive heart failure) ([place])   . Chronic diastolic HF (heart failure) ([place])   . CKD (chronic kidney disease), stage IV ([place])   . Coronary artery disease    a.) PCI on 12/05/2006 - 80% stenosis mLAD and 70% stenosis RCA; Xience V 2.75 x 23 mm DES to mLAD   . COVID-19 virus detected 08/14/2018  . Diverticulosis   . Esophagitis   . GERD (gastroesophageal reflux disease)   . Hiatal hernia   . High cholesterol   . Hypercalcemia   . Hypertension   . Hypothyroidism   . LBBB (left bundle branch block)   . Lumbar radiculopathy   . Obesity   . Osteoarthritis   . Osteopenia    Hips  . Pneumonia   . T2DM (type 2 diabetes mellitus) ([place])   . Thyroid disease    Past Surgical History:  Procedure Laterality Date  . CATARACT EXTRACTION Bilateral    May - June  2018  . COLONOSCOPY WITH PROPOFOL N/A 08/01/2015   Procedure: COLONOSCOPY WITH PROPOFOL;  Surgeon: Lucilla Lame, MD;  Location: [facility] ENDOSCOPY;  Service: Endoscopy;  Laterality: N/A;  . CORONARY ANGIOPLASTY WITH STENT PLACEMENT  12/05/2006   80% stenosis mLAD and 70% stenosis RCA; Xience V 2.75 x 23 mm DES to mLAD   . ESOPHAGOGASTRODUODENOSCOPY (EGD) WITH PROPOFOL N/A 05/14/2016   Procedure: ESOPHAGOGASTRODUODENOSCOPY (EGD) WITH PROPOFOL;  Surgeon: Lucilla Lame, MD;  Location: [facility] ENDOSCOPY;  Service: Endoscopy;  Laterality: N/A;  . EYE SURGERY    . GIVENS CAPSULE STUDY N/A 06/01/2015   Procedure: GIVENS CAPSULE STUDY;  Surgeon: Lucilla Lame, MD;  Location: [facility] ENDOSCOPY;  Service: Endoscopy;  Laterality: N/A;  . HIATAL HERNIA REPAIR N/A 09/24/2016   Procedure: LAPAROSCOPIC REPAIR OF HIATAL HERNIA WITH FUNDOPLICATION WITH MESH (BIO-A);  Surgeon: Jules Husbands, MD;  Location: [facility] ORS;  Service: General;  Laterality: N/A;  Lithotomy position w arms tucked.   . pre cancer removed     left forearm  . squamous cell carcinoma surgery     back, forehead, left forearm   Family History  Problem Relation Age of Onset  . Heart disease Mother   .  Kidney failure Mother   . Hypertension Mother   . Diabetes Mother   . Heart attack Father   . Hypertension Father   . Thyroid disease Father   . Hypertension Sister   . Diabetes Sister   . Thyroid disease Daughter   . Thyroid disease Daughter   . Breast cancer Paternal Aunt    Social History   Tobacco Use  . Smoking status: Never Smoker  . Smokeless tobacco: Never Used  Vaping Use  . Vaping Use: Never used  Substance Use Topics  . Alcohol use: Yes    Comment: occassional  . Drug use: No    Pertinent Clinical Results:  LABS: Labs reviewed: Acceptable for surgery.   Ref Range & Units 1 mo ago  WBC 3.8 - 10.8 Thousand/uL 5.6   RBC 3.80 - 5.10 Million/uL 3.57 Low    Hemoglobin 11.7 - 15.5 g/dL 12.1   Hematocrit 35.0 - 45.0 % 36.6   MCV 80.0  - 100.0 fL 102.5 High    MCH 27.0 - 33.0 pg 33.9 High    MCHC 32.0 - 36.0 g/dL 33.1   RDW 11.0 - 15.0 % 13.5   Platelets 140 - 400 Thousand/uL 111 Low    MPV 7.5 - 12.5 fL 11.7   Resulting Agency  [facility]   Specimen Collected: 05/22/20 12:00 AM Last Resulted: 05/23/20 12:04 PM  Received From: [facility]  Result Received: 05/24/20  2:49 PM     Ref Range & Units 1 mo ago Comments  Glucose 65 - 99 mg/dL 144 High   Fasting reference interval for someone without known diabetes, a glucose  value >125 mg/dL indicates that they may have diabetes and this should be confirmed with a  follow-up test.          BUN 7 - 25 mg/dL 28 High     Creatinine 0.60 - 0.93 mg/dL 1.71 High        For patients >62 years of age, the reference limit       for Creatinine is approximately 13% higher for people       identified as African-American.          eGFR Non-African > OR = 60 mL/min/1.35m 30 Low     eGFR African > OR = 60 mL/min/1.748m34 Low     BUN/Creatinine Ratio 6 - 22 (calc) 16    Sodium 135 - 146 mmol/L 140    Potassium 3.5 - 5.3 mmol/L 4.7    Chloride 98 - 110 mmol/L 103    Bicarbonate (CO2) 20 - 32 mmol/L 24    Calcium 8.6 - 10.4 mg/dL 8.9    Phosphorus 2.1 - 4.3 mg/dL 3.9    Albumin 3.6 - 5.1 g/dL 4.1    Resulting Agency  [facility]    Specimen Collected: 05/22/20 12:00 AM Last Resulted: 05/23/20 12:04 PM  Received From: [facility]  Result Received: 05/24/20  2:49 PM    ECG: Date: 06/28/2020 Time ECG obtained: 1335 PM Rate: 65 bpm Rhythm:  Sinus rhythm with first-degree AV block; LBBB Axis (leads I and aVF): Left axis deviation Intervals: PR 242 ms. QRS 146 ms. QTc 505 ms. ST segment and T wave changes: No evidence of acute ST segment elevation or depression Comparison: Similar to previous tracing obtained on 05/30/2015   IMAGING / PROCEDURES: ECHOCARDIOGRAM performed on 07/16/2019 1. LVEF 60-65% 2. Severely dilated LA and mildly dilated RA with  ventricles aorta appears normal in size 3. G3DD (  restrictive physiology) diastolic dysfunction 4. Right ventricular systolic dysfunction 5. Mild diffuse hypokinesis 6. Abnormal right ventricular wall motion 7. Moderate pulmonary regurgitation 8. Moderate tricuspid regurgitation 9. Mild pulmonary hypertension 10. Moderate mitral valve regurgitation 11. Trivial posterior pericardial effusion without hemodynamic compromise or tamponade  CORONARY CTA performed on 09/30/2014 1. Calcium score 598.2 2. Right dominant system 3. Moderate disease in the mid RCA and moderate disease proximal to the stent in the mid LAD.  Stent in the mid LAD has no significant in-stent restenosis; treat medically. 4. Mild to moderate mitral valve regurgitation  LEFT HEART CATHETERIZATION AND CORONARY ANGIOGRAPHY performed on 12/04/2006 1. Coronary artery disease  80% stenosis of the mid LAD  70% stenosis of the RCA 2. PCI performed and a 2.75 x 23 mm Xience V DES x 1 placed to mid LAD  Impression and Plan:  Erica Larsen has been referred for pre-anesthesia review and clearance prior to her undergoing the planned anesthetic and procedural courses. Available labs, pertinent testing, and imaging results were personally reviewed by me. This patient has been appropriately cleared by cardiology with an overall LOW risk of significant perioperative cardiovascular complications.  Based on clinical review performed today (06/29/20), barring any significant acute changes in the patient's overall condition, it is anticipated that she will be able to proceed with the planned surgical intervention. Any acute changes in clinical condition may necessitate her procedure being postponed and/or cancelled. Patient will meet with anesthesia team (MD and/or CRNA) on the day of her procedure for preoperative evaluation/assessment. Questions regarding anesthetic course will be fielded at that time.   Pre-surgical instructions were  reviewed with the patient during her PAT appointment and questions were fielded by PAT clinical staff. Patient was advised that if any questions or concerns arise prior to her procedure then she should return a call to PAT and/or her surgeon's office to discuss.  Honor Loh, MSN, APRN, FNP-C, CEN [facility]  Peri-operative Services Nurse Practitioner Phone: [phone] 06/29/20 7:53 AM  NOTE: This note has been prepared using Dragon dictation software. Despite my best ability to proofread, there is always the potential that unintentional transcriptional errors may still occur from this process.

## 2020-06-29 NOTE — ED Triage Notes (Signed)
Pt comes into the ED via POV c/o chest pain that is centralized and radiates into her jaw and neck.  PT denies any new nausea, SHOB, or dizziness.  Pt ambulatory to exam room at this time with even and unlabored respirations.  Pt states she does have known CHF and afib and vascular disease.

## 2020-06-29 NOTE — ED Notes (Signed)
D/C and new RX discussed with pt, pt verbalized understanding. Pt ambulatory with steady gait on D/C. NAD noted.

## 2020-06-29 NOTE — ED Notes (Signed)
Pt presents to ED with c/o of midsternum CP that started yesterday. Pt states today the chest pain started radiating to her neck and jaw. Pt states pain on inspiration. Pt denies fevers or chills. Pt denies any new nausea or SOB.   Pt states HX of CHF but denies any new swelling noted.

## 2020-06-30 ENCOUNTER — Ambulatory Visit: Admission: RE | Admit: 2020-06-30 | Payer: Medicare Other | Source: Home / Self Care | Admitting: General Surgery

## 2020-06-30 ENCOUNTER — Encounter: Admission: RE | Payer: Self-pay | Source: Home / Self Care

## 2020-06-30 HISTORY — DX: Other ill-defined heart diseases: I51.89

## 2020-06-30 HISTORY — DX: Endocarditis, valve unspecified: I38

## 2020-06-30 HISTORY — DX: Secondary pulmonary arterial hypertension: I27.21

## 2020-06-30 HISTORY — DX: Unspecified atrial fibrillation: I48.91

## 2020-06-30 HISTORY — DX: Chronic kidney disease, stage 4 (severe): N18.4

## 2020-06-30 HISTORY — DX: Left bundle-branch block, unspecified: I44.7

## 2020-06-30 HISTORY — DX: Chronic diastolic (congestive) heart failure: I50.32

## 2020-06-30 HISTORY — DX: Unspecified osteoarthritis, unspecified site: M19.90

## 2020-06-30 HISTORY — DX: Type 2 diabetes mellitus without complications: E11.9

## 2020-06-30 SURGERY — FULGURATION, WART, ANUS
Anesthesia: General

## 2020-07-03 ENCOUNTER — Ambulatory Visit: Payer: Medicare Other

## 2020-07-04 ENCOUNTER — Ambulatory Visit: Payer: Medicare Other | Admitting: Nurse Practitioner

## 2020-07-10 ENCOUNTER — Ambulatory Visit (INDEPENDENT_AMBULATORY_CARE_PROVIDER_SITE_OTHER): Payer: Medicare Other

## 2020-07-10 VITALS — Ht 62.0 in | Wt 200.0 lb

## 2020-07-10 DIAGNOSIS — Z0000 Encounter for general adult medical examination without abnormal findings: Secondary | ICD-10-CM | POA: Diagnosis not present

## 2020-07-10 NOTE — Progress Notes (Signed)
I connected with [name] today by telephone and verified that I am speaking with the correct person using two identifiers. Location patient: home Location provider: work Persons participating in the virtual visit: [name], [name].   I discussed the limitations, risks, security and privacy concerns of performing an evaluation and management service by telephone and the availability of in person appointments. I also discussed with the patient that there may be a patient responsible charge related to this service. The patient expressed understanding and verbally consented to this telephonic visit.    Interactive audio and video telecommunications were attempted between this provider and patient, however failed, due to patient having technical difficulties OR patient did not have access to video capability.  We continued and completed visit with audio only.     Vital signs may be patient reported or missing.  Subjective:   Erica Larsen is a 70 y.o. female who presents for Medicare Annual (Subsequent) preventive examination.  Review of Systems     Cardiac Risk Factors include: advanced age (>36men, >22 women);diabetes mellitus;dyslipidemia;hypertension;obesity (BMI >30kg/m2);sedentary lifestyle     Objective:    Today's Vitals   07/10/20 1111  Weight: 200 lb (90.7 kg)  Height: 5\' 2"  (1.575 m)   Body mass index is 36.58 kg/m.  Advanced Directives 07/10/2020 06/29/2020 06/27/2020 04/20/2020 08/06/2018 12/05/2017 09/24/2016  Does Patient Have a Medical Advance Directive? No No No No No No No  Would patient like information on creating a medical advance directive? - - No - Patient declined - No - Patient declined No - Patient declined No - Patient declined    Current Medications (verified) Outpatient Encounter Medications as of 07/10/2020  Medication Sig  . amiodarone (PACERONE) 200 MG tablet Take 200 mg by mouth in the morning.  [name] atorvastatin (LIPITOR) 80 MG tablet  Take 80 mg by mouth daily after breakfast.   . Cholecalciferol (VITAMIN D3) 50 MCG (2000 UT) TABS Take 2,000 Units by mouth in the morning.  . Ferrous Sulfate (SLOW FE PO) Take 1 tablet by mouth in the morning.  . furosemide (LASIX) 40 MG tablet Take 40 mg by mouth in the morning.  [name] JANUVIA 25 MG tablet TAKE 1 TABLET(25 MG) BY MOUTH DAILY (Patient taking differently: Take 25 mg by mouth daily.)  . lansoprazole (PREVACID) 30 MG capsule TAKE ONE CAPSULE BY MOUTH DAILY AT 12:00 NOON (Patient taking differently: Take 30 mg by mouth in the morning.)  . levothyroxine (SYNTHROID) 112 MCG tablet TAKE 1 TABLET(112 MCG) BY MOUTH DAILY (Patient taking differently: Take 112 mcg by mouth daily before breakfast.)  . metoprolol succinate (TOPROL-XL) 100 MG 24 hr tablet Take 100 mg by mouth in the morning.  . olmesartan (BENICAR) 40 MG tablet Take 40 mg by mouth daily.  [name] Glycol-Propyl Glycol (SYSTANE) 0.4-0.3 % SOLN Place 1 drop into both eyes in the morning and at bedtime.  . raloxifene (EVISTA) 60 MG tablet TAKE 1 TABLET(60 MG) BY MOUTH DAILY (Patient taking differently: Take 60 mg by mouth in the morning.)  . vitamin B-12 (CYANOCOBALAMIN) 1000 MCG tablet Take 1,000 mcg by mouth in the morning.  Alveda Reasons 20 MG TABS tablet Take 20 mg by mouth in the morning.  . Imiquimod Pump 3.75 % CREA Apply a thin layer once daily (using 1 full pump) prior to bedtime; leave on skin for 8 hours, then remove with mild soap and water in morning.  Continue application nightly until complete resolution of warts -- often  8 weeks total. (Patient not taking: No sig reported)  . isosorbide mononitrate (IMDUR) 30 MG 24 hr tablet Take 1 tablet (30 mg total) by mouth daily. (Patient not taking: Reported on 07/10/2020)   No facility-administered encounter medications on file as of 07/10/2020.    Allergies (verified) Sulfa antibiotics and Latex   History: Past Medical History:  Diagnosis Date  . A-fib ([place])   . Anemia of  chronic disease 2017  . Chronic diastolic HF (heart failure) ([place])   . CKD (chronic kidney disease), stage IV ([place])   . Coronary artery disease    a.) PCI on 12/05/2006 - 80% stenosis mLAD and 70% stenosis RCA; Xience V 2.75 x 23 mm DES to mLAD   . COVID-19 virus detected 08/14/2018  . Diverticulosis   . Esophagitis   . GERD (gastroesophageal reflux disease)   . Grade III diastolic dysfunction   . Hiatal hernia   . High cholesterol   . Hypercalcemia   . Hypertension   . Hypothyroidism   . LBBB (left bundle branch block)   . Lumbar radiculopathy   . Obesity   . Osteoarthritis   . Osteopenia    Hips  . PAH (pulmonary artery hypertension) ([facility])    mild per 07/14/2018 TTE  . Pneumonia   . T2DM (type 2 diabetes mellitus) ([place])   . Valvular insufficiency    a. MODERATE PV, TV, MV regurgitation on 07/14/2018 TTE   Past Surgical History:  Procedure Laterality Date  . CATARACT EXTRACTION Bilateral    May - June 2018  . COLONOSCOPY WITH PROPOFOL N/A 08/01/2015   Procedure: COLONOSCOPY WITH PROPOFOL;  Surgeon: Lucilla Lame, MD;  Location: [facility] ENDOSCOPY;  Service: Endoscopy;  Laterality: N/A;  . CORONARY ANGIOPLASTY WITH STENT PLACEMENT  12/05/2006   80% stenosis mLAD and 70% stenosis RCA; Xience V 2.75 x 23 mm DES to mLAD   . ESOPHAGOGASTRODUODENOSCOPY (EGD) WITH PROPOFOL N/A 05/14/2016   Procedure: ESOPHAGOGASTRODUODENOSCOPY (EGD) WITH PROPOFOL;  Surgeon: Lucilla Lame, MD;  Location: [facility] ENDOSCOPY;  Service: Endoscopy;  Laterality: N/A;  . EYE SURGERY    . GIVENS CAPSULE STUDY N/A 06/01/2015   Procedure: GIVENS CAPSULE STUDY;  Surgeon: Lucilla Lame, MD;  Location: [facility] ENDOSCOPY;  Service: Endoscopy;  Laterality: N/A;  . HIATAL HERNIA REPAIR N/A 09/24/2016   Procedure: LAPAROSCOPIC REPAIR OF HIATAL HERNIA WITH FUNDOPLICATION WITH MESH (BIO-A);  Surgeon: Jules Husbands, MD;  Location: [facility] ORS;  Service: General;  Laterality: N/A;  Lithotomy position w arms tucked.   . pre cancer removed      left forearm  . squamous cell carcinoma surgery     back, forehead, left forearm   Family History  Problem Relation Age of Onset  . Heart disease Mother   . Kidney failure Mother   . Hypertension Mother   . Diabetes Mother   . Heart attack Father   . Hypertension Father   . Thyroid disease Father   . Hypertension Sister   . Diabetes Sister   . Thyroid disease Daughter   . Thyroid disease Daughter   . Breast cancer Paternal Aunt    Social History   Socioeconomic History  . Marital status: Married    Spouse name: Jenny Reichmann  . Number of children: 2  . Years of education: Not on file  . Highest education level: Not on file  Occupational History  . Occupation: retired  Tobacco Use  . Smoking status: Never Smoker  . Smokeless tobacco: Never Used  Vaping Use  .  Vaping Use: Never used  Substance and Sexual Activity  . Alcohol use: Yes    Comment: occassional  . Drug use: No  . Sexual activity: Yes    Birth control/protection: Post-menopausal  Other Topics Concern  . Not on file  Social History Narrative  . Not on file   Social Determinants of Health   Financial Resource Strain: Low Risk   . Difficulty of Paying Living Expenses: Not hard at all  Food Insecurity: No Food Insecurity  . Worried About Charity fundraiser in the Last Year: Never true  . Ran Out of Food in the Last Year: Never true  Transportation Needs: No Transportation Needs  . Lack of Transportation (Medical): No  . Lack of Transportation (Non-Medical): No  Physical Activity: Inactive  . Days of Exercise per Week: 0 days  . Minutes of Exercise per Session: 0 min  Stress: No Stress Concern Present  . Feeling of Stress : Not at all  Social Connections: Not on file    Tobacco Counseling Counseling given: Not Answered   Clinical Intake:  Pre-visit preparation completed: Yes  Pain : No/denies pain     Nutritional Status: BMI > 30  Obese Nutritional Risks: None Diabetes: Yes  How often do  you need to have someone help you when you read instructions, pamphlets, or other written materials from your doctor or pharmacy?: 1 - Never What is the last grade level you completed in school?: 12th grade  Diabetic? Yes Nutrition Risk Assessment:  Has the patient had any N/V/D within the last 2 months?  No  Does the patient have any non-healing wounds?  No  Has the patient had any unintentional weight loss or weight gain?  No   Diabetes:  Is the patient diabetic?  Yes  If diabetic, was a CBG obtained today?  No  Did the patient bring in their glucometer from home?  No  How often do you monitor your CBG's? With symptoms.   Financial Strains and Diabetes Management:  Are you having any financial strains with the device, your supplies or your medication? No .  Does the patient want to be seen by Chronic Care Management for management of their diabetes?  No  Would the patient like to be referred to a Nutritionist or for Diabetic Management?  No   Diabetic Exams:  Diabetic Eye Exam: Overdue for diabetic eye exam. Pt has been advised about the importance in completing this exam. Patient advised to call and schedule an eye exam. Diabetic Foot Exam: Completed 04/06/2020   Interpreter Needed?: No  Information entered by :: NAllen LPN   Activities of Daily Living In your present state of health, do you have any difficulty performing the following activities: 07/10/2020 06/27/2020  Hearing? N N  Vision? N N  Difficulty concentrating or making decisions? N N  Walking or climbing stairs? N N  Dressing or bathing? N N  Doing errands, shopping? N N  Preparing Food and eating ? N -  Using the Toilet? N -  In the past six months, have you accidently leaked urine? N -  Do you have problems with loss of bowel control? N -  Managing your Medications? N -  Managing your Finances? N -  Housekeeping or managing your Housekeeping? N -  Some recent data might be hidden    Patient Care  Team: Venita Lick, NP as PCP - General (Nurse Practitioner) Dionisio David, MD as Consulting Physician (Cardiology) Charlaine Dalton  R, MD as Consulting Physician (Internal Medicine)  Indicate any recent Medical Services you may have received from other than [facility] providers in the past year (date may be approximate).     Assessment:   This is a routine wellness examination for Anne-Marie.  Hearing/Vision screen  Hearing Screening   125Hz  250Hz  500Hz  1000Hz  2000Hz  3000Hz  4000Hz  6000Hz  8000Hz   Right ear:           Left ear:           Vision Screening Comments: No regular eye exams  Dietary issues and exercise activities discussed: Current Exercise Habits: The patient does not participate in regular exercise at present  Goals Addressed            This Visit's Progress   . Patient Stated       07/10/2020, no goals      Depression Screen PHQ 2/9 Scores 07/10/2020 04/06/2020 06/30/2019 03/22/2019 10/02/2017 05/31/2016 05/10/2015  PHQ - 2 Score 0 0 0 0 0 0 0  PHQ- 9 Score - 0 - - 0 3 -    Fall Risk Fall Risk  07/10/2020 04/06/2020 06/30/2019 03/22/2019 05/31/2016  Falls in the past year? 0 0 0 0 No  Number falls in past yr: - - 0 0 -  Injury with Fall? - - 0 0 -  Risk for fall due to : Medication side effect - - - -  Follow up Falls evaluation completed;Education provided;Falls prevention discussed - - Falls evaluation completed -    FALL RISK PREVENTION PERTAINING TO THE HOME:  Any stairs in or around the home? Yes  If so, are there any without handrails? No  Home free of loose throw rugs in walkways, pet beds, electrical cords, etc? Yes  Adequate lighting in your home to reduce risk of falls? Yes   ASSISTIVE DEVICES UTILIZED TO PREVENT FALLS:  Life alert? No  Use of a cane, walker or w/c? No  Grab bars in the bathroom? No  Shower chair or bench in shower? No  Elevated toilet seat or a handicapped toilet? No   TIMED UP AND GO:  Was the test performed? No .    Cognitive  Function:     6CIT Screen 07/10/2020  What Year? 0 points  What month? 0 points  What time? 0 points  Count back from 20 0 points  Months in reverse 0 points  Repeat phrase 0 points  Total Score 0    Immunizations Immunization History  Administered Date(s) Administered  . Fluad Quad(high Dose 65+) 01/05/2020  . Influenza, High Dose Seasonal PF 12/19/2015, 12/10/2016, 02/08/2018  . Influenza,inj,Quad PF,6+ Mos 11/25/2014  . Influenza-Unspecified 11/29/2013, 12/10/2016  . PFIZER(Purple Top)SARS-COV-2 Vaccination 05/11/2019, 06/08/2019, 04/18/2020  . Pneumococcal Conjugate-13 03/02/2014  . Pneumococcal Polysaccharide-23 01/18/2009, 03/08/2015  . Tdap 09/20/2011  . Zoster 11/29/2013    TDAP status: Up to date  Flu Vaccine status: Up to date  Pneumococcal vaccine status: Up to date  Covid-19 vaccine status: Completed vaccines  Qualifies for Shingles Vaccine? Yes   Zostavax completed Yes   Shingrix Completed?: No.    Education has been provided regarding the importance of this vaccine. Patient has been advised to call insurance company to determine out of pocket expense if they have not yet received this vaccine. Advised may also receive vaccine at local pharmacy or Health Dept. Verbalized acceptance and understanding.  Screening Tests Health Maintenance  Topic Date Due  . OPHTHALMOLOGY EXAM  04/15/2017  . COLONOSCOPY (Pts 45-63yrs  Insurance coverage will need to be confirmed)  07/31/2020  . INFLUENZA VACCINE  09/18/2020  . HEMOGLOBIN A1C  10/04/2020  . FOOT EXAM  04/06/2021  . TETANUS/TDAP  09/19/2021  . MAMMOGRAM  05/16/2022  . DEXA SCAN  Completed  . COVID-19 Vaccine  Completed  . Hepatitis C Screening  Completed  . PNA vac Low Risk Adult  Completed  . HPV VACCINES  Aged Out    Health Maintenance  Health Maintenance Due  Topic Date Due  . OPHTHALMOLOGY EXAM  04/15/2017    Colorectal cancer screening: Type of screening: Colonoscopy. Completed 08/01/2015.  Repeat every 5 years  Mammogram status: Completed 05/15/2020. Repeat every year  Bone Density status: Completed 05/15/2020.   Lung Cancer Screening: (Low Dose CT Chest recommended if Age 72-80 years, 30 pack-year currently smoking OR have quit w/in 15years.) does not qualify.   Lung Cancer Screening Referral: no  Additional Screening:  Hepatitis C Screening: does qualify; Completed 09/18/2015   Vision Screening: Recommended annual ophthalmology exams for early detection of glaucoma and other disorders of the eye. Is the patient up to date with their annual eye exam?  No  Who is the provider or what is the name of the office in which the patient attends annual eye exams? Dr. Gloriann Loan If pt is not established with a provider, would they like to be referred to a provider to establish care? No .   Dental Screening: Recommended annual dental exams for proper oral hygiene  Community Resource Referral / Chronic Care Management: CRR required this visit?  No   CCM required this visit?  No      Plan:     I have personally reviewed and noted the following in the patient's chart:   . Medical and social history . Use of alcohol, tobacco or illicit drugs  . Current medications and supplements including opioid prescriptions.  . Functional ability and status . Nutritional status . Physical activity . Advanced directives . List of other physicians . Hospitalizations, surgeries, and ER visits in previous 12 months . Vitals . Screenings to include cognitive, depression, and falls . Referrals and appointments  In addition, I have reviewed and discussed with patient certain preventive protocols, quality metrics, and best practice recommendations. A written personalized care plan for preventive services as well as general preventive health recommendations were provided to patient.     Kellie Simmering, LPN   10/17/5619   Nurse Notes:

## 2020-07-10 NOTE — Patient Instructions (Signed)
Erica Larsen , Thank you for taking time to come for your Medicare Wellness Visit. I appreciate your ongoing commitment to your health goals. Please review the following plan we discussed and let me know if I can assist you in the future.   Screening recommendations/referrals: Colonoscopy: completed 08/01/2015, due 07/31/2020 Mammogram: completed 05/15/2020 Bone Density: completed 05/15/2020 Recommended yearly ophthalmology/optometry visit for glaucoma screening and checkup Recommended yearly dental visit for hygiene and checkup  Vaccinations: Influenza vaccine: completed 01/05/2020, due 09/18/2020 Pneumococcal vaccine: completed 03/08/2015 Tdap vaccine: completed 09/20/2011, due 09/19/2021 Shingles vaccine: discussed   Covid-19: 04/18/2020, 06/08/2019, 05/11/2019  Advanced directives: Advance directive discussed with you today.   Conditions/risks identified: none  Next appointment: Follow up in one year for your annual wellness visit    Preventive Care 65 Years and Older, Female Preventive care refers to lifestyle choices and visits with your health care provider that can promote health and wellness. What does preventive care include?  A yearly physical exam. This is also called an annual well check.  Dental exams once or twice a year.  Routine eye exams. Ask your health care provider how often you should have your eyes checked.  Personal lifestyle choices, including:  Daily care of your teeth and gums.  Regular physical activity.  Eating a healthy diet.  Avoiding tobacco and drug use.  Limiting alcohol use.  Practicing safe sex.  Taking low-dose aspirin every day.  Taking vitamin and mineral supplements as recommended by your health care provider. What happens during an annual well check? The services and screenings done by your health care provider during your annual well check will depend on your age, overall health, lifestyle risk factors, and family history of  disease. Counseling  Your health care provider may ask you questions about your:  Alcohol use.  Tobacco use.  Drug use.  Emotional well-being.  Home and relationship well-being.  Sexual activity.  Eating habits.  History of falls.  Memory and ability to understand (cognition).  Work and work Statistician.  Reproductive health. Screening  You may have the following tests or measurements:  Height, weight, and BMI.  Blood pressure.  Lipid and cholesterol levels. These may be checked every 5 years, or more frequently if you are over 29 years old.  Skin check.  Lung cancer screening. You may have this screening every year starting at age 72 if you have a 30-pack-year history of smoking and currently smoke or have quit within the past 15 years.  Fecal occult blood test (FOBT) of the stool. You may have this test every year starting at age 56.  Flexible sigmoidoscopy or colonoscopy. You may have a sigmoidoscopy every 5 years or a colonoscopy every 10 years starting at age 75.  Hepatitis C blood test.  Hepatitis B blood test.  Sexually transmitted disease (STD) testing.  Diabetes screening. This is done by checking your blood sugar (glucose) after you have not eaten for a while (fasting). You may have this done every 1-3 years.  Bone density scan. This is done to screen for osteoporosis. You may have this done starting at age 64.  Mammogram. This may be done every 1-2 years. Talk to your health care provider about how often you should have regular mammograms. Talk with your health care provider about your test results, treatment options, and if necessary, the need for more tests. Vaccines  Your health care provider may recommend certain vaccines, such as:  Influenza vaccine. This is recommended every year.  Tetanus, diphtheria,  and acellular pertussis (Tdap, Td) vaccine. You may need a Td booster every 10 years.  Zoster vaccine. You may need this after age  72.  Pneumococcal 13-valent conjugate (PCV13) vaccine. One dose is recommended after age 48.  Pneumococcal polysaccharide (PPSV23) vaccine. One dose is recommended after age 49. Talk to your health care provider about which screenings and vaccines you need and how often you need them. This information is not intended to replace advice given to you by your health care provider. Make sure you discuss any questions you have with your health care provider. Document Released: 03/03/2015 Document Revised: 10/25/2015 Document Reviewed: 12/06/2014 Elsevier Interactive Patient Education  2017 [employer] Prevention in the Home Falls can cause injuries. They can happen to people of all ages. There are many things you can do to make your home safe and to help prevent falls. What can I do on the outside of my home?  Regularly fix the edges of walkways and driveways and fix any cracks.  Remove anything that might make you trip as you walk through a door, such as a raised step or threshold.  Trim any bushes or trees on the path to your home.  Use bright outdoor lighting.  Clear any walking paths of anything that might make someone trip, such as rocks or tools.  Regularly check to see if handrails are loose or broken. Make sure that both sides of any steps have handrails.  Any raised decks and porches should have guardrails on the edges.  Have any leaves, snow, or ice cleared regularly.  Use sand or salt on walking paths during winter.  Clean up any spills in your garage right away. This includes oil or grease spills. What can I do in the bathroom?  Use night lights.  Install grab bars by the toilet and in the tub and shower. Do not use towel bars as grab bars.  Use non-skid mats or decals in the tub or shower.  If you need to sit down in the shower, use a plastic, non-slip stool.  Keep the floor dry. Clean up any water that spills on the floor as soon as it happens.  Remove  soap buildup in the tub or shower regularly.  Attach bath mats securely with double-sided non-slip rug tape.  Do not have throw rugs and other things on the floor that can make you trip. What can I do in the bedroom?  Use night lights.  Make sure that you have a light by your bed that is easy to reach.  Do not use any sheets or blankets that are too big for your bed. They should not hang down onto the floor.  Have a firm chair that has side arms. You can use this for support while you get dressed.  Do not have throw rugs and other things on the floor that can make you trip. What can I do in the kitchen?  Clean up any spills right away.  Avoid walking on wet floors.  Keep items that you use a lot in easy-to-reach places.  If you need to reach something above you, use a strong step stool that has a grab bar.  Keep electrical cords out of the way.  Do not use floor polish or wax that makes floors slippery. If you must use wax, use non-skid floor wax.  Do not have throw rugs and other things on the floor that can make you trip. What can I do with my  stairs?  Do not leave any items on the stairs.  Make sure that there are handrails on both sides of the stairs and use them. Fix handrails that are broken or loose. Make sure that handrails are as long as the stairways.  Check any carpeting to make sure that it is firmly attached to the stairs. Fix any carpet that is loose or worn.  Avoid having throw rugs at the top or bottom of the stairs. If you do have throw rugs, attach them to the floor with carpet tape.  Make sure that you have a light switch at the top of the stairs and the bottom of the stairs. If you do not have them, ask someone to add them for you. What else can I do to help prevent falls?  Wear shoes that:  Do not have high heels.  Have rubber bottoms.  Are comfortable and fit you well.  Are closed at the toe. Do not wear sandals.  If you use a  stepladder:  Make sure that it is fully opened. Do not climb a closed stepladder.  Make sure that both sides of the stepladder are locked into place.  Ask someone to hold it for you, if possible.  Clearly mark and make sure that you can see:  Any grab bars or handrails.  First and last steps.  Where the edge of each step is.  Use tools that help you move around (mobility aids) if they are needed. These include:  Canes.  Walkers.  Scooters.  Crutches.  Turn on the lights when you go into a dark area. Replace any light bulbs as soon as they burn out.  Set up your furniture so you have a clear path. Avoid moving your furniture around.  If any of your floors are uneven, fix them.  If there are any pets around you, be aware of where they are.  Review your medicines with your doctor. Some medicines can make you feel dizzy. This can increase your chance of falling. Ask your doctor what other things that you can do to help prevent falls. This information is not intended to replace advice given to you by your health care provider. Make sure you discuss any questions you have with your health care provider. Document Released: 12/01/2008 Document Revised: 07/13/2015 Document Reviewed: 03/11/2014 Elsevier Interactive Patient Education  2017 [employer].

## 2020-07-12 ENCOUNTER — Other Ambulatory Visit: Payer: Self-pay | Admitting: Nurse Practitioner

## 2020-07-12 DIAGNOSIS — M810 Age-related osteoporosis without current pathological fracture: Secondary | ICD-10-CM

## 2020-07-25 DIAGNOSIS — I10 Essential (primary) hypertension: Secondary | ICD-10-CM | POA: Diagnosis not present

## 2020-07-25 DIAGNOSIS — R0602 Shortness of breath: Secondary | ICD-10-CM | POA: Diagnosis not present

## 2020-07-25 DIAGNOSIS — R079 Chest pain, unspecified: Secondary | ICD-10-CM | POA: Diagnosis not present

## 2020-07-25 DIAGNOSIS — I2510 Atherosclerotic heart disease of native coronary artery without angina pectoris: Secondary | ICD-10-CM | POA: Diagnosis not present

## 2020-07-25 DIAGNOSIS — I340 Nonrheumatic mitral (valve) insufficiency: Secondary | ICD-10-CM | POA: Diagnosis not present

## 2020-07-28 ENCOUNTER — Telehealth: Payer: Self-pay | Admitting: Internal Medicine

## 2020-08-02 ENCOUNTER — Inpatient Hospital Stay: Payer: Medicare Other

## 2020-08-02 DIAGNOSIS — R079 Chest pain, unspecified: Secondary | ICD-10-CM | POA: Diagnosis not present

## 2020-08-04 ENCOUNTER — Inpatient Hospital Stay: Payer: Medicare Other

## 2020-08-04 ENCOUNTER — Inpatient Hospital Stay: Payer: Medicare Other | Admitting: Internal Medicine

## 2020-08-07 ENCOUNTER — Other Ambulatory Visit: Payer: Self-pay | Admitting: Nurse Practitioner

## 2020-08-07 DIAGNOSIS — R079 Chest pain, unspecified: Secondary | ICD-10-CM | POA: Diagnosis not present

## 2020-08-10 ENCOUNTER — Encounter: Payer: Self-pay | Admitting: Internal Medicine

## 2020-08-10 DIAGNOSIS — E782 Mixed hyperlipidemia: Secondary | ICD-10-CM | POA: Diagnosis not present

## 2020-08-10 DIAGNOSIS — I10 Essential (primary) hypertension: Secondary | ICD-10-CM | POA: Diagnosis not present

## 2020-08-10 DIAGNOSIS — I2510 Atherosclerotic heart disease of native coronary artery without angina pectoris: Secondary | ICD-10-CM | POA: Diagnosis not present

## 2020-08-10 DIAGNOSIS — R0602 Shortness of breath: Secondary | ICD-10-CM | POA: Diagnosis not present

## 2020-08-14 ENCOUNTER — Inpatient Hospital Stay: Payer: Medicare Other | Attending: Internal Medicine

## 2020-08-14 DIAGNOSIS — Z8616 Personal history of COVID-19: Secondary | ICD-10-CM | POA: Insufficient documentation

## 2020-08-14 DIAGNOSIS — R5383 Other fatigue: Secondary | ICD-10-CM | POA: Diagnosis not present

## 2020-08-14 DIAGNOSIS — E039 Hypothyroidism, unspecified: Secondary | ICD-10-CM | POA: Diagnosis not present

## 2020-08-14 DIAGNOSIS — Z841 Family history of disorders of kidney and ureter: Secondary | ICD-10-CM | POA: Diagnosis not present

## 2020-08-14 DIAGNOSIS — I5032 Chronic diastolic (congestive) heart failure: Secondary | ICD-10-CM | POA: Insufficient documentation

## 2020-08-14 DIAGNOSIS — Z882 Allergy status to sulfonamides status: Secondary | ICD-10-CM | POA: Diagnosis not present

## 2020-08-14 DIAGNOSIS — Z803 Family history of malignant neoplasm of breast: Secondary | ICD-10-CM | POA: Insufficient documentation

## 2020-08-14 DIAGNOSIS — D631 Anemia in chronic kidney disease: Secondary | ICD-10-CM

## 2020-08-14 DIAGNOSIS — Z7901 Long term (current) use of anticoagulants: Secondary | ICD-10-CM | POA: Diagnosis not present

## 2020-08-14 DIAGNOSIS — M8580 Other specified disorders of bone density and structure, unspecified site: Secondary | ICD-10-CM | POA: Insufficient documentation

## 2020-08-14 DIAGNOSIS — M549 Dorsalgia, unspecified: Secondary | ICD-10-CM | POA: Insufficient documentation

## 2020-08-14 DIAGNOSIS — D696 Thrombocytopenia, unspecified: Secondary | ICD-10-CM | POA: Insufficient documentation

## 2020-08-14 DIAGNOSIS — I4891 Unspecified atrial fibrillation: Secondary | ICD-10-CM | POA: Diagnosis not present

## 2020-08-14 DIAGNOSIS — N184 Chronic kidney disease, stage 4 (severe): Secondary | ICD-10-CM | POA: Insufficient documentation

## 2020-08-14 DIAGNOSIS — I130 Hypertensive heart and chronic kidney disease with heart failure and stage 1 through stage 4 chronic kidney disease, or unspecified chronic kidney disease: Secondary | ICD-10-CM | POA: Insufficient documentation

## 2020-08-14 DIAGNOSIS — D508 Other iron deficiency anemias: Secondary | ICD-10-CM | POA: Insufficient documentation

## 2020-08-14 DIAGNOSIS — M2550 Pain in unspecified joint: Secondary | ICD-10-CM | POA: Diagnosis not present

## 2020-08-14 DIAGNOSIS — Z833 Family history of diabetes mellitus: Secondary | ICD-10-CM | POA: Insufficient documentation

## 2020-08-14 DIAGNOSIS — Z8349 Family history of other endocrine, nutritional and metabolic diseases: Secondary | ICD-10-CM | POA: Diagnosis not present

## 2020-08-14 DIAGNOSIS — Z79899 Other long term (current) drug therapy: Secondary | ICD-10-CM | POA: Insufficient documentation

## 2020-08-14 DIAGNOSIS — Z8249 Family history of ischemic heart disease and other diseases of the circulatory system: Secondary | ICD-10-CM | POA: Insufficient documentation

## 2020-08-14 DIAGNOSIS — E1122 Type 2 diabetes mellitus with diabetic chronic kidney disease: Secondary | ICD-10-CM | POA: Diagnosis not present

## 2020-08-14 DIAGNOSIS — D510 Vitamin B12 deficiency anemia due to intrinsic factor deficiency: Secondary | ICD-10-CM

## 2020-08-14 DIAGNOSIS — E538 Deficiency of other specified B group vitamins: Secondary | ICD-10-CM | POA: Insufficient documentation

## 2020-08-14 LAB — IRON AND TIBC
Iron: 108 ug/dL (ref 28–170)
Saturation Ratios: 25 % (ref 10.4–31.8)
TIBC: 435 ug/dL (ref 250–450)
UIBC: 327 ug/dL

## 2020-08-14 LAB — CBC WITH DIFFERENTIAL/PLATELET
Abs Immature Granulocytes: 0.02 10*3/uL (ref 0.00–0.07)
Basophils Absolute: 0 10*3/uL (ref 0.0–0.1)
Basophils Relative: 0 %
Eosinophils Absolute: 0.1 10*3/uL (ref 0.0–0.5)
Eosinophils Relative: 2 %
HCT: 39.1 % (ref 36.0–46.0)
Hemoglobin: 12.9 g/dL (ref 12.0–15.0)
Immature Granulocytes: 0 %
Lymphocytes Relative: 18 %
Lymphs Abs: 1.4 10*3/uL (ref 0.7–4.0)
MCH: 33.7 pg (ref 26.0–34.0)
MCHC: 33 g/dL (ref 30.0–36.0)
MCV: 102.1 fL — ABNORMAL HIGH (ref 80.0–100.0)
Monocytes Absolute: 0.7 10*3/uL (ref 0.1–1.0)
Monocytes Relative: 9 %
Neutro Abs: 5.8 10*3/uL (ref 1.7–7.7)
Neutrophils Relative %: 71 %
Platelets: 129 10*3/uL — ABNORMAL LOW (ref 150–400)
RBC: 3.83 MIL/uL — ABNORMAL LOW (ref 3.87–5.11)
RDW: 13.7 % (ref 11.5–15.5)
WBC: 8.1 10*3/uL (ref 4.0–10.5)
nRBC: 0 % (ref 0.0–0.2)

## 2020-08-14 LAB — FERRITIN: Ferritin: 132 ng/mL (ref 11–307)

## 2020-08-14 LAB — COMPREHENSIVE METABOLIC PANEL
ALT: 36 U/L (ref 0–44)
AST: 54 U/L — ABNORMAL HIGH (ref 15–41)
Albumin: 4.1 g/dL (ref 3.5–5.0)
Alkaline Phosphatase: 80 U/L (ref 38–126)
Anion gap: 8 (ref 5–15)
BUN: 21 mg/dL (ref 8–23)
CO2: 28 mmol/L (ref 22–32)
Calcium: 8.9 mg/dL (ref 8.9–10.3)
Chloride: 100 mmol/L (ref 98–111)
Creatinine, Ser: 1.47 mg/dL — ABNORMAL HIGH (ref 0.44–1.00)
GFR, Estimated: 38 mL/min — ABNORMAL LOW (ref >60)
Glucose, Bld: 131 mg/dL — ABNORMAL HIGH (ref 70–99)
Potassium: 3.9 mmol/L (ref 3.5–5.1)
Sodium: 136 mmol/L (ref 135–145)
Total Bilirubin: 0.7 mg/dL (ref 0.3–1.2)
Total Protein: 7.7 g/dL (ref 6.5–8.1)

## 2020-08-14 LAB — VITAMIN B12: Vitamin B-12: 876 pg/mL (ref 180–914)

## 2020-08-16 ENCOUNTER — Other Ambulatory Visit: Payer: Self-pay

## 2020-08-16 ENCOUNTER — Inpatient Hospital Stay: Payer: Medicare Other

## 2020-08-16 ENCOUNTER — Inpatient Hospital Stay (HOSPITAL_BASED_OUTPATIENT_CLINIC_OR_DEPARTMENT_OTHER): Payer: Medicare Other | Admitting: Internal Medicine

## 2020-08-16 ENCOUNTER — Encounter: Payer: Self-pay | Admitting: Internal Medicine

## 2020-08-16 DIAGNOSIS — Z7901 Long term (current) use of anticoagulants: Secondary | ICD-10-CM | POA: Diagnosis not present

## 2020-08-16 DIAGNOSIS — N184 Chronic kidney disease, stage 4 (severe): Secondary | ICD-10-CM | POA: Diagnosis not present

## 2020-08-16 DIAGNOSIS — D500 Iron deficiency anemia secondary to blood loss (chronic): Secondary | ICD-10-CM | POA: Diagnosis not present

## 2020-08-16 DIAGNOSIS — E1122 Type 2 diabetes mellitus with diabetic chronic kidney disease: Secondary | ICD-10-CM | POA: Diagnosis not present

## 2020-08-16 DIAGNOSIS — M549 Dorsalgia, unspecified: Secondary | ICD-10-CM | POA: Diagnosis not present

## 2020-08-16 DIAGNOSIS — Z841 Family history of disorders of kidney and ureter: Secondary | ICD-10-CM | POA: Diagnosis not present

## 2020-08-16 DIAGNOSIS — R5383 Other fatigue: Secondary | ICD-10-CM | POA: Diagnosis not present

## 2020-08-16 DIAGNOSIS — D508 Other iron deficiency anemias: Secondary | ICD-10-CM | POA: Diagnosis not present

## 2020-08-16 DIAGNOSIS — E039 Hypothyroidism, unspecified: Secondary | ICD-10-CM | POA: Diagnosis not present

## 2020-08-16 DIAGNOSIS — I5032 Chronic diastolic (congestive) heart failure: Secondary | ICD-10-CM | POA: Diagnosis not present

## 2020-08-16 DIAGNOSIS — Z882 Allergy status to sulfonamides status: Secondary | ICD-10-CM | POA: Diagnosis not present

## 2020-08-16 DIAGNOSIS — Z8249 Family history of ischemic heart disease and other diseases of the circulatory system: Secondary | ICD-10-CM | POA: Diagnosis not present

## 2020-08-16 DIAGNOSIS — Z8349 Family history of other endocrine, nutritional and metabolic diseases: Secondary | ICD-10-CM | POA: Diagnosis not present

## 2020-08-16 DIAGNOSIS — I130 Hypertensive heart and chronic kidney disease with heart failure and stage 1 through stage 4 chronic kidney disease, or unspecified chronic kidney disease: Secondary | ICD-10-CM | POA: Diagnosis not present

## 2020-08-16 DIAGNOSIS — Z803 Family history of malignant neoplasm of breast: Secondary | ICD-10-CM | POA: Diagnosis not present

## 2020-08-16 DIAGNOSIS — M2550 Pain in unspecified joint: Secondary | ICD-10-CM | POA: Diagnosis not present

## 2020-08-16 DIAGNOSIS — Z833 Family history of diabetes mellitus: Secondary | ICD-10-CM | POA: Diagnosis not present

## 2020-08-16 DIAGNOSIS — M8580 Other specified disorders of bone density and structure, unspecified site: Secondary | ICD-10-CM | POA: Diagnosis not present

## 2020-08-16 DIAGNOSIS — Z8616 Personal history of COVID-19: Secondary | ICD-10-CM | POA: Diagnosis not present

## 2020-08-16 DIAGNOSIS — Z79899 Other long term (current) drug therapy: Secondary | ICD-10-CM | POA: Diagnosis not present

## 2020-08-16 DIAGNOSIS — E538 Deficiency of other specified B group vitamins: Secondary | ICD-10-CM | POA: Diagnosis not present

## 2020-08-16 DIAGNOSIS — I4891 Unspecified atrial fibrillation: Secondary | ICD-10-CM | POA: Diagnosis not present

## 2020-08-16 DIAGNOSIS — D696 Thrombocytopenia, unspecified: Secondary | ICD-10-CM | POA: Diagnosis not present

## 2020-08-16 NOTE — Progress Notes (Signed)
[facility] NOTE  Patient Care Team: Venita Lick, NP as PCP - General (Nurse Practitioner) Dionisio David, MD as Consulting Physician (Cardiology) Cammie Sickle, MD as Consulting Physician (Internal Medicine)  CHIEF COMPLAINTS/PURPOSE OF CONSULTATION:   # 2014- Iron def Anemia-  [Sep 2014 EGD/COLO- NEG/ferritin 6; Dr.Wohl]. 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   # Hx CHF/CAD- asprin-plavix; Sep 2018- s/p hiatal hernia-fundoplication [Dr.Pabon]  HISTORY OF PRESENTING ILLNESS:  Erica Larsen 70 y.o.  female patient with above history of iron deficiency anemia-secondary to CKD CHF on intermittent Feraheme is here for follow-up.  Patient denies any blood in stools or black or stools.  Denies any nausea vomiting abdominal pain.  No chest pain.  Denies any swelling in the legs.  .Review of Systems  Constitutional:  Positive for malaise/fatigue. Negative for chills, diaphoresis, fever and weight loss.  HENT:  Negative for nosebleeds and sore throat.   Eyes:  Negative for double vision.  Respiratory:  Negative for cough, hemoptysis, sputum production, shortness of breath and wheezing.   Cardiovascular:  Negative for chest pain, palpitations, orthopnea and leg swelling.  Gastrointestinal:  Negative for abdominal pain, blood in stool, constipation, diarrhea, heartburn, melena, nausea and vomiting.  Genitourinary:  Negative for dysuria, frequency and urgency.  Musculoskeletal:  Positive for back pain and joint pain.  Skin: Negative.  Negative for itching and rash.  Neurological:  Negative for dizziness, tingling, focal weakness, weakness and headaches.  Endo/Heme/Allergies:  Does not bruise/bleed easily.  Psychiatric/Behavioral:  Negative for depression. The patient is not nervous/anxious and does not have insomnia.     MEDICAL HISTORY:   Past Medical History:  Diagnosis Date   A-fib [facility])    Anemia of chronic disease 2017   Chronic diastolic HF (heart failure) (HCC)    CKD (chronic kidney disease), stage IV (HCC)    Coronary artery disease    a.) PCI on 12/05/2006 - 80% stenosis mLAD and 70% stenosis RCA; Xience V 2.75 x 23 mm DES to mLAD    COVID-19 virus detected 08/14/2018   Diverticulosis    Esophagitis    GERD (gastroesophageal reflux disease)    Grade III diastolic dysfunction    Hiatal hernia    High cholesterol    Hypercalcemia    Hypertension    Hypothyroidism    LBBB (left bundle branch block)    Lumbar radiculopathy    Obesity    Osteoarthritis    Osteopenia    Hips   PAH (pulmonary artery hypertension) (HCC)    mild per 07/14/2018 TTE   Pneumonia    T2DM (type 2 diabetes mellitus) ([place])    Valvular insufficiency    a. MODERATE PV, TV, MV regurgitation on 07/14/2018 TTE    SURGICAL HISTORY: Past Surgical History:  Procedure Laterality Date   CATARACT EXTRACTION Bilateral    May - June 2018   COLONOSCOPY WITH PROPOFOL N/A 08/01/2015   Procedure: COLONOSCOPY WITH PROPOFOL;  Surgeon: Lucilla Lame, MD;  Location[facility] ENDOSCOPY;  Service: Endoscopy;  Laterality: N/A;   CORONARY ANGIOPLASTY WITH STENT PLACEMENT  12/05/2006   80% stenosis mLAD and 70% stenosis RCA; Xience V 2.75 x 23 mm DES to mLAD    ESOPHAGOGASTRODUODENOSCOPY (EGD) WITH PROPOFOL N/A 05/14/2016   Procedure: ESOPHAGOGASTRODUODENOSCOPY (EGD) WITH PROPOFOL;  Surgeon: Lucilla Lame, MD;  Location: [facility] ENDOSCOPY;  Service: Endoscopy;  Laterality: N/A;   EYE SURGERY  GIVENS CAPSULE STUDY N/A 06/01/2015   Procedure: GIVENS CAPSULE STUDY;  Surgeon: Lucilla Lame, MD;  Location: [facility] ENDOSCOPY;  Service: Endoscopy;  Laterality: N/A;   HIATAL HERNIA REPAIR N/A 09/24/2016   Procedure: LAPAROSCOPIC REPAIR OF HIATAL HERNIA WITH FUNDOPLICATION WITH MESH (BIO-A);  Surgeon: Jules Husbands, MD;  Location: [facility] ORS;  Service: General;  Laterality: N/A;   Lithotomy position w arms tucked.    pre cancer removed     left forearm   squamous cell carcinoma surgery     back, forehead, left forearm    SOCIAL HISTORY: Social History   Socioeconomic History   Marital status: Married    Spouse name: [name]   Number of children: 2   Years of education: Not on file   Highest education level: Not on file  Occupational History   Occupation: retired  Tobacco Use   Smoking status: Never   Smokeless tobacco: Never  Vaping Use   Vaping Use: Never used  Substance and Sexual Activity   Alcohol use: Yes    Comment: occassional   Drug use: No   Sexual activity: Yes    Birth control/protection: Post-menopausal  Other Topics Concern   Not on file  Social History Narrative   Not on file   Social Determinants of Health   Financial Resource Strain: Low Risk    Difficulty of Paying Living Expenses: Not hard at all  Food Insecurity: No Food Insecurity   Worried About Charity fundraiser in the Last Year: Never true   [place] in the Last Year: Never true  Transportation Needs: No Transportation Needs   Lack of Transportation (Medical): No   Lack of Transportation (Non-Medical): No  Physical Activity: Inactive   Days of Exercise per Week: 0 days   Minutes of Exercise per Session: 0 min  Stress: No Stress Concern Present   Feeling of Stress : Not at all  Social Connections: Not on file  Intimate Partner Violence: Not on file    FAMILY HISTORY: Family History  Problem Relation Age of Onset   Heart disease Mother    Kidney failure Mother    Hypertension Mother    Diabetes Mother    Heart attack Father    Hypertension Father    Thyroid disease Father    Hypertension Sister    Diabetes Sister    Thyroid disease Daughter    Thyroid disease Daughter    Breast cancer Paternal Aunt     ALLERGIES:  is allergic to sulfa antibiotics and latex.  MEDICATIONS:  Current Outpatient Medications  Medication Sig Dispense Refill    amiodarone (PACERONE) 200 MG tablet Take 200 mg by mouth in the morning.     atorvastatin (LIPITOR) 80 MG tablet Take 80 mg by mouth daily after breakfast.   0   Cholecalciferol (VITAMIN D3) 50 MCG (2000 UT) TABS Take 2,000 Units by mouth in the morning.     Ferrous Sulfate (SLOW FE PO) Take 1 tablet by mouth in the morning.     furosemide (LASIX) 40 MG tablet Take 40 mg by mouth in the morning.     JANUVIA 25 MG tablet TAKE 1 TABLET(25 MG) BY MOUTH DAILY 90 tablet 0   metoprolol succinate (TOPROL-XL) 100 MG 24 hr tablet Take 100 mg by mouth in the morning.     olmesartan (BENICAR) 40 MG tablet Take 40 mg by mouth daily.     Polyethyl Glycol-Propyl Glycol (SYSTANE) 0.4-0.3 % SOLN Place 1  drop into both eyes in the morning and at bedtime.     raloxifene (EVISTA) 60 MG tablet TAKE 1 TABLET(60 MG) BY MOUTH DAILY 90 tablet 0   vitamin B-12 (CYANOCOBALAMIN) 1000 MCG tablet Take 1,000 mcg by mouth in the morning.     XARELTO 20 MG TABS tablet Take 20 mg by mouth in the morning.     Imiquimod Pump 3.75 % CREA Apply a thin layer once daily (using 1 full pump) prior to bedtime; leave on skin for 8 hours, then remove with mild soap and water in morning.  Continue application nightly until complete resolution of warts -- often 8 weeks total. (Patient not taking: No sig reported) 7.5 g 1   isosorbide mononitrate (IMDUR) 30 MG 24 hr tablet Take 1 tablet (30 mg total) by mouth daily. (Patient not taking: No sig reported) 30 tablet 0   lansoprazole (PREVACID) 30 MG capsule TAKE ONE CAPSULE BY MOUTH DAILY AT 12:00 NOON (Patient not taking: Reported on 08/16/2020) 90 capsule 3   levothyroxine (SYNTHROID) 112 MCG tablet TAKE 1 TABLET(112 MCG) BY MOUTH DAILY (Patient not taking: Reported on 08/16/2020) 90 tablet 4   No current facility-administered medications for this visit.      [name]  PHYSICAL EXAMINATION: ECOG PERFORMANCE STATUS: 1 - Symptomatic but completely ambulatory  Vitals:   08/16/20 1100  BP: (!)  126/56  Pulse: (!) 50  Resp: 18  Temp: 98 F (36.7 C)  SpO2: 95%   Filed Weights   08/16/20 1056  Weight: 202 lb 9.6 oz (91.9 kg)    Physical Exam HENT:     Head: Normocephalic and atraumatic.     Mouth/Throat:     Pharynx: No oropharyngeal exudate.  Eyes:     Pupils: Pupils are equal, round, and reactive to light.  Cardiovascular:     Rate and Rhythm: Normal rate and regular rhythm.  Pulmonary:     Effort: Pulmonary effort is normal. No respiratory distress.     Breath sounds: Normal breath sounds. No wheezing.  Abdominal:     General: Bowel sounds are normal. There is no distension.     Palpations: Abdomen is soft. There is no mass.     Tenderness: There is no abdominal tenderness. There is no guarding or rebound.  Musculoskeletal:        General: No tenderness. Normal range of motion.     Cervical back: Normal range of motion and neck supple.  Skin:    General: Skin is warm.  Neurological:     Mental Status: She is alert and oriented to person, place, and time.  Psychiatric:        Mood and Affect: Affect normal.     LABORATORY DATA:  I have reviewed the data as listed Lab Results  Component Value Date   WBC 8.1 08/14/2020   HGB 12.9 08/14/2020   HCT 39.1 08/14/2020   MCV 102.1 (H) 08/14/2020   PLT 129 (L) 08/14/2020   Recent Labs    10/05/19 1101 02/02/20 1411 06/29/20 1006 08/14/20 1354  NA 136 139 137 136  K 4.9 4.5 4.2 3.9  CL 97 99 100 100  CO2 26 30 26 28   GLUCOSE 115* 120* 152* 131*  BUN 67* 18 23 21   CREATININE 3.07* 1.48* 1.60* 1.47*  CALCIUM 9.5 9.0 9.0 8.9  GFRNONAA 15* 38* 34* 38*  GFRAA 17*  --   --   --   PROT  --   --  7.7  7.8 7.7  ALBUMIN  --   --  4.2  4.3 4.1  AST  --   --  40  42* 54*  ALT  --   --  37  38 36  ALKPHOS  --   --  73  78 80  BILITOT  --   --  1.2  1.3* 0.7  BILIDIR  --   --  0.3*  --   IBILI  --   --  1.0*  --      ASSESSMENT & PLAN:  Iron deficiency anemia due to chronic blood loss  Iron  deficiency anemia- unclear etiology; question GI blood loss/CKD [hx large hiatal hernia status post fundoplication].  Hemoglobin today is 12.9; iron sat- 26%%; ferertin-130. Proceed with infusion today.   # B12 deficiency-low B12-June 2021; recommend sublingual B12  # CHF [Dr.Khan]/ CKD-IV sec to DM [Dr.Singh]  #Mild thrombocytopenia platelets 129-intermittent [2018]-reasonable to continue Xarelto as long as platelets are above 50-60,000; and no active bleeding.  Discussed with the patient.  # DISPOSITION:   # IV Ferrahem today; NO B12  # follow up in jan 3rd week/ MD/possible IV ferreham; B12 injection-   labs-/b12; cbc/bmp/iron/ ferritin few days prior- Dr.B  Cc; Dr.Khan/Singh    Cammie Sickle, MD 08/16/2020 11:22 AM

## 2020-08-16 NOTE — Assessment & Plan Note (Addendum)
Iron deficiency anemia- unclear etiology; question GI blood loss/CKD [hx large hiatal hernia status post fundoplication].  Hemoglobin today is 12.9; iron sat- 26%%; ferertin-130. HOLD infusion today.   # B12 deficiency-low B12-June 2021; continue sublingual B12 for B12 injection  # CHF [Dr.Khan]/ CKD-IV sec to DM [Dr.Singh]  #Mild thrombocytopenia platelets 129-intermittent [2018]-reasonable to continue Xarelto as long as platelets are above 50-60,000; and no active bleeding.  Discussed with the patient.  # DISPOSITION:   # IV Ferrahem today; NO B12  # follow up in jan 3rd week/ MD/possible IV ferreham; B12 injection-   labs-/b12; cbc/bmp/iron/ ferritin few days prior- Dr.B  Cc; Dr.Khan/Singh

## 2020-08-23 ENCOUNTER — Ambulatory Visit: Payer: Medicare Other | Admitting: Nurse Practitioner

## 2020-09-04 ENCOUNTER — Other Ambulatory Visit: Payer: Self-pay | Admitting: General Surgery

## 2020-09-05 ENCOUNTER — Other Ambulatory Visit: Payer: Self-pay

## 2020-09-05 ENCOUNTER — Encounter: Payer: Self-pay | Admitting: Internal Medicine

## 2020-09-05 ENCOUNTER — Encounter: Payer: Self-pay | Admitting: Nurse Practitioner

## 2020-09-05 ENCOUNTER — Ambulatory Visit (INDEPENDENT_AMBULATORY_CARE_PROVIDER_SITE_OTHER): Payer: Medicare Other | Admitting: Nurse Practitioner

## 2020-09-05 VITALS — BP 136/69 | HR 51 | Temp 97.9°F | Wt 202.2 lb

## 2020-09-05 DIAGNOSIS — D631 Anemia in chronic kidney disease: Secondary | ICD-10-CM

## 2020-09-05 DIAGNOSIS — I5032 Chronic diastolic (congestive) heart failure: Secondary | ICD-10-CM | POA: Diagnosis not present

## 2020-09-05 DIAGNOSIS — E1122 Type 2 diabetes mellitus with diabetic chronic kidney disease: Secondary | ICD-10-CM

## 2020-09-05 DIAGNOSIS — N184 Chronic kidney disease, stage 4 (severe): Secondary | ICD-10-CM

## 2020-09-05 DIAGNOSIS — E1169 Type 2 diabetes mellitus with other specified complication: Secondary | ICD-10-CM

## 2020-09-05 DIAGNOSIS — Z6837 Body mass index (BMI) 37.0-37.9, adult: Secondary | ICD-10-CM

## 2020-09-05 DIAGNOSIS — I480 Paroxysmal atrial fibrillation: Secondary | ICD-10-CM

## 2020-09-05 DIAGNOSIS — E039 Hypothyroidism, unspecified: Secondary | ICD-10-CM

## 2020-09-05 DIAGNOSIS — I130 Hypertensive heart and chronic kidney disease with heart failure and stage 1 through stage 4 chronic kidney disease, or unspecified chronic kidney disease: Secondary | ICD-10-CM

## 2020-09-05 DIAGNOSIS — M85851 Other specified disorders of bone density and structure, right thigh: Secondary | ICD-10-CM

## 2020-09-05 DIAGNOSIS — D6869 Other thrombophilia: Secondary | ICD-10-CM | POA: Diagnosis not present

## 2020-09-05 DIAGNOSIS — E785 Hyperlipidemia, unspecified: Secondary | ICD-10-CM

## 2020-09-05 LAB — BAYER DCA HB A1C WAIVED: HB A1C (BAYER DCA - WAIVED): 7 % — ABNORMAL HIGH (ref <7.0)

## 2020-09-05 MED ORDER — RALOXIFENE HCL 60 MG PO TABS: ORAL_TABLET | ORAL | 4 refills | Status: DC

## 2020-09-05 MED ORDER — SITAGLIPTIN PHOSPHATE 25 MG PO TABS: ORAL_TABLET | ORAL | 4 refills | Status: DC

## 2020-09-05 NOTE — Assessment & Plan Note (Signed)
Noted on DEXA 10/01/2012.  Plan to check Vitamin D level today.  Repeat DEXA ordered -- continue Raloxifene and monitor kidney function closely with use.

## 2020-09-05 NOTE — Assessment & Plan Note (Signed)
Chronic, with BP at goal today on recheck and on home BP readings.  Continue current medication regimen as prescribed by cardiology and collaboration with cardiology and nephrology.  BMP today.  Recommend she continue to monitor BP regularly at home and document + bring to visits.  DASH diet focus.  Return in 3 months.

## 2020-09-05 NOTE — Assessment & Plan Note (Signed)
Chronic, ongoing.  Continue current Levothyroxine dose and adjust as needed based on lab results.  Thyroid labs up to date.

## 2020-09-05 NOTE — Assessment & Plan Note (Addendum)
Chronic, ongoing with rate controlled at this time.  Continue collaboration with cardiology and medication regimen as prescribed by Dr. Chancy Milroy.  Continue Xarelto, Amiodarone, and Metoprolol.

## 2020-09-05 NOTE — Assessment & Plan Note (Signed)
Chronic, with A1C at 7%, still below goal but upward in trend, and urine ALB 10 in February 2022.  Continue current medication regimen, Januvia and monitoring BS at home regularly. May benefit from addition of Jardiance for HF, dependent on kidney function at next visit.  Recommend focus on diabetic diet at home.  Return in 3 months.

## 2020-09-05 NOTE — Assessment & Plan Note (Signed)
Chronic, ongoing.  Followed by hematology and receiving iron transfusions.  Continue this collaboration, recent labs and note reviewed.

## 2020-09-05 NOTE — Patient Instructions (Signed)
Diabetes Mellitus and Nutrition, Adult When you have diabetes, or diabetes mellitus, it is very important to have healthy eating habits because your blood sugar (glucose) levels are greatly affected by what you eat and drink. Eating healthy foods in the right amounts, at about the same times every day, can help you:  Control your blood glucose.  Lower your risk of heart disease.  Improve your blood pressure.  Reach or maintain a healthy weight. What can affect my meal plan? Every person with diabetes is different, and each person has different needs for a meal plan. Your health care provider may recommend that you work with a dietitian to make a meal plan that is best for you. Your meal plan may vary depending on factors such as:  The calories you need.  The medicines you take.  Your weight.  Your blood glucose, blood pressure, and cholesterol levels.  Your activity level.  Other health conditions you have, such as heart or kidney disease. How do carbohydrates affect me? Carbohydrates, also called carbs, affect your blood glucose level more than any other type of food. Eating carbs naturally raises the amount of glucose in your blood. Carb counting is a method for keeping track of how many carbs you eat. Counting carbs is important to keep your blood glucose at a healthy level, especially if you use insulin or take certain oral diabetes medicines. It is important to know how many carbs you can safely have in each meal. This is different for every person. Your dietitian can help you calculate how many carbs you should have at each meal and for each snack. How does alcohol affect me? Alcohol can cause a sudden decrease in blood glucose (hypoglycemia), especially if you use insulin or take certain oral diabetes medicines. Hypoglycemia can be a life-threatening condition. Symptoms of hypoglycemia, such as sleepiness, dizziness, and confusion, are similar to symptoms of having too much  alcohol.  Do not drink alcohol if: ? Your health care provider tells you not to drink. ? You are pregnant, may be pregnant, or are planning to become pregnant.  If you drink alcohol: ? Do not drink on an empty stomach. ? Limit how much you use to:  0-1 drink a day for women.  0-2 drinks a day for men. ? Be aware of how much alcohol is in your drink. In the [place] one drink equals one 12 oz bottle of beer (355 mL), one 5 oz glass of wine (148 mL), or one 1 oz glass of hard liquor (44 mL). ? Keep yourself hydrated with water, diet soda, or unsweetened iced tea.  Keep in mind that regular soda, juice, and other mixers may contain a lot of sugar and must be counted as carbs. What are tips for following this plan? Reading food labels  Start by checking the serving size on the "Nutrition Facts" label of packaged foods and drinks. The amount of calories, carbs, fats, and other nutrients listed on the label is based on one serving of the item. Many items contain more than one serving per package.  Check the total grams (g) of carbs in one serving. You can calculate the number of servings of carbs in one serving by dividing the total carbs by 15. For example, if a food has 30 g of total carbs per serving, it would be equal to 2 servings of carbs.  Check the number of grams (g) of saturated fats and trans fats in one serving. Choose foods that have   a low amount or none of these fats.  Check the number of milligrams (mg) of salt (sodium) in one serving. Most people should limit total sodium intake to less than 2,300 mg per day.  Always check the nutrition information of foods labeled as "low-fat" or "nonfat." These foods may be higher in added sugar or refined carbs and should be avoided.  Talk to your dietitian to identify your daily goals for nutrients listed on the label. Shopping  Avoid buying canned, pre-made, or processed foods. These foods tend to be high in fat, sodium, and added  sugar.  Shop around the outside edge of the grocery store. This is where you will most often find fresh fruits and vegetables, bulk grains, fresh meats, and fresh dairy. Cooking  Use low-heat cooking methods, such as baking, instead of high-heat cooking methods like deep frying.  Cook using healthy oils, such as olive, canola, or sunflower oil.  Avoid cooking with butter, cream, or high-fat meats. Meal planning  Eat meals and snacks regularly, preferably at the same times every day. Avoid going long periods of time without eating.  Eat foods that are high in fiber, such as fresh fruits, vegetables, beans, and whole grains. Talk with your dietitian about how many servings of carbs you can eat at each meal.  Eat 4-6 oz (112-168 g) of lean protein each day, such as lean meat, chicken, fish, eggs, or tofu. One ounce (oz) of lean protein is equal to: ? 1 oz (28 g) of meat, chicken, or fish. ? 1 egg. ?  cup (62 g) of tofu.  Eat some foods each day that contain healthy fats, such as avocado, nuts, seeds, and fish.   What foods should I eat? Fruits Berries. Apples. Oranges. Peaches. Apricots. Plums. Grapes. Mango. Papaya. Pomegranate. Kiwi. Cherries. Vegetables Lettuce. Spinach. Leafy greens, including kale, chard, collard greens, and mustard greens. Beets. Cauliflower. Cabbage. Broccoli. Carrots. Green beans. Tomatoes. Peppers. Onions. Cucumbers. Brussels sprouts. Grains Whole grains, such as whole-wheat or whole-grain bread, crackers, tortillas, cereal, and pasta. Unsweetened oatmeal. Quinoa. Brown or wild rice. Meats and other proteins Seafood. Poultry without skin. Lean cuts of poultry and beef. Tofu. Nuts. Seeds. Dairy Low-fat or fat-free dairy products such as milk, yogurt, and cheese. The items listed above may not be a complete list of foods and beverages you can eat. Contact a dietitian for more information. What foods should I avoid? Fruits Fruits canned with  syrup. Vegetables Canned vegetables. Frozen vegetables with butter or cream sauce. Grains Refined white flour and flour products such as bread, pasta, snack foods, and cereals. Avoid all processed foods. Meats and other proteins Fatty cuts of meat. Poultry with skin. Breaded or fried meats. Processed meat. Avoid saturated fats. Dairy Full-fat yogurt, cheese, or milk. Beverages Sweetened drinks, such as soda or iced tea. The items listed above may not be a complete list of foods and beverages you should avoid. Contact a dietitian for more information. Questions to ask a health care provider  Do I need to meet with a diabetes educator?  Do I need to meet with a dietitian?  What number can I call if I have questions?  When are the best times to check my blood glucose? Where to find more information:  American Diabetes Association: diabetes.org  Academy of Nutrition and Dietetics: www.eatright.org  National Institute of Diabetes and Digestive and Kidney Diseases: www.niddk.nih.gov  Association of Diabetes Care and Education Specialists: www.diabeteseducator.org Summary  It is important to have healthy eating   habits because your blood sugar (glucose) levels are greatly affected by what you eat and drink.  A healthy meal plan will help you control your blood glucose and maintain a healthy lifestyle.  Your health care provider may recommend that you work with a dietitian to make a meal plan that is best for you.  Keep in mind that carbohydrates (carbs) and alcohol have immediate effects on your blood glucose levels. It is important to count carbs and to use alcohol carefully. This information is not intended to replace advice given to you by your health care provider. Make sure you discuss any questions you have with your health care provider. Document Revised: 01/12/2019 Document Reviewed: 01/12/2019 Elsevier Patient Education  2021 Elsevier Inc.  

## 2020-09-05 NOTE — Assessment & Plan Note (Signed)
BMI 36.98.  Recommended eating smaller high protein, low fat meals more frequently and exercising 30 mins a day 5 times a week with a goal of 10-15lb weight loss in the next 3 months. Patient voiced their understanding and motivation to adhere to these recommendations. ?

## 2020-09-05 NOTE — Assessment & Plan Note (Signed)
Chronic, ongoing.  Euvolemic today.  Continue collaboration with cardiology and current medication regimen.  Recommend: - Reminded to call for an overnight weight gain of >2 pounds or a weekly weight weight of >5 pounds - not adding salt to his food and has been reading food labels. Reviewed the importance of keeping daily sodium intake to 2000mg  daily

## 2020-09-05 NOTE — Assessment & Plan Note (Signed)
Chronic, ongoing.  Continue current medication regimen and adjust as needed. Lipid panel today. 

## 2020-09-05 NOTE — Assessment & Plan Note (Signed)
Secondary to Xarelto use.  Continue to monitor for bleeding and wounds.  Gentle skin care at home.  Followed by hematology for anemia.

## 2020-09-05 NOTE — Progress Notes (Signed)
BP 136/69 (BP Location: Left Wrist, Cuff Size: Normal)   Pulse (!) 51   Temp 97.9 F (36.6 C) (Oral)   Wt 202 lb 3.2 oz (91.7 kg)   SpO2 95%   BMI 36.98 kg/m    Subjective:    Patient ID: Erica Larsen, female    DOB: 1950/07/23, 70 y.o.   MRN: [id number]  HPI: Erica Larsen is a 70 y.o. female  Chief Complaint  Patient presents with   Diabetes    Patient denies having a recent Diabetic Eye Exam.    Hypertension   Osteopenia   DIABETES Last A1C in February 5.8%.  Continues on Januvia 25 MG (renal dosed).  Hypoglycemic episodes:no Polydipsia/polyuria: no Visual disturbance: no Chest pain: no Paresthesias: no Glucose Monitoring: rarely             Accucheck frequency: rarely             Fasting glucose: this morning 144, often under 130             Post prandial:             Evening:             Before meals: Taking Insulin?: no             Long acting insulin:             Short acting insulin: Blood Pressure Monitoring: daily Retinal Examination: Not up to Date Foot Exam: Up to Date Pneumovax: Up to Date Influenza: Up to Date Aspirin: no    ATRIAL FIBRILLATION Followed by cardiology, she reports seeing the first part of June.  No changes made. Continues on Amiodarone. Atrial fibrillation status: stable Satisfied with current treatment: yes  Medication side effects:  no Medication compliance: good compliance Etiology of atrial fibrillation: unknown Palpitations:  no Chest pain:  no Dyspnea on exertion: none Orthopnea:  no Syncope:  no Edema:  no Ventricular rate control: B-blocker Anti-coagulation: long acting   HYPERTENSION / HYPERLIPIDEMIA/CHF Continues on Atorvastatin for HLD.   Followed by Dr. Humphrey Rolls, cardiology.  Continues on Olmesartan-Amlodipine-HCTZ, Amiodarone, Metoprolol, Lasix.  Last Echo with EF 49% in May 2020. Satisfied with current treatment? yes Duration of hypertension: chronic BP monitoring frequency: rarely BP range: 120/70-80 BP  medication side effects: no Duration of hyperlipidemia: chronic Cholesterol medication side effects: no Cholesterol supplements: none Medication compliance: good compliance Aspirin: no Recent stressors: no Recurrent headaches: no Visual changes: no Palpitations: no Dyspnea: no Chest pain: no Lower extremity edema: no Dizzy/lightheaded: no    CHRONIC KIDNEY DISEASE Saw [facility] saw on 05/29/20 -- GFR 38, CRT 1.47, BUN 21.  Has history of IV contract exposure in June 2018.  Is currently followed by hematology for chronic iron deficiency anemia with last visit on 08/16/20-- no iron infusion that visit. CKD status: stable Medications renally dose: yes Previous renal evaluation: yes Pneumovax:  Up to Date Influenza Vaccine:  Up to Date  HYPOTHYROIDISM Continues on Levothyroxine 112 MCG daily. Thyroid control status:stable Satisfied with current treatment? yes Medication side effects: no Medication compliance: good compliance Etiology of hypothyroidism:  Recent dose adjustment:no Fatigue: no Cold intolerance: no Heat intolerance: no Weight gain: no Weight loss: no Constipation: no Diarrhea/loose stools: no Palpitations: no Lower extremity edema: no Anxiety/depressed mood: no   OSTEOPENIA Last DEXA 10/01/2012.  Noted T-score of  -1.3. Continues on Raloxifene. Satisfied with current treatment?: yes Medication side effects: no Medication compliance: good compliance Past osteoporosis  medications/treatments:  Adequate calcium & vitamin D: yes Weight bearing exercises: yes   Relevant past medical, surgical, family and social history reviewed and updated as indicated. Interim medical history since our last visit reviewed. Allergies and medications reviewed and updated.  Review of Systems  Constitutional:  Negative for activity change, appetite change, diaphoresis, fatigue and fever.  Respiratory:  Negative for cough, chest tightness and shortness of breath.    Cardiovascular:  Negative for chest pain, palpitations and leg swelling.  Gastrointestinal: Negative.   Endocrine: Negative for cold intolerance, heat intolerance, polydipsia, polyphagia and polyuria.  Neurological: Negative.   Psychiatric/Behavioral: Negative.     Per HPI unless specifically indicated above     Objective:    BP 136/69 (BP Location: Left Wrist, Cuff Size: Normal)   Pulse (!) 51   Temp 97.9 F (36.6 C) (Oral)   Wt 202 lb 3.2 oz (91.7 kg)   SpO2 95%   BMI 36.98 kg/m   Wt Readings from Last 3 Encounters:  09/05/20 202 lb 3.2 oz (91.7 kg)  08/16/20 202 lb 9.6 oz (91.9 kg)  07/10/20 200 lb (90.7 kg)    Physical Exam Vitals and nursing note reviewed.  Constitutional:      General: She is awake. She is not in acute distress.    Appearance: She is well-developed and well-groomed. She is morbidly obese. She is not ill-appearing.  HENT:     Head: Normocephalic.     Right Ear: Hearing normal.     Left Ear: Hearing normal.  Eyes:     General: Lids are normal.        Right eye: No discharge.        Left eye: No discharge.     Conjunctiva/sclera: Conjunctivae normal.     Pupils: Pupils are equal, round, and reactive to light.  Neck:     Thyroid: No thyromegaly.     Vascular: No carotid bruit.  Cardiovascular:     Rate and Rhythm: Normal rate and regular rhythm.     Heart sounds: Murmur heard.  Systolic murmur is present with a grade of 2/6.    No gallop.  Pulmonary:     Effort: Pulmonary effort is normal. No accessory muscle usage or respiratory distress.     Breath sounds: Normal breath sounds.  Abdominal:     General: Bowel sounds are normal.     Palpations: Abdomen is soft.  Musculoskeletal:     Cervical back: Normal range of motion and neck supple.     Right hip: Normal.     Left hip: No tenderness, bony tenderness or crepitus. Normal range of motion. Normal strength.     Right lower leg: No edema.     Left lower leg: No edema.  Skin:    General:  Skin is warm and dry.  Neurological:     Mental Status: She is alert and oriented to person, place, and time.  Psychiatric:        Attention and Perception: Attention normal.        Mood and Affect: Mood normal.        Behavior: Behavior normal. Behavior is cooperative.        Thought Content: Thought content normal.        Judgment: Judgment normal.   Results for orders placed or performed in visit on 08/14/20  CBC with Differential  Result Value Ref Range   WBC 8.1 4.0 - 10.5 K/uL   RBC 3.83 (L) 3.87 - 5.11  MIL/uL   Hemoglobin 12.9 12.0 - 15.0 g/dL   HCT 39.1 36.0 - 46.0 %   MCV 102.1 (H) 80.0 - 100.0 fL   MCH 33.7 26.0 - 34.0 pg   MCHC 33.0 30.0 - 36.0 g/dL   RDW 13.7 11.5 - 15.5 %   Platelets 129 (L) 150 - 400 K/uL   nRBC 0.0 0.0 - 0.2 %   Neutrophils Relative % 71 %   Neutro Abs 5.8 1.7 - 7.7 K/uL   Lymphocytes Relative 18 %   Lymphs Abs 1.4 0.7 - 4.0 K/uL   Monocytes Relative 9 %   Monocytes Absolute 0.7 0.1 - 1.0 K/uL   Eosinophils Relative 2 %   Eosinophils Absolute 0.1 0.0 - 0.5 K/uL   Basophils Relative 0 %   Basophils Absolute 0.0 0.0 - 0.1 K/uL   Immature Granulocytes 0 %   Abs Immature Granulocytes 0.02 0.00 - 0.07 K/uL  Iron and TIBC  Result Value Ref Range   Iron 108 28 - 170 ug/dL   TIBC 435 250 - 450 ug/dL   Saturation Ratios 25 10.4 - 31.8 %   UIBC 327 ug/dL  Comprehensive metabolic panel  Result Value Ref Range   Sodium 136 135 - 145 mmol/L   Potassium 3.9 3.5 - 5.1 mmol/L   Chloride 100 98 - 111 mmol/L   CO2 28 22 - 32 mmol/L   Glucose, Bld 131 (H) 70 - 99 mg/dL   BUN 21 8 - 23 mg/dL   Creatinine, Ser 1.47 (H) 0.44 - 1.00 mg/dL   Calcium 8.9 8.9 - 10.3 mg/dL   Total Protein 7.7 6.5 - 8.1 g/dL   Albumin 4.1 3.5 - 5.0 g/dL   AST 54 (H) 15 - 41 U/L   ALT 36 0 - 44 U/L   Alkaline Phosphatase 80 38 - 126 U/L   Total Bilirubin 0.7 0.3 - 1.2 mg/dL   GFR, Estimated 38 (L) >60 mL/min   Anion gap 8 5 - 15  Ferritin  Result Value Ref Range    Ferritin 132 11 - 307 ng/mL  Vitamin B12  Result Value Ref Range   Vitamin B-12 876 180 - 914 pg/mL      Assessment & Plan:   Problem List Items Addressed This Visit       Cardiovascular and Mediastinum   Chronic diastolic heart failure (HCC)    Chronic, ongoing.  Euvolemic today.  Continue collaboration with cardiology and current medication regimen.  Recommend: - Reminded to call for an overnight weight gain of >2 pounds or a weekly weight weight of >5 pounds - not adding salt to his food and has been reading food labels. 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  Continue current medication regimen as prescribed by cardiology and collaboration with cardiology and nephrology.  BMP today.  Recommend she continue to monitor BP regularly at home and document + bring to visits.  DASH diet focus.  Return in 3 months.       Relevant Orders   Bayer DCA Hb A1c Waived   Basic metabolic panel   Atrial fibrillation (HCC)    Chronic, ongoing with rate controlled at this time.  Continue collaboration with cardiology and medication regimen as prescribed by Dr. Chancy Milroy.  Continue  Xarelto, Amiodarone, and Metoprolol.           Endocrine   Hyperlipidemia associated with type 2 diabetes mellitus (HCC)    Chronic, ongoing.  Continue current medication regimen and adjust as needed.  Lipid panel today.       Relevant Medications   sitaGLIPtin (JANUVIA) 25 MG tablet   Other Relevant Orders   Bayer DCA Hb A1c Waived   Lipid Panel w/o Chol/HDL Ratio   Type 2 diabetes mellitus with stage 4 chronic kidney disease, without long-term current use of insulin (HCC) - Primary    Chronic, with A1C at 7%, still below goal but upward in trend, and urine ALB 10 in February 2022.  Continue current medication regimen, Januvia and  monitoring BS at home regularly. May benefit from addition of Jardiance for HF, dependent on kidney function at next visit.  Recommend focus on diabetic diet at home.  Return in 3 months.         Relevant Medications   sitaGLIPtin (JANUVIA) 25 MG tablet   Other Relevant Orders   Bayer DCA Hb A1c Waived   Hypothyroid    Chronic, ongoing.  Continue current Levothyroxine dose and adjust as needed based on lab results.  Thyroid labs up to date.         Musculoskeletal and Integument   Osteopenia    Noted on DEXA 10/01/2012.  Plan to check Vitamin D level today.  Repeat DEXA ordered -- continue Raloxifene and monitor kidney function closely with use.       Relevant Medications   raloxifene (EVISTA) 60 MG tablet   Other Relevant Orders   VITAMIN D 25 Hydroxy (Vit-D Deficiency, Fractures)     Genitourinary   Chronic kidney disease, stage 4 (severe) (HCC)    Chronic, followed by nephrology at this time.  Urine micro alb 10 and A:C <30 February 2022.  Continue Olmesartan for kidney protection and monitor medications for renal dosing.  BMP today.  Avoid nephrotoxic medications.         Hematopoietic and Hemostatic   Acquired thrombophilia ([place])    Secondary to Xarelto use.  Continue to monitor for bleeding and wounds.  Gentle skin care at home.  Followed by hematology for anemia.         Other   Morbid obesity (HCC)    BMI 36.98. Recommended eating smaller high protein, low fat meals more frequently and exercising 30 mins a day 5 times a week with a goal of 10-15lb weight loss in the next 3 months. Patient voiced their understanding and motivation to adhere to these recommendations.        Relevant Medications   sitaGLIPtin (JANUVIA) 25 MG tablet   Anemia due to stage 4 chronic kidney disease (HCC)    Chronic, ongoing.  Followed by hematology and receiving iron transfusions.  Continue this collaboration, recent labs and note reviewed.       BMI 37.0-37.9, adult    Refer to  morbid obesity plan of care.         Follow up plan: Return in about 3 months (around 12/06/2020) for T2DM, HTN/HLD, A-FIB, HF, THYROID, OSTEOPENIA.

## 2020-09-05 NOTE — Assessment & Plan Note (Signed)
Chronic, followed by nephrology at this time.  Urine micro alb 10 and A:C <30 February 2022.  Continue Olmesartan for kidney protection and monitor medications for renal dosing.  BMP today.  Avoid nephrotoxic medications.

## 2020-09-05 NOTE — Assessment & Plan Note (Signed)
Refer to morbid obesity plan of care. 

## 2020-09-06 LAB — BASIC METABOLIC PANEL
BUN/Creatinine Ratio: 11 — ABNORMAL LOW (ref 12–28)
BUN: 18 mg/dL (ref 8–27)
CO2: 26 mmol/L (ref 20–29)
Calcium: 9 mg/dL (ref 8.7–10.3)
Chloride: 99 mmol/L (ref 96–106)
Creatinine, Ser: 1.59 mg/dL — ABNORMAL HIGH (ref 0.57–1.00)
Glucose: 138 mg/dL — ABNORMAL HIGH (ref 65–99)
Potassium: 4.6 mmol/L (ref 3.5–5.2)
Sodium: 140 mmol/L (ref 134–144)
eGFR: 34 mL/min/{1.73_m2} — ABNORMAL LOW (ref >59)

## 2020-09-06 LAB — LIPID PANEL W/O CHOL/HDL RATIO
Cholesterol, Total: 108 mg/dL (ref 100–199)
HDL: 56 mg/dL (ref >39)
LDL Chol Calc (NIH): 38 mg/dL (ref 0–99)
Triglycerides: 63 mg/dL (ref 0–149)
VLDL Cholesterol Cal: 14 mg/dL (ref 5–40)

## 2020-09-06 LAB — VITAMIN D 25 HYDROXY (VIT D DEFICIENCY, FRACTURES): Vit D, 25-Hydroxy: 40.3 ng/mL (ref 30.0–100.0)

## 2020-09-06 NOTE — Progress Notes (Signed)
Good morning, please let Novi know her labs have returned and kidney function remains baseline for her.  Cholesterol labs remain stable and Vit D level normal.  Any questions? Keep being awesome!!  Thank you for allowing me to participate in your care.  I appreciate you. Kindest regards, Perrion Diesel

## 2020-09-14 NOTE — Progress Notes (Signed)
Subjective:     Patient ID: Erica Larsen is a 70 y.o. female.   HPI   The following portions of the patient's history were reviewed and updated as appropriate.   This an established patient is here today for: office visit. Here for her pre op visit, excision perianal warts scheduled for 09-22-20. She states her symptoms are still about the same, her rectal area feels "raw" with occasionally bleeding.   Review of Systems  Constitutional: Negative for chills and fever.  Respiratory: Negative for cough.          Chief Complaint  Patient presents with   Pre-op Exam      BP (!) 156/76   Pulse 55   Temp 36.6 C (97.9 F)   Ht 157.5 cm (5\' 2" )   Wt 91.6 kg (202 lb)   SpO2 94%   BMI 36.95 kg/m        Past Medical History:  Diagnosis Date   Anemia     Chronic kidney disease     Coronary artery disease     Diabetes mellitus type 2, uncomplicated (CMS-HCC)     Diverticulosis     Esophagitis     GERD (gastroesophageal reflux disease)     Heart disease     Hiatal hernia     High cholesterol     Hypercalcemia     Hyperlipidemia     Hypertension     Hypothyroidism     Lumbar radiculopathy     Osteoarthritis     Osteopenia             Past Surgical History:  Procedure Laterality Date   CATARACT EXTRACTION Bilateral     COLONOSCOPY   08/01/2015   coronary angioplasty with stent placement       EGD   05/14/2016   givens capsule study   06/01/2015   HEART STENT       pre-cancer removed        left forearm   REPAIR HIATAL HERNIA   09/24/2016   squamous cell carcinoma surgery                     OB History     Gravida  2   Para  2   Term      Preterm      AB      Living         SAB      IAB      Ectopic      Molar      Multiple      Live Births           Obstetric Comments  Age at first period 29 Age of first pregnancy 37             Social History           Socioeconomic History   Marital status: Married  Tobacco Use    Smoking status: Never Smoker   Smokeless tobacco: Never Used  Substance and Sexual Activity   Alcohol use: No      Comment: occassionally   Drug use: No   Sexual activity: Defer            Allergies  Allergen Reactions   Sulfa (Sulfonamide Antibiotics) Anaphylaxis   Latex Rash      Current Medications        Current Outpatient Medications  Medication Sig Dispense Refill   AMIOdarone (PACERONE)  200 MG tablet Take 200 mg by mouth once daily       atorvastatin (LIPITOR) 20 MG tablet Take 80 mg by mouth once daily       cyanocobalamin (VITAMIN B12) 1000 MCG tablet Take 1,000 mcg by mouth once daily       ferrous sulfate 325 (65 FE) MG tablet Take 325 mg by mouth daily with breakfast       FUROsemide (LASIX) 40 MG tablet Take 40 mg by mouth 2 (two) times daily Patient takes 40 mg in the morning and 20 mg at noon.       lansoprazole (PREVACID) 30 MG DR capsule Take 30 mg by mouth once daily.       levothyroxine (SYNTHROID, LEVOTHROID) 100 MCG tablet Take 112 mcg by mouth once daily Take on an empty stomach with a glass of water at least 30-60 minutes before breakfast.       metoprolol succinate (TOPROL-XL) 100 MG XL tablet Take 100 mg by mouth once daily.       olmesartan (BENICAR) 40 MG tablet Take 40 mg by mouth once daily.       pioglitazone (ACTOS) 30 MG tablet Take 30 mg by mouth once daily.       raloxifene (EVISTA) 60 mg tablet Take 60 mg by mouth once daily.       rivaroxaban (XARELTO) 20 mg tablet Take by mouth once daily       RSH ONC polyethylene glycol 0.4%/propylene glycol 0.3% (IMMUNOGEN [id number]) -eIRB [id number] ophthalmic solution Apply 1-2 drops to eye once daily For Investigational use only. Instill 1-2 drops in each eye daily. Use 15 minutes after corticosteroid administration on days both to be used. Shake well before using.       SITagliptin (JANUVIA) 25 MG tablet Take 25 mg by mouth once daily       amLODIPine (NORVASC) 5 MG tablet Take 5 mg by mouth once daily.  (Patient not taking: No sig reported)       aspirin 81 MG EC tablet Take 81 mg by mouth once daily. (Patient not taking: No sig reported)       clopidogrel (PLAVIX) 75 mg tablet Take 75 mg by mouth once daily. (Patient not taking: No sig reported)        No current facility-administered medications for this visit.             Family History  Problem Relation Age of Onset   Diabetes Mother     Heart disease Mother     Kidney failure Mother     High blood pressure (Hypertension) Mother     High blood pressure (Hypertension) Father     Myocardial Infarction (Heart attack) Father     Thyroid disease Father     High blood pressure (Hypertension) Sister     Diabetes Sister     Colon cancer Brother     Breast cancer Paternal Aunt     Thyroid disease Daughter                   Objective:   Physical Exam Exam conducted with a chaperone present.  Constitutional:      Appearance: Normal appearance.  Cardiovascular:     Rate and Rhythm: Normal rate and regular rhythm.     Pulses: Normal pulses.     Heart sounds: Normal heart sounds.  Pulmonary:     Effort: Pulmonary effort is normal.     Breath sounds: Normal breath sounds.  Musculoskeletal:     Cervical back: Neck supple.  Skin:    General: Skin is warm and dry.  Neurological:     Mental Status: She is alert and oriented to person, place, and time.  Psychiatric:        Mood and Affect: Mood normal.        Behavior: Behavior normal.      Labs and Radiology:      August 01, 2015 colonoscopy:   Use Gamez completed for an abnormal imaging study.  2 sessile polyps, 2-3 mm in size were removed.  Diverticulosis.  Increased mucosal vascular pattern at the terminal ileum.  Normal perianal digital exam   ECG of Jun 29, 2020:   Normal sinus rhythm, right axis deviation, nonspecific intraventricular conduction block.    Assessment:     Condyloma acuminata.    Plan:     Plan for surgical extirpation reviewed.  We will  complete anoscopy at the same setting to determine if any lesions are within the anal canal.      This note is partially prepared by Karie Fetch, RN, acting as a scribe in the presence of Dr. Hervey Ard, MD.  The documentation recorded by the scribe accurately reflects the service I personally performed and the decisions made by me.    Robert Bellow, MD FACS

## 2020-09-15 ENCOUNTER — Other Ambulatory Visit: Payer: Self-pay

## 2020-09-15 ENCOUNTER — Encounter
Admission: RE | Admit: 2020-09-15 | Discharge: 2020-09-15 | Disposition: A | Discharge status: Home / Self Care | Payer: Medicare Other | Source: Ambulatory Visit | Attending: General Surgery | Admitting: General Surgery

## 2020-09-15 NOTE — Progress Notes (Signed)
Perioperative Services Pre-Admission/Anesthesia Testing    Date: 09/15/20  Name: Erica Larsen MRN:   [id number]  Re: Updated review and clearance for surgery   Case: [id number] Date/Time: 09/22/20 1500   Procedure: FULGURATION ANAL WART   Anesthesia type: General   Pre-op diagnosis: perianal warts   Location: [facility] OR ROOM 05 / [place] [facility]   Surgeons: Robert Bellow, MD   Clinical Notes:  Patient has recently been reviewed by PAT APP; see my note dated 06/29/2020.    She was initially scheduled for the above procedure on 06/30/2020, however procedure was canceled as patient presented to the ED on 06/29/2020 with complaints of chest pain.  Notes from ED course reviewed.  Work-up for ACS was negative.  Patient was ultimately discharged home in stable condition with instructions to follow-up with outpatient cardiology for further evaluation.  Patient was seen in follow-up by her primary cardiologist Humphrey Rolls, MD).  Patient complained of episodes of infrequent chest pain and progressive DOE.  She denied any PND, orthopnea, palpitations, vertiginous symptoms, or presyncope/syncope.  Patient with increased bilateral lower extremity edema.  Noninvasive cardiovascular studies were ordered.    Patient underwent myocardial perfusion imaging study on 08/02/2020 that revealed a normal left ventricular systolic function with an EF of 58%.  There was a small mild reversible defect noted at the apex and inferior wall hypokinesis.  Study was interpreted as equivocal.    She subsequently underwent a TTE on 08/07/2020 that revealed normal left ventricular systolic function with an EF of 58%.  There was mild to moderate biatrial enlargement.  Additionally, there was mild to moderate pulmonic, mitral, and tricuspid valve regurgitation noted.  There was no evidence of valvular stenosis.  There was a trivial pericardial effusion observed.  Patient has been rescheduled for the anal wart  fulguration procedure on 1 09/22/2020 with Dr. Hervey Ard.  Given patient's history of cardiovascular diagnoses and recent complaints of anginal/anginal equivalent symptoms, presurgical cardiac clearance was sought by the PAT team.  Per cardiology, "patient is stable and optimized for planned procedure.  She may proceed with an overall LOW risk of significant perioperative cardiovascular complications". This patient is on daily anticoagulation therapy.  Per primary attending surgeon, patient may continue her daily rivaroxaban throughout the perioperative period.  Pertinent Clinical Results:   Lab Results  Component Value Date   WBC 8.1 08/14/2020   HGB 12.9 08/14/2020   HCT 39.1 08/14/2020   MCV 102.1 (H) 08/14/2020   PLT 129 (L) 08/14/2020   Lab Results  Component Value Date   NA 140 09/05/2020   K 4.6 09/05/2020   CO2 26 09/05/2020   GLUCOSE 138 (H) 09/05/2020   BUN 18 09/05/2020   CREATININE 1.59 (H) 09/05/2020   CALCIUM 9.0 09/05/2020   GFRNONAA 38 (L) 08/14/2020   GFRAA 17 (L) 10/05/2019    ECG: Date: 06/29/2020 Time ECG obtained: 0835 AM Rate: 60 bpm Rhythm: normal sinus; LBBB Axis (leads I and aVF): Right axis deviation Intervals: PR 202 ms. QRS 140 ms. QTc 482 ms. ST segment and T wave changes: No evidence of acute ST segment elevation or depression Comparison: Similar to previous tracing obtained on 05/30/2015   IMAGING / PROCEDURES: TRANSTHORACIC ECHOCARDIOGRAM performed on 08/07/2020 LVEF 58% Left ventricular systolic function normal Mild right ventricular dilation Normal wall motion Moderate dilation of the left atrium Mild dilation of the right atrium Mild calcification of the aortic valve cusps with no evidence of stenosis or regurgitation Mild to  moderate pulmonic valve regurgitation with no evidence of stenosis Mild mitral valve regurgitation Moderate tricuspid valve regurgitation Trivial pericardial effusion  ADENOSINE MYOCARDIAL PERFUSION  IMAGING STUDY performed on 08/02/2020 LVEF 58% Small mild reversible apex wall defect Inferior wall hypokinesis Sinus bradycardia at a rate of 50 bpm; LBBB.  There were no significant changes with adenosine. Equivocal stress test with normal LVEF; correlate clinically  CORONARY CTA performed on 09/30/2014 Calcium score 598.2 Right dominant system Moderate disease in the mid RCA and moderate disease proximal to the stent in the mid LAD.  Stent in the mid LAD has no significant in-stent restenosis; treat medically. Mild to moderate mitral valve regurgitation   LEFT HEART CATHETERIZATION AND CORONARY ANGIOGRAPHY performed on 12/04/2006 Coronary artery disease 80% stenosis of the mid LAD 70% stenosis of the RCA PCI performed and a 2.75 x 23 mm Xience V DES x 1 placed to mid LAD  Impression and Plan:  Wanisha Shiroma Guinyard has been referred for pre-anesthesia review and clearance prior to her undergoing the planned anesthetic and procedural courses. Available labs, pertinent testing, and imaging results were personally reviewed by me. This patient has been appropriately cleared by cardiology with an overall LOW risk of significant perioperative cardiovascular complications.  Based on clinical review performed today (09/15/20), barring any significant acute changes in the patient's overall condition, it is anticipated that she will be able to proceed with the planned surgical intervention. Any acute changes in clinical condition may necessitate her procedure being postponed and/or cancelled. Patient will meet with anesthesia team (MD and/or CRNA) on this day of her procedure for preoperative evaluation/assessment.   Pre-surgical instructions were reviewed with the patient during her PAT appointment and questions were fielded by PAT clinical staff. Patient was advised that if any questions or concerns arise prior to her procedure then she should return a call to PAT and/or her surgeon's office to  discuss.  Honor Loh, MSN, APRN, FNP-C, CEN [facility]  Peri-operative Services Nurse Practitioner Phone: [phone] 09/15/20 10:34 AM  NOTE: This note has been prepared using Dragon dictation software. Despite my best ability to proofread, there is always the potential that unintentional transcriptional errors may still occur from this process.

## 2020-09-15 NOTE — Patient Instructions (Addendum)
Your procedure is scheduled on: Friday, August 5 Report to the Registration Desk on the 1st floor of the [facility]. To find out your arrival time, please call [phone] between 1PM - 3PM on: Thursday, August 4  REMEMBER: Instructions that are not followed completely may result in serious medical risk, up to and including death; or upon the discretion of your surgeon and anesthesiologist your surgery may need to be rescheduled.  Do not eat food after midnight the night before surgery.  No gum chewing, lozengers or hard candies.  You may however, drink water up to 2 hours before you are scheduled to arrive for your surgery. Do not drink anything within 2 hours of your scheduled arrival time.  TAKE THESE MEDICATIONS THE MORNING OF SURGERY WITH A SIP OF WATER:  Amiodarone Atorvastatin Lansoprazole - (take one the night before and one on the morning of surgery - helps to prevent nausea after surgery.) Levothyroxine Metoprolol  Follow recommendations from Cardiologist regarding stopping Xarelto. According to Dr. Bary Castilla no need to stop.  One week prior to surgery: starting July 29 Stop Anti-inflammatories (NSAIDS) such as Advil, Aleve, Ibuprofen, Motrin, Naproxen, Naprosyn and Aspirin based products such as Excedrin, Goodys Powder, BC Powder. Stop ANY OVER THE COUNTER supplements until after surgery. You may however, continue to take Tylenol if needed for pain up until the day of surgery.  No Alcohol for 24 hours before or after surgery.  No Smoking including e-cigarettes for 24 hours prior to surgery.  No chewable tobacco products for at least 6 hours prior to surgery.  No nicotine patches on the day of surgery.  Do not use any "recreational" drugs for at least a week prior to your surgery.  Please be advised that the combination of cocaine and anesthesia may have negative outcomes, up to and including death. If you test positive for cocaine, your surgery will be  cancelled.  On the morning of surgery brush your teeth with toothpaste and water, you may rinse your mouth with mouthwash if you wish. Do not swallow any toothpaste or mouthwash.  Do not wear jewelry, make-up, hairpins, clips or nail polish.  Do not wear lotions, powders, or perfumes.   Do not shave body from the neck down 48 hours prior to surgery just in case you cut yourself which could leave a site for infection.   Contact lenses, hearing aids and dentures may not be worn into surgery.  Do not bring valuables to the hospital. [facility] is not responsible for any missing/lost belongings or valuables.   Notify your doctor if there is any change in your medical condition (cold, fever, infection).  Wear comfortable clothing (specific to your surgery type) to the hospital.  After surgery, you can help prevent lung complications by doing breathing exercises.  Take deep breaths and cough every 1-2 hours. Your doctor may order a device called an Incentive Spirometer to help you take deep breaths.  If you are being discharged the day of surgery, you will not be allowed to drive home. You will need a responsible adult (18 years or older) to drive you home and stay with you that night.   If you are taking public transportation, you will need to have a responsible adult (18 years or older) with you. Please confirm with your physician that it is acceptable to use public transportation.   Please call the [place] Dept. at [phone] if you have any questions about these instructions.  Surgery Visitation Policy:  Patients undergoing a surgery or procedure may have one family member or support person with them as long as that person is not COVID-19 positive or experiencing its symptoms.  That person may remain in the waiting area during the procedure.

## 2020-09-18 DIAGNOSIS — D2261 Melanocytic nevi of right upper limb, including shoulder: Secondary | ICD-10-CM | POA: Diagnosis not present

## 2020-09-18 DIAGNOSIS — X32XXXA Exposure to sunlight, initial encounter: Secondary | ICD-10-CM | POA: Diagnosis not present

## 2020-09-18 DIAGNOSIS — L820 Inflamed seborrheic keratosis: Secondary | ICD-10-CM | POA: Diagnosis not present

## 2020-09-18 DIAGNOSIS — D225 Melanocytic nevi of trunk: Secondary | ICD-10-CM | POA: Diagnosis not present

## 2020-09-18 DIAGNOSIS — D2272 Melanocytic nevi of left lower limb, including hip: Secondary | ICD-10-CM | POA: Diagnosis not present

## 2020-09-18 DIAGNOSIS — L538 Other specified erythematous conditions: Secondary | ICD-10-CM | POA: Diagnosis not present

## 2020-09-18 DIAGNOSIS — D2262 Melanocytic nevi of left upper limb, including shoulder: Secondary | ICD-10-CM | POA: Diagnosis not present

## 2020-09-18 DIAGNOSIS — L570 Actinic keratosis: Secondary | ICD-10-CM | POA: Diagnosis not present

## 2020-09-18 DIAGNOSIS — Z85828 Personal history of other malignant neoplasm of skin: Secondary | ICD-10-CM | POA: Diagnosis not present

## 2020-09-21 MED ORDER — ORAL CARE MOUTH RINSE: 15.0000 mL | Freq: Once | OROMUCOSAL | Status: AC

## 2020-09-21 MED ORDER — SODIUM CHLORIDE 0.9 % IV SOLN: INTRAVENOUS | Status: DC

## 2020-09-21 MED ORDER — CHLORHEXIDINE GLUCONATE 0.12 % MT SOLN: 15.0000 mL | Freq: Once | OROMUCOSAL | Status: AC

## 2020-09-22 ENCOUNTER — Ambulatory Visit
Admission: RE | Admit: 2020-09-22 | Discharge: 2020-09-22 | Disposition: A | Discharge status: Home / Self Care | Payer: Medicare Other | Attending: General Surgery | Admitting: General Surgery

## 2020-09-22 ENCOUNTER — Ambulatory Visit: Payer: Medicare Other | Admitting: Urgent Care

## 2020-09-22 ENCOUNTER — Other Ambulatory Visit: Payer: Self-pay

## 2020-09-22 ENCOUNTER — Encounter
Admission: RE | Disposition: A | Discharge status: Home / Self Care | Payer: Self-pay | Source: Home / Self Care | Attending: General Surgery

## 2020-09-22 DIAGNOSIS — Z955 Presence of coronary angioplasty implant and graft: Secondary | ICD-10-CM | POA: Insufficient documentation

## 2020-09-22 DIAGNOSIS — A630 Anogenital (venereal) warts: Secondary | ICD-10-CM | POA: Insufficient documentation

## 2020-09-22 DIAGNOSIS — Z8616 Personal history of COVID-19: Secondary | ICD-10-CM | POA: Diagnosis not present

## 2020-09-22 DIAGNOSIS — E669 Obesity, unspecified: Secondary | ICD-10-CM | POA: Diagnosis not present

## 2020-09-22 DIAGNOSIS — Z7901 Long term (current) use of anticoagulants: Secondary | ICD-10-CM | POA: Diagnosis not present

## 2020-09-22 DIAGNOSIS — Z7984 Long term (current) use of oral hypoglycemic drugs: Secondary | ICD-10-CM | POA: Insufficient documentation

## 2020-09-22 DIAGNOSIS — Z6836 Body mass index (BMI) 36.0-36.9, adult: Secondary | ICD-10-CM | POA: Diagnosis not present

## 2020-09-22 DIAGNOSIS — E1122 Type 2 diabetes mellitus with diabetic chronic kidney disease: Secondary | ICD-10-CM | POA: Diagnosis not present

## 2020-09-22 DIAGNOSIS — I2721 Secondary pulmonary arterial hypertension: Secondary | ICD-10-CM | POA: Diagnosis not present

## 2020-09-22 DIAGNOSIS — N184 Chronic kidney disease, stage 4 (severe): Secondary | ICD-10-CM | POA: Diagnosis not present

## 2020-09-22 DIAGNOSIS — I130 Hypertensive heart and chronic kidney disease with heart failure and stage 1 through stage 4 chronic kidney disease, or unspecified chronic kidney disease: Secondary | ICD-10-CM | POA: Insufficient documentation

## 2020-09-22 DIAGNOSIS — I5032 Chronic diastolic (congestive) heart failure: Secondary | ICD-10-CM | POA: Diagnosis not present

## 2020-09-22 DIAGNOSIS — Z882 Allergy status to sulfonamides status: Secondary | ICD-10-CM | POA: Insufficient documentation

## 2020-09-22 DIAGNOSIS — K219 Gastro-esophageal reflux disease without esophagitis: Secondary | ICD-10-CM | POA: Diagnosis not present

## 2020-09-22 HISTORY — PX: WART FULGURATION: SHX5245

## 2020-09-22 LAB — GLUCOSE, CAPILLARY
Glucose-Capillary: 126 mg/dL — ABNORMAL HIGH (ref 70–99)
Glucose-Capillary: 113 mg/dL — ABNORMAL HIGH (ref 70–99)

## 2020-09-22 SURGERY — FULGURATION, WART, ANUS
Anesthesia: General | Site: Anus

## 2020-09-22 MED ORDER — ONDANSETRON HCL 4 MG/2ML IJ SOLN
INTRAMUSCULAR | Status: AC
  Filled 2020-09-22: qty 6

## 2020-09-22 MED ORDER — ONDANSETRON HCL 4 MG/2ML IJ SOLN
INTRAMUSCULAR | Status: DC | PRN
  Administered 2020-09-22: 4 mg via INTRAVENOUS

## 2020-09-22 MED ORDER — ACETAMINOPHEN 10 MG/ML IV SOLN
INTRAVENOUS | Status: AC
  Filled 2020-09-22: qty 100

## 2020-09-22 MED ORDER — HYDROCODONE-ACETAMINOPHEN 5-325 MG PO TABS: 1.0000 | ORAL_TABLET | ORAL | 0 refills | Status: DC | PRN

## 2020-09-22 MED ORDER — FENTANYL CITRATE (PF) 100 MCG/2ML IJ SOLN
INTRAMUSCULAR | Status: AC
  Filled 2020-09-22: qty 2

## 2020-09-22 MED ORDER — EPHEDRINE 5 MG/ML INJ
INTRAVENOUS | Status: AC
  Filled 2020-09-22: qty 10

## 2020-09-22 MED ORDER — GLYCOPYRROLATE 0.2 MG/ML IJ SOLN
INTRAMUSCULAR | Status: AC
  Filled 2020-09-22: qty 2

## 2020-09-22 MED ORDER — BUPIVACAINE LIPOSOME 1.3 % IJ SUSP
INTRAMUSCULAR | Status: DC | PRN
  Administered 2020-09-22: 20 mL

## 2020-09-22 MED ORDER — BUPIVACAINE-EPINEPHRINE (PF) 0.5% -1:200000 IJ SOLN
INTRAMUSCULAR | Status: AC
  Filled 2020-09-22: qty 30

## 2020-09-22 MED ORDER — MIDAZOLAM HCL 2 MG/2ML IJ SOLN
INTRAMUSCULAR | Status: AC
  Filled 2020-09-22: qty 2

## 2020-09-22 MED ORDER — PROPOFOL 10 MG/ML IV BOLUS
INTRAVENOUS | Status: DC | PRN
Start: 2020-09-22 — End: 2020-09-22
  Administered 2020-09-22: 90 mg via INTRAVENOUS
  Administered 2020-09-22: 50 mg via INTRAVENOUS

## 2020-09-22 MED ORDER — 0.9 % SODIUM CHLORIDE (POUR BTL) OPTIME
TOPICAL | Status: DC | PRN
  Administered 2020-09-22: 500 mL

## 2020-09-22 MED ORDER — LIDOCAINE HCL (CARDIAC) PF 100 MG/5ML IV SOSY
PREFILLED_SYRINGE | INTRAVENOUS | Status: DC | PRN
  Administered 2020-09-22: 100 mg via INTRAVENOUS

## 2020-09-22 MED ORDER — DROPERIDOL 2.5 MG/ML IJ SOLN
0.6250 mg | Freq: Once | INTRAMUSCULAR | Status: DC | PRN
  Filled 2020-09-22: qty 2

## 2020-09-22 MED ORDER — FENTANYL CITRATE (PF) 100 MCG/2ML IJ SOLN
INTRAMUSCULAR | Status: DC | PRN
  Administered 2020-09-22: 50 ug via INTRAVENOUS

## 2020-09-22 MED ORDER — ACETAMINOPHEN 10 MG/ML IV SOLN
INTRAVENOUS | Status: DC | PRN
  Administered 2020-09-22: 1000 mg via INTRAVENOUS

## 2020-09-22 MED ORDER — BACITRACIN ZINC 500 UNIT/GM EX OINT
TOPICAL_OINTMENT | CUTANEOUS | Status: DC | PRN
  Administered 2020-09-22: 1 via TOPICAL

## 2020-09-22 MED ORDER — DEXAMETHASONE SODIUM PHOSPHATE 10 MG/ML IJ SOLN
INTRAMUSCULAR | Status: AC
  Filled 2020-09-22: qty 1

## 2020-09-22 MED ORDER — CHLORHEXIDINE GLUCONATE 0.12 % MT SOLN
OROMUCOSAL | Status: AC
  Administered 2020-09-22: 15 mL via OROMUCOSAL
  Filled 2020-09-22: qty 15

## 2020-09-22 MED ORDER — BUPIVACAINE LIPOSOME 1.3 % IJ SUSP
INTRAMUSCULAR | Status: AC
  Filled 2020-09-22: qty 20

## 2020-09-22 MED ORDER — LACTATED RINGERS IV SOLN: INTRAVENOUS | Status: DC | PRN

## 2020-09-22 MED ORDER — ACETAMINOPHEN 10 MG/ML IV SOLN: 1000.0000 mg | Freq: Once | INTRAVENOUS | Status: DC | PRN

## 2020-09-22 MED ORDER — BACITRACIN ZINC 500 UNIT/GM EX OINT
TOPICAL_OINTMENT | CUTANEOUS | Status: AC
  Filled 2020-09-22: qty 28.35

## 2020-09-22 MED ORDER — MIDAZOLAM HCL 2 MG/2ML IJ SOLN
INTRAMUSCULAR | Status: DC | PRN
  Administered 2020-09-22 (×2): 1 mg via INTRAVENOUS

## 2020-09-22 MED ORDER — BUPIVACAINE HCL 0.25 % IJ SOLN
INTRAMUSCULAR | Status: DC | PRN
  Administered 2020-09-22: 20 mL

## 2020-09-22 MED ORDER — LIDOCAINE HCL (PF) 2 % IJ SOLN
INTRAMUSCULAR | Status: AC
  Filled 2020-09-22: qty 15

## 2020-09-22 SURGICAL SUPPLY — 36 items
ADAPTER SMOKE EVAC 7/8 TO 3/8 (ADAPTER) ×1 IMPLANT
BLADE SURG 15 STRL SS SAFETY (BLADE) ×2 IMPLANT
BRIEF STRETCH FOR OB PAD XXL (UNDERPADS AND DIAPERS) ×2 IMPLANT
CANISTER SUCT 1200ML W/VALVE (MISCELLANEOUS) ×1 IMPLANT
DRAPE LAPAROTOMY 100X77 ABD (DRAPES) ×2 IMPLANT
DRAPE LEGGINS SURG 28X43 STRL (DRAPES) ×2 IMPLANT
DRAPE UNDER BUTTOCK W/FLU (DRAPES) ×2 IMPLANT
DRSG GAUZE PETRO 6X36 STRIP ST (GAUZE/BANDAGES/DRESSINGS) ×2 IMPLANT
DRSG TELFA 4X3 1S NADH ST (GAUZE/BANDAGES/DRESSINGS) ×1 IMPLANT
ELECT REM PT RETURN 9FT ADLT (ELECTROSURGICAL) ×2
ELECTRODE REM PT RTRN 9FT ADLT (ELECTROSURGICAL) ×1 IMPLANT
GAUZE 4X4 16PLY ~~LOC~~+RFID DBL (SPONGE) ×2 IMPLANT
GLOVE SURG ENC MOIS LTX SZ7.5 (GLOVE) ×2 IMPLANT
GLOVE SURG UNDER LTX SZ8 (GLOVE) ×2 IMPLANT
GOWN STRL REUS W/ TWL LRG LVL3 (GOWN DISPOSABLE) ×2 IMPLANT
GOWN STRL REUS W/TWL LRG LVL3 (GOWN DISPOSABLE) ×4
KIT TURNOVER CYSTO (KITS) ×2 IMPLANT
LABEL OR SOLS (LABEL) ×1 IMPLANT
MANIFOLD NEPTUNE II (INSTRUMENTS) ×3 IMPLANT
NDL HYPO 25X1 1.5 SAFETY (NEEDLE) ×1 IMPLANT
NEEDLE HYPO 22GX1.5 SAFETY (NEEDLE) ×2 IMPLANT
NEEDLE HYPO 25X1 1.5 SAFETY (NEEDLE) ×2 IMPLANT
PACK BASIN MINOR ARMC (MISCELLANEOUS) ×2 IMPLANT
PAD OB MATERNITY 4.3X12.25 (PERSONAL CARE ITEMS) ×2 IMPLANT
PAD PREP 24X41 OB/GYN DISP (PERSONAL CARE ITEMS) ×2 IMPLANT
PENCIL SMOKE EVACUATOR (MISCELLANEOUS) ×2 IMPLANT
SHEARS FOC LG CVD HARMONIC 17C (MISCELLANEOUS) IMPLANT
SOL PREP PVP 2OZ (MISCELLANEOUS) ×4
SOLUTION PREP PVP 2OZ (MISCELLANEOUS) ×1 IMPLANT
SURGILUBE 2OZ TUBE FLIPTOP (MISCELLANEOUS) ×2 IMPLANT
SUT CHROMIC 3 0 SH 27 (SUTURE) IMPLANT
SUT SILK 0 CT 1 30 (SUTURE) IMPLANT
SUT VIC AB 3-0 SH 27 (SUTURE)
SUT VIC AB 3-0 SH 27X BRD (SUTURE) IMPLANT
SYR 10ML LL (SYRINGE) ×4 IMPLANT
TUBING SMOKE EVAC 6FT (TUBING) ×2 IMPLANT

## 2020-09-22 NOTE — Op Note (Signed)
Preoperative diagnosis: Perianal warts.  Postoperative diagnosis: Same.  Operative procedure: Excision of perianal warts.  Anoscopy.  Operating Surgeon: Hervey Ard, MD.  Anesthesia: General by LMA, Marcaine 0.25%, 20 cc mixed with Exparel, 20 cc local infiltration.  Estimated blood loss: Less than 5 cc.  Clinical note: This 70 year old woman has developed extensive perianal warts.  She is brought to the operating for planned excision.  Operative note: With the patient under adequate general anesthesia the area was cleansed with Betadine solution and draped.  The above mix of local anesthetics was infiltrated for postoperative analgesia.  The left-sided lesions were then excised with electrocautery.  This was slightly less extensive than on the right.  The left-sided samples were sent for pathologic review in formalin.  The right side was then approached and approximately 3 to 4 cm of skin denuded.  These were sent separate for pathologic review.  Anoscopy showed no evidence of disease in the anorectum.  Good hemostasis was noted.Bacitracin ointment was applied to the surgical sites. Peri-Pad applied.  Patient was taken to the recovery room in stable condition.

## 2020-09-22 NOTE — H&P (Signed)
Erica Larsen [id number] November 24, 1950     HPI:  Patient with condyloma. Symptomatic with bleeding.   Medications Prior to Admission  Medication Sig Dispense Refill Last Dose   amiodarone (PACERONE) 200 MG tablet Take 200 mg by mouth in the morning.   09/22/2020 at 0900   atorvastatin (LIPITOR) 80 MG tablet Take 80 mg by mouth daily after breakfast.   0 09/21/2020   Cholecalciferol (VITAMIN D3) 50 MCG (2000 UT) TABS Take 2,000 Units by mouth in the morning.   09/21/2020   Ferrous Sulfate (SLOW FE PO) Take 1 tablet by mouth in the morning.   09/21/2020   furosemide (LASIX) 40 MG tablet Take 40 mg by mouth in the morning.   09/21/2020   lansoprazole (PREVACID) 30 MG capsule Take 30 mg by mouth daily.   09/22/2020 at 0900   levothyroxine (SYNTHROID) 112 MCG tablet TAKE 1 TABLET(112 MCG) BY MOUTH DAILY (Patient taking differently: Take 112 mcg by mouth daily before breakfast. TAKE 1 TABLET(112 MCG) BY MOUTH DAILY) 90 tablet 4 09/21/2020   metoprolol succinate (TOPROL-XL) 100 MG 24 hr tablet Take 100 mg by mouth in the morning.   09/22/2020 at 0900   olmesartan (BENICAR) 40 MG tablet Take 40 mg by mouth daily.   09/21/2020   Polyethyl Glycol-Propyl Glycol (SYSTANE) 0.4-0.3 % SOLN Place 1 drop into both eyes in the morning and at bedtime.      raloxifene (EVISTA) 60 MG tablet TAKE 1 TABLET(60 MG) BY MOUTH DAILY (Patient taking differently: Take 60 mg by mouth daily. TAKE 1 TABLET(60 MG) BY MOUTH DAILY) 90 tablet 4    sitaGLIPtin (JANUVIA) 25 MG tablet TAKE 1 TABLET(25 MG) BY MOUTH DAILY (Patient taking differently: Take 25 mg by mouth daily. TAKE 1 TABLET(25 MG) BY MOUTH DAILY) 90 tablet 4 09/21/2020   vitamin B-12 (CYANOCOBALAMIN) 1000 MCG tablet Take 1,000 mcg by mouth in the morning.   09/21/2020   XARELTO 20 MG TABS tablet Take 20 mg by mouth in the morning.   09/21/2020   Allergies  Allergen Reactions   Sulfa Antibiotics Anaphylaxis and Swelling   Latex Rash   Past Medical History:  Diagnosis Date   A-fib ([place])     Anemia of chronic disease 2017   Chronic diastolic HF (heart failure) (HCC)    CKD (chronic kidney disease), stage IV (HCC)    Coronary artery disease    a.) PCI on 12/05/2006 - 80% stenosis mLAD and 70% stenosis RCA; Xience V 2.75 x 23 mm DES to mLAD    COVID-19 virus detected 08/14/2018   Diverticulosis    Esophagitis    GERD (gastroesophageal reflux disease)    Grade III diastolic dysfunction    Hiatal hernia    High cholesterol    Hypercalcemia    Hypertension    Hypothyroidism    LBBB (left bundle branch block)    Lumbar radiculopathy    Obesity    Osteoarthritis    Osteopenia    Hips   PAH (pulmonary artery hypertension) (HCC)    mild per 07/14/2018 TTE   Pneumonia    T2DM (type 2 diabetes mellitus) ([place])    Valvular insufficiency    a. MODERATE PV, TV, MV regurgitation on 07/14/2018 TTE   Past Surgical History:  Procedure Laterality Date   CATARACT EXTRACTION Bilateral    May - June 2018   COLONOSCOPY WITH PROPOFOL N/A 08/01/2015   Procedure: COLONOSCOPY WITH PROPOFOL;  Surgeon: Lucilla Lame, MD;  Location: [facility] ENDOSCOPY;  Service: Endoscopy;  Laterality: N/A;   CORONARY ANGIOPLASTY WITH STENT PLACEMENT  12/05/2006   80% stenosis mLAD and 70% stenosis RCA; Xience V 2.75 x 23 mm DES to mLAD    ESOPHAGOGASTRODUODENOSCOPY (EGD) WITH PROPOFOL N/A 05/14/2016   Procedure: ESOPHAGOGASTRODUODENOSCOPY (EGD) WITH PROPOFOL;  Surgeon: Lucilla Lame, MD;  Location: [facility] ENDOSCOPY;  Service: Endoscopy;  Laterality: N/A;   EYE SURGERY     GIVENS CAPSULE STUDY N/A 06/01/2015   Procedure: GIVENS CAPSULE STUDY;  Surgeon: Lucilla Lame, MD;  Location: [facility] ENDOSCOPY;  Service: Endoscopy;  Laterality: N/A;   HIATAL HERNIA REPAIR N/A 09/24/2016   Procedure: LAPAROSCOPIC REPAIR OF HIATAL HERNIA WITH FUNDOPLICATION WITH MESH (BIO-A);  Surgeon: Jules Husbands, MD;  Location: [facility] ORS;  Service: General;  Laterality: N/A;  Lithotomy position w arms tucked.    pre cancer removed     left forearm    squamous cell carcinoma surgery     back, forehead, left forearm   Social History   Socioeconomic History   Marital status: Married    Spouse name: [name]   Number of children: 2   Years of education: Not on file   Highest education level: Not on file  Occupational History   Occupation: retired  Tobacco Use   Smoking status: Never   Smokeless tobacco: Never  Vaping Use   Vaping Use: Never used  Substance and Sexual Activity   Alcohol use: Yes    Comment: occassional   Drug use: No   Sexual activity: Yes    Birth control/protection: Post-menopausal  Other Topics Concern   Not on file  Social History Narrative   Not on file   Social Determinants of Health   Financial Resource Strain: Low Risk    Difficulty of Paying Living Expenses: Not hard at all  Food Insecurity: No Food Insecurity   Worried About Charity fundraiser in the Last Year: Never true   [place] in the Last Year: Never true  Transportation Needs: No Transportation Needs   Lack of Transportation (Medical): No   Lack of Transportation (Non-Medical): No  Physical Activity: Inactive   Days of Exercise per Week: 0 days   Minutes of Exercise per Session: 0 min  Stress: No Stress Concern Present   Feeling of Stress : Not at all  Social Connections: Not on file  Intimate Partner Violence: Not on file   Social History   Social History Narrative   Not on file     ROS: Negative.     PE: HEENT: Negative. Lungs: Clear. Cardio: RR.   Assessment/Plan:  Proceed with planned excision of condyloma.   Forest Gleason [facility] 09/22/2020

## 2020-09-22 NOTE — Transfer of Care (Signed)
Immediate Anesthesia Transfer of Care Note  Patient: Erica Larsen  Procedure(s) Performed: FULGURATION ANAL WART (Anus)  Patient Location: PACU  Anesthesia Type:General  Level of Consciousness: awake  Airway & Oxygen Therapy: Patient Spontanous Breathing  Post-op Assessment: Report given to RN  Post vital signs: stable  Last Vitals:  Vitals Value Taken Time  BP 151/64 09/22/20 1432  Temp    Pulse 48 09/22/20 1436  Resp 12 09/22/20 1436  SpO2 97 % 09/22/20 1436  Vitals shown include unvalidated device data.  Last Pain:  Vitals:   09/22/20 1135  TempSrc: Temporal  PainSc: 0-No pain         Complications: No notable events documented.

## 2020-09-22 NOTE — Anesthesia Procedure Notes (Signed)
Procedure Name: LMA Insertion Date/Time: 09/22/2020 1:49 PM Performed by: Leander Rams, CRNA Pre-anesthesia Checklist: Patient identified, Emergency Drugs available, Suction available and Patient being monitored Patient Re-evaluated:Patient Re-evaluated prior to induction Oxygen Delivery Method: Circle system utilized Preoxygenation: Pre-oxygenation with 100% oxygen Induction Type: IV induction LMA: LMA inserted LMA Size: 4.0 Number of attempts: 1 Tube secured with: Tape Dental Injury: Teeth and Oropharynx as per pre-operative assessment

## 2020-09-22 NOTE — Discharge Instructions (Addendum)
AMBULATORY SURGERY  DISCHARGE INSTRUCTIONS   The drugs that you were given will stay in your system until tomorrow so for the next 24 hours you should not:  Drive an automobile Make any legal decisions Drink any alcoholic beverage   You may resume regular meals tomorrow.  Today it is better to start with liquids and gradually work up to solid foods.  You may eat anything you prefer, but it is better to start with liquids, then soup and crackers, and gradually work up to solid foods.   Please notify your doctor immediately if you have any unusual bleeding, trouble breathing, redness and pain at the surgery site, drainage, fever, or pain not relieved by medication.    Additional Instructions: Keep green band on for 4 days due to long acting numbing medicine  Please contact your physician with any problems or Same Day Surgery at [phone], Monday through Friday 6 am to 4 pm, or [place] at [facility] number at [phone].

## 2020-09-22 NOTE — Anesthesia Preprocedure Evaluation (Addendum)
Anesthesia Evaluation  Patient identified by MRN, date of birth, ID band Patient awake    Reviewed: Allergy & Precautions, H&P , NPO status , Patient's Chart, lab work & pertinent test results, reviewed documented beta blocker date and time   History of Anesthesia Complications Negative for: history of anesthetic complications  Airway Mallampati: III  TM Distance: >3 FB Neck ROM: full    Dental  (+) Caps, Missing,    Pulmonary neg pulmonary ROS,    Pulmonary exam normal        Cardiovascular Exercise Tolerance: Poor hypertension, Pt. on home beta blockers and Pt. on medications (-) angina+ CAD, + Cardiac Stents, +CHF and + DOE  (-) Past MI and (-) CABG (-) dysrhythmias + Valvular Problems/Murmurs MR  Rhythm:Regular Rate:Normal + Peripheral Edema    Neuro/Psych negative neurological ROS  negative psych ROS   GI/Hepatic Neg liver ROS, hiatal hernia, GERD  Controlled,  Endo/Other  diabetes, Well Controlled, Type 2, Oral Hypoglycemic AgentsMorbid obesity  Renal/GU CRFRenal disease  negative genitourinary   Musculoskeletal  (+) Arthritis , Osteoarthritis,    Abdominal (+) + obese,   Peds  Hematology  (+) Blood dyscrasia, anemia ,   Anesthesia Other Findings Past Medical History: No date: Arthritis     Comment: osteoarthritis No date: CHF (congestive heart failure) (HCC) No date: Coronary artery disease No date: Diabetes mellitus without complication (HCC) No date: Diverticulosis No date: Esophagitis No date: GERD (gastroesophageal reflux disease) No date: High cholesterol No date: Hypercalcemia No date: Hypertension No date: Lumbar radiculopathy No date: Obesity No date: Osteopenia     Comment: Hips No date: Thyroid disease   Reproductive/Obstetrics negative OB ROS                            Anesthesia Physical  Anesthesia Plan  ASA: III  Anesthesia Plan: General   Post-op  Pain Management:    Induction: Intravenous  PONV Risk Score and Plan: 2 and Ondansetron and Dexamethasone  Airway Management Planned: LMA  Additional Equipment:   Intra-op Plan:   Post-operative Plan: Extubation in OR  Informed Consent: I have reviewed the patients History and Physical, chart, labs and discussed the procedure including the risks, benefits and alternatives for the proposed anesthesia with the patient or authorized representative who has indicated his/her understanding and acceptance.     Dental Advisory Given and Dental advisory given  Plan Discussed with: Anesthesiologist, CRNA and Surgeon  Anesthesia Plan Comments:         Anesthesia Quick Evaluation

## 2020-09-25 ENCOUNTER — Encounter: Payer: Self-pay | Admitting: General Surgery

## 2020-09-25 NOTE — Anesthesia Postprocedure Evaluation (Signed)
Anesthesia Post Note  Patient: Erica Larsen  Procedure(s) Performed: FULGURATION ANAL WART (Anus)  Patient location during evaluation: PACU Anesthesia Type: General Level of consciousness: awake and alert Pain management: pain level controlled Vital Signs Assessment: post-procedure vital signs reviewed and stable Respiratory status: spontaneous breathing, nonlabored ventilation and respiratory function stable Cardiovascular status: blood pressure returned to baseline and stable Postop Assessment: no apparent nausea or vomiting Anesthetic complications: no   No notable events documented.   Last Vitals:  Vitals:   09/22/20 1511 09/22/20 1539  BP: (!) 156/59 (!) 146/59  Pulse: (!) 47 (!) 52  Resp: 12 16  Temp: (!) 36.2 C (!) 36.4 C  SpO2: 97% 99%    Last Pain:  Vitals:   09/22/20 1539  TempSrc: Temporal  PainSc: 0-No pain                 [place] Ouch

## 2020-09-27 LAB — SURGICAL PATHOLOGY

## 2020-10-05 ENCOUNTER — Other Ambulatory Visit: Payer: Self-pay | Admitting: Nurse Practitioner

## 2020-10-05 NOTE — Telephone Encounter (Signed)
Requested medication (s) are due for refill today: historical med  Requested medication (s) are on the active medication list: yes  Last refill:  na  Future visit scheduled: yes in 2 months  Notes to clinic:  historical medication. Do you want to order Rx?     Requested Prescriptions  Pending Prescriptions Disp Refills   lansoprazole (PREVACID) 30 MG capsule [Pharmacy Med Name: LANSOPRAZOLE 30MG  DR CAPSULES] 90 capsule     Sig: TAKE ONE CAPSULE BY MOUTH DAILY AT 12:00 NOON     Gastroenterology: Proton Pump Inhibitors Passed - 10/05/2020  2:52 PM      Passed - Valid encounter within last 12 months    Recent Outpatient Visits           1 month ago Type 2 diabetes mellitus with stage 4 chronic kidney disease, without long-term current use of insulin ([place])   [place], [place] T, NP   4 months ago [place], [place] T, NP   6 months ago Type 2 diabetes mellitus with stage 4 chronic kidney disease, without long-term current use of insulin ([place])   [place], Jolene T, NP   9 months ago Type 2 diabetes mellitus with stage 4 chronic kidney disease, without long-term current use of insulin ([place])   [place], Jolene T, NP   1 year ago Type 2 diabetes mellitus with stage 4 chronic kidney disease, without long-term current use of insulin ([place])   [place], Barbaraann Faster, NP       Future Appointments             In 2 months [place], Barbaraann Faster, NP [employer], [place]   In 2 months [place], [place], MD [place]   In 9 months  [employer], [place]

## 2020-10-17 ENCOUNTER — Other Ambulatory Visit: Payer: Self-pay | Admitting: Nurse Practitioner

## 2020-10-17 DIAGNOSIS — M85851 Other specified disorders of bone density and structure, right thigh: Secondary | ICD-10-CM

## 2020-11-28 DIAGNOSIS — E1122 Type 2 diabetes mellitus with diabetic chronic kidney disease: Secondary | ICD-10-CM | POA: Diagnosis not present

## 2020-11-28 DIAGNOSIS — N184 Chronic kidney disease, stage 4 (severe): Secondary | ICD-10-CM | POA: Diagnosis not present

## 2020-11-28 DIAGNOSIS — I10 Essential (primary) hypertension: Secondary | ICD-10-CM | POA: Diagnosis not present

## 2020-11-28 DIAGNOSIS — R600 Localized edema: Secondary | ICD-10-CM | POA: Diagnosis not present

## 2020-11-28 DIAGNOSIS — R809 Proteinuria, unspecified: Secondary | ICD-10-CM | POA: Diagnosis not present

## 2020-12-02 ENCOUNTER — Encounter: Payer: Self-pay | Admitting: Nurse Practitioner

## 2020-12-02 DIAGNOSIS — D696 Thrombocytopenia, unspecified: Secondary | ICD-10-CM | POA: Insufficient documentation

## 2020-12-04 DIAGNOSIS — N1832 Chronic kidney disease, stage 3b: Secondary | ICD-10-CM | POA: Diagnosis not present

## 2020-12-04 DIAGNOSIS — N2581 Secondary hyperparathyroidism of renal origin: Secondary | ICD-10-CM | POA: Diagnosis not present

## 2020-12-04 DIAGNOSIS — I10 Essential (primary) hypertension: Secondary | ICD-10-CM | POA: Diagnosis not present

## 2020-12-04 DIAGNOSIS — E1122 Type 2 diabetes mellitus with diabetic chronic kidney disease: Secondary | ICD-10-CM | POA: Diagnosis not present

## 2020-12-04 DIAGNOSIS — R600 Localized edema: Secondary | ICD-10-CM | POA: Diagnosis not present

## 2020-12-06 ENCOUNTER — Ambulatory Visit: Payer: Medicare Other | Admitting: Nurse Practitioner

## 2020-12-07 ENCOUNTER — Ambulatory Visit: Payer: Medicare Other | Admitting: Nurse Practitioner

## 2020-12-13 ENCOUNTER — Ambulatory Visit: Payer: PRIVATE HEALTH INSURANCE | Admitting: Dermatology

## 2020-12-28 DIAGNOSIS — E782 Mixed hyperlipidemia: Secondary | ICD-10-CM | POA: Diagnosis not present

## 2020-12-28 DIAGNOSIS — I2510 Atherosclerotic heart disease of native coronary artery without angina pectoris: Secondary | ICD-10-CM | POA: Diagnosis not present

## 2020-12-28 DIAGNOSIS — I340 Nonrheumatic mitral (valve) insufficiency: Secondary | ICD-10-CM | POA: Diagnosis not present

## 2020-12-28 DIAGNOSIS — I10 Essential (primary) hypertension: Secondary | ICD-10-CM | POA: Diagnosis not present

## 2021-01-18 ENCOUNTER — Other Ambulatory Visit: Payer: Self-pay | Admitting: Nurse Practitioner

## 2021-01-18 DIAGNOSIS — E039 Hypothyroidism, unspecified: Secondary | ICD-10-CM

## 2021-01-18 NOTE — Telephone Encounter (Signed)
Patient called in says was only given 8 pills from the pharmacy , of levothyroxine (SYNTHROID) 112 MCG tablet . She is wanting to know why.

## 2021-01-20 NOTE — Telephone Encounter (Signed)
Last RF 04/06/20 #90 4 RF should have enough to last until 2/23. Attempted to call pharmacy but the hold was too long. Active med and no future visit due. No lab needed until 2/23 Requested Prescriptions  Pending Prescriptions Disp Refills   levothyroxine (SYNTHROID) 112 MCG tablet 90 tablet 4    Sig: TAKE 1 TABLET(112 MCG) BY MOUTH DAILY     Endocrinology:  Hypothyroid Agents Failed - 01/18/2021  9:37 PM      Failed - TSH needs to be rechecked within 3 months after an abnormal result. Refill until TSH is due.      Passed - TSH in normal range and within 360 days    TSH  Date Value Ref Range Status  04/06/2020 2.000 0.450 - 4.500 uIU/mL Final          Passed - Valid encounter within last 12 months    Recent Outpatient Visits           4 months ago Type 2 diabetes mellitus with stage 4 chronic kidney disease, without long-term current use of insulin ([place])   [place], [place] T, NP   8 months ago [place], [place] T, NP   9 months ago Type 2 diabetes mellitus with stage 4 chronic kidney disease, without long-term current use of insulin ([place])   [place], [place] T, NP   1 year ago Type 2 diabetes mellitus with stage 4 chronic kidney disease, without long-term current use of insulin ([place])   [place], [place] T, NP   1 year ago Type 2 diabetes mellitus with stage 4 chronic kidney disease, without long-term current use of insulin ([place])   [place], Barbaraann Faster, NP       Future Appointments             In 5 months [place], [facility]

## 2021-01-22 MED ORDER — LEVOTHYROXINE SODIUM 112 MCG PO TABS: ORAL_TABLET | ORAL | 4 refills | Status: DC

## 2021-02-14 ENCOUNTER — Other Ambulatory Visit: Payer: Self-pay | Admitting: *Deleted

## 2021-02-14 DIAGNOSIS — D631 Anemia in chronic kidney disease: Secondary | ICD-10-CM

## 2021-02-14 DIAGNOSIS — D510 Vitamin B12 deficiency anemia due to intrinsic factor deficiency: Secondary | ICD-10-CM

## 2021-02-14 DIAGNOSIS — D500 Iron deficiency anemia secondary to blood loss (chronic): Secondary | ICD-10-CM

## 2021-02-21 ENCOUNTER — Encounter: Payer: Self-pay | Admitting: Internal Medicine

## 2021-02-21 ENCOUNTER — Inpatient Hospital Stay: Payer: Medicare Other | Attending: Internal Medicine

## 2021-02-21 ENCOUNTER — Other Ambulatory Visit: Payer: Self-pay

## 2021-02-21 DIAGNOSIS — I509 Heart failure, unspecified: Secondary | ICD-10-CM | POA: Diagnosis not present

## 2021-02-21 DIAGNOSIS — E1122 Type 2 diabetes mellitus with diabetic chronic kidney disease: Secondary | ICD-10-CM | POA: Diagnosis not present

## 2021-02-21 DIAGNOSIS — D631 Anemia in chronic kidney disease: Secondary | ICD-10-CM | POA: Insufficient documentation

## 2021-02-21 DIAGNOSIS — E7800 Pure hypercholesterolemia, unspecified: Secondary | ICD-10-CM | POA: Insufficient documentation

## 2021-02-21 DIAGNOSIS — Z803 Family history of malignant neoplasm of breast: Secondary | ICD-10-CM | POA: Diagnosis not present

## 2021-02-21 DIAGNOSIS — I130 Hypertensive heart and chronic kidney disease with heart failure and stage 1 through stage 4 chronic kidney disease, or unspecified chronic kidney disease: Secondary | ICD-10-CM | POA: Insufficient documentation

## 2021-02-21 DIAGNOSIS — I2510 Atherosclerotic heart disease of native coronary artery without angina pectoris: Secondary | ICD-10-CM | POA: Diagnosis not present

## 2021-02-21 DIAGNOSIS — M8580 Other specified disorders of bone density and structure, unspecified site: Secondary | ICD-10-CM | POA: Insufficient documentation

## 2021-02-21 DIAGNOSIS — D509 Iron deficiency anemia, unspecified: Secondary | ICD-10-CM | POA: Insufficient documentation

## 2021-02-21 DIAGNOSIS — D693 Immune thrombocytopenic purpura: Secondary | ICD-10-CM | POA: Diagnosis not present

## 2021-02-21 DIAGNOSIS — I2721 Secondary pulmonary arterial hypertension: Secondary | ICD-10-CM | POA: Insufficient documentation

## 2021-02-21 DIAGNOSIS — Z7901 Long term (current) use of anticoagulants: Secondary | ICD-10-CM | POA: Diagnosis not present

## 2021-02-21 DIAGNOSIS — K219 Gastro-esophageal reflux disease without esophagitis: Secondary | ICD-10-CM | POA: Diagnosis not present

## 2021-02-21 DIAGNOSIS — N184 Chronic kidney disease, stage 4 (severe): Secondary | ICD-10-CM | POA: Diagnosis not present

## 2021-02-21 DIAGNOSIS — E039 Hypothyroidism, unspecified: Secondary | ICD-10-CM | POA: Insufficient documentation

## 2021-02-21 DIAGNOSIS — K449 Diaphragmatic hernia without obstruction or gangrene: Secondary | ICD-10-CM | POA: Insufficient documentation

## 2021-02-21 DIAGNOSIS — Z7984 Long term (current) use of oral hypoglycemic drugs: Secondary | ICD-10-CM | POA: Insufficient documentation

## 2021-02-21 DIAGNOSIS — I5032 Chronic diastolic (congestive) heart failure: Secondary | ICD-10-CM | POA: Insufficient documentation

## 2021-02-21 DIAGNOSIS — D500 Iron deficiency anemia secondary to blood loss (chronic): Secondary | ICD-10-CM

## 2021-02-21 DIAGNOSIS — Z79899 Other long term (current) drug therapy: Secondary | ICD-10-CM | POA: Insufficient documentation

## 2021-02-21 DIAGNOSIS — I4891 Unspecified atrial fibrillation: Secondary | ICD-10-CM | POA: Diagnosis not present

## 2021-02-21 DIAGNOSIS — Z8616 Personal history of COVID-19: Secondary | ICD-10-CM | POA: Insufficient documentation

## 2021-02-21 DIAGNOSIS — D510 Vitamin B12 deficiency anemia due to intrinsic factor deficiency: Secondary | ICD-10-CM

## 2021-02-21 LAB — BASIC METABOLIC PANEL
Anion gap: 7 (ref 5–15)
BUN: 14 mg/dL (ref 8–23)
CO2: 25 mmol/L (ref 22–32)
Calcium: 8.4 mg/dL — ABNORMAL LOW (ref 8.9–10.3)
Chloride: 105 mmol/L (ref 98–111)
Creatinine, Ser: 1.58 mg/dL — ABNORMAL HIGH (ref 0.44–1.00)
GFR, Estimated: 35 mL/min — ABNORMAL LOW (ref >60)
Glucose, Bld: 138 mg/dL — ABNORMAL HIGH (ref 70–99)
Potassium: 3.6 mmol/L (ref 3.5–5.1)
Sodium: 137 mmol/L (ref 135–145)

## 2021-02-21 LAB — CBC WITH DIFFERENTIAL/PLATELET
Abs Immature Granulocytes: 0.03 10*3/uL (ref 0.00–0.07)
Basophils Absolute: 0 10*3/uL (ref 0.0–0.1)
Basophils Relative: 0 %
Eosinophils Absolute: 0.2 10*3/uL (ref 0.0–0.5)
Eosinophils Relative: 2 %
HCT: 31.1 % — ABNORMAL LOW (ref 36.0–46.0)
Hemoglobin: 9.5 g/dL — ABNORMAL LOW (ref 12.0–15.0)
Immature Granulocytes: 0 %
Lymphocytes Relative: 18 %
Lymphs Abs: 1.3 10*3/uL (ref 0.7–4.0)
MCH: 30.5 pg (ref 26.0–34.0)
MCHC: 30.5 g/dL (ref 30.0–36.0)
MCV: 100 fL (ref 80.0–100.0)
Monocytes Absolute: 0.8 10*3/uL (ref 0.1–1.0)
Monocytes Relative: 11 %
Neutro Abs: 5.2 10*3/uL (ref 1.7–7.7)
Neutrophils Relative %: 69 %
Platelets: 150 10*3/uL (ref 150–400)
RBC: 3.11 MIL/uL — ABNORMAL LOW (ref 3.87–5.11)
RDW: 17.2 % — ABNORMAL HIGH (ref 11.5–15.5)
WBC: 7.5 10*3/uL (ref 4.0–10.5)
nRBC: 0 % (ref 0.0–0.2)

## 2021-02-21 LAB — VITAMIN B12: Vitamin B-12: 503 pg/mL (ref 180–914)

## 2021-02-22 LAB — IRON AND TIBC
Iron: 35 ug/dL (ref 28–170)
Saturation Ratios: 7 % — ABNORMAL LOW (ref 10.4–31.8)
TIBC: 470 ug/dL — ABNORMAL HIGH (ref 250–450)
UIBC: 435 ug/dL

## 2021-02-22 LAB — FERRITIN: Ferritin: 57 ng/mL (ref 11–307)

## 2021-02-23 ENCOUNTER — Inpatient Hospital Stay: Payer: Medicare Other

## 2021-02-23 ENCOUNTER — Inpatient Hospital Stay (HOSPITAL_BASED_OUTPATIENT_CLINIC_OR_DEPARTMENT_OTHER): Payer: Medicare Other | Admitting: Internal Medicine

## 2021-02-23 ENCOUNTER — Other Ambulatory Visit: Payer: Self-pay

## 2021-02-23 ENCOUNTER — Encounter: Payer: Self-pay | Admitting: Internal Medicine

## 2021-02-23 VITALS — BP 140/59 | HR 48

## 2021-02-23 DIAGNOSIS — E7800 Pure hypercholesterolemia, unspecified: Secondary | ICD-10-CM | POA: Diagnosis not present

## 2021-02-23 DIAGNOSIS — N184 Chronic kidney disease, stage 4 (severe): Secondary | ICD-10-CM | POA: Diagnosis not present

## 2021-02-23 DIAGNOSIS — Z8616 Personal history of COVID-19: Secondary | ICD-10-CM | POA: Diagnosis not present

## 2021-02-23 DIAGNOSIS — K219 Gastro-esophageal reflux disease without esophagitis: Secondary | ICD-10-CM | POA: Diagnosis not present

## 2021-02-23 DIAGNOSIS — D631 Anemia in chronic kidney disease: Secondary | ICD-10-CM | POA: Diagnosis not present

## 2021-02-23 DIAGNOSIS — D693 Immune thrombocytopenic purpura: Secondary | ICD-10-CM | POA: Diagnosis not present

## 2021-02-23 DIAGNOSIS — D509 Iron deficiency anemia, unspecified: Secondary | ICD-10-CM | POA: Diagnosis not present

## 2021-02-23 DIAGNOSIS — E039 Hypothyroidism, unspecified: Secondary | ICD-10-CM | POA: Diagnosis not present

## 2021-02-23 DIAGNOSIS — I5032 Chronic diastolic (congestive) heart failure: Secondary | ICD-10-CM | POA: Diagnosis not present

## 2021-02-23 DIAGNOSIS — Z7984 Long term (current) use of oral hypoglycemic drugs: Secondary | ICD-10-CM | POA: Diagnosis not present

## 2021-02-23 DIAGNOSIS — K449 Diaphragmatic hernia without obstruction or gangrene: Secondary | ICD-10-CM | POA: Diagnosis not present

## 2021-02-23 DIAGNOSIS — Z79899 Other long term (current) drug therapy: Secondary | ICD-10-CM | POA: Diagnosis not present

## 2021-02-23 DIAGNOSIS — I4891 Unspecified atrial fibrillation: Secondary | ICD-10-CM | POA: Diagnosis not present

## 2021-02-23 DIAGNOSIS — I130 Hypertensive heart and chronic kidney disease with heart failure and stage 1 through stage 4 chronic kidney disease, or unspecified chronic kidney disease: Secondary | ICD-10-CM | POA: Diagnosis not present

## 2021-02-23 DIAGNOSIS — I2721 Secondary pulmonary arterial hypertension: Secondary | ICD-10-CM | POA: Diagnosis not present

## 2021-02-23 DIAGNOSIS — I2510 Atherosclerotic heart disease of native coronary artery without angina pectoris: Secondary | ICD-10-CM | POA: Diagnosis not present

## 2021-02-23 DIAGNOSIS — Z803 Family history of malignant neoplasm of breast: Secondary | ICD-10-CM | POA: Diagnosis not present

## 2021-02-23 DIAGNOSIS — D500 Iron deficiency anemia secondary to blood loss (chronic): Secondary | ICD-10-CM | POA: Diagnosis not present

## 2021-02-23 DIAGNOSIS — E1122 Type 2 diabetes mellitus with diabetic chronic kidney disease: Secondary | ICD-10-CM | POA: Diagnosis not present

## 2021-02-23 DIAGNOSIS — Z7901 Long term (current) use of anticoagulants: Secondary | ICD-10-CM | POA: Diagnosis not present

## 2021-02-23 DIAGNOSIS — M8580 Other specified disorders of bone density and structure, unspecified site: Secondary | ICD-10-CM | POA: Diagnosis not present

## 2021-02-23 DIAGNOSIS — I509 Heart failure, unspecified: Secondary | ICD-10-CM | POA: Diagnosis not present

## 2021-02-23 MED ORDER — SODIUM CHLORIDE 0.9 % IV SOLN
Freq: Once | INTRAVENOUS | Status: AC
  Filled 2021-02-23: qty 250

## 2021-02-23 MED ORDER — SODIUM CHLORIDE 0.9 % IV SOLN
510.0000 mg | Freq: Once | INTRAVENOUS | Status: AC
  Administered 2021-02-23: 510 mg via INTRAVENOUS
  Filled 2021-02-23: qty 17

## 2021-02-23 NOTE — Assessment & Plan Note (Addendum)
Iron deficiency anemia- unclear etiology; question GI blood loss/CKD [hx large hiatal hernia status post fundoplication].  Hemoglobin today is 9.5 ; iron sat- 7%%; ferertin-57 Proceed with infusion today.   # B12 deficiency-low B12-June 2021; continue sublingual B12 for B12 injection  # CHF [Dr.Khan]/ CKD-IV sec to DM [Dr.Singh]  # ITP- Mild thrombocytopenia platelets 150 todayintermittent [2018]- STABLE  # DISPOSITION:   # IV Ferrahem today; NO B12 injection.  # Ferrahem monthly x 2- start next month  #  follow up in 4 months /possible ferrahem-lasb- cbc/bmp-possible ferrahem Dr.B  Cc; Dr.Khan/Singh

## 2021-02-23 NOTE — Progress Notes (Signed)
[facility] NOTE  Patient Care Team: Venita Lick, NP as PCP - General (Nurse Practitioner) Dionisio David, MD as Consulting Physician (Cardiology) Cammie Sickle, MD as Consulting Physician (Internal Medicine)  CHIEF COMPLAINTS/PURPOSE OF CONSULTATION:   # 2014- Iron def Anemia-  [Sep 2014 EGD/COLO- NEG/ferritin 6; Dr.Wohl]. 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   # Hx CHF/CAD- asprin-plavix; Sep 2018- s/p hiatal hernia-fundoplication [Dr.Pabon]  HISTORY OF PRESENTING ILLNESS: Ambulating independently.  Alone. Erica Larsen 71 y.o.  female patient with above history of iron deficiency anemia-secondary to CKD CHF on intermittent Feraheme is here for follow-up.  Patient denies any blood in stools or black or stools.  No nausea or vomiting.  Complains of extreme fatigue. "Sleeping all the time". Denies any swelling in the legs.  .Review of Systems  Constitutional:  Positive for malaise/fatigue. Negative for chills, diaphoresis, fever and weight loss.  HENT:  Negative for nosebleeds and sore throat.   Eyes:  Negative for double vision.  Respiratory:  Negative for cough, hemoptysis, sputum production, shortness of breath and wheezing.   Cardiovascular:  Negative for chest pain, palpitations, orthopnea and leg swelling.  Gastrointestinal:  Negative for abdominal pain, blood in stool, constipation, diarrhea, heartburn, melena, nausea and vomiting.  Genitourinary:  Negative for dysuria, frequency and urgency.  Musculoskeletal:  Positive for back pain and joint pain.  Skin: Negative.  Negative for itching and rash.  Neurological:  Negative for dizziness, tingling, focal weakness, weakness and headaches.  Endo/Heme/Allergies:  Does not bruise/bleed easily.  Psychiatric/Behavioral:  Negative for depression. The patient is not nervous/anxious  and does not have insomnia.     MEDICAL HISTORY:  Past Medical History:  Diagnosis Date   A-fib [facility])    Anemia of chronic disease 2017   Chronic diastolic HF (heart failure) (HCC)    CKD (chronic kidney disease), stage IV (HCC)    Coronary artery disease    a.) PCI on 12/05/2006 - 80% stenosis mLAD and 70% stenosis RCA; Xience V 2.75 x 23 mm DES to mLAD    COVID-19 virus detected 08/14/2018   Diverticulosis    Esophagitis    GERD (gastroesophageal reflux disease)    Grade III diastolic dysfunction    Hiatal hernia    High cholesterol    Hypercalcemia    Hypertension    Hypothyroidism    LBBB (left bundle branch block)    Lumbar radiculopathy    Obesity    Osteoarthritis    Osteopenia    Hips   PAH (pulmonary artery hypertension) (HCC)    mild per 07/14/2018 TTE   Pneumonia    T2DM (type 2 diabetes mellitus) ([place])    Valvular insufficiency    a. MODERATE PV, TV, MV regurgitation on 07/14/2018 TTE    SURGICAL HISTORY: Past Surgical History:  Procedure Laterality Date   CATARACT EXTRACTION Bilateral    May - June 2018   COLONOSCOPY WITH PROPOFOL N/A 08/01/2015   Procedure: COLONOSCOPY WITH PROPOFOL;  Surgeon: Lucilla Lame, MD;  Location: [facility] ENDOSCOPY;  Service: Endoscopy;  Laterality: N/A;   CORONARY ANGIOPLASTY WITH STENT PLACEMENT  12/05/2006   80% stenosis mLAD and 70% stenosis RCA; Xience V 2.75 x 23 mm DES to mLAD    ESOPHAGOGASTRODUODENOSCOPY (EGD) WITH PROPOFOL N/A 05/14/2016   Procedure: ESOPHAGOGASTRODUODENOSCOPY (EGD) WITH PROPOFOL;  Surgeon: Lucilla Lame, MD;  Location: [facility] ENDOSCOPY;  Service: Endoscopy;  Laterality: N/A;  EYE SURGERY     GIVENS CAPSULE STUDY N/A 06/01/2015   Procedure: GIVENS CAPSULE STUDY;  Surgeon: Lucilla Lame, MD;  Location: [facility] ENDOSCOPY;  Service: Endoscopy;  Laterality: N/A;   HIATAL HERNIA REPAIR N/A 09/24/2016   Procedure: LAPAROSCOPIC REPAIR OF HIATAL HERNIA WITH FUNDOPLICATION WITH MESH (BIO-A);  Surgeon: Jules Husbands, MD;   Location: [facility] ORS;  Service: General;  Laterality: N/A;  Lithotomy position w arms tucked.    pre cancer removed     left forearm   squamous cell carcinoma surgery     back, forehead, left forearm   WART FULGURATION N/A 09/22/2020   Procedure: FULGURATION ANAL WART;  Surgeon: Robert Bellow, MD;  Location: [facility] ORS;  Service: General;  Laterality: N/A;    SOCIAL HISTORY: Social History   Socioeconomic History   Marital status: Married    Spouse name: [name]   Number of children: 2   Years of education: Not on file   Highest education level: Not on file  Occupational History   Occupation: retired  Tobacco Use   Smoking status: Never   Smokeless tobacco: Never  Vaping Use   Vaping Use: Never used  Substance and Sexual Activity   Alcohol use: Yes    Comment: occassional   Drug use: No   Sexual activity: Yes    Birth control/protection: Post-menopausal  Other Topics Concern   Not on file  Social History Narrative   Not on file   Social Determinants of Health   Financial Resource Strain: Low Risk    Difficulty of Paying Living Expenses: Not hard at all  Food Insecurity: No Food Insecurity   Worried About Charity fundraiser in the Last Year: Never true   [place] in the Last Year: Never true  Transportation Needs: No Transportation Needs   Lack of Transportation (Medical): No   Lack of Transportation (Non-Medical): No  Physical Activity: Inactive   Days of Exercise per Week: 0 days   Minutes of Exercise per Session: 0 min  Stress: No Stress Concern Present   Feeling of Stress : Not at all  Social Connections: Not on file  Intimate Partner Violence: Not on file    FAMILY HISTORY: Family History  Problem Relation Age of Onset   Heart disease Mother    Kidney failure Mother    Hypertension Mother    Diabetes Mother    Heart attack Father    Hypertension Father    Thyroid disease Father    Hypertension Sister    Diabetes Sister    Thyroid disease  Daughter    Thyroid disease Daughter    Breast cancer Paternal Aunt     ALLERGIES:  is allergic to sulfa antibiotics and latex.  MEDICATIONS:  Current Outpatient Medications  Medication Sig Dispense Refill   amiodarone (PACERONE) 200 MG tablet Take 200 mg by mouth in the morning.     atorvastatin (LIPITOR) 80 MG tablet Take 80 mg by mouth daily after breakfast.   0   Cholecalciferol (VITAMIN D3) 50 MCG (2000 UT) TABS Take 2,000 Units by mouth in the morning.     Ferrous Sulfate (SLOW FE PO) Take 1 tablet by mouth in the morning.     furosemide (LASIX) 40 MG tablet Take 40 mg by mouth in the morning.     lansoprazole (PREVACID) 30 MG capsule TAKE ONE CAPSULE BY MOUTH DAILY AT 12:00 NOON 90 capsule 4   levothyroxine (SYNTHROID) 112 MCG tablet TAKE 1  TABLET(112 MCG) BY MOUTH DAILY 90 tablet 4   metoprolol succinate (TOPROL-XL) 100 MG 24 hr tablet Take 100 mg by mouth in the morning.     olmesartan (BENICAR) 40 MG tablet Take 40 mg by mouth daily.     Polyethyl Glycol-Propyl Glycol (SYSTANE) 0.4-0.3 % SOLN Place 1 drop into both eyes in the morning and at bedtime.     raloxifene (EVISTA) 60 MG tablet TAKE 1 TABLET(60 MG) BY MOUTH DAILY (Patient taking differently: Take 60 mg by mouth daily. TAKE 1 TABLET(60 MG) BY MOUTH DAILY) 90 tablet 4   sitaGLIPtin (JANUVIA) 25 MG tablet TAKE 1 TABLET(25 MG) BY MOUTH DAILY (Patient taking differently: Take 25 mg by mouth daily. TAKE 1 TABLET(25 MG) BY MOUTH DAILY) 90 tablet 4   vitamin B-12 (CYANOCOBALAMIN) 1000 MCG tablet Take 1,000 mcg by mouth in the morning.     XARELTO 20 MG TABS tablet Take 20 mg by mouth in the morning.     FARXIGA 10 MG TABS tablet Take 10 mg by mouth every morning.     HYDROcodone-acetaminophen (NORCO/VICODIN) 5-325 MG tablet Take 1 tablet by mouth every 4 (four) hours as needed for moderate pain. (Patient not taking: Reported on 02/23/2021) 12 tablet 0   No current facility-administered medications for this visit.    Facility-Administered Medications Ordered in Other Visits  Medication Dose Route Frequency Provider Last Rate Last Admin   ferumoxytol (FERAHEME) 510 mg in sodium chloride 0.9 % 100 mL IVPB  510 mg Intravenous Once Cammie Sickle, MD 468 mL/hr at 02/23/21 1410 510 mg at 02/23/21 1410      .  PHYSICAL EXAMINATION: ECOG PERFORMANCE STATUS: 1 - Symptomatic but completely ambulatory  Vitals:   02/23/21 1307  BP: (!) 148/67  Pulse: (!) 53  Resp: 16  Temp: (!) 96.8 F (36 C)   Filed Weights   02/23/21 1307  Weight: 202 lb 9.6 oz (91.9 kg)    Physical Exam HENT:     Head: Normocephalic and atraumatic.     Mouth/Throat:     Pharynx: No oropharyngeal exudate.  Eyes:     Pupils: Pupils are equal, round, and reactive to light.  Cardiovascular:     Rate and Rhythm: Normal rate and regular rhythm.  Pulmonary:     Effort: Pulmonary effort is normal. No respiratory distress.     Breath sounds: Normal breath sounds. No wheezing.  Abdominal:     General: Bowel sounds are normal. There is no distension.     Palpations: Abdomen is soft. There is no mass.     Tenderness: There is no abdominal tenderness. There is no guarding or rebound.  Musculoskeletal:        General: No tenderness. Normal range of motion.     Cervical back: Normal range of motion and neck supple.  Skin:    General: Skin is warm.  Neurological:     Mental Status: She is alert and oriented to person, place, and time.  Psychiatric:        Mood and Affect: Affect normal.     LABORATORY DATA:  I have reviewed the data as listed Lab Results  Component Value Date   WBC 7.5 02/21/2021   HGB 9.5 (L) 02/21/2021   HCT 31.1 (L) 02/21/2021   MCV 100.0 02/21/2021   PLT 150 02/21/2021   Recent Labs    06/29/20 1006 08/14/20 1354 09/05/20 1044 02/21/21 1124  NA 137 136 140 137  K 4.2 3.9 4.6 3.6  CL 100 100 99 105  CO2 26 28 26 25   GLUCOSE 152* 131* 138* 138*  BUN 23 21 18 14   CREATININE 1.60*  1.47* 1.59* 1.58*  CALCIUM 9.0 8.9 9.0 8.4*  GFRNONAA 34* 38*  --  35*  PROT 7.7   7.8 7.7  --   --   ALBUMIN 4.2   4.3 4.1  --   --   AST 40   42* 54*  --   --   ALT 37   38 36  --   --   ALKPHOS 73   78 80  --   --   BILITOT 1.2   1.3* 0.7  --   --   BILIDIR 0.3*  --   --   --   IBILI 1.0*  --   --   --      ASSESSMENT & PLAN:  Iron deficiency anemia due to chronic blood loss  Iron deficiency anemia- unclear etiology; question GI blood loss/CKD [hx large hiatal hernia status post fundoplication].  Hemoglobin today is 9.5 ; iron sat- 7%%; ferertin-57 Proceed with infusion today.   # B12 deficiency-low B12-June 2021; continue sublingual B12 for B12 injection  # CHF [Dr.Khan]/ CKD-IV sec to DM [Dr.Singh]  # ITP- Mild thrombocytopenia platelets 150 todayintermittent [2018]- STABLE  # DISPOSITION:   # IV Ferrahem today; NO B12 injection.  # Ferrahem monthly x 2- start next month  #  follow up in 4 months Nilda Riggs ferrahem-lasb- cbc/bmp-possible ferrahem Dr.B  Cc; Dr.Khan/Singh    Cammie Sickle, MD 02/23/2021 2:11 PM

## 2021-02-23 NOTE — Progress Notes (Signed)
Patient's energy level has not improved.

## 2021-03-02 ENCOUNTER — Encounter: Payer: Self-pay | Admitting: Internal Medicine

## 2021-03-02 NOTE — Telephone Encounter (Signed)
error 

## 2021-03-16 ENCOUNTER — Other Ambulatory Visit: Payer: Self-pay | Admitting: Internal Medicine

## 2021-03-23 ENCOUNTER — Inpatient Hospital Stay: Payer: Medicare Other | Attending: Internal Medicine

## 2021-04-20 ENCOUNTER — Other Ambulatory Visit: Payer: Self-pay

## 2021-04-20 ENCOUNTER — Inpatient Hospital Stay: Payer: Medicare Other | Attending: Internal Medicine

## 2021-04-20 VITALS — BP 121/55 | HR 54 | Temp 96.9°F | Resp 20

## 2021-04-20 DIAGNOSIS — Z79899 Other long term (current) drug therapy: Secondary | ICD-10-CM | POA: Diagnosis not present

## 2021-04-20 DIAGNOSIS — D500 Iron deficiency anemia secondary to blood loss (chronic): Secondary | ICD-10-CM | POA: Insufficient documentation

## 2021-04-20 MED ORDER — SODIUM CHLORIDE 0.9 % IV SOLN
Freq: Once | INTRAVENOUS | Status: AC
  Filled 2021-04-20: qty 250

## 2021-04-20 MED ORDER — SODIUM CHLORIDE 0.9 % IV SOLN: 200.0000 mg | Freq: Once | INTRAVENOUS | Status: DC

## 2021-04-20 MED ORDER — IRON SUCROSE 20 MG/ML IV SOLN
200.0000 mg | Freq: Once | INTRAVENOUS | Status: AC
  Administered 2021-04-20: 200 mg via INTRAVENOUS
  Filled 2021-04-20: qty 10

## 2021-04-20 NOTE — Patient Instructions (Signed)

## 2021-04-23 ENCOUNTER — Other Ambulatory Visit: Payer: Self-pay | Admitting: Nurse Practitioner

## 2021-04-23 DIAGNOSIS — E039 Hypothyroidism, unspecified: Secondary | ICD-10-CM

## 2021-04-24 NOTE — Telephone Encounter (Signed)
Lmom asking pt to call back to schedule an appointment with Jolene ?

## 2021-04-24 NOTE — Telephone Encounter (Signed)
Requested medication (s) are due for refill today: no ? ?Requested medication (s) are on the active medication list: yes ? ?Last refill:  01/22/21 #90/4 ? ?Future visit scheduled: yes ? ?Notes to clinic:  Unable to refill per protocol due to failed labs, no updated results. ? ? ? ?  ?Requested Prescriptions  ?Pending Prescriptions Disp Refills  ? levothyroxine (SYNTHROID) 112 MCG tablet [Pharmacy Med Name: LEVOTHYROXINE 0.'112MG'$  (112MCG) TABS] 90 tablet 4  ?  Sig: TAKE 1 TABLET(112 MCG) BY MOUTH DAILY(LABS ARE DUE)  ?  ? Endocrinology:  Hypothyroid Agents Failed - 04/23/2021 11:22 AM  ?  ?  Failed - TSH in normal range and within 360 days  ?  TSH  ?Date Value Ref Range Status  ?04/06/2020 2.000 0.450 - 4.500 uIU/mL Final  ?  ?  ?  ?  Passed - Valid encounter within last 12 months  ?  Recent Outpatient Visits   ? ?      ? 7 months ago Type 2 diabetes mellitus with stage 4 chronic kidney disease, without long-term current use of insulin ([place])  ? [place], Henrine Screws T, NP  ? 11 months ago Anal wart  ? [place], [place] T, NP  ? 1 year ago Type 2 diabetes mellitus with stage 4 chronic kidney disease, without long-term current use of insulin ([place])  ? [place], [place] T, NP  ? 1 year ago Type 2 diabetes mellitus with stage 4 chronic kidney disease, without long-term current use of insulin ([place])  ? [place], [place] T, NP  ? 1 year ago Type 2 diabetes mellitus with stage 4 chronic kidney disease, without long-term current use of insulin ([place])  ? [facility] [place], Henrine Screws T, NP  ? ?  ?  ?Future Appointments   ? ?        ? In 2 months [facility]   ? ?  ? ?  ?  ?  ? ?

## 2021-04-25 NOTE — Telephone Encounter (Signed)
Pt scheduled 3/24 @ 1 pm ?

## 2021-04-27 DIAGNOSIS — I340 Nonrheumatic mitral (valve) insufficiency: Secondary | ICD-10-CM | POA: Diagnosis not present

## 2021-04-27 DIAGNOSIS — E119 Type 2 diabetes mellitus without complications: Secondary | ICD-10-CM | POA: Diagnosis not present

## 2021-04-27 DIAGNOSIS — E782 Mixed hyperlipidemia: Secondary | ICD-10-CM | POA: Diagnosis not present

## 2021-04-27 DIAGNOSIS — Z9861 Coronary angioplasty status: Secondary | ICD-10-CM | POA: Diagnosis not present

## 2021-04-27 DIAGNOSIS — I2510 Atherosclerotic heart disease of native coronary artery without angina pectoris: Secondary | ICD-10-CM | POA: Diagnosis not present

## 2021-04-27 DIAGNOSIS — I10 Essential (primary) hypertension: Secondary | ICD-10-CM | POA: Diagnosis not present

## 2021-05-06 NOTE — Patient Instructions (Signed)

## 2021-05-11 ENCOUNTER — Ambulatory Visit
Admission: RE | Admit: 2021-05-11 | Discharge: 2021-05-11 | Disposition: A | Discharge status: Home / Self Care | Payer: Medicare Other | Source: Ambulatory Visit | Attending: Nurse Practitioner | Admitting: Nurse Practitioner

## 2021-05-11 ENCOUNTER — Encounter: Payer: Self-pay | Admitting: Nurse Practitioner

## 2021-05-11 ENCOUNTER — Ambulatory Visit
Admission: RE | Admit: 2021-05-11 | Discharge: 2021-05-11 | Disposition: A | Discharge status: Home / Self Care | Payer: Medicare Other | Attending: Nurse Practitioner | Admitting: Nurse Practitioner

## 2021-05-11 ENCOUNTER — Ambulatory Visit (INDEPENDENT_AMBULATORY_CARE_PROVIDER_SITE_OTHER): Payer: Medicare Other | Admitting: Nurse Practitioner

## 2021-05-11 ENCOUNTER — Other Ambulatory Visit: Payer: Self-pay

## 2021-05-11 VITALS — BP 124/65 | HR 55 | Temp 98.0°F | Ht 62.13 in | Wt 185.5 lb

## 2021-05-11 DIAGNOSIS — K219 Gastro-esophageal reflux disease without esophagitis: Secondary | ICD-10-CM

## 2021-05-11 DIAGNOSIS — I130 Hypertensive heart and chronic kidney disease with heart failure and stage 1 through stage 4 chronic kidney disease, or unspecified chronic kidney disease: Secondary | ICD-10-CM

## 2021-05-11 DIAGNOSIS — E538 Deficiency of other specified B group vitamins: Secondary | ICD-10-CM

## 2021-05-11 DIAGNOSIS — D6869 Other thrombophilia: Secondary | ICD-10-CM

## 2021-05-11 DIAGNOSIS — R197 Diarrhea, unspecified: Secondary | ICD-10-CM

## 2021-05-11 DIAGNOSIS — E1122 Type 2 diabetes mellitus with diabetic chronic kidney disease: Secondary | ICD-10-CM | POA: Diagnosis not present

## 2021-05-11 DIAGNOSIS — N184 Chronic kidney disease, stage 4 (severe): Secondary | ICD-10-CM | POA: Diagnosis not present

## 2021-05-11 DIAGNOSIS — K222 Esophageal obstruction: Secondary | ICD-10-CM

## 2021-05-11 DIAGNOSIS — M85851 Other specified disorders of bone density and structure, right thigh: Secondary | ICD-10-CM | POA: Diagnosis not present

## 2021-05-11 DIAGNOSIS — E1169 Type 2 diabetes mellitus with other specified complication: Secondary | ICD-10-CM | POA: Diagnosis not present

## 2021-05-11 DIAGNOSIS — I5032 Chronic diastolic (congestive) heart failure: Secondary | ICD-10-CM | POA: Diagnosis not present

## 2021-05-11 DIAGNOSIS — D500 Iron deficiency anemia secondary to blood loss (chronic): Secondary | ICD-10-CM

## 2021-05-11 DIAGNOSIS — E785 Hyperlipidemia, unspecified: Secondary | ICD-10-CM | POA: Diagnosis not present

## 2021-05-11 DIAGNOSIS — I2510 Atherosclerotic heart disease of native coronary artery without angina pectoris: Secondary | ICD-10-CM | POA: Diagnosis not present

## 2021-05-11 DIAGNOSIS — I480 Paroxysmal atrial fibrillation: Secondary | ICD-10-CM

## 2021-05-11 DIAGNOSIS — E039 Hypothyroidism, unspecified: Secondary | ICD-10-CM

## 2021-05-11 DIAGNOSIS — D696 Thrombocytopenia, unspecified: Secondary | ICD-10-CM | POA: Diagnosis not present

## 2021-05-11 DIAGNOSIS — D631 Anemia in chronic kidney disease: Secondary | ICD-10-CM

## 2021-05-11 DIAGNOSIS — R109 Unspecified abdominal pain: Secondary | ICD-10-CM | POA: Diagnosis not present

## 2021-05-11 LAB — MICROALBUMIN, URINE WAIVED
Creatinine, Urine Waived: 100 mg/dL (ref 10–300)
Microalb, Ur Waived: 10 mg/L (ref 0–19)
Microalb/Creat Ratio: <30 mg/g (ref <30)

## 2021-05-11 LAB — BAYER DCA HB A1C WAIVED: HB A1C (BAYER DCA - WAIVED): 5.8 % — ABNORMAL HIGH (ref 4.8–5.6)

## 2021-05-11 NOTE — Assessment & Plan Note (Signed)
Chronic, followed by nephrology at this time.  Urine micro alb 10 and A:C <17 May 2021.  Continue Olmesartan for kidney protection and monitor medications for renal dosing.  CMP today.  Avoid nephrotoxic medications. ?

## 2021-05-11 NOTE — Assessment & Plan Note (Signed)
Chronic, ongoing, continue supplement and recheck level today. ?

## 2021-05-11 NOTE — Assessment & Plan Note (Signed)
Chronic, ongoing, followed by hematology.  Continue this collaboration, recent notes reviewed. ?

## 2021-05-11 NOTE — Assessment & Plan Note (Signed)
Chronic, ongoing.  Continue current Levothyroxine dose and adjust as needed based on lab results.  Thyroid labs today. ?

## 2021-05-11 NOTE — Assessment & Plan Note (Signed)
Chronic, ongoing with rate controlled at this time.  Continue collaboration with cardiology and medication regimen as prescribed by Dr. Chancy Milroy.  Continue Xarelto, Amiodarone, and Metoprolol.   ?

## 2021-05-11 NOTE — Progress Notes (Addendum)
? ?BP 124/65   Pulse (!) 55   Temp 98 ?F (36.7 ?C) (Oral)   Ht 5' 2.13" (1.578 m)   Wt 185 lb 8 oz (84.1 kg)   SpO2 95%   BMI 33.79 kg/m?   ? ?Subjective:  ? ? Patient ID: Erica Larsen, female    DOB: 08/08/1950, 71 y.o.   MRN: [id number] ? ?HPI: ?Erica Larsen is a 71 y.o. female ? ?Chief Complaint  ?Patient presents with  ? Nausea  ?  For past month  ? Diabetes  ? Hypertension  ? Hyperlipidemia  ? Hypothyroidism  ? ?DIABETES ?Last A1c in July was 7%.  Continues on Januvia 25 MG (renal dosed) and Farxiga 10 MG (added in July 2022).  Lost to follow-up since then. ?Hypoglycemic episodes:no ?Polydipsia/polyuria: no ?Visual disturbance: no ?Chest pain: no ?Paresthesias: no ?Glucose Monitoring: rarely ?            Accucheck frequency: rarely ?            Fasting glucose: this morning 96 ?            Post prandial: ?            Evening: ?            Before meals: ?Taking Insulin?: no ?            Long acting insulin: ?            Short acting insulin: ?Blood Pressure Monitoring: daily ?Retinal Examination: Not up to Date ?Foot Exam: Up to Date ?Pneumovax: Up to Date ?Influenza: Up to Date ?Aspirin: no  ?  ?ATRIAL FIBRILLATION ?Followed by cardiology, saw last one month ago.  No changes made. Continues on Amiodarone. ?Atrial fibrillation status: stable ?Satisfied with current treatment: yes  ?Medication side effects:  no ?Medication compliance: good compliance ?Etiology of atrial fibrillation: unknown ?Palpitations:  no ?Chest pain:  no ?Dyspnea on exertion: none ?Orthopnea:  no ?Syncope:  no ?Edema: a little bit at baseline ?Ventricular rate control: B-blocker ?Anti-coagulation: long acting ?  ?HYPERTENSION / HYPERLIPIDEMIA/CHF ?Continues on Atorvastatin for HLD.   Followed by Dr. Humphrey Rolls, cardiology.  Continues on Olmesartan, Amlodipine, Amiodarone, Metoprolol, Lasix.  Last Echo with EF 49% in May 2020. ?Satisfied with current treatment? yes ?Duration of hypertension: chronic ?BP monitoring frequency: rarely ?BP  range: 120/70-80 ?BP medication side effects: no ?Duration of hyperlipidemia: chronic ?Cholesterol medication side effects: no ?Cholesterol supplements: none ?Medication compliance: good compliance ?Aspirin: no ?Recent stressors: no ?Recurrent headaches: no ?Visual changes: no ?Palpitations: no ?Dyspnea: no ?Chest pain: no ?Lower extremity edema: a little bit at baseline ?Dizzy/lightheaded: no  ?  ?CHRONIC KIDNEY DISEASE ?Saw [facility] saw on 12/04/20 -- GFR 35, CRT 1.58, BUN 14, PTH 88, PLT 150.  Has history of IV contract exposure in June 2018.  ? ?Is currently followed by hematology for chronic iron deficiency anemia with last visit on 02/23/21 - with infusion on 04/20/21. ?CKD status: stable ?Medications renally dose: yes ?Previous renal evaluation: yes ?Pneumovax:  Up to Date ?Influenza Vaccine:  Up to Date ? ?HYPOTHYROIDISM ?Continues on Levothyroxine 112 MCG daily. ?Thyroid control status:stable ?Satisfied with current treatment? yes ?Medication side effects: no ?Medication compliance: good compliance ?Etiology of hypothyroidism:  ?Recent dose adjustment:no ?Fatigue: no ?Cold intolerance: no ?Heat intolerance: no ?Weight gain: no ?Weight loss: no ?Constipation: no ?Diarrhea/loose stools: for one month ?Palpitations: no ?Lower extremity edema: no ?Anxiety/depressed mood: no  ? ?DIARRHEA ?Continues on Prevacid daily  for GERD.  Has noticed over past month that after she eats she gets pain in epigastric area, then has to go to bathroom -- has diarrhea.  This happens almost every time she eats.  One day she felt like she had something stuck in gastric area and felt nauseous.  Has lost 17 pounds since January unintentional. ?Duration: for one month ?Onset: sudden ?Severity: 9/10 ?Quality: dull and aching -- after uses bathroom pain goes away ?Location:  epigastric  ?Episode duration: until after she uses bathroom ?Radiation: no ?Frequency: intermittent ?Alleviating factors: using bathroom ?Aggravating  factors: eating and drinking ?Status: worse ?Treatments attempted: none ?Fever: no ?Nausea: yes ?Vomiting: no ?Weight loss: yes ?Decreased appetite: yes ?Diarrhea: yes ?Constipation: no ?Blood in stool: no ?Heartburn: no ?Jaundice: no ?Rash: no ?Dysuria/urinary frequency: no ?Hematuria: no ?History of sexually transmitted disease: no ?Recurrent NSAID use: no  ? ?OSTEOPENIA ?DEXA 10/01/2012 noted T-score of  -1.3 and then repeat on 05/15/20 noted T-score of -1.7. Continues on Raloxifene. ?Satisfied with current treatment?: yes ?Medication side effects: no ?Medication compliance: good compliance ?Past osteoporosis medications/treatments:  ?Adequate calcium & vitamin D: yes ?Weight bearing exercises: yes  ? ?Relevant past medical, surgical, family and social history reviewed and updated as indicated. Interim medical history since our last visit reviewed. ?Allergies and medications reviewed and updated. ? ?Review of Systems  ?Constitutional:  Negative for activity change, appetite change, diaphoresis, fatigue and fever.  ?Respiratory:  Negative for cough, chest tightness and shortness of breath.   ?Cardiovascular:  Negative for chest pain, palpitations and leg swelling.  ?Gastrointestinal: Negative.   ?Endocrine: Negative for cold intolerance, heat intolerance, polydipsia, polyphagia and polyuria.  ?Neurological: Negative.   ?Psychiatric/Behavioral: Negative.    ? ?Per HPI unless specifically indicated above ? ?   ?Objective:  ?  ?BP 124/65   Pulse (!) 55   Temp 98 ?F (36.7 ?C) (Oral)   Ht 5' 2.13" (1.578 m)   Wt 185 lb 8 oz (84.1 kg)   SpO2 95%   BMI 33.79 kg/m?   ?Wt Readings from Last 3 Encounters:  ?05/11/21 185 lb 8 oz (84.1 kg)  ?02/23/21 202 lb 9.6 oz (91.9 kg)  ?09/22/20 201 lb 15.1 oz (91.6 kg)  ?  ?Physical Exam ?Vitals and nursing note reviewed.  ?Constitutional:   ?   General: She is awake. She is not in acute distress. ?   Appearance: She is well-developed and well-groomed. She is morbidly obese. She is  not ill-appearing.  ?HENT:  ?   Head: Normocephalic.  ?   Right Ear: Hearing normal.  ?   Left Ear: Hearing normal.  ?Eyes:  ?   General: Lids are normal.     ?   Right eye: No discharge.     ?   Left eye: No discharge.  ?   Conjunctiva/sclera: Conjunctivae normal.  ?   Pupils: Pupils are equal, round, and reactive to light.  ?Neck:  ?   Thyroid: No thyromegaly.  ?   Vascular: No carotid bruit.  ?Cardiovascular:  ?   Rate and Rhythm: Normal rate and regular rhythm.  ?   Heart sounds: Murmur heard.  ?Systolic murmur is present with a grade of 2/6.  ?  No gallop.  ?Pulmonary:  ?   Effort: Pulmonary effort is normal. No accessory muscle usage or respiratory distress.  ?   Breath sounds: Normal breath sounds.  ?Abdominal:  ?   General: Bowel sounds are normal. There is no distension.  ?  Palpations: Abdomen is soft.  ?   Tenderness: There is generalized abdominal tenderness. There is no right CVA tenderness, left CVA tenderness or guarding. Negative signs include Murphy's sign and McBurney's sign.  ?Musculoskeletal:  ?   Cervical back: Normal range of motion and neck supple.  ?   Right hip: Normal.  ?   Left hip: No tenderness, bony tenderness or crepitus. Normal range of motion. Normal strength.  ?   Right lower leg: No edema.  ?   Left lower leg: No edema.  ?Skin: ?   General: Skin is warm and dry.  ?Neurological:  ?   Mental Status: She is alert and oriented to person, place, and time.  ?Psychiatric:     ?   Attention and Perception: Attention normal.     ?   Mood and Affect: Mood normal.     ?   Behavior: Behavior normal. Behavior is cooperative.     ?   Thought Content: Thought content normal.     ?   Judgment: Judgment normal.  ? ?Diabetic Foot Exam - Simple   ?Simple Foot Form ?Visual Inspection ?No deformities, no ulcerations, no other skin breakdown bilaterally: Yes ?Sensation Testing ?Intact to touch and monofilament testing bilaterally: Yes ?Pulse Check ?Posterior Tibialis and Dorsalis pulse intact  bilaterally: Yes ?Comments ?  ? ? ?Results for orders placed or performed in visit on 02/21/21  ?Vitamin B12  ?Result Value Ref Range  ? Vitamin B-12 503 180 - 914 pg/mL  ?Iron and TIBC([facility])  ?Result Value Ref

## 2021-05-11 NOTE — Assessment & Plan Note (Signed)
BMI 33.79. Recommended eating smaller high protein, low fat meals more frequently and exercising 30 mins a day 5 times a week with a goal of 10-15lb weight loss in the next 3 months. Patient voiced their understanding and motivation to adhere to these recommendations. ? ?

## 2021-05-11 NOTE — Assessment & Plan Note (Signed)
Secondary to Xarelto use.  Continue to monitor for bleeding and wounds.  Gentle skin care at home.  Followed by hematology for anemia. ?

## 2021-05-11 NOTE — Assessment & Plan Note (Signed)
Chronic, ongoing.  Continue current medication regimen and adjust as needed. Lipid panel today. 

## 2021-05-11 NOTE — Assessment & Plan Note (Signed)
Chronic, ongoing.  Continue Prevacid at this time and adjust as needed. Mag level annually. ?

## 2021-05-11 NOTE — Assessment & Plan Note (Signed)
Noted on DEXA 10/01/2012.  Plan to check Vitamin D level today.  Continue Raloxifene and monitor kidney function closely with use. ?

## 2021-05-11 NOTE — Assessment & Plan Note (Signed)
Chronic, ongoing, followed by cardiology, continue current medication regimen and collaboration. ?

## 2021-05-11 NOTE — Assessment & Plan Note (Signed)
Chronic, with A1C at 5.8% and urine ALB 10 in March 2023.  Continue current medication regimen, Januvia & Farxiga and monitoring BS at home regularly. May benefit from discontinuation of Januvia in future.  Recommend focus on diabetic diet at home.  Return in 3 months.   ?

## 2021-05-11 NOTE — Assessment & Plan Note (Signed)
Noted on labs, followed by hematology, continue this collaboration. ?

## 2021-05-11 NOTE — Assessment & Plan Note (Signed)
New onset over past month with weight loss of 17 pounds since January.  Check thyroid labs today and adjust her medication as needed, she is on Amiodarone.  Check CMP and obtain abdominal imaging.  Will send home with stool sample to collect.  Recommend increased hydration at home.  Will further plan upon return of labs.  May need GI referral. ?

## 2021-05-11 NOTE — Assessment & Plan Note (Addendum)
Chronic, ongoing.  Euvolemic today.  Continue collaboration with cardiology and current medication regimen.  Recommend: ?- Reminded to call for an overnight weight gain of >2 pounds or a weekly weight weight of >5 pounds ?- not adding salt to his food and has been reading food labels. Reviewed the importance of keeping daily sodium intake to '2000mg'$  daily  ?- Avoid Ibuprofen products ?

## 2021-05-11 NOTE — Assessment & Plan Note (Addendum)
Chronic, with BP at goal today in office and at home.  Continue current medication regimen as prescribed by cardiology and collaboration with cardiology and nephrology.  CMP today and urine ALB today.  Recommend she continue to monitor BP regularly at home and document + bring to visits.  DASH diet focus.  Return in 3 months. ?

## 2021-05-12 LAB — LIPID PANEL W/O CHOL/HDL RATIO
Cholesterol, Total: 116 mg/dL (ref 100–199)
HDL: 38 mg/dL — ABNORMAL LOW (ref >39)
LDL Chol Calc (NIH): 59 mg/dL (ref 0–99)
Triglycerides: 104 mg/dL (ref 0–149)
VLDL Cholesterol Cal: 19 mg/dL (ref 5–40)

## 2021-05-12 LAB — COMPREHENSIVE METABOLIC PANEL
ALT: 32 IU/L (ref 0–32)
AST: 55 IU/L — ABNORMAL HIGH (ref 0–40)
Albumin/Globulin Ratio: 1 — ABNORMAL LOW (ref 1.2–2.2)
Albumin: 3.5 g/dL — ABNORMAL LOW (ref 3.7–4.7)
Alkaline Phosphatase: 93 IU/L (ref 44–121)
BUN/Creatinine Ratio: 7 — ABNORMAL LOW (ref 12–28)
BUN: 10 mg/dL (ref 8–27)
Bilirubin Total: 1 mg/dL (ref 0.0–1.2)
CO2: 22 mmol/L (ref 20–29)
Calcium: 8.9 mg/dL (ref 8.7–10.3)
Chloride: 105 mmol/L (ref 96–106)
Creatinine, Ser: 1.47 mg/dL — ABNORMAL HIGH (ref 0.57–1.00)
Globulin, Total: 3.4 g/dL (ref 1.5–4.5)
Glucose: 99 mg/dL (ref 70–99)
Potassium: 4 mmol/L (ref 3.5–5.2)
Sodium: 139 mmol/L (ref 134–144)
Total Protein: 6.9 g/dL (ref 6.0–8.5)
eGFR: 38 mL/min/{1.73_m2} — ABNORMAL LOW (ref >59)

## 2021-05-12 LAB — TSH: TSH: 1.46 u[IU]/mL (ref 0.450–4.500)

## 2021-05-12 LAB — VITAMIN D 25 HYDROXY (VIT D DEFICIENCY, FRACTURES): Vit D, 25-Hydroxy: 74.7 ng/mL (ref 30.0–100.0)

## 2021-05-12 LAB — VITAMIN B12: Vitamin B-12: 1229 pg/mL (ref 232–1245)

## 2021-05-12 LAB — THYROID PEROXIDASE ANTIBODY: Thyroperoxidase Ab SerPl-aCnc: 17 IU/mL (ref 0–34)

## 2021-05-12 LAB — T4, FREE: Free T4: 2.84 ng/dL — ABNORMAL HIGH (ref 0.82–1.77)

## 2021-05-12 NOTE — Progress Notes (Signed)
Good morning crew, please let Erica Larsen know her labs have returned: ?- Cholesterol labs are stable, continue your Atorvastatin. ?- Thyroid labs show a normal TSH, but Free T4 a little elevated which is common in patients on Amiodarone.  Please continue your current Levothyroxine dosing. ?- Kidney function continues to show ongoing kidney disease with no decline, continue visits with kidney doctor. ?- Remainder of labs are stable.  Any questions? ?Keep being stellar!!  Thank you for allowing me to participate in your care.  I appreciate you. ?Kindest regards, ?Erica Larsen ?

## 2021-05-14 NOTE — Progress Notes (Signed)
Please let Erica Larsen know her abdominal imaging returned normal, no signs of blockage.  No reason for her discomfort and diarrhea.  Please ensure to bring back in stool sample so we can further assess.  Any questions?

## 2021-05-15 DIAGNOSIS — R197 Diarrhea, unspecified: Secondary | ICD-10-CM | POA: Diagnosis not present

## 2021-05-18 NOTE — Progress Notes (Signed)
Please let patient know at this time stool sample is showing no infection present, there is one more area still in progress, if this returns positive I will let you know.  Any questions? ?

## 2021-05-19 NOTE — Patient Instructions (Signed)

## 2021-05-20 LAB — CDIFF NAA+O+P+STOOL CULTURE
E coli, Shiga toxin Assay: NEGATIVE
Toxigenic C. Difficile by PCR: NEGATIVE

## 2021-05-21 ENCOUNTER — Inpatient Hospital Stay: Payer: Medicare Other | Attending: Internal Medicine

## 2021-05-21 VITALS — BP 130/57 | HR 58 | Temp 97.6°F | Resp 16

## 2021-05-21 DIAGNOSIS — D500 Iron deficiency anemia secondary to blood loss (chronic): Secondary | ICD-10-CM

## 2021-05-21 DIAGNOSIS — Z79899 Other long term (current) drug therapy: Secondary | ICD-10-CM | POA: Insufficient documentation

## 2021-05-21 DIAGNOSIS — D509 Iron deficiency anemia, unspecified: Secondary | ICD-10-CM | POA: Diagnosis not present

## 2021-05-21 MED ORDER — SODIUM CHLORIDE 0.9 % IV SOLN: 200.0000 mg | Freq: Once | INTRAVENOUS | Status: DC

## 2021-05-21 MED ORDER — SODIUM CHLORIDE 0.9 % IV SOLN
Freq: Once | INTRAVENOUS | Status: AC
  Filled 2021-05-21: qty 250

## 2021-05-21 MED ORDER — IRON SUCROSE 20 MG/ML IV SOLN
200.0000 mg | Freq: Once | INTRAVENOUS | Status: AC
  Administered 2021-05-21: 200 mg via INTRAVENOUS

## 2021-05-25 ENCOUNTER — Encounter: Payer: Self-pay | Admitting: Nurse Practitioner

## 2021-05-25 ENCOUNTER — Ambulatory Visit (INDEPENDENT_AMBULATORY_CARE_PROVIDER_SITE_OTHER): Payer: Medicare Other | Admitting: Nurse Practitioner

## 2021-05-25 VITALS — BP 136/73 | HR 67 | Temp 97.6°F | Ht 62.13 in | Wt 191.0 lb

## 2021-05-25 DIAGNOSIS — R197 Diarrhea, unspecified: Secondary | ICD-10-CM | POA: Diagnosis not present

## 2021-05-25 DIAGNOSIS — E039 Hypothyroidism, unspecified: Secondary | ICD-10-CM

## 2021-05-25 DIAGNOSIS — Z8719 Personal history of other diseases of the digestive system: Secondary | ICD-10-CM

## 2021-05-25 DIAGNOSIS — N184 Chronic kidney disease, stage 4 (severe): Secondary | ICD-10-CM | POA: Diagnosis not present

## 2021-05-25 DIAGNOSIS — E1122 Type 2 diabetes mellitus with diabetic chronic kidney disease: Secondary | ICD-10-CM | POA: Diagnosis not present

## 2021-05-25 NOTE — Assessment & Plan Note (Signed)
BMI 34.79, has gained 6 pounds since recent visit, no further loss. Recommended eating smaller high protein, low fat meals more frequently and exercising 30 mins a day 5 times a week with a goal of 10-15lb weight loss in the next 3 months. Patient voiced their understanding and motivation to adhere to these recommendations. ? ?

## 2021-05-25 NOTE — Assessment & Plan Note (Signed)
Repair in 2018 -- will get back into GI due to current symptoms. ?

## 2021-05-25 NOTE — Progress Notes (Signed)
? ?BP 136/73   Pulse 67   Temp 97.6 ?F (36.4 ?C) (Oral)   Ht 5' 2.13" (1.578 m)   Wt 191 lb (86.6 kg)   SpO2 97%   BMI 34.79 kg/m?   ? ?Subjective:  ? ? Patient ID: Erica Larsen, female    DOB: 1950/07/30, 71 y.o.   MRN: [id number] ? ?HPI: ?Erica Larsen is a 71 y.o. female ? ?Chief Complaint  ?Patient presents with  ? Diarrhea  ?  Patient states she has been better for a couple of days, but not a lot.   ? Weight Loss  ? Diabetes  ?  Patient declines having a recent diabetic eye exam.   ? ?DIABETES ?Last A1c recent visit was 5.8%.  Continues on Januvia 25 MG (renal dosed) and Farxiga 10 MG (added in July 2022).  ?Hypoglycemic episodes:no ?Polydipsia/polyuria: no ?Visual disturbance: no ?Chest pain: no ?Paresthesias: no ?Glucose Monitoring: rarely ?            Accucheck frequency: rarely ?            Fasting glucose: this morning 96 ?            Post prandial: ?            Evening: ?            Before meals: ?Taking Insulin?: no ?            Long acting insulin: ?            Short acting insulin: ?Blood Pressure Monitoring: daily ?Retinal Examination: Not up to Date ?Foot Exam: Up to Date ?Pneumovax: Up to Date ?Influenza: Up to Date ?Aspirin: no  ?  ?HYPOTHYROIDISM ?Continues on Levothyroxine 112 MCG daily. ?Thyroid control status:stable ?Satisfied with current treatment? yes ?Medication side effects: no ?Medication compliance: good compliance ?Etiology of hypothyroidism:  ?Recent dose adjustment:no ?Fatigue: no ?Cold intolerance: no ?Heat intolerance: no ?Weight gain: no ?Weight loss: no ?Constipation: no ?Diarrhea/loose stools: for one month ?Palpitations: no ?Lower extremity edema: no ?Anxiety/depressed mood: no  ? ?DIARRHEA ?Follow-up today for diarrhea.  Had noticed over past month that after she eats she gets pain in epigastric area, then has to go to bathroom -- has diarrhea.  This happens almost every time she eats.  Continue on Prevacid. Follows with hematology for anemia -- last saw 02/23/21 and  received infusion. ? ?Has lost 17 pounds since January unintentional, but on review today has gained back 6 pounds.  Over last couple days it has improved.  No episodes of diarrhea over past two days.  Still has gall bladder in place.  Last colonoscopy in 2017 was to repeat in 5 years.  History of hiatal hernia repair in 2018. ?Duration: for one month ?Onset: sudden ?Severity: 9/10 ?Quality: dull and aching -- after uses bathroom pain goes away ?Location:  epigastric  ?Episode duration: until after she uses bathroom ?Radiation: no ?Frequency: intermittent ?Alleviating factors: using bathroom ?Aggravating factors: eating and drinking ?Status: worse ?Treatments attempted: none ?Fever: no ?Nausea: yes ?Vomiting: no ?Weight loss: yes ?Decreased appetite: yes ?Diarrhea: yes ?Constipation: no ?Blood in stool: no ?Heartburn: no ?Jaundice: no ?Rash: no ?Dysuria/urinary frequency: no ?Hematuria: no ?History of sexually transmitted disease: no ?Recurrent NSAID use: no  ? ?Relevant past medical, surgical, family and social history reviewed and updated as indicated. Interim medical history since our last visit reviewed. ?Allergies and medications reviewed and updated. ? ?Review of Systems  ?Constitutional:  Negative for activity  change, appetite change, diaphoresis, fatigue and fever.  ?Respiratory:  Negative for cough, chest tightness and shortness of breath.   ?Cardiovascular:  Negative for chest pain, palpitations and leg swelling.  ?Gastrointestinal: Negative.   ?Endocrine: Negative for cold intolerance, heat intolerance, polydipsia, polyphagia and polyuria.  ?Neurological: Negative.   ?Psychiatric/Behavioral: Negative.    ? ?Per HPI unless specifically indicated above ? ?   ?Objective:  ?  ?BP 136/73   Pulse 67   Temp 97.6 ?F (36.4 ?C) (Oral)   Ht 5' 2.13" (1.578 m)   Wt 191 lb (86.6 kg)   SpO2 97%   BMI 34.79 kg/m?   ?Wt Readings from Last 3 Encounters:  ?05/25/21 191 lb (86.6 kg)  ?05/11/21 185 lb 8 oz (84.1 kg)   ?02/23/21 202 lb 9.6 oz (91.9 kg)  ?  ?Physical Exam ?Vitals and nursing note reviewed.  ?Constitutional:   ?   General: She is awake. She is not in acute distress. ?   Appearance: She is well-developed and well-groomed. She is morbidly obese. She is not ill-appearing.  ?HENT:  ?   Head: Normocephalic.  ?   Right Ear: Hearing normal.  ?   Left Ear: Hearing normal.  ?Eyes:  ?   General: Lids are normal.     ?   Right eye: No discharge.     ?   Left eye: No discharge.  ?   Conjunctiva/sclera: Conjunctivae normal.  ?   Pupils: Pupils are equal, round, and reactive to light.  ?Neck:  ?   Thyroid: No thyromegaly.  ?   Vascular: No carotid bruit.  ?Cardiovascular:  ?   Rate and Rhythm: Normal rate and regular rhythm.  ?   Heart sounds: Murmur heard.  ?Systolic murmur is present with a grade of 2/6.  ?  No gallop.  ?Pulmonary:  ?   Effort: Pulmonary effort is normal. No accessory muscle usage or respiratory distress.  ?   Breath sounds: Normal breath sounds.  ?Abdominal:  ?   General: Bowel sounds are normal. There is no distension.  ?   Palpations: Abdomen is soft.  ?   Tenderness: There is abdominal tenderness in the epigastric area. There is no right CVA tenderness, left CVA tenderness or guarding. Negative signs include Murphy's sign and McBurney's sign.  ?   Comments: Multiple bruises to abdomen, which she reports is normal and baseline for her with her blood thinner.  ?Musculoskeletal:  ?   Cervical back: Normal range of motion and neck supple.  ?   Right hip: Normal.  ?   Left hip: No tenderness, bony tenderness or crepitus. Normal range of motion. Normal strength.  ?   Right lower leg: No edema.  ?   Left lower leg: No edema.  ?Skin: ?   General: Skin is warm and dry.  ?Neurological:  ?   Mental Status: She is alert and oriented to person, place, and time.  ?Psychiatric:     ?   Attention and Perception: Attention normal.     ?   Mood and Affect: Mood normal.     ?   Behavior: Behavior normal. Behavior is  cooperative.     ?   Thought Content: Thought content normal.     ?   Judgment: Judgment normal.  ? ?Results for orders placed or performed in visit on 05/11/21  ?Cdiff NAA+O+P+Stool Culture  ? Specimen: Stool  ? ST  ?Result Value Ref Range  ? Salmonella/Shigella Screen Final  report   ? Stool Culture result 1 ([facility]) Comment   ? Campylobacter Culture Final report   ? Stool Culture result 1 ([facility]) Comment   ? E coli, Shiga toxin Assay Negative Negative  ? OVA + PARASITE EXAM Final report   ? O&P result 1 Comment   ? Toxigenic C. Difficile by PCR Negative Negative  ?Bayer DCA Hb A1c Waived  ?Result Value Ref Range  ? HB A1C (BAYER DCA - WAIVED) 5.8 (H) 4.8 - 5.6 %  ?Microalbumin, Urine Waived  ?Result Value Ref Range  ? Microalb, Ur Waived 10 0 - 19 mg/L  ? Creatinine, Urine Waived 100 10 - 300 mg/dL  ? Microalb/Creat Ratio <30 <30 mg/g  ?Lipid Panel w/o Chol/HDL Ratio  ?Result Value Ref Range  ? Cholesterol, Total 116 100 - 199 mg/dL  ? Triglycerides 104 0 - 149 mg/dL  ? HDL 38 (L) >39 mg/dL  ? VLDL Cholesterol Cal 19 5 - 40 mg/dL  ? LDL Chol Calc (NIH) 59 0 - 99 mg/dL  ?TSH  ?Result Value Ref Range  ? TSH 1.460 0.450 - 4.500 uIU/mL  ?Thyroid peroxidase antibody  ?Result Value Ref Range  ? Thyroperoxidase Ab SerPl-aCnc 17 0 - 34 IU/mL  ?T4, free  ?Result Value Ref Range  ? Free T4 2.84 (H) 0.82 - 1.77 ng/dL  ?VITAMIN D 25 Hydroxy (Vit-D Deficiency, Fractures)  ?Result Value Ref Range  ? Vit D, 25-Hydroxy 74.7 30.0 - 100.0 ng/mL  ?Vitamin B12  ?Result Value Ref Range  ? Vitamin B-12 1,229 232 - 1,245 pg/mL  ?Comprehensive metabolic panel  ?Result Value Ref Range  ? Glucose 99 70 - 99 mg/dL  ? BUN 10 8 - 27 mg/dL  ? Creatinine, Ser 1.47 (H) 0.57 - 1.00 mg/dL  ? eGFR 38 (L) >59 mL/min/1.73  ? BUN/Creatinine Ratio 7 (L) 12 - 28  ? Sodium 139 134 - 144 mmol/L  ? Potassium 4.0 3.5 - 5.2 mmol/L  ? Chloride 105 96 - 106 mmol/L  ? CO2 22 20 - 29 mmol/L  ? Calcium 8.9 8.7 - 10.3 mg/dL  ? Total Protein 6.9 6.0 - 8.5 g/dL  ?  Albumin 3.5 (L) 3.7 - 4.7 g/dL  ? Globulin, Total 3.4 1.5 - 4.5 g/dL  ? Albumin/Globulin Ratio 1.0 (L) 1.2 - 2.2  ? Bilirubin Total 1.0 0.0 - 1.2 mg/dL  ? Alkaline Phosphatase 93 44 - 121 IU/L  ? AST 55 (H) 0 - 40 IU/L  ? ALT

## 2021-05-25 NOTE — Assessment & Plan Note (Signed)
Chronic, ongoing.  Continue current Levothyroxine dose and adjust as needed based on lab results.  Thyroid labs stable. ?

## 2021-05-25 NOTE — Assessment & Plan Note (Signed)
Chronic, with A1C at 5.8% and urine ALB 10 in March 2023.  Continue current medication regimen, Januvia & Farxiga and monitoring BS at home regularly. May benefit from discontinuation of Januvia in future.  Recommend focus on diabetic diet at home.  Return in 3 months.   ?

## 2021-05-25 NOTE — Assessment & Plan Note (Signed)
New onset over past months with some improvement over past two days, but ongoing tenderness to epigastric area.  Recent labs and imaging reassuring.  Recommend increased hydration at home.  Bruises to abdomen which she reports as baseline for her.  Referral to GI placed, is due to colonoscopy too. ?

## 2021-06-04 ENCOUNTER — Ambulatory Visit: Payer: Medicare Other | Admitting: Pharmacy Technician

## 2021-06-04 DIAGNOSIS — N1832 Chronic kidney disease, stage 3b: Secondary | ICD-10-CM | POA: Diagnosis not present

## 2021-06-04 DIAGNOSIS — R8290 Unspecified abnormal findings in urine: Secondary | ICD-10-CM | POA: Diagnosis not present

## 2021-06-04 DIAGNOSIS — E1122 Type 2 diabetes mellitus with diabetic chronic kidney disease: Secondary | ICD-10-CM | POA: Diagnosis not present

## 2021-06-04 DIAGNOSIS — Z79899 Other long term (current) drug therapy: Secondary | ICD-10-CM

## 2021-06-05 NOTE — Progress Notes (Signed)
Patient in Medicare Part D Coverage Gap.   ? ?[facility] application and contract.  Patient agreed to all terms of the [facility] contract.   ? ?[facility] will only provide medication assistance for PAP meds while patient is in the coverage gap.  Patient indicated that generic medications are still being covered by Part D plan.  ? ?Erica Larsen ?Care Manager ?[facility] ?

## 2021-06-07 ENCOUNTER — Ambulatory Visit: Payer: Medicare Other | Admitting: Gastroenterology

## 2021-06-07 ENCOUNTER — Encounter: Payer: Self-pay | Admitting: Gastroenterology

## 2021-06-07 ENCOUNTER — Other Ambulatory Visit: Payer: Self-pay

## 2021-06-07 VITALS — BP 147/79 | HR 54 | Temp 97.6°F | Ht 62.13 in | Wt 192.2 lb

## 2021-06-07 DIAGNOSIS — R1013 Epigastric pain: Secondary | ICD-10-CM

## 2021-06-07 DIAGNOSIS — K529 Noninfective gastroenteritis and colitis, unspecified: Secondary | ICD-10-CM

## 2021-06-07 DIAGNOSIS — R140 Abdominal distension (gaseous): Secondary | ICD-10-CM | POA: Diagnosis not present

## 2021-06-07 DIAGNOSIS — D509 Iron deficiency anemia, unspecified: Secondary | ICD-10-CM

## 2021-06-07 MED ORDER — NA SULFATE-K SULFATE-MG SULF 17.5-3.13-1.6 GM/177ML PO SOLN: 354.0000 mL | Freq: Once | ORAL | 0 refills | Status: AC

## 2021-06-07 NOTE — Progress Notes (Signed)
?  ?[name], MD ?[address]  ?[address]  ?[place], [place] [address]  ?Main: [phone]  ?Fax: [phone] ? ? ? ?Gastroenterology Consultation ? ?Referring Provider:     Venita Lick, NP ?Primary Care Physician:  Venita Lick, NP ?Primary Gastroenterologist:  Dr. [name] ?Reason for Consultation:     Chronic diarrhea, epigastric pain and bloating ?      ? HPI:   ?Erica Larsen is a 71 y.o. female referred by Dr. Venita Lick, NP  for consultation & management of chronic diarrhea.  Patient reports that for more than 2 months, she has been experiencing nonbloody diarrheal episodes associated with upper abdominal pain that radiates to the lower abdomen which is every time after she eats.  She also complains of abdominal bloating.  She denies any rectal bleeding.  She denies any weight loss.  She has history of metabolic syndrome.  Her diabetes is under control.  Patient underwent stool studies, negative for infection last month.  TSH normal, has normocytic anemia, along with iron deficiency has CKD, AST has been elevated, has history of fatty liver ? ?Patient has history of large hiatal hernia, underwent hernia repair.  Patient also reports that she had colonoscopy in 2017, found to have tubular adenomas of the colon and she is due for surveillance colonoscopy ? ?NSAIDs: None ? ?Antiplts/Anticoagulants/Anti thrombotics: Xarelto for history of A-fib ? ?GI Procedures:  ?Upper endoscopy 05/14/2016, large hiatal hernia ?Colonoscopy 08/01/2015, found to have tubular adenomas of the colon ? ?Past Medical History:  ?Diagnosis Date  ? A-fib ([place])   ? Anemia of chronic disease 2017  ? Chronic diastolic HF (heart failure) ([place])   ? CKD (chronic kidney disease), stage IV ([place])   ? Coronary artery disease   ? a.) PCI on 12/05/2006 - 80% stenosis mLAD and 70% stenosis RCA; Xience V 2.75 x 23 mm DES to mLAD   ? COVID-19 virus detected 08/14/2018  ? Diverticulosis   ? Esophagitis   ? GERD  (gastroesophageal reflux disease)   ? Grade III diastolic dysfunction   ? Hiatal hernia   ? High cholesterol   ? Hypercalcemia   ? Hypertension   ? Hypothyroidism   ? LBBB (left bundle branch block)   ? Lumbar radiculopathy   ? Obesity   ? Osteoarthritis   ? Osteopenia   ? Hips  ? PAH (pulmonary artery hypertension) ([place])   ? mild per 07/14/2018 TTE  ? Pneumonia   ? T2DM (type 2 diabetes mellitus) ([place])   ? Valvular insufficiency   ? a. MODERATE PV, TV, MV regurgitation on 07/14/2018 TTE  ? ? ?Past Surgical History:  ?Procedure Laterality Date  ? CATARACT EXTRACTION Bilateral   ? May - June 2018  ? COLONOSCOPY WITH PROPOFOL N/A 08/01/2015  ? Procedure: COLONOSCOPY WITH PROPOFOL;  Surgeon: Lucilla Lame, MD;  Location: [facility];  Service: Endoscopy;  Laterality: N/A;  ? CORONARY ANGIOPLASTY WITH STENT PLACEMENT  12/05/2006  ? 80% stenosis mLAD and 70% stenosis RCA; Xience V 2.75 x 23 mm DES to mLAD   ? ESOPHAGOGASTRODUODENOSCOPY (EGD) WITH PROPOFOL N/A 05/14/2016  ? Procedure: ESOPHAGOGASTRODUODENOSCOPY (EGD) WITH PROPOFOL;  Surgeon: Lucilla Lame, MD;  Location: [facility];  Service: Endoscopy;  Laterality: N/A;  ? EYE SURGERY    ? GIVENS CAPSULE STUDY N/A 06/01/2015  ? Procedure: GIVENS CAPSULE STUDY;  Surgeon: Lucilla Lame, MD;  Location: [facility];  Service: Endoscopy;  Laterality: N/A;  ? HIATAL HERNIA  REPAIR N/A 09/24/2016  ? Procedure: LAPAROSCOPIC REPAIR OF HIATAL HERNIA WITH FUNDOPLICATION WITH MESH (BIO-A);  Surgeon: Jules Husbands, MD;  Location: [facility] ORS;  Service: General;  Laterality: N/A;  Lithotomy position w arms tucked.   ? pre cancer removed    ? left forearm  ? squamous cell carcinoma surgery    ? back, forehead, left forearm  ? WART FULGURATION N/A 09/22/2020  ? Procedure: FULGURATION ANAL WART;  Surgeon: Robert Bellow, MD;  Location: [facility];  Service: General;  Laterality: N/A;  ? ? ?Current Outpatient Medications:  ?  amiodarone (PACERONE) 200 MG tablet, Take 200 mg by mouth in the  morning., Disp: , Rfl:  ?  atorvastatin (LIPITOR) 80 MG tablet, Take 80 mg by mouth daily after breakfast. , Disp: , Rfl: 0 ?  Cholecalciferol (VITAMIN D3) 50 MCG (2000 UT) TABS, Take 2,000 Units by mouth in the morning., Disp: , Rfl:  ?  FARXIGA 10 MG TABS tablet, Take 10 mg by mouth every morning., Disp: , Rfl:  ?  Ferrous Sulfate (SLOW FE PO), Take 1 tablet by mouth in the morning., Disp: , Rfl:  ?  furosemide (LASIX) 40 MG tablet, Take 40 mg by mouth in the morning., Disp: , Rfl:  ?  lansoprazole (PREVACID) 30 MG capsule, TAKE ONE CAPSULE BY MOUTH DAILY AT 12:00 NOON, Disp: 90 capsule, Rfl: 4 ?  levothyroxine (SYNTHROID) 112 MCG tablet, TAKE 1 TABLET(112 MCG) BY MOUTH DAILY(LABS ARE DUE), Disp: 90 tablet, Rfl: 4 ?  metoprolol succinate (TOPROL-XL) 100 MG 24 hr tablet, Take 100 mg by mouth in the morning., Disp: , Rfl:  ?  Na Sulfate-K Sulfate-Mg Sulf 17.5-3.13-1.6 GM/177ML SOLN, Take 354 mLs by mouth once for 1 dose., Disp: 354 mL, Rfl: 0 ?  olmesartan (BENICAR) 40 MG tablet, Take 40 mg by mouth daily., Disp: , Rfl:  ?  Polyethyl Glycol-Propyl Glycol (SYSTANE) 0.4-0.3 % SOLN, Place 1 drop into both eyes in the morning and at bedtime., Disp: , Rfl:  ?  raloxifene (EVISTA) 60 MG tablet, TAKE 1 TABLET(60 MG) BY MOUTH DAILY (Patient taking differently: Take 60 mg by mouth daily. TAKE 1 TABLET(60 MG) BY MOUTH DAILY), Disp: 90 tablet, Rfl: 4 ?  sitaGLIPtin (JANUVIA) 25 MG tablet, TAKE 1 TABLET(25 MG) BY MOUTH DAILY (Patient taking differently: Take 25 mg by mouth daily. TAKE 1 TABLET(25 MG) BY MOUTH DAILY), Disp: 90 tablet, Rfl: 4 ?  vitamin B-12 (CYANOCOBALAMIN) 1000 MCG tablet, Take 1,000 mcg by mouth in the morning., Disp: , Rfl:  ?  XARELTO 20 MG TABS tablet, Take 20 mg by mouth in the morning., Disp: , Rfl:  ? ? ?Family History  ?Problem Relation Age of Onset  ? Heart disease Mother   ? Kidney failure Mother   ? Hypertension Mother   ? Diabetes Mother   ? Heart attack Father   ? Hypertension Father   ? Thyroid  disease Father   ? Hypertension Sister   ? Diabetes Sister   ? Thyroid disease Daughter   ? Thyroid disease Daughter   ? Breast cancer Paternal Aunt   ?  ? ?Social History  ? ?Tobacco Use  ? Smoking status: Never  ? Smokeless tobacco: Never  ?Vaping Use  ? Vaping Use: Never used  ?Substance Use Topics  ? Alcohol use: Yes  ?  Comment: occassional  ? Drug use: No  ? ? ?Allergies as of 06/07/2021 - Review Complete 06/07/2021  ?Allergen Reaction Noted  ? Sulfa  antibiotics Anaphylaxis and Swelling 06/14/2014  ? Latex Rash 08/07/2014  ? ? ?Review of Systems:    ?All systems reviewed and negative except where noted in HPI. ? ? Physical Exam:  ?BP (!) 147/79 (BP Location: Left Arm, Patient Position: Sitting, Cuff Size: Normal)   Pulse (!) 54   Temp 97.6 ?F (36.4 ?C) (Oral)   Ht 5' 2.13" (1.578 m)   Wt 192 lb 4 oz (87.2 kg)   BMI 35.02 kg/m?  ?No LMP recorded. Patient is postmenopausal. ? ?General:   Alert,  Well-developed, well-nourished, pleasant and cooperative in NAD ?Head:  Normocephalic and atraumatic. ?Eyes:  Sclera clear, no icterus.   Conjunctiva pink. ?Ears:  Normal auditory acuity. ?Nose:  No deformity, discharge, or lesions. ?Mouth:  No deformity or lesions,oropharynx pink & moist. ?Neck:  Supple; no masses or thyromegaly. ?Lungs:  Respirations even and unlabored.  Clear throughout to auscultation.   No wheezes, crackles, or rhonchi. No acute distress. ?Heart:  Regular rate and rhythm; no murmurs, clicks, rubs, or gallops. ?Abdomen:  Normal bowel sounds. Soft, non-tender and non-distended without masses, hepatosplenomegaly or hernias noted.  No guarding or rebound tenderness.   ?Rectal: Not performed ?Msk:  Symmetrical without gross deformities. Good, equal movement & strength bilaterally. ?Pulses:  Normal pulses noted. ?Extremities:  No clubbing or edema.  No cyanosis. ?Neurologic:  Alert and oriented x3;  grossly normal neurologically. ?Skin:  Intact without significant lesions or rashes. No  jaundice. ?Psych:  Alert and cooperative. Normal mood and affect. ? ?Imaging Studies: ?Reviewed ? ?Assessment and Plan:  ? ?Erica Larsen is a 71 y.o. female with history of obesity, A-fib on Xarelto, metabolic syndrome, CKD is s

## 2021-06-11 ENCOUNTER — Telehealth: Payer: Self-pay

## 2021-06-11 DIAGNOSIS — I10 Essential (primary) hypertension: Secondary | ICD-10-CM | POA: Diagnosis not present

## 2021-06-11 DIAGNOSIS — R600 Localized edema: Secondary | ICD-10-CM | POA: Diagnosis not present

## 2021-06-11 DIAGNOSIS — N2581 Secondary hyperparathyroidism of renal origin: Secondary | ICD-10-CM | POA: Diagnosis not present

## 2021-06-11 DIAGNOSIS — E1122 Type 2 diabetes mellitus with diabetic chronic kidney disease: Secondary | ICD-10-CM | POA: Diagnosis not present

## 2021-06-11 DIAGNOSIS — N1832 Chronic kidney disease, stage 3b: Secondary | ICD-10-CM | POA: Diagnosis not present

## 2021-06-11 DIAGNOSIS — K529 Noninfective gastroenteritis and colitis, unspecified: Secondary | ICD-10-CM | POA: Diagnosis not present

## 2021-06-11 NOTE — Telephone Encounter (Signed)
Called and left a message for call back  

## 2021-06-11 NOTE — Telephone Encounter (Signed)
Per cardiology patient needs to stop Xarelto 3 days prior to procedure and restart it 1 day after the procedure.  ?

## 2021-06-11 NOTE — Telephone Encounter (Signed)
Informed patient of this information and she verbalized understanding of instructions  

## 2021-06-15 LAB — PANCREATIC ELASTASE, FECAL: Pancreatic Elastase, Fecal: 347 ug Elast./g (ref >200)

## 2021-06-20 ENCOUNTER — Inpatient Hospital Stay: Payer: Medicare Other | Attending: Internal Medicine

## 2021-06-20 DIAGNOSIS — M1990 Unspecified osteoarthritis, unspecified site: Secondary | ICD-10-CM | POA: Diagnosis not present

## 2021-06-20 DIAGNOSIS — Z8616 Personal history of COVID-19: Secondary | ICD-10-CM | POA: Insufficient documentation

## 2021-06-20 DIAGNOSIS — E1122 Type 2 diabetes mellitus with diabetic chronic kidney disease: Secondary | ICD-10-CM | POA: Diagnosis not present

## 2021-06-20 DIAGNOSIS — N184 Chronic kidney disease, stage 4 (severe): Secondary | ICD-10-CM | POA: Diagnosis not present

## 2021-06-20 DIAGNOSIS — N189 Chronic kidney disease, unspecified: Secondary | ICD-10-CM | POA: Diagnosis not present

## 2021-06-20 DIAGNOSIS — E538 Deficiency of other specified B group vitamins: Secondary | ICD-10-CM | POA: Diagnosis not present

## 2021-06-20 DIAGNOSIS — Z7984 Long term (current) use of oral hypoglycemic drugs: Secondary | ICD-10-CM | POA: Insufficient documentation

## 2021-06-20 DIAGNOSIS — R140 Abdominal distension (gaseous): Secondary | ICD-10-CM | POA: Diagnosis not present

## 2021-06-20 DIAGNOSIS — Z803 Family history of malignant neoplasm of breast: Secondary | ICD-10-CM | POA: Diagnosis not present

## 2021-06-20 DIAGNOSIS — I509 Heart failure, unspecified: Secondary | ICD-10-CM | POA: Insufficient documentation

## 2021-06-20 DIAGNOSIS — I5032 Chronic diastolic (congestive) heart failure: Secondary | ICD-10-CM | POA: Diagnosis not present

## 2021-06-20 DIAGNOSIS — D500 Iron deficiency anemia secondary to blood loss (chronic): Secondary | ICD-10-CM

## 2021-06-20 DIAGNOSIS — I2721 Secondary pulmonary arterial hypertension: Secondary | ICD-10-CM | POA: Diagnosis not present

## 2021-06-20 DIAGNOSIS — E7800 Pure hypercholesterolemia, unspecified: Secondary | ICD-10-CM | POA: Insufficient documentation

## 2021-06-20 DIAGNOSIS — I2510 Atherosclerotic heart disease of native coronary artery without angina pectoris: Secondary | ICD-10-CM | POA: Diagnosis not present

## 2021-06-20 DIAGNOSIS — D509 Iron deficiency anemia, unspecified: Secondary | ICD-10-CM | POA: Insufficient documentation

## 2021-06-20 DIAGNOSIS — M8580 Other specified disorders of bone density and structure, unspecified site: Secondary | ICD-10-CM | POA: Diagnosis not present

## 2021-06-20 DIAGNOSIS — I130 Hypertensive heart and chronic kidney disease with heart failure and stage 1 through stage 4 chronic kidney disease, or unspecified chronic kidney disease: Secondary | ICD-10-CM | POA: Insufficient documentation

## 2021-06-20 DIAGNOSIS — E039 Hypothyroidism, unspecified: Secondary | ICD-10-CM | POA: Insufficient documentation

## 2021-06-20 DIAGNOSIS — Z79899 Other long term (current) drug therapy: Secondary | ICD-10-CM | POA: Diagnosis not present

## 2021-06-20 DIAGNOSIS — D693 Immune thrombocytopenic purpura: Secondary | ICD-10-CM | POA: Diagnosis not present

## 2021-06-20 LAB — BASIC METABOLIC PANEL
Anion gap: 9 (ref 5–15)
BUN: 22 mg/dL (ref 8–23)
CO2: 28 mmol/L (ref 22–32)
Calcium: 8.6 mg/dL — ABNORMAL LOW (ref 8.9–10.3)
Chloride: 103 mmol/L (ref 98–111)
Creatinine, Ser: 2.03 mg/dL — ABNORMAL HIGH (ref 0.44–1.00)
GFR, Estimated: 25 mL/min — ABNORMAL LOW (ref >60)
Glucose, Bld: 104 mg/dL — ABNORMAL HIGH (ref 70–99)
Potassium: 3.6 mmol/L (ref 3.5–5.1)
Sodium: 140 mmol/L (ref 135–145)

## 2021-06-20 LAB — CBC WITH DIFFERENTIAL/PLATELET
Abs Immature Granulocytes: 0.03 10*3/uL (ref 0.00–0.07)
Basophils Absolute: 0 10*3/uL (ref 0.0–0.1)
Basophils Relative: 0 %
Eosinophils Absolute: 0.2 10*3/uL (ref 0.0–0.5)
Eosinophils Relative: 2 %
HCT: 38 % (ref 36.0–46.0)
Hemoglobin: 12.8 g/dL (ref 12.0–15.0)
Immature Granulocytes: 0 %
Lymphocytes Relative: 14 %
Lymphs Abs: 1.5 10*3/uL (ref 0.7–4.0)
MCH: 30.5 pg (ref 26.0–34.0)
MCHC: 33.7 g/dL (ref 30.0–36.0)
MCV: 90.7 fL (ref 80.0–100.0)
Monocytes Absolute: 1.4 10*3/uL — ABNORMAL HIGH (ref 0.1–1.0)
Monocytes Relative: 13 %
Neutro Abs: 7.8 10*3/uL — ABNORMAL HIGH (ref 1.7–7.7)
Neutrophils Relative %: 71 %
Platelets: 134 10*3/uL — ABNORMAL LOW (ref 150–400)
RBC: 4.19 MIL/uL (ref 3.87–5.11)
RDW: 17.2 % — ABNORMAL HIGH (ref 11.5–15.5)
WBC: 10.9 10*3/uL — ABNORMAL HIGH (ref 4.0–10.5)
nRBC: 0 % (ref 0.0–0.2)

## 2021-06-22 ENCOUNTER — Inpatient Hospital Stay: Payer: Medicare Other

## 2021-06-22 ENCOUNTER — Inpatient Hospital Stay (HOSPITAL_BASED_OUTPATIENT_CLINIC_OR_DEPARTMENT_OTHER): Payer: Medicare Other | Admitting: Internal Medicine

## 2021-06-22 ENCOUNTER — Encounter: Payer: Self-pay | Admitting: Internal Medicine

## 2021-06-22 ENCOUNTER — Other Ambulatory Visit: Payer: Self-pay

## 2021-06-22 VITALS — BP 142/49 | HR 55 | Temp 98.1°F | Ht 62.13 in | Wt 189.4 lb

## 2021-06-22 DIAGNOSIS — E7800 Pure hypercholesterolemia, unspecified: Secondary | ICD-10-CM | POA: Diagnosis not present

## 2021-06-22 DIAGNOSIS — R140 Abdominal distension (gaseous): Secondary | ICD-10-CM | POA: Diagnosis not present

## 2021-06-22 DIAGNOSIS — D509 Iron deficiency anemia, unspecified: Secondary | ICD-10-CM | POA: Diagnosis not present

## 2021-06-22 DIAGNOSIS — E039 Hypothyroidism, unspecified: Secondary | ICD-10-CM | POA: Diagnosis not present

## 2021-06-22 DIAGNOSIS — Z803 Family history of malignant neoplasm of breast: Secondary | ICD-10-CM | POA: Diagnosis not present

## 2021-06-22 DIAGNOSIS — I2510 Atherosclerotic heart disease of native coronary artery without angina pectoris: Secondary | ICD-10-CM | POA: Diagnosis not present

## 2021-06-22 DIAGNOSIS — M1990 Unspecified osteoarthritis, unspecified site: Secondary | ICD-10-CM | POA: Diagnosis not present

## 2021-06-22 DIAGNOSIS — D500 Iron deficiency anemia secondary to blood loss (chronic): Secondary | ICD-10-CM

## 2021-06-22 DIAGNOSIS — I509 Heart failure, unspecified: Secondary | ICD-10-CM | POA: Diagnosis not present

## 2021-06-22 DIAGNOSIS — Z7984 Long term (current) use of oral hypoglycemic drugs: Secondary | ICD-10-CM | POA: Diagnosis not present

## 2021-06-22 DIAGNOSIS — M8580 Other specified disorders of bone density and structure, unspecified site: Secondary | ICD-10-CM | POA: Diagnosis not present

## 2021-06-22 DIAGNOSIS — I2721 Secondary pulmonary arterial hypertension: Secondary | ICD-10-CM | POA: Diagnosis not present

## 2021-06-22 DIAGNOSIS — Z79899 Other long term (current) drug therapy: Secondary | ICD-10-CM | POA: Diagnosis not present

## 2021-06-22 DIAGNOSIS — Z8616 Personal history of COVID-19: Secondary | ICD-10-CM | POA: Diagnosis not present

## 2021-06-22 DIAGNOSIS — I5032 Chronic diastolic (congestive) heart failure: Secondary | ICD-10-CM | POA: Diagnosis not present

## 2021-06-22 DIAGNOSIS — E538 Deficiency of other specified B group vitamins: Secondary | ICD-10-CM | POA: Diagnosis not present

## 2021-06-22 DIAGNOSIS — I130 Hypertensive heart and chronic kidney disease with heart failure and stage 1 through stage 4 chronic kidney disease, or unspecified chronic kidney disease: Secondary | ICD-10-CM | POA: Diagnosis not present

## 2021-06-22 DIAGNOSIS — N184 Chronic kidney disease, stage 4 (severe): Secondary | ICD-10-CM | POA: Diagnosis not present

## 2021-06-22 DIAGNOSIS — N189 Chronic kidney disease, unspecified: Secondary | ICD-10-CM | POA: Diagnosis not present

## 2021-06-22 DIAGNOSIS — E1122 Type 2 diabetes mellitus with diabetic chronic kidney disease: Secondary | ICD-10-CM | POA: Diagnosis not present

## 2021-06-22 DIAGNOSIS — D693 Immune thrombocytopenic purpura: Secondary | ICD-10-CM | POA: Diagnosis not present

## 2021-06-22 MED ORDER — SODIUM CHLORIDE 0.9 % IV SOLN
Freq: Once | INTRAVENOUS | Status: AC
  Filled 2021-06-22: qty 250

## 2021-06-22 MED ORDER — SODIUM CHLORIDE 0.9 % IV SOLN: 200.0000 mg | Freq: Once | INTRAVENOUS | Status: DC

## 2021-06-22 MED ORDER — IRON SUCROSE 20 MG/ML IV SOLN
200.0000 mg | Freq: Once | INTRAVENOUS | Status: AC
  Administered 2021-06-22: 200 mg via INTRAVENOUS
  Filled 2021-06-22: qty 10

## 2021-06-22 NOTE — Patient Instructions (Signed)
[facility]  Discharge Instructions: ?Thank you for choosing [facility] to provide your oncology and hematology care.  ?If you have a lab appointment with the Cancer Center, please go directly to the Cancer Center and check in at the registration area. ? ?Wear comfortable clothing and clothing appropriate for easy access to any Portacath or PICC line.  ? ?We strive to give you quality time with your provider. You may need to reschedule your appointment if you arrive late (15 or more minutes).  Arriving late affects you and other patients whose appointments are after yours.  Also, if you miss three or more appointments without notifying the office, you may be dismissed from the clinic at the provider?s discretion.    ?  ?For prescription refill requests, have your pharmacy contact our office and allow 72 hours for refills to be completed.   ? ?Today you received the following chemotherapy and/or immunotherapy agents VENOFER ?    ?  ?To help prevent nausea and vomiting after your treatment, we encourage you to take your nausea medication as directed. ? ?BELOW ARE SYMPTOMS THAT SHOULD BE REPORTED IMMEDIATELY: ?*FEVER GREATER THAN 100.4 F (38 ?C) OR HIGHER ?*CHILLS OR SWEATING ?*NAUSEA AND VOMITING THAT IS NOT CONTROLLED WITH YOUR NAUSEA MEDICATION ?*UNUSUAL SHORTNESS OF BREATH ?*UNUSUAL BRUISING OR BLEEDING ?*URINARY PROBLEMS (pain or burning when urinating, or frequent urination) ?*BOWEL PROBLEMS (unusual diarrhea, constipation, pain near the anus) ?TENDERNESS IN MOUTH AND THROAT WITH OR WITHOUT PRESENCE OF ULCERS (sore throat, sores in mouth, or a toothache) ?UNUSUAL RASH, SWELLING OR PAIN  ?UNUSUAL VAGINAL DISCHARGE OR ITCHING  ? ?Items with * indicate a potential emergency and should be followed up as soon as possible or go to the Emergency Department if any problems should occur. ? ?Please show the CHEMOTHERAPY ALERT CARD or IMMUNOTHERAPY ALERT CARD at check-in to  the Emergency Department and triage nurse. ? ?Should you have questions after your visit or need to cancel or reschedule your appointment, please contact [facility]  [phone] and follow the prompts.  Office hours are 8:00 a.m. to 4:30 p.m. Monday - Friday. Please note that voicemails left after 4:00 p.m. may not be returned until the following business day.  We are closed weekends and major holidays. You have access to a nurse at all times for urgent questions. Please call the main number to the clinic [phone] and follow the prompts. ? ?For any non-urgent questions, you may also contact your provider using MyChart. We now offer e-Visits for anyone 18 and older to request care online for non-urgent symptoms. For details visit [email or web address]. ?  ?Also download the MyChart app! Go to the app store, search "MyChart", open the app, select [facility], and log in with your MyChart username and password. ? ?Due to Covid, a mask is required upon entering the hospital/clinic. If you do not have a mask, one will be given to you upon arrival. For doctor visits, patients may have 1 support person aged 18 or older with them. For treatment visits, patients cannot have anyone with them due to current Covid guidelines and our immunocompromised population.  ? ?Iron Sucrose Injection ?What is this medication? ?IRON SUCROSE (EYE ern SOO krose) treats low levels of iron (iron deficiency anemia) in people with kidney disease. Iron is a mineral that plays an important role in making red blood cells, which carry oxygen from your lungs to the rest of your body. ?This medicine   may be used for other purposes; ask your health care provider or pharmacist if you have questions. ?COMMON BRAND NAME(S): Venofer ?What should I tell my care team before I take this medication? ?They need to know if you have any of these conditions: ?Anemia not caused by low iron levels ?Heart disease ?High levels of  iron in the blood ?Kidney disease ?Liver disease ?An unusual or allergic reaction to iron, other medications, foods, dyes, or preservatives ?Pregnant or trying to get pregnant ?Breast-feeding ?How should I use this medication? ?This medication is for infusion into a vein. It is given in a hospital or clinic setting. ?Talk to your care team about the use of this medication in children. While this medication may be prescribed for children as young as 2 years for selected conditions, precautions do apply. ?Overdosage: If you think you have taken too much of this medicine contact a poison control center or emergency room at once. ?NOTE: This medicine is only for you. Do not share this medicine with others. ?What if I miss a dose? ?It is important not to miss your dose. Call your care team if you are unable to keep an appointment. ?What may interact with this medication? ?Do not take this medication with any of the following: ?Deferoxamine ?Dimercaprol ?Other iron products ?This medication may also interact with the following: ?Chloramphenicol ?Deferasirox ?This list may not describe all possible interactions. Give your health care provider a list of all the medicines, herbs, non-prescription drugs, or dietary supplements you use. Also tell them if you smoke, drink alcohol, or use illegal drugs. Some items may interact with your medicine. ?What should I watch for while using this medication? ?Visit your care team regularly. Tell your care team if your symptoms do not start to get better or if they get worse. You may need blood work done while you are taking this medication. ?You may need to follow a special diet. Talk to your care team. Foods that contain iron include: whole grains/cereals, dried fruits, beans, or peas, leafy green vegetables, and organ meats (liver, kidney). ?What side effects may I notice from receiving this medication? ?Side effects that you should report to your care team as soon as  possible: ?Allergic reactions--skin rash, itching, hives, swelling of the face, lips, tongue, or throat ?Low blood pressure--dizziness, feeling faint or lightheaded, blurry vision ?Shortness of breath ?Side effects that usually do not require medical attention (report to your care team if they continue or are bothersome): ?Flushing ?Headache ?Joint pain ?Muscle pain ?Nausea ?Pain, redness, or irritation at injection site ?This list may not describe all possible side effects. Call your doctor for medical advice about side effects. You may report side effects to FDA at 1-800-FDA-1088. ?Where should I keep my medication? ?This medication is given in a hospital or clinic and will not be stored at home. ?NOTE: This sheet is a summary. It may not cover all possible information. If you have questions about this medicine, talk to your doctor, pharmacist, or health care provider. ?? 2023 Elsevier/Gold Standard (2020-06-30 00:00:00) ? ?

## 2021-06-22 NOTE — Assessment & Plan Note (Addendum)
Iron deficiency anemia- unclear etiology; question GI blood loss/CKD [hx large hiatal hernia status post fundoplication].   ? ?# Hemoglobin today is 12.5- Jan 2023- iron sat- 7%%; ferertin-57 Proceed with infusion today.  We will hold off subsequent infusions at this time.  ? ?# B12 deficiency-low B12-June 2021; continue sublingual B12; HOLD off B12 injection; check B12 levels ? ?# Abdominal discomfort [Colo/EGD on May 10th]- Dr.Vanga.   ? ?# CHF [Dr.Khan]/ CKD-IV sec to DM [Dr.Singh]-slightly worsened GFR but overall stable.  Continue follow-up with Dr. Candiss Norse. ? ?# ITP- Mild thrombocytopenia platelets >100  todayintermittent [2018]- STABLE ? ?# DISPOSITION:   ?# IV venofer NO B12 injection. ?#  follow up in 4 months--lasb- cbc/bmp/iorn studies/ferritin; B12 levels--possible venofer Dr.B ? ?Cc; Dr.Khan/Singh ?

## 2021-06-22 NOTE — Progress Notes (Signed)
[facility] ?CONSULT NOTE ? ?Patient Care Team: ?Venita Lick, NP as PCP - General (Nurse Practitioner) ?Dionisio David, MD as Consulting Physician (Cardiology) ?Cammie Sickle, MD as Consulting Physician (Internal Medicine) ? ?CHIEF COMPLAINTS/PURPOSE OF CONSULTATION:  ? ?# 2014- Iron def Anemia-  [Sep 2014 EGD/COLO- NEG/ferritin 6; Dr.Wohl]. 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  ? ?# Hx CHF/CAD- asprin-plavix; Sep 2018- s/p hiatal hernia-fundoplication [Dr.Pabon] ? ?HISTORY OF PRESENTING ILLNESS: Ambulating independently.  Alone. ? ?Erica Larsen 71 y.o.  female patient with above history of iron deficiency anemia-secondary to CKD CHF on intermittent venofer is here for follow-up. ? ?Patient noted to have abdominal discomfort/bloating.  She is currently being evaluated by GI for endoscopy/next week.  ? ?Patient denies any blood in stools or black or stools.  No nausea or vomiting.  Complains of extreme fatigue.  Denies any swelling in the legs. ? ?.Review of Systems  ?Constitutional:  Positive for malaise/fatigue. Negative for chills, diaphoresis, fever and weight loss.  ?HENT:  Negative for nosebleeds and sore throat.   ?Eyes:  Negative for double vision.  ?Respiratory:  Negative for cough, hemoptysis, sputum production, shortness of breath and wheezing.   ?Cardiovascular:  Negative for chest pain, palpitations, orthopnea and leg swelling.  ?Gastrointestinal:  Negative for abdominal pain, blood in stool, constipation, diarrhea, heartburn, melena, nausea and vomiting.  ?Genitourinary:  Negative for dysuria, frequency and urgency.  ?Musculoskeletal:  Positive for back pain and joint pain.  ?Skin: Negative.  Negative for itching and rash.  ?Neurological:  Negative for dizziness, tingling, focal weakness, weakness and headaches.  ?Endo/Heme/Allergies:  Does not  bruise/bleed easily.  ?Psychiatric/Behavioral:  Negative for depression. The patient is not nervous/anxious and does not have insomnia.   ? ? ?MEDICAL HISTORY:  ?Past Medical History:  ?Diagnosis Date  ? A-fib ([place])   ? Anemia of chronic disease 2017  ? Chronic diastolic HF (heart failure) ([place])   ? CKD (chronic kidney disease), stage IV ([place])   ? Coronary artery disease   ? a.) PCI on 12/05/2006 - 80% stenosis mLAD and 70% stenosis RCA; Xience V 2.75 x 23 mm DES to mLAD   ? COVID-19 virus detected 08/14/2018  ? Diverticulosis   ? Esophagitis   ? GERD (gastroesophageal reflux disease)   ? Grade III diastolic dysfunction   ? Hiatal hernia   ? High cholesterol   ? Hypercalcemia   ? Hypertension   ? Hypothyroidism   ? LBBB (left bundle branch block)   ? Lumbar radiculopathy   ? Obesity   ? Osteoarthritis   ? Osteopenia   ? Hips  ? PAH (pulmonary artery hypertension) ([employer])   ? mild per 07/14/2018 TTE  ? Pneumonia   ? T2DM (type 2 diabetes mellitus) ([name])   ? Valvular insufficiency   ? a. MODERATE PV, TV, MV regurgitation on 07/14/2018 TTE  ? ? ?SURGICAL HISTORY: ?Past Surgical History:  ?Procedure Laterality Date  ? CATARACT EXTRACTION Bilateral   ? May - June 2018  ? COLONOSCOPY WITH PROPOFOL N/A 08/01/2015  ? Procedure: COLONOSCOPY WITH PROPOFOL;  Surgeon: Lucilla Lame, MD;  Location: [facility] ENDOSCOPY;  Service: Endoscopy;  Laterality: N/A;  ? CORONARY ANGIOPLASTY WITH STENT PLACEMENT  12/05/2006  ? 80% stenosis mLAD and 70% stenosis RCA; Xience V 2.75 x 23 mm DES to mLAD   ? ESOPHAGOGASTRODUODENOSCOPY (EGD) WITH PROPOFOL N/A 05/14/2016  ? Procedure: ESOPHAGOGASTRODUODENOSCOPY (EGD) WITH  PROPOFOL;  Surgeon: Lucilla Lame, MD;  Location: [facility] ENDOSCOPY;  Service: Endoscopy;  Laterality: N/A;  ? EYE SURGERY    ? GIVENS CAPSULE STUDY N/A 06/01/2015  ? Procedure: GIVENS CAPSULE STUDY;  Surgeon: Lucilla Lame, MD;  Location: [facility] ENDOSCOPY;  Service: Endoscopy;  Laterality: N/A;  ? HIATAL HERNIA REPAIR N/A 09/24/2016  ? Procedure:  LAPAROSCOPIC REPAIR OF HIATAL HERNIA WITH FUNDOPLICATION WITH MESH (BIO-A);  Surgeon: Jules Husbands, MD;  Location: [facility] ORS;  Service: General;  Laterality: N/A;  Lithotomy position w arms tucked.   ? pre cancer removed    ? left forearm  ? squamous cell carcinoma surgery    ? back, forehead, left forearm  ? WART FULGURATION N/A 09/22/2020  ? Procedure: FULGURATION ANAL WART;  Surgeon: Robert Bellow, MD;  Location: [facility] ORS;  Service: General;  Laterality: N/A;  ? ? ?SOCIAL HISTORY: ?Social History  ? ?Socioeconomic History  ? Marital status: Married  ?  Spouse name: Jenny Reichmann  ? Number of children: 2  ? Years of education: Not on file  ? Highest education level: Not on file  ?Occupational History  ? Occupation: retired  ?Tobacco Use  ? Smoking status: Never  ? Smokeless tobacco: Never  ?Vaping Use  ? Vaping Use: Never used  ?Substance and Sexual Activity  ? Alcohol use: Yes  ?  Comment: occassional  ? Drug use: No  ? Sexual activity: Yes  ?  Birth control/protection: Post-menopausal  ?Other Topics Concern  ? Not on file  ?Social History Narrative  ? Not on file  ? ?Social Determinants of Health  ? ?Financial Resource Strain: Low Risk   ? Difficulty of Paying Living Expenses: Not hard at all  ?Food Insecurity: No Food Insecurity  ? Worried About Charity fundraiser in the Last Year: Never true  ? Ran Out of Food in the Last Year: Never true  ?Transportation Needs: No Transportation Needs  ? Lack of Transportation (Medical): No  ? Lack of Transportation (Non-Medical): No  ?Physical Activity: Inactive  ? Days of Exercise per Week: 0 days  ? Minutes of Exercise per Session: 0 min  ?Stress: No Stress Concern Present  ? Feeling of Stress : Not at all  ?Social Connections: Not on file  ?Intimate Partner Violence: Not on file  ? ? ?FAMILY HISTORY: ?Family History  ?Problem Relation Age of Onset  ? Heart disease Mother   ? Kidney failure Mother   ? Hypertension Mother   ? Diabetes Mother   ? Heart attack Father   ?  Hypertension Father   ? Thyroid disease Father   ? Hypertension Sister   ? Diabetes Sister   ? Thyroid disease Daughter   ? Thyroid disease Daughter   ? Breast cancer Paternal Aunt   ? ? ?ALLERGIES:  is allergic to sulfa antibiotics and latex. ? ?MEDICATIONS:  ?Current Outpatient Medications  ?Medication Sig Dispense Refill  ? amiodarone (PACERONE) 200 MG tablet Take 200 mg by mouth in the morning.    ? atorvastatin (LIPITOR) 80 MG tablet Take 80 mg by mouth daily after breakfast.   0  ? Cholecalciferol (VITAMIN D3) 50 MCG (2000 UT) TABS Take 2,000 Units by mouth in the morning.    ? FARXIGA 10 MG TABS tablet Take 10 mg by mouth every morning.    ? Ferrous Sulfate (SLOW FE PO) Take 1 tablet by mouth in the morning.    ? furosemide (LASIX) 40 MG tablet Take 40 mg by  mouth in the morning.    ? lansoprazole (PREVACID) 30 MG capsule TAKE ONE CAPSULE BY MOUTH DAILY AT 12:00 NOON 90 capsule 4  ? levothyroxine (SYNTHROID) 112 MCG tablet TAKE 1 TABLET(112 MCG) BY MOUTH DAILY(LABS ARE DUE) 90 tablet 4  ? metoprolol succinate (TOPROL-XL) 100 MG 24 hr tablet Take 100 mg by mouth in the morning.    ? olmesartan (BENICAR) 40 MG tablet Take 40 mg by mouth daily.    ? Polyethyl Glycol-Propyl Glycol (SYSTANE) 0.4-0.3 % SOLN Place 1 drop into both eyes in the morning and at bedtime.    ? raloxifene (EVISTA) 60 MG tablet TAKE 1 TABLET(60 MG) BY MOUTH DAILY (Patient taking differently: Take 60 mg by mouth daily. TAKE 1 TABLET(60 MG) BY MOUTH DAILY) 90 tablet 4  ? sitaGLIPtin (JANUVIA) 25 MG tablet TAKE 1 TABLET(25 MG) BY MOUTH DAILY (Patient taking differently: Take 25 mg by mouth daily. TAKE 1 TABLET(25 MG) BY MOUTH DAILY) 90 tablet 4  ? vitamin B-12 (CYANOCOBALAMIN) 1000 MCG tablet Take 1,000 mcg by mouth in the morning.    ? XARELTO 20 MG TABS tablet Take 20 mg by mouth in the morning.    ? ?No current facility-administered medications for this visit.  ? ? ?  ?. ? ?PHYSICAL EXAMINATION: ?ECOG PERFORMANCE STATUS: 1 - Symptomatic but  completely ambulatory ? ?Vitals:  ? 06/22/21 1258  ?BP: (!) 142/49  ?Pulse: (!) 55  ?Temp: 98.1 ?F (36.7 ?C)  ?SpO2: 100%  ? ?Filed Weights  ? 06/22/21 1258  ?Weight: 189 lb 6.4 oz (85.9 kg)  ? ? ?Physical Exam ?HENT:  ?

## 2021-06-27 ENCOUNTER — Other Ambulatory Visit: Payer: Self-pay

## 2021-06-27 ENCOUNTER — Encounter
Admission: RE | Disposition: A | Discharge status: Home / Self Care | Payer: Self-pay | Source: Home / Self Care | Attending: Gastroenterology

## 2021-06-27 ENCOUNTER — Ambulatory Visit: Payer: Medicare Other | Admitting: Certified Registered"

## 2021-06-27 ENCOUNTER — Ambulatory Visit
Admission: RE | Admit: 2021-06-27 | Discharge: 2021-06-27 | Disposition: A | Discharge status: Home / Self Care | Payer: Medicare Other | Attending: Gastroenterology | Admitting: Gastroenterology

## 2021-06-27 ENCOUNTER — Encounter: Payer: Self-pay | Admitting: Gastroenterology

## 2021-06-27 DIAGNOSIS — I4891 Unspecified atrial fibrillation: Secondary | ICD-10-CM | POA: Diagnosis not present

## 2021-06-27 DIAGNOSIS — I2510 Atherosclerotic heart disease of native coronary artery without angina pectoris: Secondary | ICD-10-CM | POA: Insufficient documentation

## 2021-06-27 DIAGNOSIS — K635 Polyp of colon: Secondary | ICD-10-CM | POA: Diagnosis not present

## 2021-06-27 DIAGNOSIS — Z7901 Long term (current) use of anticoagulants: Secondary | ICD-10-CM | POA: Diagnosis not present

## 2021-06-27 DIAGNOSIS — N184 Chronic kidney disease, stage 4 (severe): Secondary | ICD-10-CM | POA: Diagnosis not present

## 2021-06-27 DIAGNOSIS — K219 Gastro-esophageal reflux disease without esophagitis: Secondary | ICD-10-CM | POA: Diagnosis not present

## 2021-06-27 DIAGNOSIS — Z7984 Long term (current) use of oral hypoglycemic drugs: Secondary | ICD-10-CM | POA: Insufficient documentation

## 2021-06-27 DIAGNOSIS — I8500 Esophageal varices without bleeding: Secondary | ICD-10-CM | POA: Insufficient documentation

## 2021-06-27 DIAGNOSIS — K3189 Other diseases of stomach and duodenum: Secondary | ICD-10-CM | POA: Diagnosis not present

## 2021-06-27 DIAGNOSIS — K2950 Unspecified chronic gastritis without bleeding: Secondary | ICD-10-CM | POA: Diagnosis not present

## 2021-06-27 DIAGNOSIS — I5032 Chronic diastolic (congestive) heart failure: Secondary | ICD-10-CM | POA: Diagnosis not present

## 2021-06-27 DIAGNOSIS — Z79899 Other long term (current) drug therapy: Secondary | ICD-10-CM | POA: Diagnosis not present

## 2021-06-27 DIAGNOSIS — Z955 Presence of coronary angioplasty implant and graft: Secondary | ICD-10-CM | POA: Insufficient documentation

## 2021-06-27 DIAGNOSIS — K317 Polyp of stomach and duodenum: Secondary | ICD-10-CM | POA: Insufficient documentation

## 2021-06-27 DIAGNOSIS — I130 Hypertensive heart and chronic kidney disease with heart failure and stage 1 through stage 4 chronic kidney disease, or unspecified chronic kidney disease: Secondary | ICD-10-CM | POA: Diagnosis not present

## 2021-06-27 DIAGNOSIS — D500 Iron deficiency anemia secondary to blood loss (chronic): Secondary | ICD-10-CM | POA: Diagnosis not present

## 2021-06-27 DIAGNOSIS — E039 Hypothyroidism, unspecified: Secondary | ICD-10-CM | POA: Insufficient documentation

## 2021-06-27 DIAGNOSIS — E7800 Pure hypercholesterolemia, unspecified: Secondary | ICD-10-CM | POA: Diagnosis not present

## 2021-06-27 DIAGNOSIS — K5730 Diverticulosis of large intestine without perforation or abscess without bleeding: Secondary | ICD-10-CM | POA: Diagnosis not present

## 2021-06-27 DIAGNOSIS — Z6834 Body mass index (BMI) 34.0-34.9, adult: Secondary | ICD-10-CM | POA: Insufficient documentation

## 2021-06-27 DIAGNOSIS — K644 Residual hemorrhoidal skin tags: Secondary | ICD-10-CM | POA: Insufficient documentation

## 2021-06-27 DIAGNOSIS — K449 Diaphragmatic hernia without obstruction or gangrene: Secondary | ICD-10-CM | POA: Diagnosis not present

## 2021-06-27 DIAGNOSIS — E1122 Type 2 diabetes mellitus with diabetic chronic kidney disease: Secondary | ICD-10-CM | POA: Diagnosis not present

## 2021-06-27 DIAGNOSIS — K529 Noninfective gastroenteritis and colitis, unspecified: Secondary | ICD-10-CM | POA: Insufficient documentation

## 2021-06-27 DIAGNOSIS — D122 Benign neoplasm of ascending colon: Secondary | ICD-10-CM | POA: Diagnosis not present

## 2021-06-27 HISTORY — PX: COLONOSCOPY WITH PROPOFOL: SHX5780

## 2021-06-27 HISTORY — PX: ESOPHAGOGASTRODUODENOSCOPY (EGD) WITH PROPOFOL: SHX5813

## 2021-06-27 SURGERY — COLONOSCOPY WITH PROPOFOL
Anesthesia: General

## 2021-06-27 MED ORDER — EPHEDRINE SULFATE (PRESSORS) 50 MG/ML IJ SOLN
INTRAMUSCULAR | Status: DC | PRN
  Administered 2021-06-27: 5 mg via INTRAVENOUS
  Administered 2021-06-27 (×2): 10 mg via INTRAVENOUS

## 2021-06-27 MED ORDER — PROPOFOL 10 MG/ML IV BOLUS
INTRAVENOUS | Status: DC | PRN
  Administered 2021-06-27: 70 mg via INTRAVENOUS

## 2021-06-27 MED ORDER — PHENYLEPHRINE HCL (PRESSORS) 10 MG/ML IV SOLN
INTRAVENOUS | Status: DC | PRN
  Administered 2021-06-27: 80 ug via INTRAVENOUS

## 2021-06-27 MED ORDER — GLYCOPYRROLATE 0.2 MG/ML IJ SOLN
INTRAMUSCULAR | Status: DC | PRN
Start: 2021-06-27 — End: 2021-06-27
  Administered 2021-06-27: .2 mg via INTRAVENOUS

## 2021-06-27 MED ORDER — SODIUM CHLORIDE 0.9 % IV SOLN: INTRAVENOUS | Status: DC

## 2021-06-27 MED ORDER — PROPOFOL 500 MG/50ML IV EMUL
INTRAVENOUS | Status: DC | PRN
  Administered 2021-06-27: 130 ug/kg/min via INTRAVENOUS

## 2021-06-27 NOTE — H&P (Signed)
?Erica Darby, MD ?[address]  ?[address]  ?[place], [place] [address]  ?Main: [phone]  ?Fax: [phone] ?Pager: [phone] ? ?Primary Care Physician:  Venita Lick, NP ?Primary Gastroenterologist:  Dr. Cephas Larsen ? ?Pre-Procedure History & Physical: ?HPI:  Erica Larsen is a 71 y.o. female is here for an endoscopy and colonoscopy. ?  ?Past Medical History:  ?Diagnosis Date  ? A-fib ([place])   ? Anemia of chronic disease 2017  ? Chronic diastolic HF (heart failure) ([place])   ? CKD (chronic kidney disease), stage IV ([place])   ? Coronary artery disease   ? a.) PCI on 12/05/2006 - 80% stenosis mLAD and 70% stenosis RCA; Xience V 2.75 x 23 mm DES to mLAD   ? COVID-19 virus detected 08/14/2018  ? Diverticulosis   ? Esophagitis   ? GERD (gastroesophageal reflux disease)   ? Grade III diastolic dysfunction   ? Hiatal hernia   ? High cholesterol   ? Hypercalcemia   ? Hypertension   ? Hypothyroidism   ? LBBB (left bundle branch block)   ? Lumbar radiculopathy   ? Obesity   ? Osteoarthritis   ? Osteopenia   ? Hips  ? PAH (pulmonary artery hypertension) ([place])   ? mild per 07/14/2018 TTE  ? Pneumonia   ? T2DM (type 2 diabetes mellitus) ([place])   ? Valvular insufficiency   ? a. MODERATE PV, TV, MV regurgitation on 07/14/2018 TTE  ? ? ?Past Surgical History:  ?Procedure Laterality Date  ? CATARACT EXTRACTION Bilateral   ? May - June 2018  ? COLONOSCOPY WITH PROPOFOL N/A 08/01/2015  ? Procedure: COLONOSCOPY WITH PROPOFOL;  Surgeon: Lucilla Lame, MD;  Location: [facility] ENDOSCOPY;  Service: Endoscopy;  Laterality: N/A;  ? CORONARY ANGIOPLASTY WITH STENT PLACEMENT  12/05/2006  ? 80% stenosis mLAD and 70% stenosis RCA; Xience V 2.75 x 23 mm DES to mLAD   ? ESOPHAGOGASTRODUODENOSCOPY (EGD) WITH PROPOFOL N/A 05/14/2016  ? Procedure: ESOPHAGOGASTRODUODENOSCOPY (EGD) WITH PROPOFOL;  Surgeon: Lucilla Lame, MD;  Location: [facility] ENDOSCOPY;  Service: Endoscopy;  Laterality: N/A;  ? EYE SURGERY    ? GIVENS CAPSULE STUDY N/A 06/01/2015   ? Procedure: GIVENS CAPSULE STUDY;  Surgeon: Lucilla Lame, MD;  Location: [facility] ENDOSCOPY;  Service: Endoscopy;  Laterality: N/A;  ? HIATAL HERNIA REPAIR N/A 09/24/2016  ? Procedure: LAPAROSCOPIC REPAIR OF HIATAL HERNIA WITH FUNDOPLICATION WITH MESH (BIO-A);  Surgeon: Jules Husbands, MD;  Location: [facility] ORS;  Service: General;  Laterality: N/A;  Lithotomy position w arms tucked.   ? pre cancer removed    ? left forearm  ? squamous cell carcinoma surgery    ? back, forehead, left forearm  ? WART FULGURATION N/A 09/22/2020  ? Procedure: FULGURATION ANAL WART;  Surgeon: Robert Bellow, MD;  Location: [facility] ORS;  Service: General;  Laterality: N/A;  ? ? ?Prior to Admission medications   ?Medication Sig Start Date End Date Taking? Authorizing Provider  ?amiodarone (PACERONE) 200 MG tablet Take 200 mg by mouth in the morning. 02/10/19  Yes [provider]  ?atorvastatin (LIPITOR) 80 MG tablet Take 80 mg by mouth daily after breakfast.  09/06/16  Yes [provider]  ?Cholecalciferol (VITAMIN D3) 50 MCG (2000 UT) TABS Take 2,000 Units by mouth in the morning.   Yes [provider]  ?FARXIGA 10 MG TABS tablet Take 10 mg by mouth every morning. 01/10/21  Yes [provider]  ?Ferrous Sulfate (SLOW FE PO) Take 1 tablet  by mouth in the morning.   Yes [provider]  ?furosemide (LASIX) 40 MG tablet Take 40 mg by mouth in the morning. 07/13/19  Yes [provider]  ?lansoprazole (PREVACID) 30 MG capsule TAKE ONE CAPSULE BY MOUTH DAILY AT 12:00 NOON 10/06/20  Yes Cannady, Jolene T, NP  ?levothyroxine (SYNTHROID) 112 MCG tablet TAKE 1 TABLET(112 MCG) BY MOUTH DAILY(LABS ARE DUE) 04/24/21  Yes Cannady, Henrine Screws T, NP  ?metoprolol succinate (TOPROL-XL) 100 MG 24 hr tablet Take 100 mg by mouth in the morning. 07/14/18  Yes [provider]  ?olmesartan (BENICAR) 40 MG tablet Take 40 mg by mouth daily. 05/04/20  Yes [provider]  ?Polyethyl Glycol-Propyl Glycol (SYSTANE)  0.4-0.3 % SOLN Place 1 drop into both eyes in the morning and at bedtime.   Yes [provider]  ?raloxifene (EVISTA) 60 MG tablet TAKE 1 TABLET(60 MG) BY MOUTH DAILY ?Patient taking differently: Take 60 mg by mouth daily. TAKE 1 TABLET(60 MG) BY MOUTH DAILY 09/05/20  Yes Cannady, Jolene T, NP  ?sitaGLIPtin (JANUVIA) 25 MG tablet TAKE 1 TABLET(25 MG) BY MOUTH DAILY ?Patient taking differently: Take 25 mg by mouth daily. TAKE 1 TABLET(25 MG) BY MOUTH DAILY 09/05/20  Yes Cannady, Jolene T, NP  ?vitamin B-12 (CYANOCOBALAMIN) 1000 MCG tablet Take 1,000 mcg by mouth in the morning.   Yes [provider]  ?XARELTO 20 MG TABS tablet Take 20 mg by mouth in the morning. 08/04/18   [provider]  ? ? ?Allergies as of 06/07/2021 - Review Complete 06/07/2021  ?Allergen Reaction Noted  ? Sulfa antibiotics Anaphylaxis and Swelling 06/14/2014  ? Latex Rash 08/07/2014  ? ? ?Family History  ?Problem Relation Age of Onset  ? Heart disease Mother   ? Kidney failure Mother   ? Hypertension Mother   ? Diabetes Mother   ? Heart attack Father   ? Hypertension Father   ? Thyroid disease Father   ? Hypertension Sister   ? Diabetes Sister   ? Thyroid disease Daughter   ? Thyroid disease Daughter   ? Breast cancer Paternal Aunt   ? ? ?Social History  ? ?Socioeconomic History  ? Marital status: Married  ?  Spouse name: Erica Larsen  ? Number of children: 2  ? Years of education: Not on file  ? Highest education level: Not on file  ?Occupational History  ? Occupation: retired  ?Tobacco Use  ? Smoking status: Never  ? Smokeless tobacco: Never  ?Vaping Use  ? Vaping Use: Never used  ?Substance and Sexual Activity  ? Alcohol use: Yes  ?  Comment: occassional  ? Drug use: No  ? Sexual activity: Yes  ?  Birth control/protection: Post-menopausal  ?Other Topics Concern  ? Not on file  ?Social History Narrative  ? Not on file  ? ?Social Determinants of Health  ? ?Financial Resource Strain: Low Risk   ? Difficulty of Paying Living  Expenses: Not hard at all  ?Food Insecurity: No Food Insecurity  ? Worried About Charity fundraiser in the Last Year: Never true  ? Ran Out of Food in the Last Year: Never true  ?Transportation Needs: No Transportation Needs  ? Lack of Transportation (Medical): No  ? Lack of Transportation (Non-Medical): No  ?Physical Activity: Inactive  ? Days of Exercise per Week: 0 days  ? Minutes of Exercise per Session: 0 min  ?Stress: No Stress Concern Present  ? Feeling of Stress : Not at all  ?Social  Connections: Not on file  ?Intimate Partner Violence: Not on file  ? ? ?Review of Systems: ?See HPI, otherwise negative ROS ? ?Physical Exam: ?BP 137/63   Pulse (!) 59   Temp (!) 96.5 ?F (35.8 ?C) (Temporal)   Resp 16   Ht '5\' 2"'$  (1.575 m)   Wt 84.4 kg   SpO2 98%   BMI 34.02 kg/m?  ?General:   Alert,  pleasant and cooperative in NAD ?Head:  Normocephalic and atraumatic. ?Neck:  Supple; no masses or thyromegaly. ?Lungs:  Clear throughout to auscultation.    ?Heart:  Regular rate and rhythm. ?Abdomen:  Soft, nontender and nondistended. Normal bowel sounds, without guarding, and without rebound.   ?Neurologic:  Alert and  oriented x4;  grossly normal neurologically. ? ?Impression/Plan: ?Angelli Baruch Cuccia is here for an endoscopy and colonoscopy to be performed for history of chronic nonbloody diarrhea with abdominal bloating and epigastric discomfort, also has history of fatty liver, iron deficiency anemia ? ?Risks, benefits, limitations, and alternatives regarding  endoscopy and colonoscopy have been reviewed with the patient.  Questions have been answered.  All parties agreeable. ? ? ?Sherri Sear, MD  06/27/2021, 9:19 AM ?

## 2021-06-27 NOTE — Op Note (Signed)
[facility] ?Gastroenterology ?Patient Name: Erica Larsen ?Procedure Date: 06/27/2021 9:21 AM ?MRN: [id number] ?Account #: [id number] ?Date of Birth: 1950/08/16 ?Admit Type: Outpatient ?Age: 71 ?Room: [facility] ENDO ROOM 4 ?Gender: Female ?Note Status: Finalized ?Instrument Name: Colonoscope [id number] ?Procedure:             Colonoscopy ?Indications:           Chronic diarrhea, Clinically significant diarrhea of  ?                       unexplained origin, Iron deficiency anemia secondary  ?                       to chronic blood loss ?Providers:             Lin Landsman MD, MD ?Medicines:             General Anesthesia ?Complications:         No immediate complications. Estimated blood loss:  ?                       Minimal. ?Procedure:             Pre-Anesthesia Assessment: ?                       - Prior to the procedure, a History and Physical was  ?                       performed, and patient medications and allergies were  ?                       reviewed. The patient is competent. The risks and  ?                       benefits of the procedure and the sedation options and  ?                       risks were discussed with the patient. All questions  ?                       were answered and informed consent was obtained.  ?                       Patient identification and proposed procedure were  ?                       verified by the physician, the nurse, the  ?                       anesthesiologist, the anesthetist and the technician  ?                       in the pre-procedure area in the procedure room in the  ?                       endoscopy suite. Mental Status Examination: alert and  ?                       oriented. Airway Examination: normal oropharyngeal  ?  airway and neck mobility. Respiratory Examination:  ?                       clear to auscultation. CV Examination: normal.  ?                       Prophylactic Antibiotics: The patient does not require   ?                       prophylactic antibiotics. Prior Anticoagulants: The  ?                       patient has taken Xarelto (rivaroxaban), last dose was  ?                       3 days prior to procedure. ASA Grade Assessment: III -  ?                       A patient with severe systemic disease. After  ?                       reviewing the risks and benefits, the patient was  ?                       deemed in satisfactory condition to undergo the  ?                       procedure. The anesthesia plan was to use general  ?                       anesthesia. Immediately prior to administration of  ?                       medications, the patient was re-assessed for adequacy  ?                       to receive sedatives. The heart rate, respiratory  ?                       rate, oxygen saturations, blood pressure, adequacy of  ?                       pulmonary ventilation, and response to care were  ?                       monitored throughout the procedure. The physical  ?                       status of the patient was re-assessed after the  ?                       procedure. ?                       After obtaining informed consent, the colonoscope was  ?                       passed under direct vision. Throughout the procedure,  ?  the patient's blood pressure, pulse, and oxygen  ?                       saturations were monitored continuously. The  ?                       Colonoscope was introduced through the anus and  ?                       advanced to the 10 cm into the ileum. The colonoscopy  ?                       was performed without difficulty. The patient  ?                       tolerated the procedure well. The quality of the bowel  ?                       preparation was good. ?Findings: ?     Hemorrhoids were found on perianal exam. ?     The terminal ileum appeared normal. ?     Normal mucosa was found in the entire colon. Biopsies for histology were  ?     taken with a  cold forceps from the entire colon for evaluation of  ?     microscopic colitis. ?     A diminutive polyp was found in the ascending colon. The polyp was  ?     sessile. The polyp was removed with a cold biopsy forceps. Resection and  ?     retrieval were complete. ?     Two sessile polyps were found in the descending colon. The polyps were 3  ?     to 4 mm in size. These polyps were removed with a cold snare. Resection  ?     and retrieval were complete. Estimated blood loss was minimal. ?     Non-bleeding external hemorrhoids were found. The hemorrhoids were large. ?     Many diverticula were found in the entire colon. ?Impression:            - Hemorrhoids found on perianal exam. ?                       - The examined portion of the ileum was normal. ?                       - Normal mucosa in the entire examined colon. Biopsied. ?                       - One diminutive polyp in the ascending colon, removed  ?                       with a cold biopsy forceps. Resected and retrieved. ?                       - Two 3 to 4 mm polyps in the descending colon,  ?                       removed with a cold snare. Resected and retrieved. ?                       -  Non-bleeding external hemorrhoids. ?                       - Diverticulosis in the entire examined colon. ?Recommendation:        - Discharge patient to home (with escort). ?                       - Cardiac diet and low sodium diet. ?                       - Continue present medications. ?                       - Await pathology results. ?                       - Resume Xarelto (rivaroxaban) in 3 days at prior  ?                       dose. Refer to managing physician for further  ?                       adjustment of therapy. ?Procedure Code(s):     --- Professional --- ?                       [phone], Colonoscopy, flexible; with removal of  ?                       tumor(s), polyp(s), or other lesion(s) by snare  ?                       technique ?                        45380, 59, Colonoscopy, flexible; with biopsy, single  ?                       or multiple ?Diagnosis Code(s):     --- Professional --- ?                       K64.4, Residual hemorrhoidal skin tags ?                       K63.5, Polyp of colon ?                       K52.9, Noninfective gastroenteritis and colitis,  ?                       unspecified ?                       R19.7, Diarrhea, unspecified ?                       D50.0, Iron deficiency anemia secondary to blood loss  ?                       (chronic) ?CPT copyright 2019 [employer] Medical Association. All rights reserved. ?The codes documented in this report are preliminary and upon coder review may  ?be revised to meet current  compliance requirements. ?Dr. Ulyess Mort ?Lovene Maret Raeanne Gathers MD, MD ?06/27/2021 10:19:13 AM ?This report has been signed electronically. ?Number of Addenda: 0 ?Note Initiated On: 06/27/2021 9:21 AM ?Scope Withdrawal Time: 0 hours 12 minutes 20 seconds  ?Total Procedure Duration: 0 hours 15 minutes 50 seconds  ?Estimated Blood Loss:  Estimated blood loss was minimal. Estimated blood  ?                       loss: none. ?     [facility] ?

## 2021-06-27 NOTE — Op Note (Signed)
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 Estimated blood loss: None. ?Procedure:             Pre-Anesthesia Assessment: ?                       - Prior to the procedure, a History and Physical was  ?                       performed, and patient medications and allergies were  ?                       reviewed. The patient is competent. The risks and  ?                       benefits of the procedure and the sedation options and  ?                       risks were discussed with the patient. All questions  ?                       were answered and informed consent was obtained.  ?                       Patient identification and proposed procedure were  ?                       verified by the physician, the nurse, the  ?                       anesthesiologist, the anesthetist and the technician  ?                       in the pre-procedure area in the procedure room in the  ?                       endoscopy suite. Mental Status Examination: alert and  ?                       oriented. Airway Examination: normal oropharyngeal  ?                       airway and neck mobility. Respiratory Examination:  ?  clear to auscultation. CV Examination: normal.  ?                       Prophylactic Antibiotics: The patient does not require  ?                       prophylactic antibiotics. Prior  Anticoagulants: The  ?                       patient has taken Xarelto (rivaroxaban), last dose was  ?                       3 days prior to procedure. ASA Grade Assessment: III -  ?                       A patient with severe systemic disease. After  ?                       reviewing the risks and benefits, the patient was  ?                       deemed in satisfactory condition to undergo the  ?                       procedure. The anesthesia plan was to use general  ?                       anesthesia. Immediately prior to administration of  ?                       medications, the patient was re-assessed for adequacy  ?                       to receive sedatives. The heart rate, respiratory  ?                       rate, oxygen saturations, blood pressure, adequacy of  ?                       pulmonary ventilation, and response to care were  ?                       monitored throughout the procedure. The physical  ?                       status of the patient was re-assessed after the  ?                       procedure. ?                       After obtaining informed consent, the endoscope was  ?                       passed under direct vision. Throughout the procedure,  ?                       the patient's blood pressure, pulse, and oxygen  ?  saturations were monitored continuously. The Endoscope  ?                       was introduced through the mouth, and advanced to the  ?                       second part of duodenum. The upper GI endoscopy was  ?                       accomplished without difficulty. The patient tolerated  ?                       the procedure well. ?Findings: ?     Five 5 mm sessile polyps with no bleeding were found in the second  ?     portion of the duodenum. The polyp was removed with a cold snare.  ?     Resection and retrieval were complete. To prevent bleeding after the  ?     polypectomy, one hemostatic clip was successfully placed (MR  ?      conditional). There was no bleeding at the end of the procedure. ?     The duodenal bulb and second portion of the duodenum were normal.  ?     Biopsies were taken with a cold forceps for histology. ?     Diffuse mildly erythematous mucosa without bleeding was found in the  ?     entire examined stomach. Biopsies were taken with a cold forceps for  ?     Helicobacter pylori testing. ?     A medium-sized hiatal hernia was present. ?     Three columns of large (> 5 mm) varices with no bleeding and no stigmata  ?     of recent bleeding were found in the middle third of the esophagus, in  ?     the lower third of the esophagus and at the gastroesophageal junction,  ?     30 cm from the incisors. Red wale signs were present. Decision was made  ?     not to perform variceal ligation given patient has to resume  ?     anticoagulation for h/o A Fib ?Impression:            - Five duodenal polyps. Resected and retrieved. Clip  ?                       (MR conditional) was placed. ?                       - Normal duodenal bulb and second portion of the  ?                       duodenum. Biopsied. ?                       - Erythematous mucosa in the stomach. Biopsied. ?                       - Medium-sized hiatal hernia. ?                       - Large (> 5 mm) esophageal varices with no bleeding  ?  and no stigmata of recent bleeding. ?Recommendation:        - Await pathology results. ?                       - Refer to an interventional radiologist at  ?                       appointment to be scheduled to evalute for TIPS after  ?                       discussing with the patient. ?                       - Return to my office in 1 month. ?                       - Recommend US liver with dopplers to evaluate portal  ?                       vein ?                       - Resume Xarelto (rivaroxaban) in 3 days at prior  ?                       dose. Refer to managing physician for further  ?                        adjustment of therapy. ?                       - Proceed with colonoscopy as scheduled ?                       See colonoscopy report ?Procedure Code(s):     --- Professional --- ?                       [id number], Esophagogastroduodenoscopy, flexible,  ?                       transoral; with removal of tumor(s), polyp(s), or  ?                       other lesion(s) by snare technique ?                       43239, 59, Esophagogastroduodenoscopy, flexible,  ?                       transoral; with biopsy, single or multiple ?Diagnosis Code(s):     --- Professional --- ?                       K31.7, Polyp of stomach and duodenum ?                       K31.89, Other diseases of stomach and duodenum ?                       I85.00, Esophageal varices without bleeding ?  K44.9, Diaphragmatic hernia without obstruction or  ?                       gangrene ?                       D50.0, Iron deficiency anemia secondary to blood loss  ?                       (chronic) ?                       D50.9, Iron deficiency anemia, unspecified ?                       R19.7, Diarrhea, unspecified ?CPT copyright 2019 American Medical Association. All rights reserved. ?The codes documented in this report are preliminary and upon coder review may  ?be revised to meet current compliance requirements. ?Dr. Ulyess Mort ?Chardonnay Holzmann Raeanne Gathers MD, MD ?06/27/2021 9:56:04 AM ?This report has been signed electronically. ?Number of Addenda: 0 ?Note Initiated On: 06/27/2021 9:24 AM ?Estimated Blood Loss:  Estimated blood loss was minimal. ?     [facility] ?

## 2021-06-27 NOTE — Transfer of Care (Signed)
Immediate Anesthesia Transfer of Care Note ? ?Patient: Erica Larsen ? ?Procedure(s) Performed: COLONOSCOPY WITH PROPOFOL ?ESOPHAGOGASTRODUODENOSCOPY (EGD) WITH PROPOFOL ? ?Patient Location: PACU and Endoscopy Unit ? ?Anesthesia Type:General ? ?Level of Consciousness: drowsy ? ?Airway & Oxygen Therapy: Patient Spontanous Breathing ? ?Post-op Assessment: Report given to RN ? ?Post vital signs: stable ? ?Last Vitals:  ?Vitals Value Taken Time  ?BP    ?Temp    ?Pulse    ?Resp    ?SpO2    ? ? ?Last Pain:  ?Vitals:  ? 06/27/21 0857  ?TempSrc: Temporal  ?PainSc: 0-No pain  ?   ? ?  ? ?Complications: No notable events documented. ?

## 2021-06-27 NOTE — Anesthesia Preprocedure Evaluation (Signed)
Anesthesia Evaluation  ?Patient identified by MRN, date of birth, ID band ?Patient awake ? ? ? ?Reviewed: ?Allergy & Precautions, H&P , NPO status , Patient's Chart, lab work & pertinent test results, reviewed documented beta blocker date and time  ? ?History of Anesthesia Complications ?Negative for: history of anesthetic complications ? ?Airway ?Mallampati: III ? ?TM Distance: >3 FB ?Neck ROM: full ? ? ? Dental ? ?(+) Caps, Missing, Poor Dentition,  ?  ?Pulmonary ?neg pulmonary ROS, neg sleep apnea, neg COPD, Patient abstained from smoking.Not current smoker,  ?  ?Pulmonary exam normal ?breath sounds clear to auscultation ? ? ? ? ? ? Cardiovascular ?Exercise Tolerance: Poor ?METShypertension, Pt. on home beta blockers and Pt. on medications ?(-) angina+ CAD, + Cardiac Stents, +CHF and + DOE  ?(-) Past MI and (-) CABG (-) dysrhythmias + Valvular Problems/Murmurs MR  ?Rhythm:Regular Rate:Normal ?+ Peripheral Edema ?TTE 2016: ?Left ventricle: The cavity size was normal. Systolic function was  ???normal. The estimated ejection fraction was 60%. Wall motion was  ???normal; there were no regional wall motion abnormalities. Doppler  ???parameters are consistent with abnormal left ventricular  ???relaxation (grade 1 diastolic dysfunction).  ?- Aortic valve: There was trivial regurgitation. Valve area (Vmax):  ???1.2 cm^2.  ?- Mitral valve: There was moderate regurgitation.  ?- Left atrium: The atrium was mildly dilated.  ?- Right atrium: The atrium was mildly dilated.  ?- Atrial septum: No defect or patent foramen ovale was identified.  ?  ?Neuro/Psych ?negative neurological ROS ? negative psych ROS  ? GI/Hepatic ?Neg liver ROS, hiatal hernia, GERD  Controlled,  ?Endo/Other  ?diabetes, Well Controlled, Type 2, Oral Hypoglycemic AgentsMorbid obesity ? Renal/GU ?CRFRenal disease  ?negative genitourinary ?  ?Musculoskeletal ? ?(+) Arthritis , Osteoarthritis,   ? Abdominal ?(+) + obese,    ?Peds ? Hematology ? ?(+) Blood dyscrasia, anemia ,   ?Anesthesia Other Findings ?Past Medical History: ?No date: Arthritis ?    Comment: osteoarthritis ?No date: CHF (congestive heart failure) ([place]) ?No date: Coronary artery disease ?No date: Diabetes mellitus without complication ([place]) ?No date: Diverticulosis ?No date: Esophagitis ?No date: GERD (gastroesophageal reflux disease) ?No date: High cholesterol ?No date: Hypercalcemia ?No date: Hypertension ?No date: Lumbar radiculopathy ?No date: Obesity ?No date: Osteopenia ?    Comment: Hips ?No date: Thyroid disease ? ? Reproductive/Obstetrics ?negative OB ROS ? ?  ? ? ? ? ? ? ? ? ? ? ? ? ? ?  ?  ? ? ? ? ? ? ? ? ?Anesthesia Physical ? ?Anesthesia Plan ? ?ASA: 3 ? ?Anesthesia Plan: General  ? ?Post-op Pain Management: Minimal or no pain anticipated  ? ?Induction: Intravenous ? ?PONV Risk Score and Plan: 3 and Ondansetron, Propofol infusion and TIVA ? ?Airway Management Planned: Natural Airway ? ?Additional Equipment: None ? ?Intra-op Plan:  ? ?Post-operative Plan:  ? ?Informed Consent: I have reviewed the patients History and Physical, chart, labs and discussed the procedure including the risks, benefits and alternatives for the proposed anesthesia with the patient or authorized representative who has indicated his/her understanding and acceptance.  ? ? ? ?Dental Advisory Given and Dental advisory given ? ?Plan Discussed with: Anesthesiologist, CRNA and Surgeon ? ?Anesthesia Plan Comments: (Discussed risks of anesthesia with patient, including possibility of difficulty with spontaneous ventilation under anesthesia necessitating airway intervention, PONV, and rare risks such as cardiac or respiratory or neurological events, and allergic reactions. Discussed the role of CRNA in patient's perioperative care. Patient understands.)  ? ? ? ? ? ? ?  Anesthesia Quick Evaluation ? ?

## 2021-06-27 NOTE — Anesthesia Postprocedure Evaluation (Signed)
Anesthesia Post Note ? ?Patient: Rosalie Buenaventura Mihalko ? ?Procedure(s) Performed: COLONOSCOPY WITH PROPOFOL ?ESOPHAGOGASTRODUODENOSCOPY (EGD) WITH PROPOFOL ? ?Patient location during evaluation: Endoscopy ?Anesthesia Type: General ?Level of consciousness: awake and alert ?Pain management: pain level controlled ?Vital Signs Assessment: post-procedure vital signs reviewed and stable ?Respiratory status: spontaneous breathing, nonlabored ventilation, respiratory function stable and patient connected to nasal cannula oxygen ?Cardiovascular status: blood pressure returned to baseline and stable ?Postop Assessment: no apparent nausea or vomiting ?Anesthetic complications: no ? ? ?No notable events documented. ? ? ?Last Vitals:  ?Vitals:  ? 06/27/21 1040 06/27/21 1050  ?BP: 124/70 (!) 108/59  ?Pulse: 98 97  ?Resp: 15 19  ?Temp:    ?SpO2: 98% 99%  ?  ?Last Pain:  ?Vitals:  ? 06/27/21 0857  ?TempSrc: Temporal  ?PainSc: 0-No pain  ? ? ?  ?  ?  ?  ?  ?  ? ?Arita Miss ? ? ? ? ?

## 2021-06-29 ENCOUNTER — Telehealth: Payer: Self-pay | Admitting: Pharmacist

## 2021-06-29 ENCOUNTER — Encounter: Payer: Self-pay | Admitting: Gastroenterology

## 2021-06-29 LAB — SURGICAL PATHOLOGY

## 2021-06-29 NOTE — Telephone Encounter (Signed)
06/29/2021 11:18:26 AM - Wilder Glade enrollment faxed to [employer] ?-- Arletha Pili - Friday, Jun 29, 2021 11:17 AM -- Wilder Glade enrollment faxed to [employer]. Patient in Coverage Gap. ?

## 2021-06-29 NOTE — Telephone Encounter (Signed)
06/29/2021 11:16:17 AM - [employer] enrollment faxed to [employer] ?-- Arletha Pili - Friday, Jun 29, 2021 11:15 AM -- Virgel Gess Evista enrollment to [facility]. Patient in Coverage Gap. ?

## 2021-06-29 NOTE — Telephone Encounter (Signed)
06/29/2021 10:21:58 AM - Alveda Reasons fax to [employer] ?-- Arletha Pili - Friday, Jun 29, 2021 10:19 AM --Faxed new pt Enrollment / Medicare Part D Coverage Gap Xarelto '20mg'$  to [employer]. ?

## 2021-07-02 ENCOUNTER — Other Ambulatory Visit: Payer: Self-pay | Admitting: Gastroenterology

## 2021-07-02 ENCOUNTER — Telehealth: Payer: Self-pay

## 2021-07-02 ENCOUNTER — Telehealth: Payer: Self-pay | Admitting: Pharmacist

## 2021-07-02 ENCOUNTER — Encounter: Payer: Self-pay | Admitting: Nurse Practitioner

## 2021-07-02 DIAGNOSIS — K529 Noninfective gastroenteritis and colitis, unspecified: Secondary | ICD-10-CM

## 2021-07-02 DIAGNOSIS — D509 Iron deficiency anemia, unspecified: Secondary | ICD-10-CM

## 2021-07-02 DIAGNOSIS — A048 Other specified bacterial intestinal infections: Secondary | ICD-10-CM | POA: Insufficient documentation

## 2021-07-02 DIAGNOSIS — R1013 Epigastric pain: Secondary | ICD-10-CM

## 2021-07-02 DIAGNOSIS — K7460 Unspecified cirrhosis of liver: Secondary | ICD-10-CM

## 2021-07-02 NOTE — Telephone Encounter (Signed)
07/02/2021 10:00:49 AM - Evista approval pt. id [id number] ?-- Erica Larsen - Monday, Jul 02, 2021 9:58 AM --Received an approval letter from [facility] for Evista. Pt approved until the end of the 2023 calendar y ?

## 2021-07-02 NOTE — Telephone Encounter (Signed)
Got patient schedule for the ultrasound doppler for 07/10/2021 at 9:30am please arrive to medical mall at 9:00am. Nothing to eat or drink after midnight  ?

## 2021-07-02 NOTE — Telephone Encounter (Signed)
Tried to call patient and a lady answer and said Erica Larsen was not available will call back later. Order the medications, scans, and referral.  ? ?Please advise on the interactions that are coming up.  ?

## 2021-07-02 NOTE — Telephone Encounter (Signed)
-----   Message from Lin Landsman, MD sent at 07/01/2021 11:56 PM EDT ----- ?Caryl Pina ? ?Please inform patient that the pathology results from upper endoscopy shows that she has Helicobacter pylori infection. ? ?Recommend triple therapy for 2 weeks ?Prevacid 30 mg twice daily if patient is on Prevacid otherwise, recommend omeprazole 20 mg twice daily ?Amoxicillin 1 g twice daily ?Clarithromycin 500 mg twice daily ? ?Recommended H. pylori breath test in 4 weeks ? ?She does have cirrhosis of liver ?Recommend ultrasound of the liver with Dopplers ?Recommend referral to interventional radiology to evaluate for TIPS given history of large esophageal varices and patient is on blood thinners ? ?I have already discussed with the patient regarding new diagnosis of cirrhosis of liver ? ?Please make a follow-up to see me in 2 weeks, okay to overbook ? ?[employer] ? ?Rohini Vanga ?

## 2021-07-02 NOTE — Telephone Encounter (Signed)
Hi Erica Larsen ? ?Patient has H. pylori infection and needs treatment with triple therapy.  She is on amiodarone 200 mg daily and I do not see any cardiology notes since 2017.  Looks like she is on low-dose amiodarone, would you be comfortable for her to take clarithromycin as part of triple therapy for 2 weeks ?Also, she is on Lipitor 80 mg daily, I see that her lipid panel has been normal.  Could you please address that as well because it has an interaction with clarithromycin ? ?Thanks ?Wynn Alldredge ?

## 2021-07-03 MED ORDER — LANSOPRAZOLE 30 MG PO CPDR: 30.0000 mg | DELAYED_RELEASE_CAPSULE | Freq: Two times a day (BID) | ORAL | 0 refills | Status: DC

## 2021-07-03 MED ORDER — CLARITHROMYCIN 500 MG PO TABS: 500.0000 mg | ORAL_TABLET | Freq: Two times a day (BID) | ORAL | 0 refills | Status: AC

## 2021-07-03 MED ORDER — AMOXICILLIN 500 MG PO TABS: 1000.0000 mg | ORAL_TABLET | Freq: Two times a day (BID) | ORAL | 0 refills | Status: AC

## 2021-07-03 NOTE — Telephone Encounter (Signed)
Patient returned call and she verbalized understanding of instructions. She understood when the ultrasound is schedule and will go pick up the medications at the pharmacy. She will schedule the TIPS when they call to schedule and she will come back for a repeat lab for the H pylori  ?

## 2021-07-03 NOTE — Addendum Note (Signed)
Addended by: [name] on: 07/03/2021 09:32 AM ? ? Modules accepted: Orders ? ?

## 2021-07-03 NOTE — Telephone Encounter (Signed)
Discussed with patient's PCP, Marnee Guarneri as well as patient's cardiologist Dr. Neoma Laming.  Patient will stop atorvastatin for 2 weeks while she is on the treatment for H. pylori. ?Discussed with patient's cardiologist, Dr. Humphrey Rolls who okayed to start her on clarithromycin while she is on amiodarone 200 mg daily ? ?We will send in prescription for triple therapy to treat H. Pylori ? ?Prevacid 30 mg twice daily before meals ?Clarithromycin 500 mg twice daily with food ?Amoxicillin 1 g twice daily with food ? ?Caryl Pina ? ?Please call the patient and give her the instructions as above ? ? ?[name], MD ?[facility] ?[address]  ?[address]  ?[place], [place] [address]  ?Main: [phone]  ?Fax: [phone] ?Pager: [phone] ? ?

## 2021-07-03 NOTE — Telephone Encounter (Signed)
Called and left a message for call back  

## 2021-07-05 ENCOUNTER — Telehealth: Payer: Self-pay | Admitting: Pharmacist

## 2021-07-05 NOTE — Telephone Encounter (Signed)
07/05/2021 10:22:08 AM - Celesta Gentile forms to dr (missed signature & date) -- Arletha Pili - Thursday, Jul 05, 2021 10:17 AM --C[employer]esta Gentile forms mailed back to provider to obtain a missed signature & date.

## 2021-07-10 ENCOUNTER — Ambulatory Visit: Admission: RE | Admit: 2021-07-10 | Payer: Medicare Other | Source: Ambulatory Visit

## 2021-07-11 ENCOUNTER — Other Ambulatory Visit: Payer: Self-pay

## 2021-07-11 ENCOUNTER — Other Ambulatory Visit: Payer: Medicare Other

## 2021-07-11 ENCOUNTER — Ambulatory Visit: Payer: Medicare Other

## 2021-07-12 ENCOUNTER — Other Ambulatory Visit: Payer: Self-pay | Admitting: Interventional Radiology

## 2021-07-12 ENCOUNTER — Ambulatory Visit
Admission: RE | Admit: 2021-07-12 | Discharge: 2021-07-12 | Disposition: A | Discharge status: Home / Self Care | Payer: Medicare Other | Source: Ambulatory Visit | Attending: Gastroenterology | Admitting: Gastroenterology

## 2021-07-12 ENCOUNTER — Other Ambulatory Visit: Payer: Self-pay | Admitting: Gastroenterology

## 2021-07-12 DIAGNOSIS — K7460 Unspecified cirrhosis of liver: Secondary | ICD-10-CM | POA: Diagnosis not present

## 2021-07-12 DIAGNOSIS — I8500 Esophageal varices without bleeding: Secondary | ICD-10-CM | POA: Diagnosis not present

## 2021-07-12 DIAGNOSIS — K7290 Hepatic failure, unspecified without coma: Secondary | ICD-10-CM

## 2021-07-12 DIAGNOSIS — K529 Noninfective gastroenteritis and colitis, unspecified: Secondary | ICD-10-CM

## 2021-07-12 DIAGNOSIS — K7689 Other specified diseases of liver: Secondary | ICD-10-CM | POA: Diagnosis not present

## 2021-07-12 DIAGNOSIS — R1013 Epigastric pain: Secondary | ICD-10-CM

## 2021-07-12 DIAGNOSIS — D509 Iron deficiency anemia, unspecified: Secondary | ICD-10-CM

## 2021-07-12 DIAGNOSIS — I81 Portal vein thrombosis: Secondary | ICD-10-CM | POA: Diagnosis not present

## 2021-07-12 DIAGNOSIS — R188 Other ascites: Secondary | ICD-10-CM | POA: Diagnosis not present

## 2021-07-12 HISTORY — PX: IR RADIOLOGIST EVAL & MGMT: IMG5224

## 2021-07-12 NOTE — H&P (Addendum)
Interventional Radiology - Clinic Visit, Initial H&P    Referring Provider (current admission): Vanga, Tally Due, MD  Reason for Visit: Cirrhosis, Esophageal Varices     History of Present Illness  Erica Larsen is a 71 y.o. female with a relevant past medical history of suspected cirrhosis, atrial fibrillation on Xarelto, CHF with diastolic dysfunction, and CKD seen today in Interventional Radiology clinic for further evaluation and management of dilated esophageal varices recently diagnosed on upper endoscopy by Dr. Marius Ditch.  She reports no history of upper or lower GI bleeding (she did have warts that were treated, but since then no bleeding). She denies any history of paracentesis. ROS positive for diarrhea.   She follows with Dr. Humphrey Rolls in [facility] as her cardiologist.    Additional Past Medical History Past Medical History:  Diagnosis Date   A-fib [facility])    Anemia of chronic disease 2017   Chronic diastolic HF (heart failure) ([place])    CKD (chronic kidney disease), stage IV (HCC)    Coronary artery disease    a.) PCI on 12/05/2006 - 80% stenosis mLAD and 70% stenosis RCA; Xience V 2.75 x 23 mm DES to mLAD    COVID-19 virus detected 08/14/2018   Diverticulosis    Esophagitis    GERD (gastroesophageal reflux disease)    Grade III diastolic dysfunction    Hiatal hernia    High cholesterol    Hypercalcemia    Hypertension    Hypothyroidism    LBBB (left bundle branch block)    Lumbar radiculopathy    Obesity    Osteoarthritis    Osteopenia    Hips   PAH (pulmonary artery hypertension) (HCC)    mild per 07/14/2018 TTE   Pneumonia    T2DM (type 2 diabetes mellitus) ([place])    Valvular insufficiency    a. MODERATE PV, TV, MV regurgitation on 07/14/2018 TTE     Surgical History  Past Surgical History:  Procedure Laterality Date   CATARACT EXTRACTION Bilateral    May - June 2018   COLONOSCOPY WITH PROPOFOL N/A 08/01/2015   Procedure: COLONOSCOPY WITH PROPOFOL;   Surgeon: Lucilla Lame, MD;  Location: [facility] ENDOSCOPY;  Service: Endoscopy;  Laterality: N/A;   COLONOSCOPY WITH PROPOFOL N/A 06/27/2021   Procedure: COLONOSCOPY WITH PROPOFOL;  Surgeon: Lin Landsman, MD;  Location: [facility] ENDOSCOPY;  Service: Gastroenterology;  Laterality: N/A;   CORONARY ANGIOPLASTY WITH STENT PLACEMENT  12/05/2006   80% stenosis mLAD and 70% stenosis RCA; Xience V 2.75 x 23 mm DES to mLAD    ESOPHAGOGASTRODUODENOSCOPY (EGD) WITH PROPOFOL N/A 05/14/2016   Procedure: ESOPHAGOGASTRODUODENOSCOPY (EGD) WITH PROPOFOL;  Surgeon: Lucilla Lame, MD;  Location: [facility] ENDOSCOPY;  Service: Endoscopy;  Laterality: N/A;   ESOPHAGOGASTRODUODENOSCOPY (EGD) WITH PROPOFOL N/A 06/27/2021   Procedure: ESOPHAGOGASTRODUODENOSCOPY (EGD) WITH PROPOFOL;  Surgeon: Lin Landsman, MD;  Location: [facility] ENDOSCOPY;  Service: Gastroenterology;  Laterality: N/A;   EYE SURGERY     GIVENS CAPSULE STUDY N/A 06/01/2015   Procedure: GIVENS CAPSULE STUDY;  Surgeon: Lucilla Lame, MD;  Location: [facility] ENDOSCOPY;  Service: Endoscopy;  Laterality: N/A;   HIATAL HERNIA REPAIR N/A 09/24/2016   Procedure: LAPAROSCOPIC REPAIR OF HIATAL HERNIA WITH FUNDOPLICATION WITH MESH (BIO-A);  Surgeon: Jules Husbands, MD;  Location: [facility] ORS;  Service: General;  Laterality: N/A;  Lithotomy position w arms tucked.    pre cancer removed     left forearm   squamous cell carcinoma surgery     back, forehead, left forearm  WART FULGURATION N/A 09/22/2020   Procedure: FULGURATION ANAL WART;  Surgeon: Robert Bellow, MD;  Location: [facility] ORS;  Service: General;  Laterality: N/A;     Medications  I have reviewed the current medication list. Refer to chart for details. Current Outpatient Medications  Medication Instructions   amiodarone (PACERONE) 200 mg, Oral, Every morning   amoxicillin (AMOXIL) 1,000 mg, Oral, 2 times daily   clarithromycin (BIAXIN) 500 mg, Oral, 2 times daily   Farxiga 10 mg, Oral, Every morning   Ferrous Sulfate  (SLOW FE PO) 1 tablet, Oral, Every morning   furosemide (LASIX) 40 mg, Oral, Every morning   lansoprazole (PREVACID) 30 mg, Oral, 2 times daily before meals   levothyroxine (SYNTHROID) 112 MCG tablet TAKE 1 TABLET(112 MCG) BY MOUTH DAILY(LABS ARE DUE)   metoprolol succinate (TOPROL-XL) 100 mg, Oral, Every morning   olmesartan (BENICAR) 40 mg, Oral, Daily   Polyethyl Glycol-Propyl Glycol (SYSTANE) 0.4-0.3 % SOLN 1 drop, Both Eyes, 2 times daily   raloxifene (EVISTA) 60 MG tablet TAKE 1 TABLET(60 MG) BY MOUTH DAILY   sitaGLIPtin (JANUVIA) 25 MG tablet TAKE 1 TABLET(25 MG) BY MOUTH DAILY   vitamin B-12 (CYANOCOBALAMIN) 1,000 mcg, Oral, Every morning   Vitamin D3 2,000 Units, Oral, Every morning   Xarelto 20 mg, Oral, Every morning      Allergies Allergies  Allergen Reactions   Sulfa Antibiotics Anaphylaxis and Swelling   Latex Rash   Does patient have contrast allergy: No     Physical Exam Current Vitals Temp: 97.8 F (36.6 C) (Temp Source: Oral)  Pulse Rate: 67     BP: 135/73  SpO2: 100 %        There is no height or weight on file to calculate BMI.  General: Alert and answers questions appropriately. No apparent distress. HEENT: Normocephalic, atraumatic. Conjunctivae normal without scleral icterus. Cardiac: Regular rate.  Pulmonary: Normal work of breathing. On room air. Abdominal: Non-tender.    Pertinent Lab Results    Latest Ref Rng & Units 06/20/2021   11:30 AM 02/21/2021   11:24 AM 08/14/2020    1:54 PM  CBC  WBC 4.0 - 10.5 K/uL 10.9   7.5   8.1    Hemoglobin 12.0 - 15.0 g/dL 12.8   9.5   12.9    Hematocrit 36.0 - 46.0 % 38.0   31.1   39.1    Platelets 150 - 400 K/uL 134   150   129        Latest Ref Rng & Units 06/20/2021   11:30 AM 05/11/2021    1:20 PM 02/21/2021   11:24 AM  CMP  Glucose 70 - 99 mg/dL 104   99   138    BUN 8 - 23 mg/dL '22   10   14    '$ Creatinine 0.44 - 1.00 mg/dL 2.03   1.47   1.58    Sodium 135 - 145 mmol/L 140   139   137     Potassium 3.5 - 5.1 mmol/L 3.6   4.0   3.6    Chloride 98 - 111 mmol/L 103   105   105    CO2 22 - 32 mmol/L '28   22   25    '$ Calcium 8.9 - 10.3 mg/dL 8.6   8.9   8.4    Total Protein 6.0 - 8.5 g/dL  6.9     Total Bilirubin 0.0 - 1.2 mg/dL  1.0  Alkaline Phos 44 - 121 IU/L  93     AST 0 - 40 IU/L  55     ALT 0 - 32 IU/L  32         Relevant and/or Recent Imaging: US abdomen 07/12/2021 - liver appears cirrhotic, small to moderate ascites, PV patent with normal direction of flow    Assessment & Plan Erica Larsen is a 71 y.o. female with a relevant past medical history of suspected cirrhosis, atrial fibrillation on Xarelto, CHF with diastolic dysfunction, and CKD seen today in Interventional Radiology clinic for further evaluation and management of dilated esophageal varices recently diagnosed on upper endoscopy by Dr. Marius Ditch.  We discussed typical indication for TIPS include refractory ascites and secondary prevention for variceal bleeding. Although she has not had a primary esophageal bleed, Dr. Marius Ditch was concerned by their size on endoscopy, and she is currently on [facility] for her atrial fibrillation.   I spoke to Dr. Marius Ditch, and will coordinate outpatient workup further, including updated labs (calculate MELD) and cross-sectional imaging (liver anatomy, variceal drainage pattern), and re-evaluation by her cardiologist [facility] status, heart failure).    Plan:  CT A/P with IV contrast  CMP, INR  Follow up with Dr. Humphrey Rolls (Cardiology) to assess continued need for [facility], and re-assess CHF/obtain Echo to evaluate candidacy for possible TIPS in the future. I'm not sure the degree of her reported diastolic dysfunction, and if she has underlying right-sided heart failure or pulmonary hypertension. She does have reported PV and TV regurgitation, which makes me cautious in considering TIPS.  Follow up with Dr. Marius Ditch in GI clinic and touch base with her after above completed      I spent a total of  60  Minutes in face-to-face in clinical consultation, greater than 50% of which was spent on medical decision-making and counseling/coordinating care for cirrhosis and esophageal varices.     Albin Felling, MD  Vascular and Interventional Radiology 07/12/2021 1:19 PM

## 2021-07-13 ENCOUNTER — Telehealth: Payer: Self-pay

## 2021-07-13 ENCOUNTER — Ambulatory Visit: Payer: Medicare Other

## 2021-07-13 DIAGNOSIS — K7460 Unspecified cirrhosis of liver: Secondary | ICD-10-CM

## 2021-07-13 NOTE — Telephone Encounter (Signed)
Patient verbalized understanding. She will go for lab work and made follow up appointment

## 2021-07-13 NOTE — Telephone Encounter (Signed)
-----   Message from Lin Landsman, MD sent at 07/12/2021 11:27 PM EDT ----- Regarding: [facility] patient Erica Larsen and Dr. Humphrey Rolls  The IR, Dr.El-Abd evaluated Ms Glazer for TIPS placement He would like to know if Ms Hilyer is stable from cardiac standpoint because she has a diagnosis of diastolic dysfunction and we want to make sure TIPS does not exacerbate her heart failure Also, she is on Xarelto for history of A-fib.  I did not perform esophageal banding because of anticoagulation and high risk for bleeding.  I would like to know if patient needs to stay on Xarelto long-term.  If that is the case, she will need TIPS by IR.  If Xarelto can be discontinued, I will do variceal ligation  Samson Frederic secondary liver disease work-up.  Please release and inform patient for lab draw and follow-up with me in 2 to 3 weeks, okay to overbook  RV

## 2021-07-17 ENCOUNTER — Other Ambulatory Visit: Payer: Self-pay | Admitting: *Deleted

## 2021-07-17 ENCOUNTER — Telehealth: Payer: Self-pay

## 2021-07-17 DIAGNOSIS — I8500 Esophageal varices without bleeding: Secondary | ICD-10-CM

## 2021-07-17 NOTE — Telephone Encounter (Signed)
Called and left a message for call back  

## 2021-07-17 NOTE — Telephone Encounter (Signed)
-----   Message from Lin Landsman, MD sent at 07/17/2021 12:00 PM EDT ----- Regarding: RE: [facility] patient I contacted Dr. Humphrey Rolls last week and he said that he will see her today at 70 AM.  Caryl Pina, when you get a chance, can you check with patient if she saw Dr. Humphrey Rolls, her cardiologist today   Thanks RV ----- Message ----- From: Venita Lick, NP Sent: 07/13/2021   8:24 AM EDT To: Lin Landsman, MD Subject: RE: [facility] patient                             I would suspect Xarelto will be long term, but will defer that decision to Dr. Humphrey Rolls from cardiac standpoint.  If you do not get response from him via Epic let me know.  At times I do get response and at times have to have my CMA call office.  I appreciate this update.  So much going on with her and her husband with dementia.:( ----- Message ----- From: Lin Landsman, MD Sent: 07/12/2021  11:33 PM EDT To: Dionisio David, MD, Juliet Rude, MD, # Subject: [facility] patient                                 Dorothyann Peng and Dr. Humphrey Rolls  The IR, Dr.El-Abd evaluated Ms Weier for TIPS placement He would like to know if Ms Vangorden is stable from cardiac standpoint because she has a diagnosis of diastolic dysfunction and we want to make sure TIPS does not exacerbate her heart failure Also, she is on Xarelto for history of A-fib.  I did not perform esophageal banding because of anticoagulation and high risk for bleeding.  I would like to know if patient needs to stay on Xarelto long-term.  If that is the case, she will need TIPS by IR.  If Xarelto can be discontinued, I will do variceal ligation  Samson Frederic secondary liver disease work-up.  Please release and inform patient for lab draw and follow-up with me in 2 to 3 weeks, okay to overbook  RV

## 2021-07-23 ENCOUNTER — Ambulatory Visit
Admission: RE | Admit: 2021-07-23 | Discharge: 2021-07-23 | Disposition: A | Discharge status: Home / Self Care | Payer: Medicare Other | Source: Ambulatory Visit | Attending: Interventional Radiology | Admitting: Interventional Radiology

## 2021-07-23 DIAGNOSIS — K7460 Unspecified cirrhosis of liver: Secondary | ICD-10-CM | POA: Insufficient documentation

## 2021-07-23 DIAGNOSIS — R188 Other ascites: Secondary | ICD-10-CM | POA: Diagnosis not present

## 2021-07-23 DIAGNOSIS — K3189 Other diseases of stomach and duodenum: Secondary | ICD-10-CM | POA: Diagnosis not present

## 2021-07-24 ENCOUNTER — Other Ambulatory Visit
Admission: RE | Admit: 2021-07-24 | Discharge: 2021-07-24 | Disposition: A | Discharge status: Home / Self Care | Payer: Medicare Other | Source: Ambulatory Visit | Attending: Gastroenterology | Admitting: Gastroenterology

## 2021-07-24 ENCOUNTER — Other Ambulatory Visit: Payer: Self-pay | Admitting: Nurse Practitioner

## 2021-07-24 ENCOUNTER — Encounter: Payer: Self-pay | Admitting: Gastroenterology

## 2021-07-24 ENCOUNTER — Ambulatory Visit: Payer: Medicare Other | Admitting: Gastroenterology

## 2021-07-24 VITALS — BP 106/59 | HR 61 | Temp 98.2°F | Ht 62.13 in | Wt 184.0 lb

## 2021-07-24 DIAGNOSIS — I8510 Secondary esophageal varices without bleeding: Secondary | ICD-10-CM

## 2021-07-24 DIAGNOSIS — R188 Other ascites: Secondary | ICD-10-CM

## 2021-07-24 DIAGNOSIS — K7460 Unspecified cirrhosis of liver: Secondary | ICD-10-CM | POA: Insufficient documentation

## 2021-07-24 DIAGNOSIS — K7290 Hepatic failure, unspecified without coma: Secondary | ICD-10-CM | POA: Insufficient documentation

## 2021-07-24 DIAGNOSIS — K7469 Other cirrhosis of liver: Secondary | ICD-10-CM

## 2021-07-24 LAB — IRON AND TIBC
Iron: 96 ug/dL (ref 28–170)
Saturation Ratios: 46 % — ABNORMAL HIGH (ref 10.4–31.8)
TIBC: 210 ug/dL — ABNORMAL LOW (ref 250–450)
UIBC: 114 ug/dL

## 2021-07-24 LAB — FERRITIN: Ferritin: 777 ng/mL — ABNORMAL HIGH (ref 11–307)

## 2021-07-24 MED ORDER — ALBUMIN HUMAN 25 % IV SOLN: INTRAVENOUS | 0 refills | Status: DC

## 2021-07-24 NOTE — Progress Notes (Signed)
[name], MD [address]  [place]  [place], [place] [address]  Main: [phone]  Fax: [phone]    Gastroenterology Consultation  Referring Provider:     Venita Lick, NP Primary Care Physician:  Venita Lick, NP Primary Gastroenterologist:  Dr. [name] Reason for Consultation:    Decompensated cirrhosis of liver        HPI:   Erica Larsen is a 71 y.o. female referred by Dr. Venita Lick, NP  for consultation & management of chronic diarrhea.  Patient reports that for more than 2 months, she has been experiencing nonbloody diarrheal episodes associated with upper abdominal pain that radiates to the lower abdomen which is every time after she eats.  She also complains of abdominal bloating.  She denies any rectal bleeding.  She denies any weight loss.  She has history of metabolic syndrome.  Her diabetes is under control.  Patient underwent stool studies, negative for infection last month.  TSH normal, has normocytic anemia, along with iron deficiency has CKD, AST has been elevated, has history of fatty liver  Patient has history of large hiatal hernia, underwent hernia repair.  Patient also reports that she had colonoscopy in 2017, found to have tubular adenomas of the colon and she is due for surveillance colonoscopy  Follow-up visit 07/24/2021 Patient is here for follow-up of new diagnosis of decompensated cirrhosis of liver.  Patient underwent upper endoscopy and colonoscopy on 06/27/2021, found to have large esophageal varices with no stigmata of recent bleeding which has prompted further work-up and imaging revealed cirrhosis of liver along with ascites.  I did not perform variceal ligation because patient is on Xarelto for history of A-fib.  Patient is here today for follow-up visit.  She did not undergo labs that were ordered for rest of the secondary liver disease work-up.  She reports worsening of abdominal distention along with swelling of  legs.  Apparently, patient stopped taking all her medications for last few days.  She states that she did not share the new diagnosis of cirrhosis with her family members.  Patient also reports that she did not follow-up with Dr. Humphrey Rolls last week on 5/30 at 9 AM.  She is not sure if she received a call and she said she does not check messages.  Patient was also evaluated by interventional radiologist Dr. Ky Barban- Abd to evaluate for TIPS.  Underwent CT scan, also recommended cardiology evaluation to rule out any right heart failure.  Patient is also not taking the H. pylori regimen as recommended  NSAIDs: None  Antiplts/Anticoagulants/Anti thrombotics: Xarelto for history of A-fib  GI Procedures:  Upper endoscopy 05/14/2016, large hiatal hernia Colonoscopy 08/01/2015, found to have tubular adenomas of the colon  EGD and colonoscopy 06/27/2021 - Five duodenal polyps. Resected and retrieved. Clip (MR conditional) was placed. - Normal duodenal bulb and second portion of the duodenum. Biopsied. - Erythematous mucosa in the stomach. Biopsied. - Medium-sized hiatal hernia. - Large (> 5 mm) esophageal varices with no bleeding and no stigmata of recent bleeding.  - Hemorrhoids found on perianal exam. - The examined portion of the ileum was normal. - Normal mucosa in the entire examined colon. Biopsied. - One diminutive polyp in the ascending colon, removed with a cold biopsy forceps. Resected and retrieved. - Two 3 to 4 mm polyps in the descending colon, removed with a cold snare. Resected and retrieved. - Non-bleeding external hemorrhoids. - Diverticulosis in the entire  examined colon.  DIAGNOSIS:  A.  DUODENUM; BIOPSIES:  - DUODENAL MUCOSA WITH MILD BRUNNER'S GLAND HYPERPLASIA.  - OVERALL NORMAL INFLAMMATORY COMPONENT AND NORMAL VILLOUS ARCHITECTURE.   B.  DUODENUM, CLINICALLY POLYP; BIOPSY:  - ADENOMATOUS DUODENAL POLYP.  - NO EVIDENCE OF HIGH-GRADE DYSPLASIA OR MALIGNANCY.   C.  STOMACH, RANDOM;  BIOPSIES:  - MILD TO MODERATE CHRONIC ACTIVE GASTRITIS WITH FOCAL GLANDULAR ATROPHY  AND INTESTINAL METAPLASIA.  - NO DYSPLASIA.  - IMMUNOHISTOCHEMICAL STAIN FOR HELICOBACTER PYLORI WILL BE PERFORMED,  THE RESULTS WILL BE ISSUED AS AN ADDENDUM REPORT WHEN COMPLETE.   D.  COLON, RANDOM; BIOPSIES:  - COLONIC MUCOSA WITH MILD NONSPECIFIC EDEMA.  - NO EVIDENCE OF A SPECIFIC COLITIS.  - NO SIGNIFICANT ATYPIA.   E.  COLON, ASCENDING, POLYP; BIOPSY:  - TUBULAR ADENOMA.  - NO EVIDENCE OF HIGH-GRADE DYSPLASIA OR MALIGNANCY.   F.  COLON, DESCENDING, CLINICALLY POLYPS; COLD SNARE BIOPSIES:  - FRAGMENTS OF BENIGN SOMEWHAT EDEMATOUS COLONIC MUCOSA.  - NO EVIDENCE OF A SPECIFIC POLYP.  - NO SIGNIFICANT ATYPIA.   Past Medical History:  Diagnosis Date   A-fib ([place])    Anemia of chronic disease 2017   Chronic diastolic HF (heart failure) (HCC)    CKD (chronic kidney disease), stage IV (HCC)    Coronary artery disease    a.) PCI on 12/05/2006 - 80% stenosis mLAD and 70% stenosis RCA; Xience V 2.75 x 23 mm DES to mLAD    COVID-19 virus detected 08/14/2018   Diverticulosis    Esophagitis    GERD (gastroesophageal reflux disease)    Grade III diastolic dysfunction    Hiatal hernia    High cholesterol    Hypercalcemia    Hypertension    Hypothyroidism    LBBB (left bundle branch block)    Lumbar radiculopathy    Obesity    Osteoarthritis    Osteopenia    Hips   PAH (pulmonary artery hypertension) (HCC)    mild per 07/14/2018 TTE   Pneumonia    T2DM (type 2 diabetes mellitus) ([place])    Valvular insufficiency    a. MODERATE PV, TV, MV regurgitation on 07/14/2018 TTE    Past Surgical History:  Procedure Laterality Date   CATARACT EXTRACTION Bilateral    May - June 2018   COLONOSCOPY WITH PROPOFOL N/A 08/01/2015   Procedure: COLONOSCOPY WITH PROPOFOL;  Surgeon: Lucilla Lame, MD;  Location: [facility] ENDOSCOPY;  Service: Endoscopy;  Laterality: N/A;   COLONOSCOPY WITH PROPOFOL N/A 06/27/2021    Procedure: COLONOSCOPY WITH PROPOFOL;  Surgeon: Lin Landsman, MD;  Location: [facility] ENDOSCOPY;  Service: Gastroenterology;  Laterality: N/A;   CORONARY ANGIOPLASTY WITH STENT PLACEMENT  12/05/2006   80% stenosis mLAD and 70% stenosis RCA; Xience V 2.75 x 23 mm DES to mLAD    ESOPHAGOGASTRODUODENOSCOPY (EGD) WITH PROPOFOL N/A 05/14/2016   Procedure: ESOPHAGOGASTRODUODENOSCOPY (EGD) WITH PROPOFOL;  Surgeon: Lucilla Lame, MD;  Location: [facility] ENDOSCOPY;  Service: Endoscopy;  Laterality: N/A;   ESOPHAGOGASTRODUODENOSCOPY (EGD) WITH PROPOFOL N/A 06/27/2021   Procedure: ESOPHAGOGASTRODUODENOSCOPY (EGD) WITH PROPOFOL;  Surgeon: Lin Landsman, MD;  Location: [facility] ENDOSCOPY;  Service: Gastroenterology;  Laterality: N/A;   EYE SURGERY     GIVENS CAPSULE STUDY N/A 06/01/2015   Procedure: GIVENS CAPSULE STUDY;  Surgeon: Lucilla Lame, MD;  Location: [facility] ENDOSCOPY;  Service: Endoscopy;  Laterality: N/A;   HIATAL HERNIA REPAIR N/A 09/24/2016   Procedure: LAPAROSCOPIC REPAIR OF HIATAL HERNIA WITH FUNDOPLICATION WITH MESH (BIO-A);  Surgeon: Jules Husbands,  MD;  Location: [facility] ORS;  Service: General;  Laterality: N/A;  Lithotomy position w arms tucked.    IR RADIOLOGIST EVAL & MGMT  07/12/2021   pre cancer removed     left forearm   squamous cell carcinoma surgery     back, forehead, left forearm   WART FULGURATION N/A 09/22/2020   Procedure: FULGURATION ANAL WART;  Surgeon: Robert Bellow, MD;  Location: [facility] ORS;  Service: General;  Laterality: N/A;    Current Outpatient Medications:    albumin human 25 % bottle, Inject 25 grams no matter what if more then 5L are removed then another 25 grams, Disp: 50 mL, Rfl: 0   amiodarone (PACERONE) 200 MG tablet, Take 200 mg by mouth in the morning., Disp: , Rfl:    atorvastatin (LIPITOR) 80 MG tablet, Take 80 mg by mouth daily., Disp: , Rfl:    Cholecalciferol (VITAMIN D3) 50 MCG (2000 UT) TABS, Take 2,000 Units by mouth in the morning., Disp: , Rfl:    FARXIGA  10 MG TABS tablet, Take 10 mg by mouth every morning., Disp: , Rfl:    Ferrous Sulfate (SLOW FE PO), Take 1 tablet by mouth in the morning., Disp: , Rfl:    furosemide (LASIX) 40 MG tablet, Take 40 mg by mouth in the morning., Disp: , Rfl:    lansoprazole (PREVACID) 30 MG capsule, Take 1 capsule (30 mg total) by mouth 2 (two) times daily before a meal for 14 days., Disp: 28 capsule, Rfl: 0   levothyroxine (SYNTHROID) 112 MCG tablet, TAKE 1 TABLET(112 MCG) BY MOUTH DAILY(LABS ARE DUE), Disp: 90 tablet, Rfl: 4   metoprolol succinate (TOPROL-XL) 100 MG 24 hr tablet, Take 100 mg by mouth in the morning., Disp: , Rfl:    olmesartan (BENICAR) 40 MG tablet, Take 40 mg by mouth daily., Disp: , Rfl:    Polyethyl Glycol-Propyl Glycol (SYSTANE) 0.4-0.3 % SOLN, Place 1 drop into both eyes in the morning and at bedtime., Disp: , Rfl:    raloxifene (EVISTA) 60 MG tablet, TAKE 1 TABLET(60 MG) BY MOUTH DAILY (Patient taking differently: Take 60 mg by mouth daily. TAKE 1 TABLET(60 MG) BY MOUTH DAILY), Disp: 90 tablet, Rfl: 4   sitaGLIPtin (JANUVIA) 25 MG tablet, TAKE 1 TABLET(25 MG) BY MOUTH DAILY (Patient taking differently: Take 25 mg by mouth daily. TAKE 1 TABLET(25 MG) BY MOUTH DAILY), Disp: 90 tablet, Rfl: 4   vitamin B-12 (CYANOCOBALAMIN) 1000 MCG tablet, Take 1,000 mcg by mouth in the morning., Disp: , Rfl:    XARELTO 20 MG TABS tablet, Take 20 mg by mouth in the morning., Disp: , Rfl:    Family History  Problem Relation Age of Onset   Heart disease Mother    Kidney failure Mother    Hypertension Mother    Diabetes Mother    Heart attack Father    Hypertension Father    Thyroid disease Father    Hypertension Sister    Diabetes Sister    Thyroid disease Daughter    Thyroid disease Daughter    Breast cancer Paternal Aunt      Social History   Tobacco Use   Smoking status: Never   Smokeless tobacco: Never  Vaping Use   Vaping Use: Never used  Substance Use Topics   Alcohol use: Yes     Comment: occassional   Drug use: No    Allergies as of 07/24/2021 - Review Complete 07/24/2021  Allergen Reaction Noted   Sulfa antibiotics  Anaphylaxis and Swelling 06/14/2014   Latex Rash 08/07/2014    Review of Systems:    All systems reviewed and negative except where noted in HPI.   Physical Exam:  BP (!) 106/59 (BP Location: Left Arm, Patient Position: Sitting, Cuff Size: Normal)   Pulse 61   Temp 98.2 F (36.8 C) (Oral)   Ht 5' 2.13" (1.578 m)   Wt 184 lb (83.5 kg)   BMI 33.51 kg/m  No LMP recorded. Patient is postmenopausal.  General:   Alert, ill-appearing, moderately nourished, pleasant and cooperative in NAD Head:  Normocephalic and atraumatic. Eyes:  Sclera clear, no icterus.   Conjunctiva pink. Ears:  Normal auditory acuity. Nose:  No deformity, discharge, or lesions. Mouth:  No deformity or lesions,oropharynx pink & moist. Neck:  Supple; no masses or thyromegaly. Lungs:  Respirations even and unlabored.  Clear throughout to auscultation.   No wheezes, crackles, or rhonchi. No acute distress. Heart:  Regular rate and rhythm; no murmurs, clicks, rubs, or gallops. Abdomen:  Normal bowel sounds. Soft, non-tender and diffusely distended, dull to percussion without masses, hepatosplenomegaly or hernias noted.  No guarding or rebound tenderness.   Rectal: Not performed Msk:  Symmetrical without gross deformities. Good, equal movement & strength bilaterally. Pulses:  Normal pulses noted. Extremities: Clubbing and 3+ edema.  No cyanosis. Neurologic:  Alert and oriented x3;  grossly normal neurologically. Skin:  Intact without significant lesions or rashes. No jaundice. Psych:  Alert and cooperative. Normal mood and affect.  Imaging Studies: Reviewed  Assessment and Plan:   Erica Larsen is a 71 y.o. female with history of obesity, A-fib on Xarelto, metabolic syndrome, CKD is seen in consultation for new diagnosis of decompensated cirrhosis of liver  Decompensated  cirrhosis of liver with ascites: Likely secondary to NASH Complete secondary liver disease work-up, labs today Massive ascites based on the CT scan from 6/5: Recommend therapeutic paracentesis with fluid analysis and administer IV albumin after therapeutic paracentesis Strongly advised patient to restart Lasix 40 mg daily.  She is also on metoprolol and Benicar Reiterated on low-sodium diet Large esophageal varices based on EGD in 06/2021, ligation was not performed as patient is on Xarelto for A-fib.  Subsequently, patient has been referred to interventional radiologist, Dr.El-Abd for evaluation of TIPS.  Waiting for cardiology input.  Patient has an appointment with Dr. Humphrey Rolls on 07/31/2021 at 1:00pm. patient underwent CT abdomen and pelvis which was unremarkable.  No evidence of liver lesions or portal vein thrombosis No evidence of HCC, check serum AFP levels No evidence of hepatic encephalopathy  Urged patient to share the diagnosis of cirrhosis with her family members and involve them in her care.  I also recommend palliative care for care coordination and goals of care discussion.  We will send my note to her PCP, Jolene Cannady  Follow up in 3 months or sooner as needed   [name], MD

## 2021-07-24 NOTE — Patient Instructions (Addendum)
Your Paracentesis is schedule for 07/26/2021 [facility] at 2:00[facility] for a 2:30pm procedure.

## 2021-07-25 ENCOUNTER — Telehealth: Payer: Self-pay

## 2021-07-25 ENCOUNTER — Telehealth: Payer: Self-pay | Admitting: Student

## 2021-07-25 LAB — ANA: Anti Nuclear Antibody (ANA): NEGATIVE

## 2021-07-25 LAB — CERULOPLASMIN: Ceruloplasmin: 24.6 mg/dL (ref 19.0–39.0)

## 2021-07-25 NOTE — Telephone Encounter (Signed)
-----   Message from Lin Landsman, MD sent at 07/24/2021  5:18 PM EDT ----- Patient should stop taking oral iron supplements because her iron levels are very high  RV

## 2021-07-25 NOTE — Telephone Encounter (Signed)
Attempted to contact patient to schedule Palliative Consult, no answer - left message with reason for call along with my name and call back number, requesting a return call.

## 2021-07-25 NOTE — Telephone Encounter (Signed)
Patient has appointment Dr. Humphrey Rolls on 07/31/2021 at 1:00pm.  Called and left a message for call back to go over results

## 2021-07-25 NOTE — Telephone Encounter (Signed)
Patient verbalized understanding of instructions and results

## 2021-07-26 ENCOUNTER — Ambulatory Visit
Admission: RE | Admit: 2021-07-26 | Discharge: 2021-07-26 | Disposition: A | Discharge status: Home / Self Care | Payer: Medicare Other | Source: Ambulatory Visit | Attending: Gastroenterology | Admitting: Gastroenterology

## 2021-07-26 ENCOUNTER — Other Ambulatory Visit
Admission: RE | Admit: 2021-07-26 | Discharge: 2021-07-26 | Disposition: A | Discharge status: Home / Self Care | Payer: Medicare Other | Source: Ambulatory Visit | Attending: Gastroenterology | Admitting: Gastroenterology

## 2021-07-26 DIAGNOSIS — K7290 Hepatic failure, unspecified without coma: Secondary | ICD-10-CM | POA: Diagnosis not present

## 2021-07-26 DIAGNOSIS — R188 Other ascites: Secondary | ICD-10-CM | POA: Diagnosis not present

## 2021-07-26 DIAGNOSIS — K7460 Unspecified cirrhosis of liver: Secondary | ICD-10-CM | POA: Diagnosis not present

## 2021-07-26 LAB — PROTEIN, PLEURAL OR PERITONEAL FLUID: Total protein, fluid: <3 g/dL

## 2021-07-26 LAB — BODY FLUID CELL COUNT WITH DIFFERENTIAL
Eos, Fluid: 0 %
Lymphs, Fluid: 64 %
Monocyte-Macrophage-Serous Fluid: 29 %
Neutrophil Count, Fluid: 7 %
Total Nucleated Cell Count, Fluid: 361 cu mm

## 2021-07-26 LAB — ALPHA-1-ANTITRYPSIN PHENOTYP: A-1 Antitrypsin, Ser: 248 mg/dL — ABNORMAL HIGH (ref 101–187)

## 2021-07-26 LAB — ALBUMIN, PLEURAL OR PERITONEAL FLUID: Albumin, Fluid: <1.5 g/dL

## 2021-07-26 MED ORDER — ALBUMIN HUMAN 25 % IV SOLN
INTRAVENOUS | Status: AC
  Filled 2021-07-26: qty 100

## 2021-07-26 NOTE — Procedures (Signed)
PROCEDURE SUMMARY:  Successful ultrasound guided paracentesis from the RUQ Yielded 5 L of clear yellow fluid.  No immediate complications.  The patient tolerated the procedure well.   Specimen was sent for labs.  EBL <  77m  The patient has previously been [facility] and is being actively followed for potential future intervention.  MHedy Jacob PA-C 07/26/2021, 4:46 PM

## 2021-07-27 LAB — TISSUE TRANSGLUTAMINASE, IGA: Tissue Transglutaminase Ab, IgA: <2 U/mL (ref 0–3)

## 2021-07-27 LAB — MITOCHONDRIAL ANTIBODIES: Mitochondrial M2 Ab, IgG: <20 Units (ref 0.0–20.0)

## 2021-07-27 LAB — ANTI-SMOOTH MUSCLE ANTIBODY, IGG: F-Actin IgG: 20 Units — ABNORMAL HIGH (ref 0–19)

## 2021-07-29 NOTE — Patient Instructions (Addendum)
Stop Januvia.   Diabetes Mellitus and Nutrition, Adult When you have diabetes, or diabetes mellitus, it is very important to have healthy eating habits because your blood sugar (glucose) levels are greatly affected by what you eat and drink. Eating healthy foods in the right amounts, at about the same times every day, can help you: Manage your blood glucose. Lower your risk of heart disease. Improve your blood pressure. Reach or maintain a healthy weight. What can affect my meal plan? Every person with diabetes is different, and each person has different needs for a meal plan. Your health care provider may recommend that you work with a dietitian to make a meal plan that is best for you. Your meal plan may vary depending on factors such as: The calories you need. The medicines you take. Your weight. Your blood glucose, blood pressure, and cholesterol levels. Your activity level. Other health conditions you have, such as heart or kidney disease. How do carbohydrates affect me? Carbohydrates, also called carbs, affect your blood glucose level more than any other type of food. Eating carbs raises the amount of glucose in your blood. It is important to know how many carbs you can safely have in each meal. This is different for every person. Your dietitian can help you calculate how many carbs you should have at each meal and for each snack. How does alcohol affect me? Alcohol can cause a decrease in blood glucose (hypoglycemia), especially if you use insulin or take certain diabetes medicines by mouth. Hypoglycemia can be a life-threatening condition. Symptoms of hypoglycemia, such as sleepiness, dizziness, and confusion, are similar to symptoms of having too much alcohol. Do not drink alcohol if: Your health care provider tells you not to drink. You are pregnant, may be pregnant, or are planning to become pregnant. If you drink alcohol: Limit how much you have to: 0-1 drink a day for  women. 0-2 drinks a day for men. Know how much alcohol is in your drink. In the [place] one drink equals one 12 oz bottle of beer (355 mL), one 5 oz glass of wine (148 mL), or one 1 oz glass of hard liquor (44 mL). Keep yourself hydrated with water, diet soda, or unsweetened iced tea. Keep in mind that regular soda, juice, and other mixers may contain a lot of sugar and must be counted as carbs. What are tips for following this plan?  Reading food labels Start by checking the serving size on the Nutrition Facts label of packaged foods and drinks. The number of calories and the amount of carbs, fats, and other nutrients listed on the label are based on one serving of the item. Many items contain more than one serving per package. Check the total grams (g) of carbs in one serving. Check the number of grams of saturated fats and trans fats in one serving. Choose foods that have a low amount or none of these fats. Check the number of milligrams (mg) of salt (sodium) in one serving. Most people should limit total sodium intake to less than 2,300 mg per day. Always check the nutrition information of foods labeled as "low-fat" or "nonfat." These foods may be higher in added sugar or refined carbs and should be avoided. Talk to your dietitian to identify your daily goals for nutrients listed on the label. Shopping Avoid buying canned, pre-made, or processed foods. These foods tend to be high in fat, sodium, and added sugar. Shop around the outside edge of the grocery store.  This is where you will most often find fresh fruits and vegetables, bulk grains, fresh meats, and fresh dairy products. Cooking Use low-heat cooking methods, such as baking, instead of high-heat cooking methods, such as deep frying. Cook using healthy oils, such as olive, canola, or sunflower oil. Avoid cooking with butter, cream, or high-fat meats. Meal planning Eat meals and snacks regularly, preferably at the same times every day.  Avoid going long periods of time without eating. Eat foods that are high in fiber, such as fresh fruits, vegetables, beans, and whole grains. Eat 4-6 oz (112-168 g) of lean protein each day, such as lean meat, chicken, fish, eggs, or tofu. One ounce (oz) (28 g) of lean protein is equal to: 1 oz (28 g) of meat, chicken, or fish. 1 egg.  cup (62 g) of tofu. Eat some foods each day that contain healthy fats, such as avocado, nuts, seeds, and fish. What foods should I eat? Fruits Berries. Apples. Oranges. Peaches. Apricots. Plums. Grapes. Mangoes. Papayas. Pomegranates. Kiwi. Cherries. Vegetables Leafy greens, including lettuce, spinach, kale, chard, collard greens, mustard greens, and cabbage. Beets. Cauliflower. Broccoli. Carrots. Green beans. Tomatoes. Peppers. Onions. Cucumbers. Brussels sprouts. Grains Whole grains, such as whole-wheat or whole-grain bread, crackers, tortillas, cereal, and pasta. Unsweetened oatmeal. Quinoa. Brown or wild rice. Meats and other proteins Seafood. Poultry without skin. Lean cuts of poultry and beef. Tofu. Nuts. Seeds. Dairy Low-fat or fat-free dairy products such as milk, yogurt, and cheese. The items listed above may not be a complete list of foods and beverages you can eat and drink. Contact a dietitian for more information. What foods should I avoid? Fruits Fruits canned with syrup. Vegetables Canned vegetables. Frozen vegetables with butter or cream sauce. Grains Refined white flour and flour products such as bread, pasta, snack foods, and cereals. Avoid all processed foods. Meats and other proteins Fatty cuts of meat. Poultry with skin. Breaded or fried meats. Processed meat. Avoid saturated fats. Dairy Full-fat yogurt, cheese, or milk. Beverages Sweetened drinks, such as soda or iced tea. The items listed above may not be a complete list of foods and beverages you should avoid. Contact a dietitian for more information. Questions to ask a health  care provider Do I need to meet with a certified diabetes care and education specialist? Do I need to meet with a dietitian? What number can I call if I have questions? When are the best times to check my blood glucose? Where to find more information: American Diabetes Association: diabetes.org Academy of Nutrition and Dietetics: eatright.[employer] of Diabetes and Digestive and Kidney Diseases: [employer] Association of Diabetes Care & Education Specialists: diabeteseducator.org Summary It is important to have healthy eating habits because your blood sugar (glucose) levels are greatly affected by what you eat and drink. It is important to use alcohol carefully. A healthy meal plan will help you manage your blood glucose and lower your risk of heart disease. Your health care provider may recommend that you work with a dietitian to make a meal plan that is best for you. This information is not intended to replace advice given to you by your health care provider. Make sure you discuss any questions you have with your health care provider. Document Revised: 09/08/2019 Document Reviewed: 09/08/2019 Elsevier Patient Education  [place].

## 2021-07-30 ENCOUNTER — Ambulatory Visit (INDEPENDENT_AMBULATORY_CARE_PROVIDER_SITE_OTHER): Payer: Medicare Other | Admitting: *Deleted

## 2021-07-30 DIAGNOSIS — Z0000 Encounter for general adult medical examination without abnormal findings: Secondary | ICD-10-CM | POA: Diagnosis not present

## 2021-07-30 LAB — BODY FLUID CULTURE W GRAM STAIN
Culture: NO GROWTH
Gram Stain: NONE SEEN

## 2021-07-30 LAB — PROTEIN, BODY FLUID (OTHER): Total Protein, Body Fluid Other: 1.3 g/dL

## 2021-07-30 LAB — CYTOLOGY - NON PAP

## 2021-07-30 NOTE — Progress Notes (Signed)
Subjective:   Erica Larsen is a 71 y.o. female who presents for Medicare Annual (Subsequent) preventive examination.  I connected with  Erica Larsen on 07/30/21 by a telephone enabled telemedicine application and verified that I am speaking with the correct person using two identifiers.   I discussed the limitations of evaluation and management by telemedicine. The patient expressed understanding and agreed to proceed.  Patient location: home  Provider location: Tele-Health-home    Review of Systems     Cardiac Risk Factors include: obesity (BMI >30kg/m2);family history of premature cardiovascular disease;diabetes mellitus;sedentary lifestyle;advanced age (>35mn, >>23women);hypertension     Objective:    There were no vitals filed for this visit. There is no height or weight on file to calculate BMI.     07/30/2021   10:08 AM 06/27/2021    8:55 AM 06/22/2021    1:00 PM 06/22/2021   12:58 PM 02/23/2021    1:06 PM 09/22/2020   11:42 AM 09/15/2020    9:26 AM  Advanced Directives  Does Patient Have a Medical Advance Directive? No No No No No No No  Would patient like information on creating a medical advance directive? No - Patient declined No - Patient declined No - Patient declined No - Patient declined No - Patient declined No - Patient declined No - Patient declined    Current Medications (verified) Outpatient Encounter Medications as of 07/30/2021  Medication Sig   albumin human 25 % bottle Inject 25 grams no matter what if more then 5L are removed then another 25 grams   amiodarone (PACERONE) 200 MG tablet Take 200 mg by mouth in the morning.   atorvastatin (LIPITOR) 80 MG tablet Take 80 mg by mouth daily.   Cholecalciferol (VITAMIN D3) 50 MCG (2000 UT) TABS Take 2,000 Units by mouth in the morning.   FARXIGA 10 MG TABS tablet Take 10 mg by mouth every morning.   Ferrous Sulfate (SLOW FE PO) Take 1 tablet by mouth in the morning.   furosemide (LASIX) 40 MG tablet Take 40  mg by mouth in the morning.   levothyroxine (SYNTHROID) 112 MCG tablet TAKE 1 TABLET(112 MCG) BY MOUTH DAILY(LABS ARE DUE)   metoprolol succinate (TOPROL-XL) 100 MG 24 hr tablet Take 100 mg by mouth in the morning.   olmesartan (BENICAR) 40 MG tablet Take 40 mg by mouth daily.   Polyethyl Glycol-Propyl Glycol (SYSTANE) 0.4-0.3 % SOLN Place 1 drop into both eyes in the morning and at bedtime.   raloxifene (EVISTA) 60 MG tablet TAKE 1 TABLET(60 MG) BY MOUTH DAILY (Patient taking differently: Take 60 mg by mouth daily. TAKE 1 TABLET(60 MG) BY MOUTH DAILY)   sitaGLIPtin (JANUVIA) 25 MG tablet TAKE 1 TABLET(25 MG) BY MOUTH DAILY (Patient taking differently: Take 25 mg by mouth daily. TAKE 1 TABLET(25 MG) BY MOUTH DAILY)   vitamin B-12 (CYANOCOBALAMIN) 1000 MCG tablet Take 1,000 mcg by mouth in the morning.   XARELTO 20 MG TABS tablet Take 20 mg by mouth in the morning.   lansoprazole (PREVACID) 30 MG capsule Take 1 capsule (30 mg total) by mouth 2 (two) times daily before a meal for 14 days.   No facility-administered encounter medications on file as of 07/30/2021.    Allergies (verified) Sulfa antibiotics and Latex   History: Past Medical History:  Diagnosis Date   A-fib (HShow Low    Anemia of chronic disease 2017   Chronic diastolic HF (heart failure) (HCC)    CKD (chronic kidney  disease), stage IV ([place])    Coronary artery disease    a.) PCI on 12/05/2006 - 80% stenosis mLAD and 70% stenosis RCA; Xience V 2.75 x 23 mm DES to mLAD    COVID-19 virus detected 08/14/2018   Diverticulosis    Esophagitis    GERD (gastroesophageal reflux disease)    Grade III diastolic dysfunction    Hiatal hernia    High cholesterol    Hypercalcemia    Hypertension    Hypothyroidism    LBBB (left bundle branch block)    Lumbar radiculopathy    Obesity    Osteoarthritis    Osteopenia    Hips   PAH (pulmonary artery hypertension) (HCC)    mild per 07/14/2018 TTE   Pneumonia    T2DM (type 2 diabetes  mellitus) ([place])    Valvular insufficiency    a. MODERATE PV, TV, MV regurgitation on 07/14/2018 TTE   Past Surgical History:  Procedure Laterality Date   CATARACT EXTRACTION Bilateral    May - June 2018   COLONOSCOPY WITH PROPOFOL N/A 08/01/2015   Procedure: COLONOSCOPY WITH PROPOFOL;  Surgeon: Lucilla Lame, MD;  Location[facility] ENDOSCOPY;  Service: Endoscopy;  Laterality: N/A;   COLONOSCOPY WITH PROPOFOL N/A 06/27/2021   Procedure: COLONOSCOPY WITH PROPOFOL;  Surgeon: Lin Landsman, MD;  Location: [facility] ENDOSCOPY;  Service: Gastroenterology;  Laterality: N/A;   CORONARY ANGIOPLASTY WITH STENT PLACEMENT  12/05/2006   80% stenosis mLAD and 70% stenosis RCA; Xience V 2.75 x 23 mm DES to mLAD    ESOPHAGOGASTRODUODENOSCOPY (EGD) WITH PROPOFOL N/A 05/14/2016   Procedure: ESOPHAGOGASTRODUODENOSCOPY (EGD) WITH PROPOFOL;  Surgeon: Lucilla Lame, MD;  Location: [facility] ENDOSCOPY;  Service: Endoscopy;  Laterality: N/A;   ESOPHAGOGASTRODUODENOSCOPY (EGD) WITH PROPOFOL N/A 06/27/2021   Procedure: ESOPHAGOGASTRODUODENOSCOPY (EGD) WITH PROPOFOL;  Surgeon: Lin Landsman, MD;  Location: [facility] ENDOSCOPY;  Service: Gastroenterology;  Laterality: N/A;   EYE SURGERY     GIVENS CAPSULE STUDY N/A 06/01/2015   Procedure: GIVENS CAPSULE STUDY;  Surgeon: Lucilla Lame, MD;  Location: [facility] ENDOSCOPY;  Service: Endoscopy;  Laterality: N/A;   HIATAL HERNIA REPAIR N/A 09/24/2016   Procedure: LAPAROSCOPIC REPAIR OF HIATAL HERNIA WITH FUNDOPLICATION WITH MESH (BIO-A);  Surgeon: Jules Husbands, MD;  Location: [facility] ORS;  Service: General;  Laterality: N/A;  Lithotomy position w arms tucked.    IR RADIOLOGIST EVAL & MGMT  07/12/2021   pre cancer removed     left forearm   squamous cell carcinoma surgery     back, forehead, left forearm   WART FULGURATION N/A 09/22/2020   Procedure: FULGURATION ANAL WART;  Surgeon: Robert Bellow, MD;  Location: [facility] ORS;  Service: General;  Laterality: N/A;   Family History  Problem  Relation Age of Onset   Heart disease Mother    Kidney failure Mother    Hypertension Mother    Diabetes Mother    Heart attack Father    Hypertension Father    Thyroid disease Father    Hypertension Sister    Diabetes Sister    Thyroid disease Daughter    Thyroid disease Daughter    Breast cancer Paternal Aunt    Social History   Socioeconomic History   Marital status: Married    Spouse name: [name]   Number of children: 2   Years of education: Not on file   Highest education level: Not on file  Occupational History   Occupation: retired  Tobacco Use   Smoking status: Never  Smokeless tobacco: Never  Vaping Use   Vaping Use: Never used  Substance and Sexual Activity   Alcohol use: Yes    Comment: occassional   Drug use: No   Sexual activity: Yes    Birth control/protection: Post-menopausal  Other Topics Concern   Not on file  Social History Narrative   Not on file   Social Determinants of Health   Financial Resource Strain: Low Risk  (07/30/2021)   Overall Financial Resource Strain (CARDIA)    Difficulty of Paying Living Expenses: Not hard at all  Food Insecurity: No Food Insecurity (07/30/2021)   Hunger Vital Sign    Worried About Running Out of Food in the Last Year: Never true    Ran Out of Food in the Last Year: Never true  Transportation Needs: No Transportation Needs (07/30/2021)   PRAPARE - Hydrologist (Medical): No    Lack of Transportation (Non-Medical): No  Physical Activity: Inactive (07/30/2021)   Exercise Vital Sign    Days of Exercise per Week: 0 days    Minutes of Exercise per Session: 0 min  Stress: No Stress Concern Present (07/30/2021)   [place]    Feeling of Stress : Not at all  Social Connections: Moderately Isolated (07/30/2021)   Social Connection and Isolation Panel [NHANES]    Frequency of Communication with Friends and Family: Never     Frequency of Social Gatherings with Friends and Family: Three times a week    Attends Religious Services: Never    Active Member of Clubs or Organizations: No    Attends Music therapist: Never    Marital Status: Married    Tobacco Counseling Counseling given: Not Answered   Clinical Intake:  Pre-visit preparation completed: Yes  Pain : No/denies pain     Nutritional Risks: None Diabetes: Yes CBG done?: No Did pt. bring in CBG monitor from home?: No  How often do you need to have someone help you when you read instructions, pamphlets, or other written materials from your doctor or pharmacy?: 1 - Never  Diabetic?   Yes  Nutrition Risk Assessment:  Has the patient had any N/V/D within the last 2 months?  No  Does the patient have any non-healing wounds?  No  Has the patient had any unintentional weight loss or weight gain?  No   Diabetes:  Is the patient diabetic?  Yes  If diabetic, was a CBG obtained today?  No  Did the patient bring in their glucometer from home?  No  How often do you monitor your CBG's? Patient does not check on a regular basis.   Financial Strains and Diabetes Management:  Are you having any financial strains with the device, your supplies or your medication? Yes .  Does the patient want to be seen by Chronic Care Management for management of their diabetes?  No  Would the patient like to be referred to a Nutritionist or for Diabetic Management?  No   Diabetic Exams:  Diabetic Eye Exam: . Pt has been advised about the importance in completing this exam. A referral has been placed today.   Diabetic Foot Exam: Pt has been advised about the importance in completing this exam.  Interpreter Needed?: No  Information entered by :: Leroy Kennedy LPN   Activities of Daily Living    07/30/2021   10:20 AM 09/15/2020    9:25 AM  In your present state  of health, do you have any difficulty performing the following activities:  Hearing? 0 0   Vision? 0 0  Difficulty concentrating or making decisions? 0 0  Walking or climbing stairs? 0 0  Dressing or bathing? 0 0  Doing errands, shopping? 0 0  Preparing Food and eating ? N   Using the Toilet? N   In the past six months, have you accidently leaked urine? N   Do you have problems with loss of bowel control? N   Managing your Medications? N   Managing your Finances? N   Housekeeping or managing your Housekeeping? N     Patient Care Team: Venita Lick, NP as PCP - General (Nurse Practitioner) Dionisio David, MD as Consulting Physician (Cardiology) Cammie Sickle, MD as Consulting Physician (Internal Medicine)  Indicate any recent Medical Services you may have received from other than [facility] providers in the past year (date may be approximate).     Assessment:   This is a routine wellness examination for Makailyn.  Hearing/Vision screen Hearing Screening - Comments:: No trouble hearing Vision Screening - Comments:: Not up to date   Dietary issues and exercise activities discussed: Current Exercise Habits: The patient does not participate in regular exercise at present, Exercise limited by: None identified   Goals Addressed             This Visit's Progress    Patient Stated       No goals       Depression Screen    07/30/2021   10:10 AM 07/10/2020   11:21 AM 04/06/2020   10:59 AM 06/30/2019   11:21 AM 03/22/2019   10:29 AM 10/02/2017   10:19 AM 05/31/2016    9:34 AM  PHQ 2/9 Scores  PHQ - 2 Score 4 0 0 0 0 0 0  PHQ- 9 Score   0   0 3    Fall Risk    07/30/2021   10:05 AM 07/10/2020   11:21 AM 04/06/2020   10:59 AM 06/30/2019   11:21 AM 03/22/2019   10:29 AM  Fall Risk   Falls in the past year? 1 0 0 0 0  Number falls in past yr: 0   0 0  Injury with Fall? 0   0 0  Risk for fall due to :  Medication side effect     Follow up Falls evaluation completed;Education provided;Falls prevention discussed Falls evaluation completed;Education  provided;Falls prevention discussed   Falls evaluation completed    FALL RISK PREVENTION PERTAINING TO THE HOME:  Any stairs in or around the home? No  If so, are there any without handrails? No  Home free of loose throw rugs in walkways, pet beds, electrical cords, etc? Yes  Adequate lighting in your home to reduce risk of falls? Yes   ASSISTIVE DEVICES UTILIZED TO PREVENT FALLS:  Life alert? No  Use of a cane, walker or w/c? No  Grab bars in the bathroom? No  Shower chair or bench in shower? No  Elevated toilet seat or a handicapped toilet? No   TIMED UP AND GO:  Was the test performed? No .    Cognitive Function:        07/30/2021   10:06 AM 07/10/2020   11:24 AM  6CIT Screen  What Year? 0 points 0 points  What month? 0 points 0 points  What time? 0 points 0 points  Count back from 20 0 points 0 points  Months  in reverse 0 points 0 points  Repeat phrase 0 points 0 points  Total Score 0 points 0 points    Immunizations Immunization History  Administered Date(s) Administered   Fluad Quad(high Dose 65+) 01/05/2020   Influenza, High Dose Seasonal PF 12/19/2015, 12/10/2016, 02/08/2018   Influenza,inj,Quad PF,6+ Mos 11/25/2014   Influenza-Unspecified 11/29/2013, 12/10/2016   PFIZER(Purple Top)SARS-COV-2 Vaccination 05/11/2019, 06/08/2019, 04/18/2020   Pneumococcal Conjugate-13 03/02/2014   Pneumococcal Polysaccharide-23 01/18/2009, 03/08/2015   Tdap 09/20/2011   Zoster, Live 11/29/2013    TDAP status: Up to date  Flu Vaccine status: Due, Education has been provided regarding the importance of this vaccine. Advised may receive this vaccine at local pharmacy or Health Dept. Aware to provide a copy of the vaccination record if obtained from local pharmacy or Health Dept. Verbalized acceptance and understanding.  Pneumococcal vaccine status: Up to date  Covid-19 vaccine status: Information provided on how to obtain vaccines.   Qualifies for Shingles Vaccine? Yes    Zostavax completed Yes   Shingrix Completed?: No.    Education has been provided regarding the importance of this vaccine. Patient has been advised to call insurance company to determine out of pocket expense if they have not yet received this vaccine. Advised may also receive vaccine at local pharmacy or Health Dept. Verbalized acceptance and understanding.  Screening Tests Health Maintenance  Topic Date Due   OPHTHALMOLOGY EXAM  04/15/2017   Zoster Vaccines- Shingrix (1 of 2) 08/11/2021 (Originally 06/17/1969)   COVID-19 Vaccine (4 - Booster for Pfizer series) 09-04-2021 (Originally 06/13/2020)   INFLUENZA VACCINE  09/18/2021   TETANUS/TDAP  09/19/2021   HEMOGLOBIN A1C  11/11/2021   FOOT EXAM  05/12/2022   MAMMOGRAM  05/16/2022   COLONOSCOPY (Pts 45-12yr Insurance coverage will need to be confirmed)  06/28/2026   Pneumonia Vaccine 71 Years old  Completed   DEXA SCAN  Completed   Hepatitis C Screening  Completed   HPV VACCINES  Aged Out    Health Maintenance  Health Maintenance Due  Topic Date Due   OPHTHALMOLOGY EXAM  04/15/2017    Colorectal cancer screening: Type of screening: Colonoscopy. Completed 2023. Repeat every 0 years  Mammogram status: Completed  . Repeat every year  Bone Density status: Completed 2022. Results reflect: Bone density results: OSTEOPENIA. Repeat every 2 years.  Lung Cancer Screening: (Low Dose CT Chest recommended if Age 73-80years, 30 pack-year currently smoking OR have quit w/in 15years.) does not qualify.   Lung Cancer Screening Referral:   Additional Screening:  Hepatitis C Screening: does not qualify; Completed 2017  Vision Screening: Recommended annual ophthalmology exams for early detection of glaucoma and other disorders of the eye. Is the patient up to date with their annual eye exam?  No  Who is the provider or what is the name of the office in which the patient attends annual eye exams?  If pt is not established with a provider,  would they like to be referred to a provider to establish care? No .   Dental Screening: Recommended annual dental exams for proper oral hygiene  Community Resource Referral / Chronic Care Management: CRR required this visit?  No   CCM required this visit?  No      Plan:     I have personally reviewed and noted the following in the patient's chart:   Medical and social history Use of alcohol, tobacco or illicit drugs  Current medications and supplements including opioid prescriptions.  Functional ability and status Nutritional  status Physical activity Advanced directives List of other physicians Hospitalizations, surgeries, and ER visits in previous 12 months Vitals Screenings to include cognitive, depression, and falls Referrals and appointments  In addition, I have reviewed and discussed with patient certain preventive protocols, quality metrics, and best practice recommendations. A written personalized care plan for preventive services as well as general preventive health recommendations were provided to patient.     Leroy Kennedy, LPN   [id number]   Nurse Notes: Patient is out of strips so hasn't been checking blood sugar states she is going to talk to MD about prescription at her appointment 6-16

## 2021-07-30 NOTE — Patient Instructions (Signed)
[name] , Thank you for taking time to come for your Medicare Wellness Visit. I appreciate your ongoing commitment to your health goals. Please review the following plan we discussed and let me know if I can assist you in the future.   Screening recommendations/referrals: Colonoscopy: up to date Mammogram: up to date Bone Density: up to date Recommended yearly ophthalmology/optometry visit for glaucoma screening and checkup Recommended yearly dental visit for hygiene and checkup  Vaccinations: Influenza vaccine: Education provided Pneumococcal vaccine: up to date Tdap vaccine:  up to date Shingles vaccine: Education provided    Advanced directives: up to date  Conditions/risks identified:   Next appointment: 08-03-2021 @ 1:40  [place] 73 Years and Older, Female Preventive care refers to lifestyle choices and visits with your health care provider that can promote health and wellness. What does preventive care include? A yearly physical exam. This is also called an annual well check. Dental exams once or twice a year. Routine eye exams. Ask your health care provider how often you should have your eyes checked. Personal lifestyle choices, including: Daily care of your teeth and gums. Regular physical activity. Eating a healthy diet. Avoiding tobacco and drug use. Limiting alcohol use. Practicing safe sex. Taking low-dose aspirin every day. Taking vitamin and mineral supplements as recommended by your health care provider. What happens during an annual well check? The services and screenings done by your health care provider during your annual well check will depend on your age, overall health, lifestyle risk factors, and family history of disease. Counseling  Your health care provider may ask you questions about your: Alcohol use. Tobacco use. Drug use. Emotional well-being. Home and relationship well-being. Sexual activity. Eating habits. History of  falls. Memory and ability to understand (cognition). Work and work Statistician. Reproductive health. Screening  You may have the following tests or measurements: Height, weight, and BMI. Blood pressure. Lipid and cholesterol levels. These may be checked every 5 years, or more frequently if you are over 24 years old. Skin check. Lung cancer screening. You may have this screening every year starting at age 27 if you have a 30-pack-year history of smoking and currently smoke or have quit within the past 15 years. Fecal occult blood test (FOBT) of the stool. You may have this test every year starting at age 80. Flexible sigmoidoscopy or colonoscopy. You may have a sigmoidoscopy every 5 years or a colonoscopy every 10 years starting at age 40. Hepatitis C blood test. Hepatitis B blood test. Sexually transmitted disease (STD) testing. Diabetes screening. This is done by checking your blood sugar (glucose) after you have not eaten for a while (fasting). You may have this done every 1-3 years. Bone density scan. This is done to screen for osteoporosis. You may have this done starting at age 7. Mammogram. This may be done every 1-2 years. Talk to your health care provider about how often you should have regular mammograms. Talk with your health care provider about your test results, treatment options, and if necessary, the need for more tests. Vaccines  Your health care provider may recommend certain vaccines, such as: Influenza vaccine. This is recommended every year. Tetanus, diphtheria, and acellular pertussis (Tdap, Td) vaccine. You may need a Td booster every 10 years. Zoster vaccine. You may need this after age 53. Pneumococcal 13-valent conjugate (PCV13) vaccine. One dose is recommended after age 55. Pneumococcal polysaccharide (PPSV23) vaccine. One dose is recommended after age 45. Talk to your health  care provider about which screenings and vaccines you need and how often you need  them. This information is not intended to replace advice given to you by your health care provider. Make sure you discuss any questions you have with your health care provider. Document Released: 03/03/2015 Document Revised: 10/25/2015 Document Reviewed: 12/06/2014 Elsevier Interactive Patient Education  2017 [employer] Prevention in the Home Falls can cause injuries. They can happen to people of all ages. There are many things you can do to make your home safe and to help prevent falls. What can I do on the outside of my home? Regularly fix the edges of walkways and driveways and fix any cracks. Remove anything that might make you trip as you walk through a door, such as a raised step or threshold. Trim any bushes or trees on the path to your home. Use bright outdoor lighting. Clear any walking paths of anything that might make someone trip, such as rocks or tools. Regularly check to see if handrails are loose or broken. Make sure that both sides of any steps have handrails. Any raised decks and porches should have guardrails on the edges. Have any leaves, snow, or ice cleared regularly. Use sand or salt on walking paths during winter. Clean up any spills in your garage right away. This includes oil or grease spills. What can I do in the bathroom? Use night lights. Install grab bars by the toilet and in the tub and shower. Do not use towel bars as grab bars. Use non-skid mats or decals in the tub or shower. If you need to sit down in the shower, use a plastic, non-slip stool. Keep the floor dry. Clean up any water that spills on the floor as soon as it happens. Remove soap buildup in the tub or shower regularly. Attach bath mats securely with double-sided non-slip rug tape. Do not have throw rugs and other things on the floor that can make you trip. What can I do in the bedroom? Use night lights. Make sure that you have a light by your bed that is easy to reach. Do not use  any sheets or blankets that are too big for your bed. They should not hang down onto the floor. Have a firm chair that has side arms. You can use this for support while you get dressed. Do not have throw rugs and other things on the floor that can make you trip. What can I do in the kitchen? Clean up any spills right away. Avoid walking on wet floors. Keep items that you use a lot in easy-to-reach places. If you need to reach something above you, use a strong step stool that has a grab bar. Keep electrical cords out of the way. Do not use floor polish or wax that makes floors slippery. If you must use wax, use non-skid floor wax. Do not have throw rugs and other things on the floor that can make you trip. What can I do with my stairs? Do not leave any items on the stairs. Make sure that there are handrails on both sides of the stairs and use them. Fix handrails that are broken or loose. Make sure that handrails are as long as the stairways. Check any carpeting to make sure that it is firmly attached to the stairs. Fix any carpet that is loose or worn. Avoid having throw rugs at the top or bottom of the stairs. If you do have throw rugs, attach them to  the floor with carpet tape. Make sure that you have a light switch at the top of the stairs and the bottom of the stairs. If you do not have them, ask someone to add them for you. What else can I do to help prevent falls? Wear shoes that: Do not have high heels. Have rubber bottoms. Are comfortable and fit you well. Are closed at the toe. Do not wear sandals. If you use a stepladder: Make sure that it is fully opened. Do not climb a closed stepladder. Make sure that both sides of the stepladder are locked into place. Ask someone to hold it for you, if possible. Clearly mark and make sure that you can see: Any grab bars or handrails. First and last steps. Where the edge of each step is. Use tools that help you move around (mobility aids)  if they are needed. These include: Canes. Walkers. Scooters. Crutches. Turn on the lights when you go into a dark area. Replace any light bulbs as soon as they burn out. Set up your furniture so you have a clear path. Avoid moving your furniture around. If any of your floors are uneven, fix them. If there are any pets around you, be aware of where they are. Review your medicines with your doctor. Some medicines can make you feel dizzy. This can increase your chance of falling. Ask your doctor what other things that you can do to help prevent falls. This information is not intended to replace advice given to you by your health care provider. Make sure you discuss any questions you have with your health care provider. Document Released: 12/01/2008 Document Revised: 07/13/2015 Document Reviewed: 03/11/2014 Elsevier Interactive Patient Education  2017 [employer].

## 2021-07-31 ENCOUNTER — Other Ambulatory Visit: Payer: Self-pay

## 2021-07-31 ENCOUNTER — Encounter: Payer: Self-pay | Admitting: Internal Medicine

## 2021-07-31 DIAGNOSIS — E782 Mixed hyperlipidemia: Secondary | ICD-10-CM | POA: Diagnosis not present

## 2021-07-31 DIAGNOSIS — I2510 Atherosclerotic heart disease of native coronary artery without angina pectoris: Secondary | ICD-10-CM | POA: Diagnosis not present

## 2021-07-31 DIAGNOSIS — I10 Essential (primary) hypertension: Secondary | ICD-10-CM | POA: Diagnosis not present

## 2021-07-31 DIAGNOSIS — I340 Nonrheumatic mitral (valve) insufficiency: Secondary | ICD-10-CM | POA: Diagnosis not present

## 2021-07-31 LAB — AFP TUMOR MARKER: AFP, Serum, Tumor Marker: 3.1 ng/mL (ref 0.0–9.2)

## 2021-07-31 MED ORDER — RALOXIFENE HCL 60 MG PO TABS
ORAL_TABLET | ORAL | 3 refills | Status: DC
  Filled 2021-07-31: qty 120, 120d supply, fill #0

## 2021-08-03 ENCOUNTER — Ambulatory Visit (INDEPENDENT_AMBULATORY_CARE_PROVIDER_SITE_OTHER): Payer: Medicare Other | Admitting: Nurse Practitioner

## 2021-08-03 ENCOUNTER — Encounter: Payer: Self-pay | Admitting: Nurse Practitioner

## 2021-08-03 VITALS — BP 94/63 | HR 60 | Temp 97.8°F | Ht 62.13 in | Wt 172.6 lb

## 2021-08-03 DIAGNOSIS — E1169 Type 2 diabetes mellitus with other specified complication: Secondary | ICD-10-CM | POA: Diagnosis not present

## 2021-08-03 DIAGNOSIS — N184 Chronic kidney disease, stage 4 (severe): Secondary | ICD-10-CM

## 2021-08-03 DIAGNOSIS — D500 Iron deficiency anemia secondary to blood loss (chronic): Secondary | ICD-10-CM | POA: Diagnosis not present

## 2021-08-03 DIAGNOSIS — E039 Hypothyroidism, unspecified: Secondary | ICD-10-CM

## 2021-08-03 DIAGNOSIS — K7290 Hepatic failure, unspecified without coma: Secondary | ICD-10-CM

## 2021-08-03 DIAGNOSIS — I480 Paroxysmal atrial fibrillation: Secondary | ICD-10-CM

## 2021-08-03 DIAGNOSIS — D6869 Other thrombophilia: Secondary | ICD-10-CM

## 2021-08-03 DIAGNOSIS — D696 Thrombocytopenia, unspecified: Secondary | ICD-10-CM | POA: Diagnosis not present

## 2021-08-03 DIAGNOSIS — I5032 Chronic diastolic (congestive) heart failure: Secondary | ICD-10-CM | POA: Diagnosis not present

## 2021-08-03 DIAGNOSIS — E785 Hyperlipidemia, unspecified: Secondary | ICD-10-CM

## 2021-08-03 DIAGNOSIS — E1122 Type 2 diabetes mellitus with diabetic chronic kidney disease: Secondary | ICD-10-CM | POA: Diagnosis not present

## 2021-08-03 DIAGNOSIS — K7460 Unspecified cirrhosis of liver: Secondary | ICD-10-CM

## 2021-08-03 DIAGNOSIS — I130 Hypertensive heart and chronic kidney disease with heart failure and stage 1 through stage 4 chronic kidney disease, or unspecified chronic kidney disease: Secondary | ICD-10-CM | POA: Diagnosis not present

## 2021-08-03 LAB — BAYER DCA HB A1C WAIVED: HB A1C (BAYER DCA - WAIVED): 5.3 % (ref 4.8–5.6)

## 2021-08-03 MED ORDER — ACCU-CHEK AVIVA PLUS VI STRP: ORAL_STRIP | 12 refills | Status: DC

## 2021-08-03 NOTE — Assessment & Plan Note (Signed)
Secondary to Xarelto use.  Continue to monitor for bleeding and wounds.  Gentle skin care at home.  Followed by hematology for anemia.

## 2021-08-03 NOTE — Assessment & Plan Note (Signed)
Noted on labs, followed by hematology, continue this collaboration.

## 2021-08-03 NOTE — Assessment & Plan Note (Signed)
Chronic, ongoing with rate controlled at this time.  Continue collaboration with cardiology and medication regimen as prescribed by Dr. Chancy Milroy.  Continue Xarelto, Amiodarone, and Metoprolol.

## 2021-08-03 NOTE — Assessment & Plan Note (Signed)
New diagnosis as of April 2023.  Followed by GI, will continue this collaboration. Recent note and labs reviewed.  Palliative referral in place.

## 2021-08-03 NOTE — Assessment & Plan Note (Signed)
Chronic, followed by nephrology at this time.  Urine micro alb 10 and A:C <17 May 2021.  Continue Olmesartan for kidney protection and monitor medications for renal dosing.  Labs up to date.  Avoid nephrotoxic medications.

## 2021-08-03 NOTE — Assessment & Plan Note (Signed)
Chronic, with BP at goal today in office and at home.  Continue current medication regimen as prescribed by cardiology and collaboration with cardiology and nephrology. LABS: up to date.  Recommend she continue to monitor BP regularly at home and document + bring to visits.  DASH diet focus.  Return in 3 months.

## 2021-08-03 NOTE — Assessment & Plan Note (Signed)
Chronic, with A1c 5.3% today and urine ALB 10 in March 2023.  Will discontinue Januvia at this time and continue [place] for heart and kidney health.  Recommend focus on diabetic diet at home.  Return in 3 months.

## 2021-08-03 NOTE — Assessment & Plan Note (Signed)
Chronic, ongoing.  Continue current medication regimen and adjust as needed.  Lipid panel up to date. 

## 2021-08-03 NOTE — Progress Notes (Signed)
BP 94/63   Pulse 60   Temp 97.8 F (36.6 C) (Oral)   Ht 5' 2.13" (1.578 m)   Wt 172 lb 9.6 oz (78.3 kg)   SpO2 97%   BMI 31.44 kg/m    Subjective:    Patient ID: Erica Larsen, female    DOB: 1951/02/07, 71 y.o.   MRN: [id number]  HPI: Erica Larsen is a 71 y.o. female  Chief Complaint  Patient presents with   Diabetes   Hyperlipidemia   Atrial Fibrillation   Chronic Kidney Disease   DIABETES Last A1c March 5.8%.  Continues on Januvia 25 MG (renal dosed) and Farxiga 10 MG. Hypoglycemic episodes:no Polydipsia/polyuria: no Visual disturbance: no Chest pain: no Paresthesias: no Glucose Monitoring: rarely             Accucheck frequency: rarely             Fasting glucose:              Post prandial:             Evening:             Before meals: Taking Insulin?: no             Long acting insulin:             Short acting insulin: Blood Pressure Monitoring: daily Retinal Examination: Not up to Date Foot Exam: Up to Date Pneumovax: Up to Date Influenza: Up to Date Aspirin: no   CIRRHOSIS New diagnosis.  Currently being followed by GI with recent visit on 07/24/21, had paracentesis on 07/26/21 and pulled 7 L -- this helped her feel some better.  Continues to have a decreased appetite.  Plan is to perform TIPS procedure once cleared by cardiology.  Has not drank alcohol in 2 years -- did not drink alcohol often (one to two times a month).  Does endorse this being overwhelming, palliative referral in. Status: stable Treatments attempted: followed by GI Fever: no Nausea:  sometimes Vomiting: no Weight loss: yes has lost 14 pounds, has had fluid removed too Decreased appetite: yes Diarrhea: yes Constipation: no Blood in stool: no Heartburn: no Jaundice:  not that she notices  ATRIAL FIBRILLATION Followed by cardiology, last saw 07/31/21 -- was scheduled for echo.  No changes made. Continues on Amiodarone. Atrial fibrillation status: stable Satisfied with current  treatment: yes  Medication side effects:  no Medication compliance: good compliance Etiology of atrial fibrillation: unknown Palpitations:  no Chest pain:  no Dyspnea on exertion: none Orthopnea:  no Syncope:  no Edema: a little bit at baseline Ventricular rate control: B-blocker Anti-coagulation: long acting   HYPERTENSION / HYPERLIPIDEMIA/CHF Continues on Atorvastatin for HLD.   Followed by Dr. Humphrey Rolls, cardiology.  Continues on Olmesartan, Amlodipine, Amiodarone, Metoprolol, Lasix.  Last echo on chart with EF 49% in May 2020. Satisfied with current treatment? yes Duration of hypertension: chronic BP monitoring frequency: not recently BP range:  BP medication side effects: no Duration of hyperlipidemia: chronic Cholesterol medication side effects: no Cholesterol supplements: none Medication compliance: good compliance Aspirin: no Recent stressors: no Recurrent headaches: no Visual changes: no Palpitations: no Dyspnea: with abdominal distension Chest pain: no Lower extremity edema: occasional Dizzy/lightheaded: no    CHRONIC KIDNEY DISEASE Saw [facility] saw on 06/11/21 -- GFR 36, CRT 1.53, BUN 14, PTH 71, PLT 147.  Has history of IV contract exposure in June 2018.   Is currently followed by hematology for  chronic iron deficiency anemia with last visit on 06/22/21 - with infusion on 06/22/21. CKD status: stable Medications renally dose: yes Previous renal evaluation: yes Pneumovax:  Up to Date Influenza Vaccine:  Up to Date  HYPOTHYROIDISM Continues on Levothyroxine 112 MCG daily. Thyroid control status:stable Satisfied with current treatment? yes Medication side effects: no Medication compliance: good compliance Etiology of hypothyroidism:  Recent dose adjustment:no Fatigue: no Cold intolerance: no Heat intolerance: no Weight gain: no Weight loss: no Constipation: no Diarrhea/loose stools: for one month Palpitations: no Lower extremity edema:  no Anxiety/depressed mood: no   Relevant past medical, surgical, family and social history reviewed and updated as indicated. Interim medical history since our last visit reviewed. Allergies and medications reviewed and updated.  Review of Systems  Constitutional:  Negative for activity change, appetite change, diaphoresis, fatigue and fever.  Respiratory:  Negative for cough, chest tightness and shortness of breath.   Cardiovascular:  Negative for chest pain, palpitations and leg swelling.  Gastrointestinal: Negative.   Endocrine: Negative for cold intolerance, heat intolerance, polydipsia, polyphagia and polyuria.  Neurological: Negative.   Psychiatric/Behavioral: Negative.      Per HPI unless specifically indicated above     Objective:    BP 94/63   Pulse 60   Temp 97.8 F (36.6 C) (Oral)   Ht 5' 2.13" (1.578 m)   Wt 172 lb 9.6 oz (78.3 kg)   SpO2 97%   BMI 31.44 kg/m   Wt Readings from Last 3 Encounters:  08/03/21 172 lb 9.6 oz (78.3 kg)  07/24/21 184 lb (83.5 kg)  06/27/21 186 lb (84.4 kg)    Physical Exam Vitals and nursing note reviewed.  Constitutional:      General: She is awake. She is not in acute distress.    Appearance: She is well-developed and well-groomed. She is morbidly obese. She is not ill-appearing.  HENT:     Head: Normocephalic.     Right Ear: Hearing normal.     Left Ear: Hearing normal.  Eyes:     General: Lids are normal. Scleral icterus present.        Right eye: No discharge.        Left eye: No discharge.     Conjunctiva/sclera: Conjunctivae normal.     Pupils: Pupils are equal, round, and reactive to light.  Neck:     Thyroid: No thyromegaly.     Vascular: No carotid bruit.  Cardiovascular:     Rate and Rhythm: Normal rate and regular rhythm.     Heart sounds: Murmur heard.     Systolic murmur is present with a grade of 2/6.     No gallop.  Pulmonary:     Effort: Pulmonary effort is normal. No accessory muscle usage or  respiratory distress.     Breath sounds: Normal breath sounds.  Abdominal:     General: Bowel sounds are normal. There is distension (mild).     Palpations: Abdomen is soft.     Tenderness: There is no abdominal tenderness. There is no right CVA tenderness or left CVA tenderness.  Musculoskeletal:     Cervical back: Normal range of motion and neck supple.     Right hip: Normal.     Left hip: No tenderness, bony tenderness or crepitus. Normal range of motion. Normal strength.     Right lower leg: No edema.     Left lower leg: No edema.  Skin:    General: Skin is warm and dry.  Neurological:  Mental Status: She is alert and oriented to person, place, and time.  Psychiatric:        Attention and Perception: Attention normal.        Mood and Affect: Mood normal.        Behavior: Behavior normal. Behavior is cooperative.        Thought Content: Thought content normal.        Judgment: Judgment normal.    Results for orders placed or performed in visit on 08/03/21  Bayer DCA Hb A1c Waived  Result Value Ref Range   HB A1C (BAYER DCA - WAIVED) 5.3 4.8 - 5.6 %      Assessment & Plan:   Problem List Items Addressed This Visit       Cardiovascular and Mediastinum   Atrial fibrillation (HCC)    Chronic, ongoing with rate controlled at this time.  Continue collaboration with cardiology and medication regimen as prescribed by Dr. Chancy Milroy.  Continue Xarelto, Amiodarone, and Metoprolol.        Chronic diastolic heart failure (HCC)    Chronic, ongoing.  Euvolemic today.  Continue collaboration with cardiology and current medication regimen.  Recommend: - Reminded to call for an overnight weight gain of >2 pounds or a weekly weight weight of >5 pounds - not adding salt to his food and has been reading food labels. 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  Continue current medication regimen as prescribed by cardiology and collaboration with cardiology and nephrology. LABS: up to date.  Recommend she continue to monitor BP regularly at home and document + bring to visits.  DASH diet focus.  Return in 3 months.      Relevant Orders   Bayer DCA Hb A1c Waived (Completed)     Digestive   Decompensated hepatic cirrhosis [facility])    New diagnosis as of April 2023.  Followed by GI, will continue this collaboration. Recent note and labs reviewed.  Palliative referral in place.        Endocrine   Hyperlipidemia associated with type 2 diabetes mellitus (HCC)    Chronic, ongoing.  Continue current medication regimen and adjust as needed.  Lipid panel up to date.      Relevant Orders   Bayer DCA Hb A1c Waived (Completed)   Hypothyroid    Chronic, ongoing.  Continue current Levothyroxine dose and adjust as needed based on lab results.  Thyroid labs up to date.      Type 2 diabetes mellitus with stage 4 chronic kidney disease, without long-term current use of insulin (HCC) - Primary    Chronic, with A1c 5.3% today and urine ALB 10 in March 2023.  Will discontinue Januvia at this time and continue [place] for heart and kidney health.  Recommend focus on diabetic diet at home.  Return in 3 months.        Relevant Orders   Bayer DCA Hb A1c Waived (Completed)     Genitourinary   Chronic kidney disease, stage 4 (severe) (HCC)    Chronic, followed by nephrology at this time.  Urine micro alb 10 and A:C <17 May 2021.  Continue Olmesartan for kidney protection and monitor medications for renal dosing.  Labs up to date.  Avoid nephrotoxic medications.  Hematopoietic and Hemostatic   Acquired thrombophilia ([place])    Secondary to Xarelto use.  Continue to monitor for bleeding and wounds.  Gentle skin care at home.  Followed by hematology for anemia.      Thrombocytopenia ([place])     Noted on labs, followed by hematology, continue this collaboration.        Other   Iron deficiency anemia due to chronic blood loss    Chronic, ongoing, followed by hematology.  Continue this collaboration, recent notes reviewed.      Morbid obesity (HCC)    BMI 34.79, has lost weight after recent paracentesis -- monitor closely. Recommended eating smaller high protein, low fat meals more frequently and exercising 30 mins a day 5 times a week with a goal of 10-15lb weight loss in the next 3 months. Patient voiced their understanding and motivation to adhere to these recommendations.         Follow up plan: Return in about 3 months (around 11/03/2021) for T2DM, HTN/HLD, CIRRHOSIS, CKD, A-FIB, HF, THYROID.

## 2021-08-03 NOTE — Assessment & Plan Note (Signed)
Chronic, ongoing, followed by hematology.  Continue this collaboration, recent notes reviewed.

## 2021-08-03 NOTE — Assessment & Plan Note (Signed)
Chronic, ongoing.  Continue current Levothyroxine dose and adjust as needed based on lab results.  Thyroid labs up to date.

## 2021-08-03 NOTE — Assessment & Plan Note (Signed)
Chronic, ongoing.  Euvolemic today.  Continue collaboration with cardiology and current medication regimen.  Recommend: - Reminded to call for an overnight weight gain of >2 pounds or a weekly weight weight of >5 pounds - not adding salt to his food and has been reading food labels. Reviewed the importance of keeping daily sodium intake to '2000mg'$  daily  - Avoid Ibuprofen products

## 2021-08-03 NOTE — Assessment & Plan Note (Signed)
BMI 34.79, has lost weight after recent paracentesis -- monitor closely. Recommended eating smaller high protein, low fat meals more frequently and exercising 30 mins a day 5 times a week with a goal of 10-15lb weight loss in the next 3 months. Patient voiced their understanding and motivation to adhere to these recommendations.

## 2021-08-08 DIAGNOSIS — I340 Nonrheumatic mitral (valve) insufficiency: Secondary | ICD-10-CM | POA: Diagnosis not present

## 2021-08-12 ENCOUNTER — Encounter
Admission: EM | Disposition: A | Discharge status: Other Acute Inpatient Hospital | Payer: Self-pay | Source: Home / Self Care | Attending: Pulmonary Disease

## 2021-08-12 ENCOUNTER — Other Ambulatory Visit: Payer: Self-pay

## 2021-08-12 ENCOUNTER — Inpatient Hospital Stay
Admit: 2021-08-12 | Discharge: 2021-08-12 | Disposition: A | Discharge status: Home / Self Care | Payer: Medicare Other | Attending: Pulmonary Disease | Admitting: Pulmonary Disease

## 2021-08-12 ENCOUNTER — Emergency Department: Payer: Medicare Other

## 2021-08-12 ENCOUNTER — Inpatient Hospital Stay: Payer: Medicare Other | Admitting: Anesthesiology

## 2021-08-12 ENCOUNTER — Inpatient Hospital Stay
Admission: EM | Admit: 2021-08-12 | Discharge: 2021-08-12 | DRG: 432 | Disposition: A | Discharge status: Other Acute Inpatient Hospital | Payer: Medicare Other | Attending: Pulmonary Disease | Admitting: Pulmonary Disease

## 2021-08-12 ENCOUNTER — Encounter: Payer: Self-pay | Admitting: Pulmonary Disease

## 2021-08-12 ENCOUNTER — Inpatient Hospital Stay (HOSPITAL_COMMUNITY)
Admission: AD | Admit: 2021-08-12 | Discharge: 2021-08-18 | DRG: 405 | Disposition: E | Payer: Medicare Other | Source: Other Acute Inpatient Hospital | Attending: Internal Medicine | Admitting: Internal Medicine

## 2021-08-12 DIAGNOSIS — E8779 Other fluid overload: Secondary | ICD-10-CM | POA: Diagnosis not present

## 2021-08-12 DIAGNOSIS — Z7189 Other specified counseling: Secondary | ICD-10-CM | POA: Diagnosis not present

## 2021-08-12 DIAGNOSIS — I4891 Unspecified atrial fibrillation: Secondary | ICD-10-CM | POA: Diagnosis present

## 2021-08-12 DIAGNOSIS — E7220 Disorder of urea cycle metabolism, unspecified: Secondary | ICD-10-CM

## 2021-08-12 DIAGNOSIS — Z8349 Family history of other endocrine, nutritional and metabolic diseases: Secondary | ICD-10-CM

## 2021-08-12 DIAGNOSIS — K7460 Unspecified cirrhosis of liver: Secondary | ICD-10-CM | POA: Diagnosis not present

## 2021-08-12 DIAGNOSIS — D62 Acute posthemorrhagic anemia: Secondary | ICD-10-CM | POA: Diagnosis present

## 2021-08-12 DIAGNOSIS — E1122 Type 2 diabetes mellitus with diabetic chronic kidney disease: Secondary | ICD-10-CM | POA: Diagnosis not present

## 2021-08-12 DIAGNOSIS — R0602 Shortness of breath: Secondary | ICD-10-CM | POA: Diagnosis not present

## 2021-08-12 DIAGNOSIS — N179 Acute kidney failure, unspecified: Secondary | ICD-10-CM | POA: Diagnosis not present

## 2021-08-12 DIAGNOSIS — K3189 Other diseases of stomach and duodenum: Secondary | ICD-10-CM | POA: Diagnosis present

## 2021-08-12 DIAGNOSIS — Z91040 Latex allergy status: Secondary | ICD-10-CM

## 2021-08-12 DIAGNOSIS — I5032 Chronic diastolic (congestive) heart failure: Secondary | ICD-10-CM | POA: Diagnosis present

## 2021-08-12 DIAGNOSIS — K449 Diaphragmatic hernia without obstruction or gangrene: Secondary | ICD-10-CM | POA: Diagnosis not present

## 2021-08-12 DIAGNOSIS — I340 Nonrheumatic mitral (valve) insufficiency: Secondary | ICD-10-CM | POA: Diagnosis present

## 2021-08-12 DIAGNOSIS — J9601 Acute respiratory failure with hypoxia: Secondary | ICD-10-CM | POA: Diagnosis not present

## 2021-08-12 DIAGNOSIS — I2510 Atherosclerotic heart disease of native coronary artery without angina pectoris: Secondary | ICD-10-CM | POA: Diagnosis present

## 2021-08-12 DIAGNOSIS — Z9889 Other specified postprocedural states: Secondary | ICD-10-CM | POA: Diagnosis not present

## 2021-08-12 DIAGNOSIS — R188 Other ascites: Secondary | ICD-10-CM

## 2021-08-12 DIAGNOSIS — Z66 Do not resuscitate: Secondary | ICD-10-CM | POA: Diagnosis not present

## 2021-08-12 DIAGNOSIS — I959 Hypotension, unspecified: Secondary | ICD-10-CM | POA: Diagnosis not present

## 2021-08-12 DIAGNOSIS — I21A1 Myocardial infarction type 2: Secondary | ICD-10-CM | POA: Diagnosis not present

## 2021-08-12 DIAGNOSIS — I499 Cardiac arrhythmia, unspecified: Secondary | ICD-10-CM | POA: Diagnosis not present

## 2021-08-12 DIAGNOSIS — K921 Melena: Secondary | ICD-10-CM | POA: Diagnosis not present

## 2021-08-12 DIAGNOSIS — I2721 Secondary pulmonary arterial hypertension: Secondary | ICD-10-CM | POA: Diagnosis present

## 2021-08-12 DIAGNOSIS — Z8249 Family history of ischemic heart disease and other diseases of the circulatory system: Secondary | ICD-10-CM

## 2021-08-12 DIAGNOSIS — Z841 Family history of disorders of kidney and ureter: Secondary | ICD-10-CM

## 2021-08-12 DIAGNOSIS — E039 Hypothyroidism, unspecified: Secondary | ICD-10-CM | POA: Diagnosis present

## 2021-08-12 DIAGNOSIS — E874 Mixed disorder of acid-base balance: Secondary | ICD-10-CM | POA: Diagnosis present

## 2021-08-12 DIAGNOSIS — K219 Gastro-esophageal reflux disease without esophagitis: Secondary | ICD-10-CM | POA: Diagnosis present

## 2021-08-12 DIAGNOSIS — I8511 Secondary esophageal varices with bleeding: Secondary | ICD-10-CM | POA: Diagnosis present

## 2021-08-12 DIAGNOSIS — E7800 Pure hypercholesterolemia, unspecified: Secondary | ICD-10-CM | POA: Diagnosis present

## 2021-08-12 DIAGNOSIS — I8500 Esophageal varices without bleeding: Secondary | ICD-10-CM

## 2021-08-12 DIAGNOSIS — K922 Gastrointestinal hemorrhage, unspecified: Principal | ICD-10-CM | POA: Diagnosis present

## 2021-08-12 DIAGNOSIS — N1832 Chronic kidney disease, stage 3b: Secondary | ICD-10-CM | POA: Diagnosis not present

## 2021-08-12 DIAGNOSIS — Z515 Encounter for palliative care: Secondary | ICD-10-CM

## 2021-08-12 DIAGNOSIS — R112 Nausea with vomiting, unspecified: Secondary | ICD-10-CM | POA: Diagnosis not present

## 2021-08-12 DIAGNOSIS — Z79899 Other long term (current) drug therapy: Secondary | ICD-10-CM

## 2021-08-12 DIAGNOSIS — B955 Unspecified streptococcus as the cause of diseases classified elsewhere: Secondary | ICD-10-CM | POA: Diagnosis not present

## 2021-08-12 DIAGNOSIS — I493 Ventricular premature depolarization: Secondary | ICD-10-CM | POA: Diagnosis present

## 2021-08-12 DIAGNOSIS — K7200 Acute and subacute hepatic failure without coma: Secondary | ICD-10-CM | POA: Diagnosis not present

## 2021-08-12 DIAGNOSIS — Z833 Family history of diabetes mellitus: Secondary | ICD-10-CM

## 2021-08-12 DIAGNOSIS — R7881 Bacteremia: Secondary | ICD-10-CM | POA: Diagnosis not present

## 2021-08-12 DIAGNOSIS — I130 Hypertensive heart and chronic kidney disease with heart failure and stage 1 through stage 4 chronic kidney disease, or unspecified chronic kidney disease: Secondary | ICD-10-CM | POA: Diagnosis present

## 2021-08-12 DIAGNOSIS — J690 Pneumonitis due to inhalation of food and vomit: Secondary | ICD-10-CM | POA: Diagnosis not present

## 2021-08-12 DIAGNOSIS — K7581 Nonalcoholic steatohepatitis (NASH): Secondary | ICD-10-CM | POA: Diagnosis present

## 2021-08-12 DIAGNOSIS — K766 Portal hypertension: Secondary | ICD-10-CM | POA: Diagnosis present

## 2021-08-12 DIAGNOSIS — Z7901 Long term (current) use of anticoagulants: Secondary | ICD-10-CM

## 2021-08-12 DIAGNOSIS — R197 Diarrhea, unspecified: Secondary | ICD-10-CM

## 2021-08-12 DIAGNOSIS — E875 Hyperkalemia: Secondary | ICD-10-CM | POA: Diagnosis present

## 2021-08-12 DIAGNOSIS — Z8616 Personal history of COVID-19: Secondary | ICD-10-CM

## 2021-08-12 DIAGNOSIS — Z881 Allergy status to other antibiotic agents status: Secondary | ICD-10-CM

## 2021-08-12 DIAGNOSIS — R6889 Other general symptoms and signs: Secondary | ICD-10-CM | POA: Diagnosis not present

## 2021-08-12 DIAGNOSIS — I480 Paroxysmal atrial fibrillation: Secondary | ICD-10-CM | POA: Diagnosis not present

## 2021-08-12 DIAGNOSIS — Z882 Allergy status to sulfonamides status: Secondary | ICD-10-CM

## 2021-08-12 DIAGNOSIS — J90 Pleural effusion, not elsewhere classified: Secondary | ICD-10-CM | POA: Diagnosis not present

## 2021-08-12 DIAGNOSIS — R410 Disorientation, unspecified: Secondary | ICD-10-CM

## 2021-08-12 DIAGNOSIS — R579 Shock, unspecified: Secondary | ICD-10-CM | POA: Diagnosis not present

## 2021-08-12 DIAGNOSIS — K7689 Other specified diseases of liver: Secondary | ICD-10-CM | POA: Diagnosis not present

## 2021-08-12 DIAGNOSIS — N184 Chronic kidney disease, stage 4 (severe): Secondary | ICD-10-CM | POA: Diagnosis present

## 2021-08-12 DIAGNOSIS — Z6831 Body mass index (BMI) 31.0-31.9, adult: Secondary | ICD-10-CM | POA: Diagnosis not present

## 2021-08-12 DIAGNOSIS — Z9841 Cataract extraction status, right eye: Secondary | ICD-10-CM

## 2021-08-12 DIAGNOSIS — R578 Other shock: Secondary | ICD-10-CM | POA: Diagnosis present

## 2021-08-12 DIAGNOSIS — R011 Cardiac murmur, unspecified: Secondary | ICD-10-CM | POA: Diagnosis not present

## 2021-08-12 DIAGNOSIS — E871 Hypo-osmolality and hyponatremia: Secondary | ICD-10-CM | POA: Diagnosis present

## 2021-08-12 DIAGNOSIS — E872 Acidosis, unspecified: Secondary | ICD-10-CM

## 2021-08-12 DIAGNOSIS — K767 Hepatorenal syndrome: Secondary | ICD-10-CM | POA: Diagnosis not present

## 2021-08-12 DIAGNOSIS — I447 Left bundle-branch block, unspecified: Secondary | ICD-10-CM | POA: Diagnosis present

## 2021-08-12 DIAGNOSIS — R58 Hemorrhage, not elsewhere classified: Secondary | ICD-10-CM | POA: Diagnosis not present

## 2021-08-12 DIAGNOSIS — N170 Acute kidney failure with tubular necrosis: Secondary | ICD-10-CM | POA: Diagnosis present

## 2021-08-12 DIAGNOSIS — Z79890 Hormone replacement therapy (postmenopausal): Secondary | ICD-10-CM

## 2021-08-12 DIAGNOSIS — E669 Obesity, unspecified: Secondary | ICD-10-CM | POA: Diagnosis present

## 2021-08-12 DIAGNOSIS — Z803 Family history of malignant neoplasm of breast: Secondary | ICD-10-CM

## 2021-08-12 DIAGNOSIS — D638 Anemia in other chronic diseases classified elsewhere: Secondary | ICD-10-CM | POA: Diagnosis not present

## 2021-08-12 DIAGNOSIS — Z955 Presence of coronary angioplasty implant and graft: Secondary | ICD-10-CM

## 2021-08-12 DIAGNOSIS — K920 Hematemesis: Secondary | ICD-10-CM | POA: Diagnosis not present

## 2021-08-12 DIAGNOSIS — R5381 Other malaise: Secondary | ICD-10-CM | POA: Diagnosis present

## 2021-08-12 DIAGNOSIS — Z85828 Personal history of other malignant neoplasm of skin: Secondary | ICD-10-CM

## 2021-08-12 DIAGNOSIS — R791 Abnormal coagulation profile: Secondary | ICD-10-CM | POA: Diagnosis present

## 2021-08-12 DIAGNOSIS — E8721 Acute metabolic acidosis: Secondary | ICD-10-CM | POA: Diagnosis not present

## 2021-08-12 DIAGNOSIS — F40240 Claustrophobia: Secondary | ICD-10-CM | POA: Diagnosis present

## 2021-08-12 DIAGNOSIS — Z9842 Cataract extraction status, left eye: Secondary | ICD-10-CM

## 2021-08-12 DIAGNOSIS — D649 Anemia, unspecified: Secondary | ICD-10-CM | POA: Diagnosis not present

## 2021-08-12 DIAGNOSIS — I129 Hypertensive chronic kidney disease with stage 1 through stage 4 chronic kidney disease, or unspecified chronic kidney disease: Secondary | ICD-10-CM | POA: Diagnosis not present

## 2021-08-12 DIAGNOSIS — I8501 Esophageal varices with bleeding: Secondary | ICD-10-CM | POA: Diagnosis not present

## 2021-08-12 DIAGNOSIS — R778 Other specified abnormalities of plasma proteins: Secondary | ICD-10-CM | POA: Diagnosis not present

## 2021-08-12 DIAGNOSIS — K529 Noninfective gastroenteritis and colitis, unspecified: Secondary | ICD-10-CM | POA: Diagnosis present

## 2021-08-12 DIAGNOSIS — I8510 Secondary esophageal varices without bleeding: Secondary | ICD-10-CM | POA: Diagnosis not present

## 2021-08-12 HISTORY — PX: ESOPHAGOGASTRODUODENOSCOPY (EGD) WITH PROPOFOL: SHX5813

## 2021-08-12 LAB — BLOOD CULTURE ID PANEL (REFLEXED) - BCID2

## 2021-08-12 LAB — BLOOD GAS, ARTERIAL
Acid-base deficit: 8.6 mmol/L — ABNORMAL HIGH (ref 0.0–2.0)
Bicarbonate: 14.5 mmol/L — ABNORMAL LOW (ref 20.0–28.0)
O2 Saturation: 97.9 %
Patient temperature: 37
pCO2 arterial: 24 mmHg — ABNORMAL LOW (ref 32–48)
pH, Arterial: 7.39 (ref 7.35–7.45)
pO2, Arterial: 90 mmHg (ref 83–108)

## 2021-08-12 LAB — CBC WITH DIFFERENTIAL/PLATELET
Abs Immature Granulocytes: 0.12 10*3/uL — ABNORMAL HIGH (ref 0.00–0.07)
Basophils Absolute: 0 10*3/uL (ref 0.0–0.1)
Basophils Relative: 0 %
Eosinophils Absolute: 0.1 10*3/uL (ref 0.0–0.5)
Eosinophils Relative: 1 %
HCT: 31.6 % — ABNORMAL LOW (ref 36.0–46.0)
Hemoglobin: 11.1 g/dL — ABNORMAL LOW (ref 12.0–15.0)
Immature Granulocytes: 1 %
Lymphocytes Relative: 18 %
Lymphs Abs: 2.7 10*3/uL (ref 0.7–4.0)
MCH: 31.5 pg (ref 26.0–34.0)
MCHC: 35.1 g/dL (ref 30.0–36.0)
MCV: 89.8 fL (ref 80.0–100.0)
Monocytes Absolute: 1 10*3/uL (ref 0.1–1.0)
Monocytes Relative: 7 %
Neutro Abs: 11.3 10*3/uL — ABNORMAL HIGH (ref 1.7–7.7)
Neutrophils Relative %: 73 %
Platelets: 100 10*3/uL — ABNORMAL LOW (ref 150–400)
RBC: 3.52 MIL/uL — ABNORMAL LOW (ref 3.87–5.11)
RDW: 20.8 % — ABNORMAL HIGH (ref 11.5–15.5)
WBC: 15.2 10*3/uL — ABNORMAL HIGH (ref 4.0–10.5)
nRBC: 0 % (ref 0.0–0.2)

## 2021-08-12 LAB — COMPREHENSIVE METABOLIC PANEL
ALT: 36 U/L (ref 0–44)
AST: 71 U/L — ABNORMAL HIGH (ref 15–41)
Albumin: 2.7 g/dL — ABNORMAL LOW (ref 3.5–5.0)
Alkaline Phosphatase: 75 U/L (ref 38–126)
Anion gap: 16 — ABNORMAL HIGH (ref 5–15)
BUN: 103 mg/dL — ABNORMAL HIGH (ref 8–23)
CO2: 14 mmol/L — ABNORMAL LOW (ref 22–32)
Calcium: 8.3 mg/dL — ABNORMAL LOW (ref 8.9–10.3)
Chloride: 102 mmol/L (ref 98–111)
Creatinine, Ser: 8.64 mg/dL — ABNORMAL HIGH (ref 0.44–1.00)
GFR, Estimated: 4 mL/min — ABNORMAL LOW (ref >60)
Glucose, Bld: 148 mg/dL — ABNORMAL HIGH (ref 70–99)
Potassium: 4.4 mmol/L (ref 3.5–5.1)
Sodium: 132 mmol/L — ABNORMAL LOW (ref 135–145)
Total Bilirubin: 2 mg/dL — ABNORMAL HIGH (ref 0.3–1.2)
Total Protein: 6 g/dL — ABNORMAL LOW (ref 6.5–8.1)

## 2021-08-12 LAB — APTT: aPTT: 31 seconds (ref 24–36)

## 2021-08-12 LAB — AMMONIA: Ammonia: 65 umol/L — ABNORMAL HIGH (ref 9–35)

## 2021-08-12 LAB — URINALYSIS, ROUTINE W REFLEX MICROSCOPIC
Bilirubin Urine: NEGATIVE
Glucose, UA: 50 mg/dL — AB
Ketones, ur: NEGATIVE mg/dL
Nitrite: NEGATIVE
Protein, ur: NEGATIVE mg/dL
Specific Gravity, Urine: 1.013 (ref 1.005–1.030)
pH: 5 (ref 5.0–8.0)

## 2021-08-12 LAB — HEMOGLOBIN AND HEMATOCRIT, BLOOD
HCT: 29.9 % — ABNORMAL LOW (ref 36.0–46.0)
HCT: 27.7 % — ABNORMAL LOW (ref 36.0–46.0)
HCT: 29 % — ABNORMAL LOW (ref 36.0–46.0)
Hemoglobin: 10.3 g/dL — ABNORMAL LOW (ref 12.0–15.0)
Hemoglobin: 9.5 g/dL — ABNORMAL LOW (ref 12.0–15.0)
Hemoglobin: 10 g/dL — ABNORMAL LOW (ref 12.0–15.0)

## 2021-08-12 LAB — GLUCOSE, CAPILLARY
Glucose-Capillary: 159 mg/dL — ABNORMAL HIGH (ref 70–99)
Glucose-Capillary: 164 mg/dL — ABNORMAL HIGH (ref 70–99)

## 2021-08-12 LAB — T4, FREE: Free T4: 1.02 ng/dL (ref 0.61–1.12)

## 2021-08-12 LAB — LACTIC ACID, PLASMA
Lactic Acid, Venous: 4 mmol/L (ref 0.5–1.9)
Lactic Acid, Venous: 4.4 mmol/L (ref 0.5–1.9)
Lactic Acid, Venous: 2.4 mmol/L (ref 0.5–1.9)

## 2021-08-12 LAB — LIPASE, BLOOD: Lipase: 40 U/L (ref 11–51)

## 2021-08-12 LAB — TROPONIN I (HIGH SENSITIVITY)
Troponin I (High Sensitivity): 34 ng/L — ABNORMAL HIGH (ref <18)
Troponin I (High Sensitivity): 33 ng/L — ABNORMAL HIGH (ref <18)

## 2021-08-12 LAB — MAGNESIUM: Magnesium: 1.8 mg/dL (ref 1.7–2.4)

## 2021-08-12 LAB — SODIUM, URINE, RANDOM: Sodium, Ur: 12 mmol/L

## 2021-08-12 LAB — TSH: TSH: 5.056 u[IU]/mL — ABNORMAL HIGH (ref 0.350–4.500)

## 2021-08-12 LAB — FIBRINOGEN: Fibrinogen: 268 mg/dL (ref 210–475)

## 2021-08-12 LAB — PROTIME-INR
INR: 1.3 — ABNORMAL HIGH (ref 0.8–1.2)
Prothrombin Time: 16.5 seconds — ABNORMAL HIGH (ref 11.4–15.2)

## 2021-08-12 LAB — MRSA NEXT GEN BY PCR, NASAL: MRSA by PCR Next Gen: NOT DETECTED

## 2021-08-12 LAB — CREATININE, URINE, RANDOM: Creatinine, Urine: 125.61 mg/dL

## 2021-08-12 SURGERY — ESOPHAGOGASTRODUODENOSCOPY (EGD) WITH PROPOFOL
Anesthesia: General

## 2021-08-12 MED ORDER — LEVOTHYROXINE SODIUM 100 MCG/5ML IV SOLN
75.0000 ug | Freq: Once | INTRAVENOUS | Status: AC
Start: 2021-08-12 — End: 2021-08-12
  Administered 2021-08-12: 75 ug via INTRAVENOUS
  Filled 2021-08-12: qty 5

## 2021-08-12 MED ORDER — ALBUMIN HUMAN 25 % IV SOLN
75.0000 g | Freq: Every day | INTRAVENOUS | Status: DC
Start: 2021-08-13 — End: 2021-08-14
  Administered 2021-08-13: 75 g via INTRAVENOUS
  Filled 2021-08-12 (×3): qty 300

## 2021-08-12 MED ORDER — PANTOPRAZOLE INFUSION (NEW) - SIMPLE MED
8.0000 mg/h | INTRAVENOUS | Status: DC
  Administered 2021-08-13 – 2021-08-15 (×6): 8 mg/h via INTRAVENOUS
  Filled 2021-08-12 (×2): qty 80
  Filled 2021-08-12: qty 100
  Filled 2021-08-12: qty 80
  Filled 2021-08-12 (×2): qty 100
  Filled 2021-08-12 (×2): qty 80

## 2021-08-12 MED ORDER — DOCUSATE SODIUM 100 MG PO CAPS: 100.0000 mg | ORAL_CAPSULE | Freq: Two times a day (BID) | ORAL | Status: DC | PRN

## 2021-08-12 MED ORDER — SUCCINYLCHOLINE CHLORIDE 200 MG/10ML IV SOSY
PREFILLED_SYRINGE | INTRAVENOUS | Status: DC | PRN
  Administered 2021-08-12: 80 mg via INTRAVENOUS

## 2021-08-12 MED ORDER — DEXAMETHASONE SODIUM PHOSPHATE 10 MG/ML IJ SOLN
INTRAMUSCULAR | Status: AC
  Filled 2021-08-12: qty 1

## 2021-08-12 MED ORDER — SODIUM CHLORIDE 0.9 % IV BOLUS
500.0000 mL | Freq: Once | INTRAVENOUS | Status: AC
  Administered 2021-08-12: 500 mL via INTRAVENOUS

## 2021-08-12 MED ORDER — ACETAMINOPHEN 325 MG PO TABS: 650.0000 mg | ORAL_TABLET | ORAL | Status: DC | PRN

## 2021-08-12 MED ORDER — CHLORHEXIDINE GLUCONATE CLOTH 2 % EX PADS
6.0000 | MEDICATED_PAD | Freq: Every day | CUTANEOUS | Status: DC
  Administered 2021-08-12 – 2021-08-14 (×2): 6 via TOPICAL

## 2021-08-12 MED ORDER — ALBUMIN HUMAN 25 % IV SOLN
25.0000 g | Freq: Once | INTRAVENOUS | Status: AC
Start: 2021-08-12 — End: 2021-08-12
  Administered 2021-08-12: 25 g via INTRAVENOUS
  Filled 2021-08-12: qty 100

## 2021-08-12 MED ORDER — POLYETHYLENE GLYCOL 3350 17 G PO PACK: 17.0000 g | PACK | Freq: Every day | ORAL | Status: DC | PRN

## 2021-08-12 MED ORDER — LIDOCAINE HCL (CARDIAC) PF 100 MG/5ML IV SOSY
PREFILLED_SYRINGE | INTRAVENOUS | Status: DC | PRN
  Administered 2021-08-12: 80 mg via INTRAVENOUS

## 2021-08-12 MED ORDER — SUCCINYLCHOLINE CHLORIDE 200 MG/10ML IV SOSY
PREFILLED_SYRINGE | INTRAVENOUS | Status: AC
  Filled 2021-08-12: qty 10

## 2021-08-12 MED ORDER — DEXAMETHASONE SODIUM PHOSPHATE 10 MG/ML IJ SOLN
INTRAMUSCULAR | Status: DC | PRN
  Administered 2021-08-12: 4 mg via INTRAVENOUS

## 2021-08-12 MED ORDER — SODIUM CHLORIDE 0.9 % IV SOLN
1.0000 g | INTRAVENOUS | Status: AC
  Administered 2021-08-12: 1 g via INTRAVENOUS
  Filled 2021-08-12: qty 10

## 2021-08-12 MED ORDER — CEFTRIAXONE SODIUM 2 G IJ SOLR
2.0000 g | INTRAMUSCULAR | Status: DC
  Administered 2021-08-13 – 2021-08-14 (×3): 2 g via INTRAVENOUS
  Filled 2021-08-12 (×3): qty 20

## 2021-08-12 MED ORDER — LEVOTHYROXINE SODIUM 100 MCG/5ML IV SOLN: 75.0000 ug | Freq: Every day | INTRAVENOUS | Status: DC

## 2021-08-12 MED ORDER — ONDANSETRON HCL 4 MG/2ML IJ SOLN
INTRAMUSCULAR | Status: DC | PRN
  Administered 2021-08-12: 4 mg via INTRAVENOUS

## 2021-08-12 MED ORDER — ONDANSETRON HCL 4 MG/2ML IJ SOLN
4.0000 mg | INTRAMUSCULAR | Status: AC
  Administered 2021-08-12: 4 mg via INTRAVENOUS
  Filled 2021-08-12: qty 2

## 2021-08-12 MED ORDER — LACTATED RINGERS IV BOLUS (SEPSIS)
500.0000 mL | Freq: Once | INTRAVENOUS | Status: AC
  Administered 2021-08-12: 500 mL via INTRAVENOUS

## 2021-08-12 MED ORDER — PHENYLEPHRINE 80 MCG/ML (10ML) SYRINGE FOR IV PUSH (FOR BLOOD PRESSURE SUPPORT)
PREFILLED_SYRINGE | INTRAVENOUS | Status: DC | PRN
  Administered 2021-08-12 (×2): 160 ug via INTRAVENOUS
  Administered 2021-08-12: 80 ug via INTRAVENOUS

## 2021-08-12 MED ORDER — PANTOPRAZOLE INFUSION (NEW) - SIMPLE MED
8.0000 mg/h | INTRAVENOUS | Status: DC
  Administered 2021-08-12 (×2): 8 mg/h via INTRAVENOUS
  Filled 2021-08-12 (×5): qty 100

## 2021-08-12 MED ORDER — ONDANSETRON HCL 4 MG/2ML IJ SOLN
4.0000 mg | Freq: Four times a day (QID) | INTRAMUSCULAR | Status: DC | PRN
  Administered 2021-08-13: 4 mg via INTRAVENOUS
  Filled 2021-08-12: qty 2

## 2021-08-12 MED ORDER — FENTANYL CITRATE (PF) 100 MCG/2ML IJ SOLN
INTRAMUSCULAR | Status: AC
  Filled 2021-08-12: qty 2

## 2021-08-12 MED ORDER — SODIUM CHLORIDE 0.9 % IV SOLN: INTRAVENOUS | Status: DC | PRN

## 2021-08-12 MED ORDER — PROPOFOL 10 MG/ML IV BOLUS
INTRAVENOUS | Status: DC | PRN
  Administered 2021-08-12: 110 mg via INTRAVENOUS

## 2021-08-12 MED ORDER — NOREPINEPHRINE 4 MG/250ML-% IV SOLN
2.0000 ug/min | INTRAVENOUS | Status: DC
  Administered 2021-08-12: 2 ug/min via INTRAVENOUS
  Administered 2021-08-13: 5 ug/min via INTRAVENOUS
  Filled 2021-08-12 (×2): qty 250

## 2021-08-12 MED ORDER — SODIUM CHLORIDE 0.9 % IV SOLN
50.0000 ug/h | INTRAVENOUS | Status: DC
  Administered 2021-08-12 (×2): 50 ug/h via INTRAVENOUS
  Filled 2021-08-12 (×4): qty 1

## 2021-08-12 MED ORDER — SODIUM CHLORIDE 0.9 % IV SOLN: INTRAVENOUS | Status: DC

## 2021-08-12 MED ORDER — SODIUM CHLORIDE 0.9 % IV BOLUS
500.0000 mL | Freq: Once | INTRAVENOUS | Status: AC
Start: 2021-08-12 — End: 2021-08-12
  Administered 2021-08-12: 500 mL via INTRAVENOUS

## 2021-08-12 MED ORDER — SODIUM CHLORIDE 0.9 % IV SOLN: 1.0000 g | INTRAVENOUS | Status: DC

## 2021-08-12 MED ORDER — CHLORHEXIDINE GLUCONATE CLOTH 2 % EX PADS
6.0000 | MEDICATED_PAD | Freq: Every day | CUTANEOUS | Status: DC
  Administered 2021-08-12: 6 via TOPICAL

## 2021-08-12 MED ORDER — LIDOCAINE HCL (PF) 2 % IJ SOLN
INTRAMUSCULAR | Status: AC
  Filled 2021-08-12: qty 5

## 2021-08-12 MED ORDER — SODIUM CHLORIDE 0.9 % IV SOLN
50.0000 ug/h | INTRAVENOUS | Status: DC
  Administered 2021-08-13 – 2021-08-15 (×5): 50 ug/h via INTRAVENOUS
  Filled 2021-08-12 (×8): qty 1

## 2021-08-12 MED ORDER — ONDANSETRON HCL 4 MG/2ML IJ SOLN: 4.0000 mg | Freq: Four times a day (QID) | INTRAMUSCULAR | Status: DC | PRN

## 2021-08-12 MED ORDER — PANTOPRAZOLE 80MG IVPB - SIMPLE MED
80.0000 mg | Freq: Once | INTRAVENOUS | Status: AC
  Administered 2021-08-12: 80 mg via INTRAVENOUS
  Filled 2021-08-12: qty 100

## 2021-08-12 MED ORDER — FENTANYL CITRATE (PF) 100 MCG/2ML IJ SOLN
INTRAMUSCULAR | Status: DC | PRN
Start: 2021-08-12 — End: 2021-08-12
  Administered 2021-08-12: 50 ug via INTRAVENOUS

## 2021-08-12 MED ORDER — DEXTROSE IN LACTATED RINGERS 5 % IV SOLN: INTRAVENOUS | Status: DC

## 2021-08-12 MED ORDER — PROTHROMBIN COMPLEX CONC HUMAN 500 UNITS IV KIT
3582.0000 [IU] | PACK | INTRAVENOUS | Status: AC
  Administered 2021-08-12: 3582 [IU] via INTRAVENOUS
  Filled 2021-08-12: qty 500

## 2021-08-12 MED ORDER — ONDANSETRON HCL 4 MG/2ML IJ SOLN
INTRAMUSCULAR | Status: AC
  Filled 2021-08-12: qty 2

## 2021-08-12 MED ORDER — PROPOFOL 10 MG/ML IV BOLUS
INTRAVENOUS | Status: AC
  Filled 2021-08-12: qty 20

## 2021-08-12 MED ORDER — OCTREOTIDE LOAD VIA INFUSION
50.0000 ug | Freq: Once | INTRAVENOUS | Status: AC
  Administered 2021-08-12: 50 ug via INTRAVENOUS
  Filled 2021-08-12: qty 25

## 2021-08-12 MED ORDER — SODIUM CHLORIDE 0.9 % IV SOLN: 250.0000 mL | INTRAVENOUS | Status: DC

## 2021-08-12 NOTE — Care Plan (Addendum)
Patient underwent EGD by Dr. Servando Snare today.  Dr. Daleen Squibb describes massive amount of blood in the stomach.  He banded all the viruses that he was able to see.  There was stigmata of prior bleeding but none actively bleeding at present.  He however recommended transfer to [facility] for a TIPS ASAP.  Patient also having issues acute on chronic renal failure requiring CRRT if it does not respond to following resuscitation.  Discussed with critical care at [facility] Dr. Luciano Cutter has accepted in transfer.  Will initiate transfer process.  Current inpatient meds:  Scheduled Meds:  Chlorhexidine Gluconate Cloth  6 each Topical Q0600   [START ON 08/19/2021] levothyroxine  75 mcg Intravenous Daily   Continuous Infusions:  sodium chloride 75 mL/hr at 08/12/21 1557   sodium chloride Stopped (08/12/21 1553)   cefTRIAXone (ROCEPHIN)  IV     octreotide (SANDOSTATIN) 500 mcg in sodium chloride 0.9 % 250 mL (2 mcg/mL) infusion 50 mcg/hr (08/12/21 1557)   pantoprazole Stopped (08/12/21 1252)   PRN Meds:.sodium chloride, docusate sodium, ondansetron (ZOFRAN) IV, polyethylene glycol    C. Danice Goltz, MD Advanced Bronchoscopy PCCM [facility]

## 2021-08-12 NOTE — Anesthesia Procedure Notes (Signed)
Procedure Name: Intubation Date/Time: 08/12/2021 2:30 PM  Performed by: Corinda Gubler, MDPre-anesthesia Checklist: Patient identified, Patient being monitored, Timeout performed, Emergency Drugs available and Suction available Patient Re-evaluated:Patient Re-evaluated prior to induction Oxygen Delivery Method: Circle system utilized Preoxygenation: Pre-oxygenation with 100% oxygen Induction Type: IV induction and Rapid sequence Laryngoscope Size: 3 and McGraph Grade View: Grade I Tube type: Oral Tube size: 7.0 mm Number of attempts: 1 Airway Equipment and Method: Stylet Placement Confirmation: ETT inserted through vocal cords under direct vision, positive ETCO2 and breath sounds checked- equal and bilateral Secured at: 21 cm Tube secured with: Tape Dental Injury: Teeth and Oropharynx as per pre-operative assessment  Comments: Dried blood remnants in oropharynx.

## 2021-08-12 NOTE — Progress Notes (Signed)
Erica Larsen  MRN: [id number]  DOB/AGE: 1950-11-14 71 y.o.  Primary Care Physician:Cannady, Dorie Rank, NP  Admit date: 08/11/2021  Chief Complaint:  Chief Complaint  Patient presents with   GI Bleeding    S-Pt presented on  08/06/2021 with  Chief Complaint  Patient presents with   GI Bleeding  .   Patient is a 71 year old Caucasian female with a past medical history of diabetes mellitus, anemia of chronic disease, atrial fibrillation, CHF, hyperlipidemia, hypertension, hypothyroidism, cirrhosis who came to the ER with chief complaint of GI bleed.   Upon evaluation in the ER patient was found to be hypotensive with BUN of more than 100 and creatinine of 8.6.  Patient was confused and gave a history of having had bright red hematemesis and melena.  Patient was admitted to ICU for further care  Patient is well-known to our practice .Patient follows up with Dr. Thedore Mins as an outpatient.  Patient was seen by Dr. Thedore Mins on June 11, 2021.    Nephrology was consulted because of acute kidney injury Patient was seen in ICU Patient main concern in today visit was that she was not feeling well from past few days . Patient informed me that from past few days she was having heaves and this morning at 4:00 she started having hematemesis as well as bright red blood per rectum. Patient gave a history that she was not taking her medication for the past few days Patient offers no complaint of fever/cough/chills Patient does give a history of decreased oral intake and decreased urine output from past few days   Medications   [MAR Hold] Chlorhexidine Gluconate Cloth  6 each Topical Q0600   [MAR Hold] levothyroxine  75 mcg Intravenous Daily        WGN:FAOZH from the symptoms mentioned above,there are no other symptoms referable to all systems reviewed.  Physical Exam: Vital signs in last 24 hours: Temp:  [97 F (36.1 C)-97.8 F (36.6 C)] 97 F (36.1 C) (06/25 1418) Pulse Rate:  [71-83]  81 (06/25 1418) Resp:  [14-22] 15 (06/25 1418) BP: (81-124)/(32-102) 105/48 (06/25 1418) SpO2:  [95 %-100 %] 99 % (06/25 1418) Weight:  [77.4 kg-78 kg] 77.4 kg (06/25 0841) Weight change:  Last BM Date : 07/19/2021  Intake/Output from previous day: No intake/output data recorded. Total I/O In: 3004.3 [I.V.:486.9; IV Piggyback:2517.4] Out: 20 [Urine:20]   Physical Exam:  General- pt is awake,alert, oriented to time place and person  HEENT-head is atraumatic normocephalic, Patient does have lips tinged with blood  Conjunctivae pale    Resp- No acute REsp distress, decreased at bases  CVS- S1S2 regular in rate and rhythm  GIT- BS+, soft, Non tender ,  distended + ,   EXT- No LE Edema,  No Cyanosis  CNS cranial nerves II through XII are grossly intact patient moving all 4 extremities    Lab Results:  CBC  Recent Labs    07/23/2021 0518 07/20/2021 1008 08/01/2021 1304  WBC 15.2*  --   --   HGB 11.1* 10.3* 9.5*  HCT 31.6* 29.9* 27.7*  PLT 100*  --   --     BMET  Recent Labs    08/01/2021 0518  NA 132*  K 4.4  CL 102  CO2 14*  GLUCOSE 148*  BUN 103*  CREATININE 8.64*  CALCIUM 8.3*      Most recent Creatinine trend  Lab Results  Component Value Date   CREATININE 8.64 (H) 08/16/2021   CREATININE  2.03 (H) 06/20/2021   CREATININE 1.47 (H) 05/11/2021      MICRO   Recent Results (from the past 240 hour(s))  MRSA Next Gen by PCR, Nasal     Status: None   Collection Time: 07/24/2021  8:43 AM   Specimen: Nasal Mucosa; Nasal Swab  Result Value Ref Range Status   MRSA by PCR Next Gen NOT DETECTED NOT DETECTED Final    Comment: (NOTE) The GeneXpert MRSA Assay (FDA approved for NASAL specimens only), is one component of a comprehensive MRSA colonization surveillance program. It is not intended to diagnose MRSA infection nor to guide or monitor treatment for MRSA infections. Test performance is not FDA approved in patients less than 37 years old. Performed at  [facility], [address] [place], [place] [address]       CT abdomen does not show any signs of obstruction Adrenals/Urinary Tract: No adrenal nodule or mass. Kidneys unremarkable. No evidence for hydroureter. Bladder is decompressed.   Impression:   1)Renal    Acute kidney injury AKI secondary to ATN Patient has ATN secondary to hypotension Patient had ARB/diuretics/SGLT2 receptor blockers as an outpatient   AKI secondary to hepatorenal   Patient has AKI on CKD. Patient has CKD secondary to diabetes mellitus Patient has CKD going back to 2016 Patient most likely also has CKD with some contribution from hypertension and age so she declined as patient is 71 years old    2)Hypotension Patient BP is low but is currently not requiring vasopressors  3)Anemia of chronic disease/GI bleed     Latest Ref Rng & Units 08/14/2021    1:04 PM 08/09/2021   10:08 AM 13-Aug-2021    5:18 AM  CBC  WBC 4.0 - 10.5 K/uL   15.2   Hemoglobin 12.0 - 15.0 g/dL 9.5  60.4  54.0   Hematocrit 36.0 - 46.0 % 27.7  29.9  31.6   Platelets 150 - 400 K/uL   100      Patient admitted with GI bleed Patient hemoglobin is  trending down GI is following - Patient is to have EGD   4) Secondary hyperparathyroidism -CKD Mineral-Bone Disorder    Lab Results  Component Value Date   CALCIUM 8.3 (L) 13-Aug-2021    Patient has history of secondary hyperparathyroidism Patient intact PTH was elevated 150 in  April 2022   5)Decompensated hepatic cirrhosis Patient is being followed by the primary team Patient does follow-up with GI as an outpatient   6) Electrolytes      Latest Ref Rng & Units 13-Aug-2021    5:18 AM 06/20/2021   11:30 AM 05/11/2021    1:20 PM  BMP  Glucose 70 - 99 mg/dL 981  191  99   BUN 8 - 23 mg/dL 478  22  10   Creatinine 0.44 - 1.00 mg/dL 2.95  6.21  3.08   BUN/Creat Ratio 12 - 28   7   Sodium 135 - 145 mmol/L 132  140  139   Potassium 3.5 - 5.1 mmol/L 4.4   3.6  4.0   Chloride 98 - 111 mmol/L 102  103  105   CO2 22 - 32 mmol/L 14  28  22    Calcium 8.9 - 10.3 mg/dL 8.3  8.6  8.9      Sodium Hyponatremia Secondary to AKI on CKD   Potassium Normokalemic    7)Metabolic acidosis  Anion gap 132-16 is equal to 16  Albumin of 2.7 Patient  delta anion gap comes out to be  16-8 is equal to 8  Patient delta bicarb gap came out to be 24-14 is equal to 10  Patient has high anion gap acidosis and non-anion gap acidosis   Patient lactic acid is elevated at 4  We will ask for ABG    ABG  was done    Latest Reference Range & Units 2021-08-21 09:13  pH, Arterial 7.35 - 7.45  7.39  pCO2 arterial 32 - 48 mmHg 24 (L)  pO2, Arterial 83 - 108 mmHg 90  Acid-base deficit 0.0 - 2.0 mmol/L 8.6 (H)  Bicarbonate 20.0 - 28.0 mmol/L 14.5 (L)  O2 Saturation % 97.9  Patient temperature  37.0  Collection site  RIGHT RADIAL  Allens test (pass/fail) PASS  PASS  (L): Data is abnormally low (H): Data is abnormally high   Expected PCO2 14 Times 1.5 + 8 +-2= 14+7+8=29 Patient PCO2 was 24 lower than the expected  It showed pt had  high anion gap metabolic acidosis , non anion gap acidosis and respiratory  alkalosis   Pt is over compensated ,As patient pH is well-maintained  Plan:  I spent 45 minutes in patient's critical care as patient has complicated medical issues of acute kidney injury, chronic kidney disease, decompensated cirrhosis, hypotension, acute metabolic acidosis, hyponatremia,   high anion gap metabolic acidosis , non anion gap acidosis and respiratory  alkalosis  .Pt will benefit from norepinephrine and albumin I discussed the  case at length with Critical care and primary team.Educated patient about possible need for renal replacement therapy soon.          Kanoelani Dobies s Wolfgang Phoenix 08-21-21, 2:27 PM

## 2021-08-12 NOTE — Anesthesia Preprocedure Evaluation (Signed)
Anesthesia Evaluation  Patient identified by MRN, date of birth, ID band Patient awake  General Assessment Comment:Patient admitted for hematemesis and melena, confusion.  On my exam, patient is AOx3, understands which procedure she is to have and why.  Reviewed: Allergy & Precautions, H&P , NPO status , Patient's Chart, lab work & pertinent test results, reviewed documented beta blocker date and time   History of Anesthesia Complications Negative for: history of anesthetic complications  Airway Mallampati: III  TM Distance: >3 FB Neck ROM: full    Dental  (+) Caps, Missing, Poor Dentition,    Pulmonary neg pulmonary ROS, neg sleep apnea, neg COPD, Patient abstained from smoking.Not current smoker,    Pulmonary exam normal breath sounds clear to auscultation       Cardiovascular Exercise Tolerance: Poor METShypertension, Pt. on home beta blockers and Pt. on medications (-) angina+ CAD, + Cardiac Stents, +CHF and + DOE  (-) Past MI and (-) CABG (-) dysrhythmias + Valvular Problems/Murmurs MR  Rhythm:Regular Rate:Normal + Peripheral Edema TTE 2016: Left ventricle: The cavity size was normal. Systolic function was  normal. The estimated ejection fraction was 60%. Wall motion was  normal; there were no regional wall motion abnormalities. Doppler  parameters are consistent with abnormal left ventricular  relaxation (grade 1 diastolic dysfunction).  - Aortic valve: There was trivial regurgitation. Valve area (Vmax):  1.2 cm^2.  - Mitral valve: There was moderate regurgitation.  - Left atrium: The atrium was mildly dilated.  - Right atrium: The atrium was mildly dilated.  - Atrial septum: No defect or patent foramen ovale was identified.    Neuro/Psych negative neurological ROS  negative psych ROS   GI/Hepatic hiatal hernia, GERD  Controlled,(+) Cirrhosis   Esophageal Varices and ascites    , Had first paracentesis in  recent past; ascites has since reaccumulated, though patient says not to the extent it was at the time of her paracentesis  CT scan: IMPRESSION: 1. Nodular liver contour compatible with cirrhosis. 2. Large volume ascites, similar to prior. 3. Small left pleural effusion. 4. Wall thickening distal esophagus with small hiatal hernia. Esophagitis not excluded. 5. Aortic Atherosclerosis (ICD10-I70.0).   Endo/Other  diabetes, Well Controlled, Type 2, Oral Hypoglycemic AgentsMorbid obesity  Renal/GU CRF  negative genitourinary   Musculoskeletal  (+) Arthritis , Osteoarthritis,    Abdominal (+) - obese, Distended abdomen  Peds  Hematology  (+) Blood dyscrasia, anemia ,   Anesthesia Other Findings Past Medical History: No date: Arthritis     Comment: osteoarthritis No date: CHF (congestive heart failure) (HCC) No date: Coronary artery disease No date: Diabetes mellitus without complication (HCC) No date: Diverticulosis No date: Esophagitis No date: GERD (gastroesophageal reflux disease) No date: High cholesterol No date: Hypercalcemia No date: Hypertension No date: Lumbar radiculopathy No date: Obesity No date: Osteopenia     Comment: Hips No date: Thyroid disease   Reproductive/Obstetrics negative OB ROS                             Anesthesia Physical  Anesthesia Plan  ASA: 4  Anesthesia Plan: General   Post-op Pain Management: Minimal or no pain anticipated   Induction: Intravenous and Rapid sequence  PONV Risk Score and Plan: 3 and Ondansetron, Treatment may vary due to age or medical condition and Dexamethasone  Airway Management Planned: Oral ETT and Video Laryngoscope Planned  Additional Equipment: None  Intra-op Plan:   Post-operative Plan: Extubation  in OR and Possible Post-op intubation/ventilation  Informed Consent: I have reviewed the patients History and Physical, chart, labs and discussed the procedure including the  risks, benefits and alternatives for the proposed anesthesia with the patient or authorized representative who has indicated his/her understanding and acceptance.     Dental Advisory Given and Dental advisory given  Plan Discussed with: Anesthesiologist, CRNA and Surgeon  Anesthesia Plan Comments: (Discussed risks of anesthesia with patient, including PONV, sore throat, lip/dental/eye damage. Rare risks discussed as well, such as cardiorespiratory and neurological sequelae, and allergic reactions. Discussed the role of CRNA in patient's perioperative care. Discussed possible post op intubation and ventilation. Patient understands. )        Anesthesia Quick Evaluation

## 2021-08-13 ENCOUNTER — Inpatient Hospital Stay (HOSPITAL_COMMUNITY): Payer: Medicare Other

## 2021-08-13 ENCOUNTER — Other Ambulatory Visit: Payer: Self-pay

## 2021-08-13 ENCOUNTER — Encounter (HOSPITAL_COMMUNITY): Admission: AD | Disposition: E | Payer: Self-pay | Source: Other Acute Inpatient Hospital | Attending: Internal Medicine

## 2021-08-13 ENCOUNTER — Inpatient Hospital Stay (HOSPITAL_COMMUNITY): Payer: Medicare Other | Admitting: Certified Registered"

## 2021-08-13 ENCOUNTER — Encounter: Payer: Self-pay | Admitting: Gastroenterology

## 2021-08-13 DIAGNOSIS — R579 Shock, unspecified: Secondary | ICD-10-CM | POA: Diagnosis not present

## 2021-08-13 DIAGNOSIS — I8510 Secondary esophageal varices without bleeding: Secondary | ICD-10-CM

## 2021-08-13 DIAGNOSIS — D638 Anemia in other chronic diseases classified elsewhere: Secondary | ICD-10-CM | POA: Diagnosis not present

## 2021-08-13 DIAGNOSIS — K7460 Unspecified cirrhosis of liver: Secondary | ICD-10-CM

## 2021-08-13 DIAGNOSIS — E039 Hypothyroidism, unspecified: Secondary | ICD-10-CM

## 2021-08-13 HISTORY — PX: IR FLUORO GUIDE CV LINE RIGHT: IMG2283

## 2021-08-13 HISTORY — PX: IR US GUIDE VASC ACCESS RIGHT: IMG2390

## 2021-08-13 HISTORY — PX: RADIOLOGY WITH ANESTHESIA: SHX6223

## 2021-08-13 HISTORY — PX: IR INTRAVASCULAR ULTRASOUND NON CORONARY: IMG6085

## 2021-08-13 HISTORY — PX: IR TIPS: IMG2295

## 2021-08-13 HISTORY — PX: IR PARACENTESIS: IMG2679

## 2021-08-13 LAB — COMPREHENSIVE METABOLIC PANEL
ALT: 57 U/L — ABNORMAL HIGH (ref 0–44)
ALT: 53 U/L — ABNORMAL HIGH (ref 0–44)
AST: 141 U/L — ABNORMAL HIGH (ref 15–41)
AST: 121 U/L — ABNORMAL HIGH (ref 15–41)
Albumin: 3.2 g/dL — ABNORMAL LOW (ref 3.5–5.0)
Albumin: 3.5 g/dL (ref 3.5–5.0)
Alkaline Phosphatase: 64 U/L (ref 38–126)
Alkaline Phosphatase: 63 U/L (ref 38–126)
Anion gap: 22 — ABNORMAL HIGH (ref 5–15)
Anion gap: 19 — ABNORMAL HIGH (ref 5–15)
BUN: 96 mg/dL — ABNORMAL HIGH (ref 8–23)
BUN: 99 mg/dL — ABNORMAL HIGH (ref 8–23)
CO2: 12 mmol/L — ABNORMAL LOW (ref 22–32)
CO2: 14 mmol/L — ABNORMAL LOW (ref 22–32)
Calcium: 8 mg/dL — ABNORMAL LOW (ref 8.9–10.3)
Calcium: 7.9 mg/dL — ABNORMAL LOW (ref 8.9–10.3)
Chloride: 101 mmol/L (ref 98–111)
Chloride: 104 mmol/L (ref 98–111)
Creatinine, Ser: 8.31 mg/dL — ABNORMAL HIGH (ref 0.44–1.00)
Creatinine, Ser: 7.95 mg/dL — ABNORMAL HIGH (ref 0.44–1.00)
GFR, Estimated: 5 mL/min — ABNORMAL LOW (ref >60)
GFR, Estimated: 5 mL/min — ABNORMAL LOW (ref >60)
Glucose, Bld: 181 mg/dL — ABNORMAL HIGH (ref 70–99)
Glucose, Bld: 207 mg/dL — ABNORMAL HIGH (ref 70–99)
Potassium: 5 mmol/L (ref 3.5–5.1)
Potassium: 5.9 mmol/L — ABNORMAL HIGH (ref 3.5–5.1)
Sodium: 135 mmol/L (ref 135–145)
Sodium: 137 mmol/L (ref 135–145)
Total Bilirubin: 2.5 mg/dL — ABNORMAL HIGH (ref 0.3–1.2)
Total Bilirubin: 1.8 mg/dL — ABNORMAL HIGH (ref 0.3–1.2)
Total Protein: 6.1 g/dL — ABNORMAL LOW (ref 6.5–8.1)
Total Protein: 6.1 g/dL — ABNORMAL LOW (ref 6.5–8.1)

## 2021-08-13 LAB — CBC
HCT: 24.2 % — ABNORMAL LOW (ref 36.0–46.0)
HCT: 26.6 % — ABNORMAL LOW (ref 36.0–46.0)
Hemoglobin: 8.5 g/dL — ABNORMAL LOW (ref 12.0–15.0)
Hemoglobin: 9.4 g/dL — ABNORMAL LOW (ref 12.0–15.0)
MCH: 32.6 pg (ref 26.0–34.0)
MCH: 32.8 pg (ref 26.0–34.0)
MCHC: 35.1 g/dL (ref 30.0–36.0)
MCHC: 35.3 g/dL (ref 30.0–36.0)
MCV: 92.4 fL (ref 80.0–100.0)
MCV: 93.4 fL (ref 80.0–100.0)
Platelets: 93 10*3/uL — ABNORMAL LOW (ref 150–400)
Platelets: 94 10*3/uL — ABNORMAL LOW (ref 150–400)
RBC: 2.59 MIL/uL — ABNORMAL LOW (ref 3.87–5.11)
RBC: 2.88 MIL/uL — ABNORMAL LOW (ref 3.87–5.11)
RDW: 21.2 % — ABNORMAL HIGH (ref 11.5–15.5)
RDW: 21.2 % — ABNORMAL HIGH (ref 11.5–15.5)
WBC: 15.4 10*3/uL — ABNORMAL HIGH (ref 4.0–10.5)
WBC: 17.9 10*3/uL — ABNORMAL HIGH (ref 4.0–10.5)
nRBC: 0 % (ref 0.0–0.2)
nRBC: 0 % (ref 0.0–0.2)

## 2021-08-13 LAB — PHOSPHORUS: Phosphorus: 6.3 mg/dL — ABNORMAL HIGH (ref 2.5–4.6)

## 2021-08-13 LAB — BASIC METABOLIC PANEL
Anion gap: 15 (ref 5–15)
BUN: 96 mg/dL — ABNORMAL HIGH (ref 8–23)
CO2: 19 mmol/L — ABNORMAL LOW (ref 22–32)
Calcium: 7.7 mg/dL — ABNORMAL LOW (ref 8.9–10.3)
Chloride: 105 mmol/L (ref 98–111)
Creatinine, Ser: 7.4 mg/dL — ABNORMAL HIGH (ref 0.44–1.00)
GFR, Estimated: 5 mL/min — ABNORMAL LOW (ref >60)
Glucose, Bld: 145 mg/dL — ABNORMAL HIGH (ref 70–99)
Potassium: 3.9 mmol/L (ref 3.5–5.1)
Sodium: 139 mmol/L (ref 135–145)

## 2021-08-13 LAB — GLUCOSE, CAPILLARY
Glucose-Capillary: 137 mg/dL — ABNORMAL HIGH (ref 70–99)
Glucose-Capillary: 157 mg/dL — ABNORMAL HIGH (ref 70–99)
Glucose-Capillary: 160 mg/dL — ABNORMAL HIGH (ref 70–99)
Glucose-Capillary: 168 mg/dL — ABNORMAL HIGH (ref 70–99)
Glucose-Capillary: 169 mg/dL — ABNORMAL HIGH (ref 70–99)
Glucose-Capillary: 202 mg/dL — ABNORMAL HIGH (ref 70–99)
Glucose-Capillary: 212 mg/dL — ABNORMAL HIGH (ref 70–99)

## 2021-08-13 LAB — ECHOCARDIOGRAM COMPLETE
AR max vel: 1.63 cm2
AV Peak grad: 13.5 mmHg
Ao pk vel: 1.84 m/s
Area-P 1/2: 3.4 cm2
Calc EF: 66 %
Height: 62 in
S' Lateral: 3.02 cm
Single Plane A2C EF: 76.4 %
Single Plane A4C EF: 57.2 %
Weight: 2730.18 oz

## 2021-08-13 LAB — PROTIME-INR
INR: 1.2 (ref 0.8–1.2)
INR: 1.3 — ABNORMAL HIGH (ref 0.8–1.2)
Prothrombin Time: 15.4 seconds — ABNORMAL HIGH (ref 11.4–15.2)
Prothrombin Time: 16.5 seconds — ABNORMAL HIGH (ref 11.4–15.2)

## 2021-08-13 LAB — TYPE AND SCREEN
ABO/RH(D): B POS
ABO/RH(D): B POS
Antibody Screen: NEGATIVE
Antibody Screen: NEGATIVE

## 2021-08-13 LAB — MAGNESIUM: Magnesium: 1.7 mg/dL (ref 1.7–2.4)

## 2021-08-13 LAB — AMMONIA: Ammonia: 68 umol/L — ABNORMAL HIGH (ref 9–35)

## 2021-08-13 SURGERY — IR WITH ANESTHESIA
Anesthesia: General

## 2021-08-13 MED ORDER — SODIUM BICARBONATE 8.4 % IV SOLN
100.0000 meq | Freq: Once | INTRAVENOUS | Status: AC
Start: 2021-08-13 — End: 2021-08-13
  Administered 2021-08-13: 100 meq via INTRAVENOUS
  Filled 2021-08-13: qty 50

## 2021-08-13 MED ORDER — FENTANYL CITRATE (PF) 250 MCG/5ML IJ SOLN
INTRAMUSCULAR | Status: DC | PRN
Start: 2021-08-13 — End: 2021-08-13
  Administered 2021-08-13 (×2): 50 ug via INTRAVENOUS

## 2021-08-13 MED ORDER — ORAL CARE MOUTH RINSE: 15.0000 mL | OROMUCOSAL | Status: DC | PRN

## 2021-08-13 MED ORDER — PROPOFOL 10 MG/ML IV BOLUS
INTRAVENOUS | Status: DC | PRN
  Administered 2021-08-13: 100 mg via INTRAVENOUS

## 2021-08-13 MED ORDER — LIDOCAINE 2% (20 MG/ML) 5 ML SYRINGE
INTRAMUSCULAR | Status: DC | PRN
  Administered 2021-08-13: 20 mg via INTRAVENOUS

## 2021-08-13 MED ORDER — ROCURONIUM BROMIDE 10 MG/ML (PF) SYRINGE
PREFILLED_SYRINGE | INTRAVENOUS | Status: DC | PRN
  Administered 2021-08-13: 30 mg via INTRAVENOUS

## 2021-08-13 MED ORDER — HEPARIN SODIUM (PORCINE) 1000 UNIT/ML IJ SOLN
INTRAMUSCULAR | Status: AC
  Administered 2021-08-13: 2.6 mL
  Filled 2021-08-13: qty 10

## 2021-08-13 MED ORDER — NEOSTIGMINE METHYLSULFATE 10 MG/10ML IV SOLN
INTRAVENOUS | Status: DC | PRN
  Administered 2021-08-13: 3 mg via INTRAVENOUS

## 2021-08-13 MED ORDER — SUCCINYLCHOLINE CHLORIDE 200 MG/10ML IV SOSY
PREFILLED_SYRINGE | INTRAVENOUS | Status: DC | PRN
  Administered 2021-08-13: 100 mg via INTRAVENOUS

## 2021-08-13 MED ORDER — PHENYLEPHRINE HCL-NACL 20-0.9 MG/250ML-% IV SOLN
INTRAVENOUS | Status: DC | PRN
  Administered 2021-08-13: 80 ug via INTRAVENOUS

## 2021-08-13 MED ORDER — IOHEXOL 300 MG/ML  SOLN
100.0000 mL | Freq: Once | INTRAMUSCULAR | Status: AC | PRN
  Administered 2021-08-13: 45 mL via INTRAVENOUS

## 2021-08-13 MED ORDER — LIDOCAINE HCL 1 % IJ SOLN
INTRAMUSCULAR | Status: AC
  Filled 2021-08-13: qty 40

## 2021-08-13 MED ORDER — GLYCOPYRROLATE 0.2 MG/ML IJ SOLN
INTRAMUSCULAR | Status: DC | PRN
  Administered 2021-08-13: .3 mg via INTRAVENOUS

## 2021-08-13 MED ORDER — NOREPINEPHRINE 16 MG/250ML-% IV SOLN
2.0000 ug/min | INTRAVENOUS | Status: DC
  Administered 2021-08-13: 4 ug/min via INTRAVENOUS
  Filled 2021-08-13: qty 250

## 2021-08-13 MED ORDER — SODIUM ZIRCONIUM CYCLOSILICATE 10 G PO PACK
10.0000 g | PACK | Freq: Once | ORAL | Status: AC
Start: 2021-08-13 — End: 2021-08-13
  Administered 2021-08-13: 10 g via ORAL
  Filled 2021-08-13: qty 1

## 2021-08-13 MED ORDER — SODIUM BICARBONATE 650 MG PO TABS
1300.0000 mg | ORAL_TABLET | Freq: Two times a day (BID) | ORAL | Status: DC
  Administered 2021-08-13 (×2): 1300 mg via ORAL
  Filled 2021-08-13 (×2): qty 2

## 2021-08-13 MED ORDER — LIDOCAINE-EPINEPHRINE 1 %-1:100000 IJ SOLN
INTRAMUSCULAR | Status: AC
  Filled 2021-08-13: qty 2

## 2021-08-13 MED ORDER — FUROSEMIDE 10 MG/ML IJ SOLN
40.0000 mg | Freq: Once | INTRAMUSCULAR | Status: AC
  Administered 2021-08-13: 40 mg via INTRAVENOUS
  Filled 2021-08-13: qty 4

## 2021-08-13 MED ORDER — INSULIN ASPART 100 UNIT/ML IJ SOLN
0.0000 [IU] | INTRAMUSCULAR | Status: DC
  Administered 2021-08-13: 3 [IU] via SUBCUTANEOUS
  Administered 2021-08-13 – 2021-08-14 (×3): 2 [IU] via SUBCUTANEOUS
  Administered 2021-08-14: 1 [IU] via SUBCUTANEOUS
  Administered 2021-08-14: 2 [IU] via SUBCUTANEOUS
  Administered 2021-08-14: 1 [IU] via SUBCUTANEOUS
  Administered 2021-08-14 (×2): 2 [IU] via SUBCUTANEOUS
  Administered 2021-08-14 – 2021-08-15 (×2): 1 [IU] via SUBCUTANEOUS

## 2021-08-13 NOTE — Sedation Documentation (Signed)
Patient transported to PACU room 17 with CRNA. Dressing clean, dry, and intact. Bedside report given to Jeannett Senior, RN in PACU.

## 2021-08-13 NOTE — Anesthesia Preprocedure Evaluation (Addendum)
Anesthesia Evaluation  Patient identified by MRN, date of birth, ID band Patient awake    Reviewed: Allergy & Precautions, NPO status , Patient's Chart, lab work & pertinent test results  History of Anesthesia Complications Negative for: history of anesthetic complications  Airway Mallampati: III  TM Distance: >3 FB Neck ROM: Full    Dental  (+) Dental Advisory Given   Pulmonary neg pulmonary ROS,    Pulmonary exam normal        Cardiovascular hypertension, + CAD and + Cardiac Stents  Normal cardiovascular exam+ dysrhythmias Atrial Fibrillation    '23 TTE - EF 55 to 60%. There is mild concentric left ventricular hypertrophy. Grade I diastolic dysfunction (impaired relaxation). Mild mitral valve regurgitation. Tricuspid valve regurgitation is mild to moderate.     Neuro/Psych negative neurological ROS  negative psych ROS   GI/Hepatic hiatal hernia, GERD  ,(+) Cirrhosis       ,   Endo/Other  diabetes, Type 2, Oral Hypoglycemic AgentsHypothyroidism  Obesity   Renal/GU ARF and CRFRenal disease     Musculoskeletal  (+) Arthritis ,   Abdominal   Peds  Hematology  (+) Blood dyscrasia, anemia ,  Plt 94k    Anesthesia Other Findings   Reproductive/Obstetrics                            Anesthesia Physical Anesthesia Plan  ASA: 4  Anesthesia Plan: General   Post-op Pain Management: Minimal or no pain anticipated   Induction: Intravenous  PONV Risk Score and Plan: 3 and Treatment may vary due to age or medical condition, Ondansetron and Dexamethasone  Airway Management Planned: Oral ETT  Additional Equipment: Arterial line  Intra-op Plan:   Post-operative Plan: Extubation in OR  Informed Consent: I have reviewed the patients History and Physical, chart, labs and discussed the procedure including the risks, benefits and alternatives for the proposed anesthesia with the patient or  authorized representative who has indicated his/her understanding and acceptance.     Dental advisory given  Plan Discussed with: CRNA, Anesthesiologist and Surgeon  Anesthesia Plan Comments: (Discussion had with ICU physician and nephrologist regarding patient's renal failure. See their notes.)      Anesthesia Quick Evaluation

## 2021-08-13 NOTE — Progress Notes (Addendum)
eLink Physician-Brief Progress Note Patient Name: TAKARIA DUBROC DOB: 1950/04/04 MRN: [id number]   Date of Service  2021-09-07  HPI/Events of Note  Patient c/o chest pain and increasing O2 requirements. Sat now = 91% on 12 L/min [place] O2. EKG reveals normal sinus rhythm, left axis deviationand  left bundle branch block. Sat now = 99% and BP = 130/40 with MAP = 60. Creatinine = 7.40. CXR earlier reveals increased patchy airspace disease suspicous for pneumonia.   eICU Interventions  Plan: Cycle Troponin.  Lasix 40 mg IV X 1 now.  Will request PCCM ground team to evaluate the patient at bedside.      Intervention Category Major Interventions: Other:;Hypoxemia - evaluation and management  Zi Sek Dennard Nip 2021/09/07, 11:29 PM

## 2021-08-13 NOTE — Consult Note (Signed)
Nephrology Consult   Requesting provider: [name] Service requesting consult: CCM Reason for consult: AKI on CKD 3b   Assessment/Recommendations: Erica Larsen is a/an 71 y.o. female with a past medical history Afib, CKD 3b, cirrhosis 2/2 NASH who present w/ Variceal bleeding and AKI   Oliguric AKI on CKD 3b: Baseline creatinine around 1.6 now with creatinine of 8.3.  Urine with no protein but does have some hematuria as well as pyuria.  Infection could be considered.  Active glomerular disease less likely given the protein was negative however IgA could be considered in the setting of cirrhosis  Either way her AKI at this time is most likely HRS initially but then likely progressed to the point of ATN.  Fortunately she does not appear significantly uremic.  TIPS would be a good treatment for underlying HRS.  Because she is making some urine I am hopeful that she will have renal recovery although because of her CKD and significant renal dysfunction I think she will need dialysis within the next 24 hours -I think it is in her best interest from a bleeding and AKI perspective to undergo TIPS today.  Understand the risk associated with that.  We will assess for dialysis after the procedure.  Hopefully wait till tomorrow.  Likely will need CRRT -IR will place temporary dialysis catheter; appreicate help -Continue to monitor daily Cr, Dose meds for GFR -Monitor Daily I/Os, Daily weight  -Maintain MAP>65 for optimal renal perfusion.  -Avoid nephrotoxic medications including NSAIDs -Use synthetic opioids (Fentanyl/Dilaudid) if needed -Can continue current medications for possible HRS  Volume Status: slightly volume overloaded.  No diuretics at this time  Variceal bleeding: Status post banding.  Transfuse as needed  Mixed anion gap and non-anion gap metabolic acidosis: Likely associated with diarrhea and AKI.  IV and oral supplemental bicarb today.  Possible CRRT as above  Hyperkalemia:  Potassium up to 5.9 at noon.  Likely related to AKI and resorption of blood from the GI tract.  Lokelma 10 g x 1.  Possible CRRT as above.  Hypocalcemia: Likely blood transfusions of played some role.  Continue to monitor.  Cirrhosis: TIPS planned for today.  GI following  Shock: Borderline Bps at this time.  Using norepinephrine mainly from HRS perspective.  Agree with current plan   Recommendations conveyed to primary service.    [name] 2021-09-06 1:19 PM   _____________________________________________________________________________________ CC: AKI on CKD 3b  History of Present Illness: Erica Larsen is a/an 71 y.o. female with a past medical history of Afib, CKD 3b, cirrhosis 2/2 NASH who presents with severe AKI and GI bleeding.  The patient presented to  on around 6/25 because of blood in her stool.  In the emergency department she was found to be significantly hypotensive.  Because she was on blood thinner she was given medications to attempt reversal of her bleeding.  She was also given octreotide and Protonix infusions.  Labs were also significant for a creatinine in the eights up from her baseline of around 1.6.  She underwent EGD on 6/25 that found varices as well as blood and she underwent banding of the varices.  She was intubated for the procedure but was able to be extubated postop.  It was recommended that she be transferred to [facility] for TIPS.  Also been concerned about her overall trajectory and it was recommended that palliative care be involved.  After arrival little change.  The patient's creatinine was  again 8.3 this morning.  She has an acidosis with a bicarb of 12.  Albumin is 3.2.  Hemoglobin has declined some down to 8.5 today.  Patient states she feels okay.  Denies significant shortness of breath and chest pain.  She does have some nausea but no vomiting recently.  She has chronic diarrhea and states that it was  worse before she came to the hospital.   Medications:  Current Facility-Administered Medications  Medication Dose Route Frequency Provider Last Rate Last Admin   0.9 %  sodium chloride infusion  250 mL Intravenous Continuous [name], MD   Held at 08/12/21 2159   albumin human 25 % solution 75 g  75 g Intravenous Daily [name], MD 50 mL/hr at 08/13/21 1100 Infusion Verify at 08/13/21 1100   cefTRIAXone (ROCEPHIN) 2 g in sodium chloride 0.9 % 100 mL IVPB  2 g Intravenous Q24H [name], MD   Stopped at 08/13/21 0044   Chlorhexidine Gluconate Cloth 2 % PADS 6 each  6 each Topical Q0600 [name], MD   6 each at 08/12/21 2040   docusate sodium (COLACE) capsule 100 mg  100 mg Oral BID PRN [name], MD       insulin aspart (novoLOG) injection 0-9 Units  0-9 Units Subcutaneous Q4H Martina Sinner, MD   3 Units at 08/13/21 1159   norepinephrine (LEVOPHED) 4mg  in (0.016 mg/mL) premix infusion  2-10 mcg/min Intravenous Titrated [name], MD 18.75 mL/hr at 08/13/21 1100 5 mcg/min at 08/13/21 1100   octreotide (SANDOSTATIN) 500 mcg in sodium chloride 0.9 % 250 mL (2 mcg/mL) infusion  50 mcg/hr Intravenous Continuous [name], MD 25 mL/hr at 08/13/21 1100 50 mcg/hr at 08/13/21 1100   ondansetron (ZOFRAN) injection 4 mg  4 mg Intravenous Q6H PRN [name], MD   4 mg at 08/13/21 0759   Oral care mouth rinse  15 mL Mouth Rinse PRN [name], MD       pantoprozole (PROTONIX) 80 mg /NS 100 mL infusion  8 mg/hr Intravenous Continuous [name], MD 10 mL/hr at 08/13/21 1228 8 mg/hr at 08/13/21 1228   polyethylene glycol (MIRALAX / GLYCOLAX) packet 17 g  17 g Oral Daily PRN [name], MD       sodium bicarbonate injection 100 mEq  100 mEq Intravenous Once [name], MD       sodium bicarbonate tablet 1,300 mg  1,300 mg Oral BID [name], MD         ALLERGIES Sulfa antibiotics and  Latex  MEDICAL HISTORY Past Medical History:  Diagnosis Date   A-fib [facility]    Anemia of chronic disease 2017   Chronic diastolic HF (heart failure) (HCC)    CKD (chronic kidney disease), stage IV (HCC)    Coronary artery disease    a.) PCI on 12/05/2006 - 80% stenosis mLAD and 70% stenosis RCA; Xience V 2.75 x 23 mm DES to mLAD    COVID-19 virus detected 08/14/2018   Diverticulosis    Esophagitis    GERD (gastroesophageal reflux disease)    Grade III diastolic dysfunction    Hiatal hernia    High cholesterol    Hypercalcemia    Hypertension    Hypothyroidism    LBBB (left bundle branch block)    Lumbar radiculopathy    Obesity    Osteoarthritis    Osteopenia  Hips   PAH (pulmonary artery hypertension) (HCC)    mild per 07/14/2018 TTE   Pneumonia    T2DM (type 2 diabetes mellitus) (HCC)    Valvular insufficiency    a. MODERATE PV, TV, MV regurgitation on 07/14/2018 TTE     SOCIAL HISTORY Social History   Socioeconomic History   Marital status: Married    Spouse name: [name]   Number of children: 2   Years of education: Not on file   Highest education level: Not on file  Occupational History   Occupation: retired  Tobacco Use   Smoking status: Never   Smokeless tobacco: Never  Vaping Use   Vaping Use: Never used  Substance and Sexual Activity   Alcohol use: Not Currently    Comment: occassional   Drug use: No   Sexual activity: Yes    Birth control/protection: Post-menopausal  Other Topics Concern   Not on file  Social History Narrative   Not on file   Social Determinants of Health   Financial Resource Strain: Low Risk  (07/30/2021)   Overall Financial Resource Strain (CARDIA)    Difficulty of Paying Living Expenses: Not hard at all  Food Insecurity: No Food Insecurity (07/30/2021)   Hunger Vital Sign    Worried About Running Out of Food in the Last Year: Never true    Ran Out of Food in the Last Year: Never true  Transportation Needs: No  Transportation Needs (07/30/2021)   PRAPARE - Administrator, Civil Service (Medical): No    Lack of Transportation (Non-Medical): No  Physical Activity: Inactive (07/30/2021)   Exercise Vital Sign    Days of Exercise per Week: 0 days    Minutes of Exercise per Session: 0 min  Stress: No Stress Concern Present (07/30/2021)   [employer] of Occupational Health - Occupational Stress Questionnaire    Feeling of Stress : Not at all  Social Connections: Moderately Isolated (07/30/2021)   Social Connection and Isolation Panel [NHANES]    Frequency of Communication with Friends and Family: Never    Frequency of Social Gatherings with Friends and Family: Three times a week    Attends Religious Services: Never    Active Member of Clubs or Organizations: No    Attends Banker Meetings: Never    Marital Status: Married  Catering manager Violence: Not At Risk (07/30/2021)   Humiliation, Afraid, Rape, and Kick questionnaire    Fear of Current or Ex-Partner: No    Emotionally Abused: No    Physically Abused: No    Sexually Abused: No     FAMILY HISTORY Family History  Problem Relation Age of Onset   Heart disease Mother    Kidney failure Mother    Hypertension Mother    Diabetes Mother    Heart attack Father    Hypertension Father    Thyroid disease Father    Hypertension Sister    Diabetes Sister    Thyroid disease Daughter    Thyroid disease Daughter    Breast cancer Paternal Aunt       Review of Systems: 12 systems reviewed Otherwise as per HPI, all other systems reviewed and negative  Physical Exam: Vitals:   08/13/21 1309 08/13/21 1310  BP:    Pulse: 89   Resp: 18 (!) 22  Temp:    SpO2: 94%    Total I/O In: 293.4 [I.V.:267.9; IV Piggyback:25.5] Out: 175 [Urine:175]  Intake/Output Summary (Last 24 hours) at 08/13/2021 1319 Last  data filed at 08-27-21 1228 Gross per 24 hour  Intake 740.15 ml  Output 355 ml  Net 385.15 ml   General:  well-appearing, no acute distress HEENT: anicteric sclera, oropharynx clear without lesions CV: regular rate, no murmurs, 1+ edema in the bilateral lower extremities Lungs: clear to auscultation bilaterally, normal work of breathing Abd: soft, non-tender, mild to moderate distention Skin: no visible lesions or rashes Psych: alert, engaged, appropriate mood and affect Musculoskeletal: no obvious deformities Neuro: normal speech, no gross focal deficits   Test Results Reviewed Lab Results  Component Value Date   NA 137 08-27-2021   K 5.9 (H) 2021/08/27   CL 104 27-Aug-2021   CO2 14 (L) 08/27/21   BUN 99 (H) 08/27/21   CREATININE 7.95 (H) Aug 27, 2021   CALCIUM 7.9 (L) 08/27/2021   ALBUMIN 3.5 2021-08-27   PHOS 6.3 (H) 08/12/2021    CBC Recent Labs  Lab 08/12/21 0518 08/12/21 1008 08/12/21 1642 08/12/21 2357 27-Aug-2021 1202  WBC 15.2*  --   --  15.4* 17.9*  NEUTROABS 11.3*  --   --   --   --   HGB 11.1*   < > 10.0* 9.4* 8.5*  HCT 31.6*   < > 29.0* 26.6* 24.2*  MCV 89.8  --   --  92.4 93.4  PLT 100*  --   --  93* 94*   < > = values in this interval not displayed.    I have reviewed all relevant outside healthcare records related to the patient's current hospitalization

## 2021-08-13 NOTE — Consult Note (Signed)
Chief Complaint: Patient was seen in consultation today for variceal bleeding  Referring Physician(s): CCM  Supervising Physician: Marliss Coots  Patient Status: [facility] - In-pt  History of Present Illness: Erica Larsen is a 71 y.o. female with a past medical history significant for anemia, HTN, HLD, CHF, a.fib, LBBB, pulmonary HTN, CKD IV, GERD, DM, decompensated NASH cirrhosis who presented to [facility] ED Aug 14, 2021 with hematemesis. She also reported blood in her stool that started on 08/10/21. She was noted to be hypotensive with hgb 11, INR 1.3 and creatinine 8.64 (baseline ~1.5). She was admitted to the critical care service with GI and nephrology consulting. 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 She underwent banding x 4 with incomplete eradication of varices. She has been transferred to [facility] service at [facility] for possible urgent TIPS placement.   Patient seen in ICU with Dr. Elby Showers, husband and daughter at bedside during discussion. [name] reports ongoing nausea without recent vomiting as well as gas pain and diarrhea which has been present "for awhile" per her daughter. She has not seen any further blood in her bowel movements since arriving to [facility]. She was diagnosed with cirrhosis earlier this year and has had 1 paracentesis 07/26/21 which yielded 5 L (requested maximum per order). She has never been diagnosed with hepatitis that she is aware of, she denies heavy ETOH use or unexplained confusion. She does not wear supplemental oxygen at home but does endorse becoiminHer daughter is wondering if cirrhosis is related to diet and is asking if a dietitian can come see her while she is here.  Extensive discussion today by Dr. Elby Showers including review of the pathology of cirrhosis  including how this leads to ascites/esophageal varices and how TIPS can potentially help. Also discussed were the risks during TIPS procedure to include bleeding, damage to liver and adjacent structures. Reviewed risks post procedure to include continued esophageal bleeding, liver failure, renal failure, encephalopathy as well as that these outcomes are difficult to predict prior to procedure. After thorough discussion patient and her family are agreeable to proceed with TIPS today.   Past Medical History:  Diagnosis Date   A-fib (HCC)    Anemia of chronic disease 2017   Chronic diastolic HF (heart failure) (HCC)    CKD (chronic kidney disease), stage IV (HCC)    Coronary artery disease    a.) PCI on 12/05/2006 - 80% stenosis mLAD and 70% stenosis RCA; Xience V 2.75 x 23 mm DES to mLAD    COVID-19 virus detected 08/14/2018   Diverticulosis    Esophagitis    GERD (gastroesophageal reflux disease)    Grade III diastolic dysfunction    Hiatal hernia    High cholesterol    Hypercalcemia    Hypertension    Hypothyroidism    LBBB (left bundle branch block)    Lumbar radiculopathy    Obesity    Osteoarthritis    Osteopenia    Hips   PAH (pulmonary artery hypertension) (HCC)    mild per 07/14/2018 TTE   Pneumonia    T2DM (type 2 diabetes mellitus) (HCC)    Valvular insufficiency    a. MODERATE PV, TV, MV regurgitation on 07/14/2018 TTE    Past Surgical History:  Procedure Laterality Date   CATARACT  EXTRACTION Bilateral    May - June 2018   COLONOSCOPY WITH PROPOFOL N/A 08/01/2015   Procedure: COLONOSCOPY WITH PROPOFOL;  Surgeon: Midge Minium, MD;  Location: [facility] ENDOSCOPY;  Service: Endoscopy;  Laterality: N/A;   COLONOSCOPY WITH PROPOFOL N/A 06/27/2021   Procedure: COLONOSCOPY WITH PROPOFOL;  Surgeon: Toney Reil, MD;  Location: [facility] ENDOSCOPY;  Service: Gastroenterology;  Laterality: N/A;   CORONARY ANGIOPLASTY WITH STENT PLACEMENT  12/05/2006   80% stenosis mLAD and 70%  stenosis RCA; Xience V 2.75 x 23 mm DES to mLAD    ESOPHAGOGASTRODUODENOSCOPY (EGD) WITH PROPOFOL N/A 05/14/2016   Procedure: ESOPHAGOGASTRODUODENOSCOPY (EGD) WITH PROPOFOL;  Surgeon: Midge Minium, MD;  Location: [facility] ENDOSCOPY;  Service: Endoscopy;  Laterality: N/A;   ESOPHAGOGASTRODUODENOSCOPY (EGD) WITH PROPOFOL N/A 06/27/2021   Procedure: ESOPHAGOGASTRODUODENOSCOPY (EGD) WITH PROPOFOL;  Surgeon: Toney Reil, MD;  Location: [facility] ENDOSCOPY;  Service: Gastroenterology;  Laterality: N/A;   EYE SURGERY     GIVENS CAPSULE STUDY N/A 06/01/2015   Procedure: GIVENS CAPSULE STUDY;  Surgeon: Midge Minium, MD;  Location: [facility] ENDOSCOPY;  Service: Endoscopy;  Laterality: N/A;   HIATAL HERNIA REPAIR N/A 09/24/2016   Procedure: LAPAROSCOPIC REPAIR OF HIATAL HERNIA WITH FUNDOPLICATION WITH MESH (BIO-A);  Surgeon: Leafy Ro, MD;  Location: [facility] ORS;  Service: General;  Laterality: N/A;  Lithotomy position w arms tucked.    IR RADIOLOGIST EVAL & MGMT  07/12/2021   pre cancer removed     left forearm   squamous cell carcinoma surgery     back, forehead, left forearm   WART FULGURATION N/A 09/22/2020   Procedure: FULGURATION ANAL WART;  Surgeon: Earline Mayotte, MD;  Location: [facility] ORS;  Service: General;  Laterality: N/A;    Allergies: Sulfa antibiotics and Latex  Medications: Prior to Admission medications   Not on File     Family History  Problem Relation Age of Onset   Heart disease Mother    Kidney failure Mother    Hypertension Mother    Diabetes Mother    Heart attack Father    Hypertension Father    Thyroid disease Father    Hypertension Sister    Diabetes Sister    Thyroid disease Daughter    Thyroid disease Daughter    Breast cancer Paternal Aunt     Social History   Socioeconomic History   Marital status: Married    Spouse name: [name]   Number of children: 2   Years of education: Not on file   Highest education level: Not on file  Occupational History   Occupation:  retired  Tobacco Use   Smoking status: Never   Smokeless tobacco: Never  Vaping Use   Vaping Use: Never used  Substance and Sexual Activity   Alcohol use: Not Currently    Comment: occassional   Drug use: No   Sexual activity: Yes    Birth control/protection: Post-menopausal  Other Topics Concern   Not on file  Social History Narrative   Not on file   Social Determinants of Health   Financial Resource Strain: Low Risk  (07/30/2021)   Overall Financial Resource Strain (CARDIA)    Difficulty of Paying Living Expenses: Not hard at all  Food Insecurity: No Food Insecurity (07/30/2021)   Hunger Vital Sign    Worried About Running Out of Food in the Last Year: Never true    Ran Out of Food in the Last Year: Never true  Transportation Needs: No Transportation Needs (07/30/2021)   PRAPARE -  Administrator, Civil Service (Medical): No    Lack of Transportation (Non-Medical): No  Physical Activity: Inactive (07/30/2021)   Exercise Vital Sign    Days of Exercise per Week: 0 days    Minutes of Exercise per Session: 0 min  Stress: No Stress Concern Present (07/30/2021)   [employer] of Occupational Health - Occupational Stress Questionnaire    Feeling of Stress : Not at all  Social Connections: Moderately Isolated (07/30/2021)   Social Connection and Isolation Panel [NHANES]    Frequency of Communication with Friends and Family: Never    Frequency of Social Gatherings with Friends and Family: Three times a week    Attends Religious Services: Never    Active Member of Clubs or Organizations: No    Attends Banker Meetings: Never    Marital Status: Married     Review of Systems: A 12 point ROS discussed and pertinent positives are indicated in the HPI above.  All other systems are negative.  Review of Systems  Constitutional:  Negative for chills and fever.  Respiratory:  Negative for cough and shortness of breath.   Cardiovascular:  Negative for chest  pain and leg swelling.  Gastrointestinal:  Positive for abdominal distention ("gas pains"), abdominal pain, diarrhea and nausea. Negative for blood in stool and vomiting.  Genitourinary:  Negative for dysuria and hematuria.  Skin:  Negative for rash and wound.  Neurological:  Negative for dizziness, syncope, weakness, light-headedness, numbness and headaches.  Psychiatric/Behavioral:  Negative for confusion.     Vital Signs: BP (!) 132/55   Pulse 86   Temp (!) 97.5 F (36.4 C) (Oral)   Resp 18   Wt 181 lb 7 oz (82.3 kg)   SpO2 98%   BMI 33.19 kg/m   Physical Exam Vitals and nursing note reviewed.  Constitutional:      General: She is not in acute distress.    Appearance: She is ill-appearing.  HENT:     Head: Normocephalic.     Mouth/Throat:     Mouth: Mucous membranes are moist.     Pharynx: Oropharynx is clear. No oropharyngeal exudate or posterior oropharyngeal erythema.  Cardiovascular:     Rate and Rhythm: Normal rate and regular rhythm.  Pulmonary:     Effort: Pulmonary effort is normal.     Breath sounds: Normal breath sounds.     Comments: (+) 2L supplemental oxygen via [place] Abdominal:     General: There is distension.     Palpations: Abdomen is soft.     Tenderness: There is no abdominal tenderness.  Musculoskeletal:     Right lower leg: No edema.     Left lower leg: No edema.  Skin:    General: Skin is warm and dry.     Findings: Bruising (BUE) present.  Neurological:     Mental Status: She is alert and oriented to person, place, and time.  Psychiatric:        Mood and Affect: Mood normal.        Behavior: Behavior normal.        Thought Content: Thought content normal.        Judgment: Judgment normal.      MD Evaluation Airway: WNL Heart: WNL Abdomen: WNL Chest/ Lungs: WNL ASA  Classification: Per MD or Designee   Imaging: CT ABDOMEN PELVIS WO CONTRAST  Result Date: 20-Aug-2021 CLINICAL DATA:  Persistent nausea and vomiting. History of  cirrhosis. EXAM: CT ABDOMEN AND PELVIS  WITHOUT CONTRAST TECHNIQUE: Multidetector CT imaging of the abdomen and pelvis was performed following the standard protocol without IV contrast. RADIATION DOSE REDUCTION: This exam was performed according to the departmental dose-optimization program which includes automated exposure control, adjustment of the mA and/or kV according to patient size and/or use of iterative reconstruction technique. COMPARISON:  07/23/2021 FINDINGS: Lower chest: Small left pleural effusion. Wall thickening noted distal esophagus with small hiatal hernia. Hepatobiliary: Nodular liver contour compatible with cirrhosis. No suspicious focal abnormality in the liver on this study without intravenous contrast. There is no evidence for gallstones, gallbladder wall thickening, or pericholecystic fluid. No intrahepatic or extrahepatic biliary dilation. Pancreas: No focal mass lesion. No dilatation of the main duct. No intraparenchymal cyst. No peripancreatic edema. Spleen: No splenomegaly. No focal mass lesion. Adrenals/Urinary Tract: No adrenal nodule or mass. Kidneys unremarkable. No evidence for hydroureter. Bladder is decompressed. Stomach/Bowel: Small hiatal hernia. Food and fluid noted in the stomach. Duodenum is normally positioned as is the ligament of Treitz. Probable hemostatic clips in the second segment of the duodenum. No small bowel wall thickening. No small bowel dilatation. The terminal ileum is normal. The appendix is not well visualized, but there is no edema or inflammation in the region of the cecum. No gross colonic mass. No colonic wall thickening. Vascular/Lymphatic: There is mild atherosclerotic calcification of the abdominal aorta without aneurysm. There is no gastrohepatic or hepatoduodenal ligament lymphadenopathy. No retroperitoneal or mesenteric lymphadenopathy. No pelvic sidewall lymphadenopathy. Reproductive: Unremarkable. Other: Large volume ascites evident.  Musculoskeletal: No worrisome lytic or sclerotic osseous abnormality. IMPRESSION: 1. Nodular liver contour compatible with cirrhosis. 2. Large volume ascites, similar to prior. 3. Small left pleural effusion. 4. Wall thickening distal esophagus with small hiatal hernia. Esophagitis not excluded. 5. Aortic Atherosclerosis (ICD10-I70.0). Electronically Signed   By: Kennith Center M.D.   On: 08/12/2021 06:50   US Paracentesis  Result Date: 07/26/2021 INDICATION: Suspected cirrhosis with ascites request received for diagnostic and therapeutic paracentesis up to 5 L maximum. EXAM: ULTRASOUND GUIDED  PARACENTESIS MEDICATIONS: Local 1% lidocaine only. COMPLICATIONS: None immediate. PROCEDURE: Informed written consent was obtained from the patient after a discussion of the risks, benefits and alternatives to treatment. A timeout was performed prior to the initiation of the procedure. Initial ultrasound scanning demonstrates a large amount of ascites within the right upper abdominal quadrant. The right upper abdomen was prepped and draped in the usual sterile fashion. 1% lidocaine was used for local anesthesia. Following this, a 19 gauge, 7-cm, Yueh catheter was introduced. An ultrasound image was saved for documentation purposes. The paracentesis was performed. The catheter was removed and a dressing was applied. The patient tolerated the procedure well without immediate post procedural complication. Patient received post-procedure intravenous albumin; see nursing notes for details. FINDINGS: A total of approximately 5 L of clear yellow fluid was removed. Samples were sent to the laboratory as requested by the clinical team. IMPRESSION: Successful ultrasound-guided paracentesis yielding 5 liters of peritoneal fluid. This exam was performed by Pattricia Boss PA-C, and was supervised and interpreted by Dr. Milford Cage. PLAN: The patient has previously been formally evaluated by the [facility] Interventional Radiology [facility]  Hypertension [facility] and is being actively followed for potential future intervention. Roanna Banning, MD [facility] Radiology Electronically Signed   By: Roanna Banning M.D.   On: 07/26/2021 16:59   CT ABDOMEN PELVIS WO CONTRAST  Result Date: 07/24/2021 CLINICAL DATA:  Generalized abdominal and bloating for 1 month, known cirrhosis liver ascites.  EXAM: CT ABDOMEN AND PELVIS WITHOUT CONTRAST TECHNIQUE: Multidetector CT imaging of the abdomen and pelvis was performed following the standard protocol without IV contrast. RADIATION DOSE REDUCTION: This exam was performed according to the departmental dose-optimization program which includes automated exposure control, adjustment of the mA and/or kV according to patient size and/or use of iterative reconstruction technique. COMPARISON:  August 09, 2016 FINDINGS: Lower chest: Small left pleural effusion is identified. The visualized lung bases are otherwise unremarkable. The heart size is enlarged. Hepatobiliary: Nodular contour of the liver is identified without definite focal discrete liver lesion on this noncontrast exam. The gallbladder is normal. The biliary tree is normal. Pancreas: Unremarkable. No pancreatic ductal dilatation or surrounding inflammatory changes. Spleen: Normal in size without focal abnormality. Adrenals/Urinary Tract: Adrenal glands are unremarkable. Kidneys are normal, without renal calculi, focal lesion, or hydronephrosis. The bladder is decompressed, limiting evaluation. Stomach/Bowel: There is a moderate hiatal hernia. The stomach is otherwise partially decompressed without gross abnormality. There is decompressed colon with generalized bowel wall thickening of the colon probably secondary to adjacent ascites. Minimal prominent small bowel loops are identified in the lower abdomen. This is probably due to ileus. Diverticulosis of the colon is identified. Vascular/Lymphatic: Aortic atherosclerosis.  No enlarged abdominal or pelvic lymph nodes. Reproductive: Uterus and bilateral adnexa are unremarkable. Other: Marked ascites in the abdomen and pelvis. Musculoskeletal: Degenerative joint changes of the spine are noted. IMPRESSION: Cirrhosis of liver with marked ascites of abdomen and pelvis. Minimal prominent small bowel loops in the lower abdomen probably due to ileus. Decompressed colon with generalized bowel wall thickening of the colon probably secondary to adjacent ascites. Small left pleural effusion. Electronically Signed   By: Sherian Rein M.D.   On: 07/24/2021 12:03    Labs:  CBC: Recent Labs    02/21/21 1124 06/20/21 1130 08/12/21 0518 08/12/21 1008 08/12/21 1304 08/12/21 1642 08/12/21 2357  WBC 7.5 10.9* 15.2*  --   --   --  15.4*  HGB 9.5* 12.8 11.1* 10.3* 9.5* 10.0* 9.4*  HCT 31.1* 38.0 31.6* 29.9* 27.7* 29.0* 26.6*  PLT 150 134* 100*  --   --   --  93*    COAGS: Recent Labs    08/12/21 0518 08/12/21 1008 08/12/21 2357  INR 1.3*  --  1.2  APTT  --  31  --     BMP: Recent Labs    02/21/21 1124 05/11/21 1320 06/20/21 1130 08/12/21 0518 08/12/21 2357  NA 137 139 140 132* 135  K 3.6 4.0 3.6 4.4 5.0  CL 105 105 103 102 101  CO2 25 22 28  14* 12*  GLUCOSE 138* 99 104* 148* 181*  BUN 14 10 22  103* 96*  CALCIUM 8.4* 8.9 8.6* 8.3* 8.0*  CREATININE 1.58* 1.47* 2.03* 8.64* 8.31*  GFRNONAA 35*  --  25* 4* 5*    LIVER FUNCTION TESTS: Recent Labs    08/14/20 1354 05/11/21 1320 08/12/21 0518 08/12/21 2357  BILITOT 0.7 1.0 2.0* 2.5*  AST 54* 55* 71* 141*  ALT 36 32 36 57*  ALKPHOS 80 93 75 64  PROT 7.7 6.9 6.0* 6.1*  ALBUMIN 4.1 3.5* 2.7* 3.2*    TUMOR MARKERS: No results for input(s): "AFPTM", "CEA", "CA199", "CHROMGRNA" in the last 8760 hours.  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 She has been transferred to [facility] for TIPS consideration in IR.  Extensive discussion with patient and family today by Dr. Elby Showers - reviewed etiology of cirrhosis and how this relates to ascites, gastric varices and portal HTN. Discussed TIPS procedure indications, risks, benefits and alternatives - specifically focused on her increased MELD score which can portend poorer outcomes post procedure, however if TIPS were not undertaken at this time she will likely continue to bleed which could potentially be life threatening as the varices were unable to be eradicated via endoscopic banding. Patient and her family understand the procedure as well as the risks and are agreeable to proceed.   Plan for TIPS, possible BRTO, possible paracentesis today in IR with general anesthesia - tentatively planned for 1-2 pm.  MELD-Na 27 (27-32% 90 day mortality) - based on labs from 6/25, labs from today pending Child-Pugh class B, 9 points - based on labs from 6/25, labs from today pending.    Echo performed at [facility] yesterday showing EF 55-60%, normal RV function, mild MV/TV regurgitation. Otherwise essentially unremarkable.     Unable to undergo CT with contrast due to ARF. CT abd/pelvis w/o contrast yesterday reviewed by Dr. Elby Showers  today.  Risks and benefits of paracentesis, TIPS, BRTO and/or additional variceal embolization were discussed with the patient and/or the patient's family including, but not limited to, infection, bleeding, damage to adjacent structures, worsening hepatic and/or cardiac function, worsening and/or the development of altered mental status/encephalopathy, non-target embolization and death.   This interventional procedure involves the use of X-rays and because of the nature of the planned procedure, it is possible that we will have prolonged use of X-ray fluoroscopy.  Potential  radiation risks to you include (but are not limited to) the following: - A slightly elevated risk for cancer  several years later in life. This risk is typically less than 0.5% percent. This risk is low in comparison to the normal incidence of human cancer, which is 33% for women and 50% for men according to the American Cancer Society. - Radiation induced injury can include skin redness, resembling a rash, tissue breakdown / ulcers and hair loss (which can be temporary or permanent).   The likelihood of either of these occurring depends on the difficulty of the procedure and whether you are sensitive to radiation due to previous procedures, disease, or genetic conditions.   IF your procedure requires a prolonged use of radiation, you will be notified and given written instructions for further action.  It is your responsibility to monitor the irradiated area for the 2 weeks following the procedure and to notify your physician if you are concerned that you have suffered a radiation induced injury.    All of the patient's questions were answered, patient is agreeable to proceed.  Consent signed and in chart.   Thank you for this interesting consult.  I greatly enjoyed meeting Ewaoluwa Hollister Sleight and look forward to participating in their care.  A copy of this report was sent to the requesting provider on this date.  Electronically Signed: Villa Herb, PA-C 08/13/2021, 9:26 AM   I spent a total of 55 Mi[place]utes in face to face in clinical consultation, greater than 50% of which was counseling/coordinating care for TIPS.

## 2021-08-14 ENCOUNTER — Inpatient Hospital Stay (HOSPITAL_COMMUNITY): Payer: Medicare Other

## 2021-08-14 ENCOUNTER — Encounter: Payer: Self-pay | Admitting: Internal Medicine

## 2021-08-14 ENCOUNTER — Other Ambulatory Visit: Payer: Self-pay

## 2021-08-14 ENCOUNTER — Encounter (HOSPITAL_COMMUNITY): Payer: Self-pay | Admitting: Radiology

## 2021-08-14 DIAGNOSIS — R188 Other ascites: Secondary | ICD-10-CM

## 2021-08-14 DIAGNOSIS — J9601 Acute respiratory failure with hypoxia: Secondary | ICD-10-CM | POA: Diagnosis not present

## 2021-08-14 DIAGNOSIS — K920 Hematemesis: Secondary | ICD-10-CM

## 2021-08-14 DIAGNOSIS — Z515 Encounter for palliative care: Secondary | ICD-10-CM

## 2021-08-14 DIAGNOSIS — K7460 Unspecified cirrhosis of liver: Secondary | ICD-10-CM

## 2021-08-14 DIAGNOSIS — R778 Other specified abnormalities of plasma proteins: Secondary | ICD-10-CM | POA: Diagnosis not present

## 2021-08-14 DIAGNOSIS — Z7189 Other specified counseling: Secondary | ICD-10-CM

## 2021-08-14 LAB — COMPREHENSIVE METABOLIC PANEL
ALT: 53 U/L — ABNORMAL HIGH (ref 0–44)
AST: 118 U/L — ABNORMAL HIGH (ref 15–41)
Albumin: 3.7 g/dL (ref 3.5–5.0)
Alkaline Phosphatase: 58 U/L (ref 38–126)
Anion gap: 20 — ABNORMAL HIGH (ref 5–15)
BUN: 94 mg/dL — ABNORMAL HIGH (ref 8–23)
CO2: 17 mmol/L — ABNORMAL LOW (ref 22–32)
Calcium: 8.2 mg/dL — ABNORMAL LOW (ref 8.9–10.3)
Chloride: 107 mmol/L (ref 98–111)
Creatinine, Ser: 7.4 mg/dL — ABNORMAL HIGH (ref 0.44–1.00)
GFR, Estimated: 5 mL/min — ABNORMAL LOW (ref >60)
Glucose, Bld: 128 mg/dL — ABNORMAL HIGH (ref 70–99)
Potassium: 3.9 mmol/L (ref 3.5–5.1)
Sodium: 144 mmol/L (ref 135–145)
Total Bilirubin: 2 mg/dL — ABNORMAL HIGH (ref 0.3–1.2)
Total Protein: 5.8 g/dL — ABNORMAL LOW (ref 6.5–8.1)

## 2021-08-14 LAB — CBC
HCT: 25.5 % — ABNORMAL LOW (ref 36.0–46.0)
Hemoglobin: 9 g/dL — ABNORMAL LOW (ref 12.0–15.0)
MCH: 32 pg (ref 26.0–34.0)
MCHC: 35.3 g/dL (ref 30.0–36.0)
MCV: 90.7 fL (ref 80.0–100.0)
Platelets: 105 10*3/uL — ABNORMAL LOW (ref 150–400)
RBC: 2.81 MIL/uL — ABNORMAL LOW (ref 3.87–5.11)
RDW: 21 % — ABNORMAL HIGH (ref 11.5–15.5)
WBC: 22.5 10*3/uL — ABNORMAL HIGH (ref 4.0–10.5)
nRBC: 0.1 % (ref 0.0–0.2)

## 2021-08-14 LAB — GLUCOSE, CAPILLARY
Glucose-Capillary: 126 mg/dL — ABNORMAL HIGH (ref 70–99)
Glucose-Capillary: 143 mg/dL — ABNORMAL HIGH (ref 70–99)
Glucose-Capillary: 161 mg/dL — ABNORMAL HIGH (ref 70–99)
Glucose-Capillary: 175 mg/dL — ABNORMAL HIGH (ref 70–99)
Glucose-Capillary: 196 mg/dL — ABNORMAL HIGH (ref 70–99)
Glucose-Capillary: 170 mg/dL — ABNORMAL HIGH (ref 70–99)

## 2021-08-14 LAB — ECHOCARDIOGRAM LIMITED
Calc EF: 61.4 %
Height: 62 in
S' Lateral: 2.5 cm
Single Plane A2C EF: 66.2 %
Single Plane A4C EF: 58 %
Weight: 2783.09 oz

## 2021-08-14 LAB — TROPONIN I (HIGH SENSITIVITY)
Troponin I (High Sensitivity): 318 ng/L (ref <18)
Troponin I (High Sensitivity): 477 ng/L (ref <18)

## 2021-08-14 LAB — POCT I-STAT 7, (LYTES, BLD GAS, ICA,H+H)
Acid-base deficit: 6 mmol/L — ABNORMAL HIGH (ref 0.0–2.0)
Bicarbonate: 18.8 mmol/L — ABNORMAL LOW (ref 20.0–28.0)
Calcium, Ion: 1 mmol/L — ABNORMAL LOW (ref 1.15–1.40)
HCT: 31 % — ABNORMAL LOW (ref 36.0–46.0)
Hemoglobin: 10.5 g/dL — ABNORMAL LOW (ref 12.0–15.0)
O2 Saturation: 84 %
Patient temperature: 97.9
Potassium: 3.9 mmol/L (ref 3.5–5.1)
Sodium: 141 mmol/L (ref 135–145)
TCO2: 20 mmol/L — ABNORMAL LOW (ref 22–32)
pCO2 arterial: 34 mmHg (ref 32–48)
pH, Arterial: 7.35 (ref 7.35–7.45)
pO2, Arterial: 49 mmHg — ABNORMAL LOW (ref 83–108)

## 2021-08-14 LAB — PHOSPHORUS: Phosphorus: 5.7 mg/dL — ABNORMAL HIGH (ref 2.5–4.6)

## 2021-08-14 LAB — HEMOGLOBIN A1C
Hgb A1c MFr Bld: 5.7 % — ABNORMAL HIGH (ref 4.8–5.6)
Mean Plasma Glucose: 117 mg/dL

## 2021-08-14 LAB — MAGNESIUM: Magnesium: 1.6 mg/dL — ABNORMAL LOW (ref 1.7–2.4)

## 2021-08-14 MED ORDER — ALBUMIN HUMAN 25 % IV SOLN
INTRAVENOUS | Status: AC
  Filled 2021-08-14: qty 50

## 2021-08-14 MED ORDER — NOREPINEPHRINE 16 MG/250ML-% IV SOLN
0.0000 ug/min | INTRAVENOUS | Status: DC
  Administered 2021-08-14: 14 ug/min via INTRAVENOUS
  Administered 2021-08-14: 10 ug/min via INTRAVENOUS
  Administered 2021-08-14: 16 ug/min via INTRAVENOUS
  Administered 2021-08-15: 38 ug/min via INTRAVENOUS
  Filled 2021-08-14 (×2): qty 250

## 2021-08-14 MED ORDER — SITAGLIPTIN PHOSPHATE 25 MG PO TABS: ORAL_TABLET | ORAL | 3 refills | Status: AC

## 2021-08-14 MED ORDER — ASPIRIN 300 MG RE SUPP
150.0000 mg | Freq: Once | RECTAL | Status: DC
  Filled 2021-08-14: qty 1

## 2021-08-14 MED ORDER — HYDROMORPHONE HCL 1 MG/ML IJ SOLN
0.5000 mg | INTRAMUSCULAR | Status: DC | PRN
  Administered 2021-08-14 – 2021-08-15 (×2): 0.5 mg via INTRAVENOUS
  Filled 2021-08-14 (×2): qty 0.5

## 2021-08-14 MED ORDER — RIVAROXABAN 20 MG PO TABS: ORAL_TABLET | ORAL | 3 refills | Status: AC

## 2021-08-14 MED ORDER — DAPAGLIFLOZIN PROPANEDIOL 10 MG PO TABS: ORAL_TABLET | ORAL | 3 refills | Status: AC

## 2021-08-14 MED ORDER — RALOXIFENE HCL 60 MG PO TABS
ORAL_TABLET | ORAL | 3 refills | Status: AC
  Filled 2021-08-14: qty 120, 120d supply, fill #0

## 2021-08-14 MED ORDER — MORPHINE SULFATE (PF) 2 MG/ML IV SOLN
4.0000 mg | Freq: Once | INTRAVENOUS | Status: AC
  Administered 2021-08-14: 4 mg via INTRAVENOUS
  Filled 2021-08-14: qty 2

## 2021-08-14 MED ORDER — ORAL CARE MOUTH RINSE
15.0000 mL | OROMUCOSAL | Status: DC
  Administered 2021-08-14 (×3): 15 mL via OROMUCOSAL

## 2021-08-14 MED ORDER — VASOPRESSIN 20 UNITS/100 ML INFUSION FOR SHOCK
0.0000 [IU]/min | INTRAVENOUS | Status: DC
  Administered 2021-08-14 – 2021-08-15 (×3): 0.03 [IU]/min via INTRAVENOUS
  Filled 2021-08-14 (×3): qty 100

## 2021-08-14 MED ORDER — IPRATROPIUM-ALBUTEROL 0.5-2.5 (3) MG/3ML IN SOLN
3.0000 mL | Freq: Four times a day (QID) | RESPIRATORY_TRACT | Status: DC
  Administered 2021-08-14 – 2021-08-15 (×5): 3 mL via RESPIRATORY_TRACT
  Filled 2021-08-14 (×5): qty 3

## 2021-08-14 MED ORDER — MAGNESIUM SULFATE 2 GM/50ML IV SOLN
2.0000 g | Freq: Once | INTRAVENOUS | Status: AC
Start: 2021-08-14 — End: 2021-08-14
  Administered 2021-08-14: 2 g via INTRAVENOUS
  Filled 2021-08-14: qty 50

## 2021-08-14 MED ORDER — ASPIRIN 81 MG PO CHEW
81.0000 mg | CHEWABLE_TABLET | Freq: Every day | ORAL | Status: DC
Start: 2021-08-14 — End: 2021-08-15
  Administered 2021-08-14: 81 mg via ORAL
  Filled 2021-08-14: qty 1

## 2021-08-14 MED ORDER — ORAL CARE MOUTH RINSE: 15.0000 mL | OROMUCOSAL | Status: DC | PRN

## 2021-08-14 MED ORDER — ALBUMIN HUMAN 25 % IV SOLN
25.0000 g | Freq: Four times a day (QID) | INTRAVENOUS | Status: AC
  Administered 2021-08-14 (×3): 25 g via INTRAVENOUS
  Filled 2021-08-14 (×3): qty 100

## 2021-08-14 MED ORDER — METOPROLOL TARTRATE 5 MG/5ML IV SOLN: 2.5000 mg | Freq: Four times a day (QID) | INTRAVENOUS | Status: DC

## 2021-08-14 MED ORDER — FUROSEMIDE 10 MG/ML IJ SOLN
80.0000 mg | Freq: Once | INTRAMUSCULAR | Status: AC
  Administered 2021-08-14: 80 mg via INTRAVENOUS
  Filled 2021-08-14: qty 8

## 2021-08-14 NOTE — Progress Notes (Signed)
Nephrology Follow-Up Consult note   Assessment/Recommendations: Erica Larsen is a/an 71 y.o. female with a past medical history significant for CKD 3b, cirrhosis 2/2 NASH, afib admitted for variceal bleeding and AKI.       Oliguric AKI on CKD 3b: Baseline creatinine around 1.6 now with creatinine up to 8.3.  Urine with no protein but does have some hematuria as well as pyuria.  Infection could be considered.  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  Now status post TI PS on 6/26 with mild improvement in her kidney function. -IR placed temporary dialysis catheter during procedure -Patient is in tenuous spot with rising pressors.  CCM to discuss with patient and family.  If they would like a trial of dialysis I think this is reasonable but will hold off for now -Continue to monitor daily Cr, Dose meds for GFR -Monitor Daily I/Os, Daily weight  -Maintain MAP>65 for optimal renal perfusion.  -Avoid nephrotoxic medications including NSAIDs -Use synthetic opioids (Fentanyl/Dilaudid) if needed -Can continue current medications for possible HRS   Acute hypoxic respiratory failure: Largely driven by aspiration.  Some pulmonary edema may be contributing.  Has received Lasix.  Can redose as needed.  Supplemental oxygen per primary team.  No intubation   Variceal bleeding: Status post banding.  Transfuse as needed   Mixed anion gap and non-anion gap metabolic acidosis: Likely secondary to AKI.  Oral bicarbonate   Hyperkalemia: Improved with Lokelma   Cirrhosis: TIPS on 6/26.  GI following   Shock: Likely multifactorial in nature.  Escalating pressor requirements.  Management per primary team.   Recommendations conveyed to primary service.    [name] [facility] 08/14/2021 9:49  AM  ___________________________________________________________  CC: variceal bleeding, aki  Interval History/Subjective: Patient underwent TIPS yesterday.  Overnight patient had worsening hypoxia.  Possibly related to aspiration.  Pulmonary edema may be playing some role.  Troponin was found to be mildly elevated.  Patient denies any chest pain this morning. Pressor requirement also increasing   Medications:  Current Facility-Administered Medications  Medication Dose Route Frequency Provider Last Rate Last Admin   0.9 %  sodium chloride infusion  250 mL Intravenous Continuous [name], MD   New Bag at 08/13/21 1453   albumin human 25 % solution 25 g  25 g Intravenous Q6H Lorin Glass, MD 60 mL/hr at 08/14/21 0800 Infusion Verify at 08/14/21 0800   aspirin chewable tablet 81 mg  81 mg Oral Daily Karl Ito, MD   81 mg at 08/14/21 0254   cefTRIAXone (ROCEPHIN) 2 g in sodium chloride 0.9 % 100 mL IVPB  2 g Intravenous Q24H [name], MD   Stopped at 08/14/21 0046   Chlorhexidine Gluconate Cloth 2 % PADS 6 each  6 each Topical Q0600 [name], MD   6 each at 08/12/21 2040   docusate sodium (COLACE) capsule 100 mg  100 mg Oral BID PRN [name], MD       insulin aspart (novoLOG) injection 0-9 Units  0-9 Units Subcutaneous Q4H Martina Sinner, MD   1 Units at 08/14/21 0746   ipratropium-albuterol (DUONEB) 0.5-2.5 (3) MG/3ML nebulizer solution 3 mL  3 mL Nebulization Q6H Charlotte Sanes, MD   3 mL at 08/14/21 0745   norepinephrine (LEVOPHED)  16 mg in premix infusion  0-40 mcg/min Intravenous Titrated Karl Ito, MD 15 mL/hr at 08/14/21 0800 16 mcg/min at 08/14/21 0800   octreotide (SANDOSTATIN) 500 mcg in sodium chloride 0.9 % 250 mL (2 mcg/mL) infusion  50 mcg/hr Intravenous Continuous [name], MD 25 mL/hr at 08/14/21 0800 50 mcg/hr at 08/14/21 0800   ondansetron (ZOFRAN) injection 4 mg  4 mg Intravenous Q6H PRN [name], MD   4 mg at 08/13/21 0759   Oral care mouth rinse  15 mL Mouth Rinse 4 times per day Lorin Glass, MD       Oral care mouth rinse  15 mL Mouth Rinse PRN Lorin Glass, MD       pantoprozole (PROTONIX) 80 mg /NS 100 mL infusion  8 mg/hr Intravenous Continuous [name], MD 10 mL/hr at 08/14/21 0928 8 mg/hr at 08/14/21 0928   polyethylene glycol (MIRALAX / GLYCOLAX) packet 17 g  17 g Oral Daily PRN [name], MD       sodium bicarbonate tablet 1,300 mg  1,300 mg Oral BID [name], MD   1,300 mg at 08/13/21 2204   vasopressin (PITRESSIN) 20 Units in sodium chloride 0.9 % 100 mL infusion-*FOR SHOCK*  0-0.03 Units/min Intravenous Continuous Lorin Glass, MD 9 mL/hr at 08/14/21 0800 0.03 Units/min at 08/14/21 0800      Review of Systems: 10 systems reviewed and negative except per interval history/subjective  Physical Exam: Vitals:   08/14/21 0746 08/14/21 0800  BP:    Pulse: 96 97  Resp: (!) 26 (!) 27  Temp:    SpO2: 100% 97%   Total I/O In: 129.1 [I.V.:87.3; IV Piggyback:41.9] Out: -   Intake/Output Summary (Last 24 hours) at 08/14/2021 0949 Last data filed at 08/14/2021 0800 Gross per 24 hour  Intake 1769.63 ml  Output 5572 ml  Net -3802.37 ml   Constitutional: Ill-appearing, lying in bed, no distress ENMT: ears and nose without scars or lesions, MMM CV: normal rate, no edema Respiratory: Clear anteriorly, mild crackles on the periphery, bilateral chest rise on BiPAP Gastrointestinal: soft, non-tender Skin: no visible lesions or rashes Psych: Awake and alert, mood and affect appropriate   Test Results I personally reviewed new and old clinical labs and radiology tests Lab Results  Component Value Date   NA 141 08/14/2021   K 3.9 08/14/2021   CL 107 08/14/2021   CO2 17 (L) 08/14/2021   BUN 94 (H) 08/14/2021   CREATININE 7.40 (H) 08/14/2021   CALCIUM 8.2 (L) 08/14/2021   ALBUMIN 3.7 08/14/2021   PHOS 5.7 (H) 08/14/2021     CBC Recent Labs  Lab 08-26-2021 0518 08-26-21 1008 08/26/2021 2357 08/13/21 1202 08/14/21 0326 08/14/21 0513  WBC 15.2*  --  15.4* 17.9* 22.5*  --   NEUTROABS 11.3*  --   --   --   --   --   HGB 11.1*   < > 9.4* 8.5* 9.0* 10.5*  HCT 31.6*   < > 26.6* 24.2* 25.5* 31.0*  MCV 89.8  --  92.4 93.4 90.7  --   PLT 100*  --  93* 94* 105*  --    < > = values in this interval not displayed.

## 2021-08-14 NOTE — Progress Notes (Signed)
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 TIPS with Dr Elby Showers yesterday On Bipap Troponin rising UOP is good Shock; multi organ failure Followed closely by CCM-- Dr Katrinka Blazing  Family agrees for transition to comfort if continues to deteriorate per Dr Katrinka Blazing note today  Allergies: Sulfa antibiotics and Latex  Medications: Prior to Admission medications   Not on File     Vital Signs: BP (!) 135/49   Pulse 95   Temp 97.8 F (36.6 C) (Oral)   Resp (!) 22   Wt 173 lb 15.1 oz (78.9 kg)   SpO2 99%   BMI 31.81 kg/m   Physical Exam Constitutional:      Comments: Family at bedsdie  Skin:    General: Skin is warm.     Comments: Rt IJ temp cath in place- not in use Bipap Able to hear me and communicate yes/no with nods Not in pain      Imaging: ECHOCARDIOGRAM LIMITED  Result Date: 08/14/2021    ECHOCARDIOGRAM LIMITED REPORT   Patient Name:   Erica Larsen Date of Exam: 08/14/2021 Medical Rec #:  [id number]      Height:       62.0 in Accession #:    [id number]     Weight:       173.9 lb Date of Birth:  Oct 09, 1950      BSA:          1.802 m Patient Age:    71 years       BP:           132/48 mmHg Patient Gender: F              HR:           98 bpm. Exam Location:  Inpatient Procedure: Limited Echo and Limited Color Doppler STAT ECHO Indications:    NSTEMI; elevated trop  History:        Patient has prior history of Echocardiogram examinations, most                 recent 08/12/2021. Arrythmias:Atrial Fibrillation.  Sonographer:    Rodrigo Ran RCS Referring Phys: [id number] Vinnie Level SMITH IMPRESSIONS  1. Left ventricular ejection fraction, by estimation, is 60 to 65%. The left  ventricle has normal function. The left ventricle has no regional wall motion abnormalities. There is the interventricular septum is flattened in systole and diastole, consistent with right ventricular pressure and volume overload.  2. Mcconnell's sign is present. Consider acute pulmonary embolism or other cause of acute cor pulmonale. Right ventricular systolic function is mildly reduced. The right ventricular size is mildly enlarged. There is moderately elevated pulmonary artery systolic pressure.  3. Left atrial size was mildly dilated.  4. No shunt seen on color Doppler. Consider saline contrast study, especially if hypoxemia is present.  5. Right atrial size was mildly dilated.  6. The mitral valve is normal in structure. No evidence of mitral valve regurgitation. No evidence of mitral stenosis.  7. Tricuspid valve regurgitation is mild to moderate.  8. The aortic valve is normal in structure. Aortic valve regurgitation is not visualized. No aortic stenosis is present.  9. The inferior vena cava  is dilated in size with <50% respiratory variability, suggesting right atrial pressure of 15 mmHg. Comparison(s): The right ventricular systolic function is worse and estimated systolic PA pressure is higher. FINDINGS  Left Ventricle: Left ventricular ejection fraction, by estimation, is 60 to 65%. The left ventricle has normal function. The left ventricle has no regional wall motion abnormalities. The left ventricular internal cavity size was normal in size. There is  no left ventricular hypertrophy. The interventricular septum is flattened in systole and diastole, consistent with right ventricular pressure and volume overload. Right Ventricle: Mcconnell's sign is present. Consider acute pulmonary embolism or other cause of acute cor pulmonale. The right ventricular size is mildly enlarged. No increase in right ventricular wall thickness. Right ventricular systolic function is mildly reduced. There is moderately  elevated pulmonary artery systolic pressure. The tricuspid regurgitant velocity is 3.03 m/s, and with an assumed right atrial pressure of 15 mmHg, the estimated right ventricular systolic pressure is 51.7 mmHg. Left Atrium: Left atrial size was mildly dilated. Right Atrium: Right atrial size was mildly dilated. Pericardium: There is no evidence of pericardial effusion. Mitral Valve: The mitral valve is normal in structure. No evidence of mitral valve stenosis. Tricuspid Valve: The tricuspid valve is normal in structure. Tricuspid valve regurgitation is mild to moderate. No evidence of tricuspid stenosis. Aortic Valve: The aortic valve is normal in structure. Aortic valve regurgitation is not visualized. No aortic stenosis is present. Pulmonic Valve: The pulmonic valve was normal in structure. No evidence of pulmonic stenosis. Aorta: The aortic root is normal in size and structure. Venous: The inferior vena cava is dilated in size with less than 50% respiratory variability, suggesting right atrial pressure of 15 mmHg. IAS/Shunts: The interatrial septum is aneurysmal. No atrial level shunt detected by color flow Doppler. Additional Comments: Mild ascites is present. LEFT VENTRICLE PLAX 2D LVIDd:         4.10 cm LVIDs:         2.50 cm LV PW:         0.90 cm LV IVS:        0.90 cm  LV Volumes (MOD) LV vol d, MOD A2C: 46.8 ml LV vol d, MOD A4C: 46.0 ml LV vol s, MOD A2C: 15.8 ml LV vol s, MOD A4C: 19.3 ml LV SV MOD A2C:     31.0 ml LV SV MOD A4C:     46.0 ml LV SV MOD BP:      28.7 ml RIGHT VENTRICLE             IVC RV Basal diam:  4.10 cm     IVC diam: 2.30 cm RV Mid diam:    3.10 cm RV S prime:     10.40 cm/s TAPSE (M-mode): 1.4 cm LEFT ATRIUM         Index       RIGHT ATRIUM           Index LA diam:    4.10 cm 2.28 cm/m  RA Area:     19.50 cm                                 RA Volume:   51.30 ml  28.48 ml/m  TRICUSPID VALVE TR Peak grad:   36.7 mmHg TR Vmax:        303.00 cm/s Rachelle Hora Croitoru MD Electronically  signed by Thurmon Fair MD Signature Date/Time: 08/14/2021/11:05:09 AM  Final    DG CHEST PORT 1 VIEW  Result Date: 08/13/2021 CLINICAL DATA:  Shortness of breath EXAM: PORTABLE CHEST 1 VIEW COMPARISON:  06/29/2020 FINDINGS: Right dialysis catheter in place with the tip at the cavoatrial junction. Patchy bilateral airspace disease. Heart is normal size. Trace bilateral effusions. No acute bony abnormality. IMPRESSION: Patchy bilateral airspace disease concerning for pneumonia. Trace bilateral effusions. Electronically Signed   By: Charlett Nose M.D.   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 EXAM: 1. Ultrasound-guided paracentesis 2. Ultrasound-guided access of the right internal jugular vein 3. Ultrasound-guided access of the right common femoral vein 4. Hepatic venogram 5. Intravascular ultrasound 6. Catheterization of the portal vein 7. Portal venous and central manometry 8. Portal venogram 9. Creation of a transhepatic portal vein to hepatic vein shunt 10. Temporary hemodialysis catheter placement MEDICATIONS: As antibiotic prophylaxis, Rocephin 1 gm IV was ordered pre-procedure and administered intravenously within one hour of incision. ANESTHESIA/SEDATION: General - as administered by the Anesthesia department CONTRAST:  Forty-five ML Omnipaque 300, intravenous FLUOROSCOPY TIME:  Fluoroscopy Time: 12 minutes, 6 seconds (77 mGy). COMPLICATIONS: None immediate. PROCEDURE: Informed written consent was obtained from the patient after a thorough discussion of the procedural risks, benefits and alternatives. All questions were addressed. Maximal Sterile Barrier Technique was utilized including caps, mask, sterile gowns, sterile gloves, sterile drape, hand hygiene and skin antiseptic. A  timeout was performed prior to the initiation of the procedure. The right lower quadrant was prepped and draped in standard fashion. Preprocedure ultrasound evaluation demonstrated large volume ascites. Under direct ultrasound visualization, a 19 gauge, 10 cm Yueh needle was advanced into the ascites fluid in the needle was removed. 4.5 L of translucent, straw-colored fluid removed during the course of the procedure. A preliminary ultrasound of the right groin was performed and demonstrates a patent right common femoral vein and greater saphenous vein. A permanent ultrasound image was recorded. Using a combination of fluoroscopy and ultrasound, an access site was determined. A small dermatotomy was made at the planned puncture site. Using ultrasound guidance, access into the right greater saphenous vein was obtained with visualization of needle entry into the vessel using a standard micropuncture technique. A wire was advanced into the IVC insert all fascial dilation performed. An 8 [place], 11 cm vascular sheath was placed into the external iliac vein. Through this access site, an 24 [place] ICE catheter was advanced with ease under fluoroscopic guidance to the level of the intrahepatic inferior vena cava. A preliminary ultrasound of the right neck was performed and demonstrates a patent internal jugular vein. A permanent ultrasound image was recorded. Using a combination of fluoroscopy and ultrasound, an access site was determined. A small dermatotomy was made at the planned puncture site. Using ultrasound guidance, access into the right internal jugular vein was obtained with visualization of needle entry into the vessel using a standard micropuncture technique. A wire was advanced into the IVC and serial fascial dilation performed. A 10 French tips sheath was placed into the internal jugular vein and advanced to the IVC. The jugular sheath was retracted into the right atrium and manometry was performed. A 5  French angled tip catheter was then directed into the middle hepatic vein. Hepatic venogram was performed. These images demonstrated patent hepatic vein with no  stenosis. The catheter was advanced to a wedge portion of the a patent vein over which the 12 French dry seal sheath was advanced into the middle hepatic vein. Using ICE ultrasound visualization the catheter as middle hepatic vein as well as the portal anatomy was defined. A planned exit site from the hepatic vein and puncture site from the portal vein was placed into a single sonographic plane. Under direct ultrasound visualization, the ScorpionX needle was advanced into the central right portal vein. Hand injection of contrast confirmed position within the portal system. A Glidewire Advantage was then advanced into the splenic vein. A 5 French marking pigtail catheter was then advanced over the wire into the main portal vein and wire removed. Portal venogram was performed which demonstrated a patent portal vein. Prominent esophageal varices with were visualized arising from the left gastric vein with hepatofugal flow. Portal manometry was then performed. The tract was then dilated to 8 mm with an 8 mm x 4 cm Athletis balloon. A 8-10 mm by 6 + 2 mm of Viatorr endograft was placed. No post deployment balloon molding was performed. After placement of the shunt, right atrial and portal pressures were repeated. Completion portal venogram demonstrates a patent TIPS endograft without significant gastroesophageal varices. The left gastric vein was cannulated with the MPA catheter and repeat venogram was performed. After TIPS placement, there was restoration of hepatopetal flow without significant opacification of the previously visualized esophageal varices. The catheters and sheath were removed and exchanged over a wire for a 16 cm, 12.5 [place] Trialysis catheter. The catheter tip was positioned in the right atrium. Manual compression was applied to the right  greater saphenous venous access sites until hemostasis was achieved. The patient was transferred to the PACU in stable condition. Pre-TIPS Mean Pressures (mmHg): Right atrium: 21 Portal vein: 28 Portosystemic gradient: 7 Post-TIPS Mean Pressures (mmHg): Right atrium:21 Portal vein: 15 Portosystemic gradient: -6 IMPRESSION: 1. Successful transjugular portosystemic shunt creation. Reversal of hepatofugal flow into the left gastric vein/esophageal varices after TIPS creation. 2. Portosystemic gradient of 7 mm Hg (absolute portal venous pressure 28 mm Hg) before shunt placement and -6 mm Hg (absolute portal venous pressure 15 mm Hg) after shunt placement. It is thought that the pre and post mean right atrial pressure of 21 mmHg is spurious, particularly within echocardiogram performed on the prior day with an estimated right atrial pressure of 3 mmHg. PLAN: Begin lactulose TID, titrate to 3-4 bowel movements per day.Obtain morning CBC, CMP, INR. Expect transient hyperbilirubinemia and transaminitis.Would recommend assessing response to hepatorenal syndrome prior to initiation of renal replacement therapy. Low threshold to begin HD if renal function worsening.IR will follow while inpatient. IR [facility] will arrange for short term post-discharge follow up. Marliss Coots, MD [facility] Radiology Electronically Signed   By: Marliss Coots M.D.   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 EXAM: 1. Ultrasound-guided paracentesis 2. Ultrasound-guided access of the right internal jugular vein 3. Ultrasound-guided access of the right common femoral vein 4. Hepatic venogram 5. Intravascular ultrasound 6. Catheterization of the  portal vein 7. Portal venous and central manometry 8. Portal venogram 9. Creation of a transhepatic portal vein to hepatic vein shunt 10. Temporary hemodialysis catheter placement MEDICATIONS: As  antibiotic prophylaxis, Rocephin 1 gm IV was ordered pre-procedure and administered intravenously within one hour of incision. ANESTHESIA/SEDATION: General - as administered by the Anesthesia department CONTRAST:  Forty-five ML Omnipaque 300, intravenous FLUOROSCOPY TIME:  Fluoroscopy Time: 12 minutes, 6 seconds (77 mGy). COMPLICATIONS: None immediate. PROCEDURE: Informed written consent was obtained from the patient after a thorough discussion of the procedural risks, benefits and alternatives. All questions were addressed. Maximal Sterile Barrier Technique was utilized including caps, mask, sterile gowns, sterile gloves, sterile drape, hand hygiene and skin antiseptic. A timeout was performed prior to the initiation of the procedure. The right lower quadrant was prepped and draped in standard fashion. Preprocedure ultrasound evaluation demonstrated large volume ascites. Under direct ultrasound visualization, a 19 gauge, 10 cm Yueh needle was advanced into the ascites fluid in the needle was removed. 4.5 L of translucent, straw-colored fluid removed during the course of the procedure. A preliminary ultrasound of the right groin was performed and demonstrates a patent right common femoral vein and greater saphenous vein. A permanent ultrasound image was recorded. Using a combination of fluoroscopy and ultrasound, an access site was determined. A small dermatotomy was made at the planned puncture site. Using ultrasound guidance, access into the right greater saphenous vein was obtained with visualization of needle entry into the vessel using a standard micropuncture technique. A wire was advanced into the IVC insert all fascial dilation performed. An 8 [place], 11 cm vascular sheath was placed into the external iliac vein.  Through this access site, an 17 [place] ICE catheter was advanced with ease under fluoroscopic guidance to the level of the intrahepatic inferior vena cava. A preliminary ultrasound of the right neck was performed and demonstrates a patent internal jugular vein. A permanent ultrasound image was recorded. Using a combination of fluoroscopy and ultrasound, an access site was determined. A small dermatotomy was made at the planned puncture site. Using ultrasound guidance, access into the right internal jugular vein was obtained with visualization of needle entry into the vessel using a standard micropuncture technique. A wire was advanced into the IVC and serial fascial dilation performed. A 10 French tips sheath was placed into the internal jugular vein and advanced to the IVC. The jugular sheath was retracted into the right atrium and manometry was performed. A 5 French angled tip catheter was then directed into the middle hepatic vein. Hepatic venogram was performed. These images demonstrated patent hepatic vein with no stenosis. The catheter was advanced to a wedge portion of the a patent vein over which the 12 French dry seal sheath was advanced into the middle hepatic vein. Using ICE ultrasound visualization the catheter as middle hepatic vein as well as the portal anatomy was defined. A planned exit site from the hepatic vein and puncture site from the portal vein was placed into a single sonographic plane. Under direct ultrasound visualization, the ScorpionX needle was advanced into the central right portal vein. Hand injection of contrast confirmed position within the portal system. A Glidewire Advantage was then advanced into the splenic vein. A 5 French marking pigtail catheter was then advanced over the wire into the main portal vein and wire removed. Portal venogram was performed which demonstrated a patent portal vein. Prominent esophageal varices with were visualized arising from the left gastric  vein with hepatofugal flow. Portal manometry was then performed. The tract was then dilated to 8 mm with an 8 mm x 4 cm Athletis balloon. A 8-10 mm by 6 + 2 mm of  Viatorr endograft was placed. No post deployment balloon molding was performed. After placement of the shunt, right atrial and portal pressures were repeated. Completion portal venogram demonstrates a patent TIPS endograft without significant gastroesophageal varices. The left gastric vein was cannulated with the MPA catheter and repeat venogram was performed. After TIPS placement, there was restoration of hepatopetal flow without significant opacification of the previously visualized esophageal varices. The catheters and sheath were removed and exchanged over a wire for a 16 cm, 12.5 [place] Trialysis catheter. The catheter tip was positioned in the right atrium. Manual compression was applied to the right greater saphenous venous access sites until hemostasis was achieved. The patient was transferred to the PACU in stable condition. Pre-TIPS Mean Pressures (mmHg): Right atrium: 21 Portal vein: 28 Portosystemic gradient: 7 Post-TIPS Mean Pressures (mmHg): Right atrium:21 Portal vein: 15 Portosystemic gradient: -6 IMPRESSION: 1. Successful transjugular portosystemic shunt creation. Reversal of hepatofugal flow into the left gastric vein/esophageal varices after TIPS creation. 2. Portosystemic gradient of 7 mm Hg (absolute portal venous pressure 28 mm Hg) before shunt placement and -6 mm Hg (absolute portal venous pressure 15 mm Hg) after shunt placement. It is thought that the pre and post mean right atrial pressure of 21 mmHg is spurious, particularly within echocardiogram performed on the prior day with an estimated right atrial pressure of 3 mmHg. PLAN: Begin lactulose TID, titrate to 3-4 bowel movements per day.Obtain morning CBC, CMP, INR. Expect transient hyperbilirubinemia and transaminitis.Would recommend assessing response to hepatorenal syndrome  prior to initiation of renal replacement therapy. Low threshold to begin HD if renal function worsening.IR will follow while inpatient. IR [facility] will arrange for short term post-discharge follow up. Marliss Coots, MD [facility] Radiology Electronically Signed   By: Marliss Coots M.D.   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 EXAM: 1. Ultrasound-guided paracentesis 2. Ultrasound-guided access of the right internal jugular vein 3. Ultrasound-guided access of the right common femoral vein 4. Hepatic venogram 5. Intravascular ultrasound 6. Catheterization of the portal vein 7. Portal venous and central manometry 8. Portal venogram 9. Creation of a transhepatic portal vein to hepatic vein shunt 10. Temporary hemodialysis catheter placement MEDICATIONS: As antibiotic prophylaxis, Rocephin 1 gm IV was ordered pre-procedure and administered intravenously within one hour of incision. ANESTHESIA/SEDATION: General - as administered by the Anesthesia department CONTRAST:  Forty-five ML Omnipaque 300, intravenous FLUOROSCOPY TIME:  Fluoroscopy Time: 12 minutes, 6 seconds (77 mGy). COMPLICATIONS: None immediate. PROCEDURE: Informed written consent was obtained from the patient after a thorough discussion of the procedural risks, benefits and alternatives. All questions were addressed. Maximal Sterile Barrier Technique was utilized including caps, mask, sterile gowns, sterile gloves, sterile drape, hand hygiene and skin antiseptic. A timeout was performed prior to the initiation of the procedure. The right lower quadrant was prepped and draped in standard fashion. Preprocedure ultrasound evaluation demonstrated large  volume ascites. Under direct ultrasound visualization, a 19 gauge, 10 cm Yueh needle was advanced into the ascites fluid in the needle was removed. 4.5 L of translucent, straw-colored fluid removed during the course of the procedure. A preliminary ultrasound of the right groin was performed and demonstrates a patent right common femoral vein and greater saphenous vein. A permanent ultrasound image  was recorded. Using a combination of fluoroscopy and ultrasound, an access site was determined. A small dermatotomy was made at the planned puncture site. Using ultrasound guidance, access into the right greater saphenous vein was obtained with visualization of needle entry into the vessel using a standard micropuncture technique. A wire was advanced into the IVC insert all fascial dilation performed. An 8 [place], 11 cm vascular sheath was placed into the external iliac vein. Through this access site, an 26 [place] ICE catheter was advanced with ease under fluoroscopic guidance to the level of the intrahepatic inferior vena cava. A preliminary ultrasound of the right neck was performed and demonstrates a patent internal jugular vein. A permanent ultrasound image was recorded. Using a combination of fluoroscopy and ultrasound, an access site was determined. A small dermatotomy was made at the planned puncture site. Using ultrasound guidance, access into the right internal jugular vein was obtained with visualization of needle entry into the vessel using a standard micropuncture technique. A wire was advanced into the IVC and serial fascial dilation performed. A 10 French tips sheath was placed into the internal jugular vein and advanced to the IVC. The jugular sheath was retracted into the right atrium and manometry was performed. A 5 French angled tip catheter was then directed into the middle hepatic vein. Hepatic venogram was performed. These images demonstrated patent hepatic vein with no stenosis. The catheter  was advanced to a wedge portion of the a patent vein over which the 12 French dry seal sheath was advanced into the middle hepatic vein. Using ICE ultrasound visualization the catheter as middle hepatic vein as well as the portal anatomy was defined. A planned exit site from the hepatic vein and puncture site from the portal vein was placed into a single sonographic plane. Under direct ultrasound visualization, the ScorpionX needle was advanced into the central right portal vein. Hand injection of contrast confirmed position within the portal system. A Glidewire Advantage was then advanced into the splenic vein. A 5 French marking pigtail catheter was then advanced over the wire into the main portal vein and wire removed. Portal venogram was performed which demonstrated a patent portal vein. Prominent esophageal varices with were visualized arising from the left gastric vein with hepatofugal flow. Portal manometry was then performed. The tract was then dilated to 8 mm with an 8 mm x 4 cm Athletis balloon. A 8-10 mm by 6 + 2 mm of Viatorr endograft was placed. No post deployment balloon molding was performed. After placement of the shunt, right atrial and portal pressures were repeated. Completion portal venogram demonstrates a patent TIPS endograft without significant gastroesophageal varices. The left gastric vein was cannulated with the MPA catheter and repeat venogram was performed. After TIPS placement, there was restoration of hepatopetal flow without significant opacification of the previously visualized esophageal varices. The catheters and sheath were removed and exchanged over a wire for a 16 cm, 12.5 [place] Trialysis catheter. The catheter tip was positioned in the right atrium. Manual compression was applied to the right greater saphenous venous access sites until hemostasis was achieved. The patient was transferred to the PACU in stable condition. Pre-TIPS Mean Pressures (mmHg): Right atrium: 21 Portal  vein: 28 Portosystemic gradient: 7 Post-TIPS Mean Pressures (mmHg): Right atrium:21 Portal vein: 15 Portosystemic gradient: -6 IMPRESSION: 1. Successful transjugular portosystemic shunt creation. Reversal of hepatofugal flow into the left gastric vein/esophageal varices after TIPS creation. 2. Portosystemic gradient of 7 mm Hg (absolute portal venous pressure 28 mm  Hg) before shunt placement and -6 mm Hg (absolute portal venous pressure 15 mm Hg) after shunt placement. It is thought that the pre and post mean right atrial pressure of 21 mmHg is spurious, particularly within echocardiogram performed on the prior day with an estimated right atrial pressure of 3 mmHg. PLAN: Begin lactulose TID, titrate to 3-4 bowel movements per day.Obtain morning CBC, CMP, INR. Expect transient hyperbilirubinemia and transaminitis.Would recommend assessing response to hepatorenal syndrome prior to initiation of renal replacement therapy. Low threshold to begin HD if renal function worsening.IR will follow while inpatient. IR [facility] will arrange for short term post-discharge follow up. Marliss Coots, MD [facility] Radiology Electronically Signed   By: Marliss Coots M.D.   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 EXAM: 1. Ultrasound-guided paracentesis 2. Ultrasound-guided access of the right internal jugular vein 3. Ultrasound-guided access of the right common femoral vein 4. Hepatic venogram 5. Intravascular ultrasound 6. Catheterization of the portal vein 7. Portal venous and central manometry 8. Portal venogram 9. Creation of a transhepatic portal vein to hepatic vein shunt 10. Temporary hemodialysis catheter  placement MEDICATIONS: As antibiotic prophylaxis, Rocephin 1 gm IV was ordered pre-procedure and administered intravenously within one hour of incision. ANESTHESIA/SEDATION: General - as administered by the Anesthesia department CONTRAST:  Forty-five ML Omnipaque 300, intravenous FLUOROSCOPY TIME:  Fluoroscopy Time: 12 minutes, 6 seconds (77 mGy). COMPLICATIONS: None immediate. PROCEDURE: Informed written consent was obtained from the patient after a thorough discussion of the procedural risks, benefits and alternatives. All questions were addressed. Maximal Sterile Barrier Technique was utilized including caps, mask, sterile gowns, sterile gloves, sterile drape, hand hygiene and skin antiseptic. A timeout was performed prior to the initiation of the procedure. The right lower quadrant was prepped and draped in standard fashion. Preprocedure ultrasound evaluation demonstrated large volume ascites. Under direct ultrasound visualization, a 19 gauge, 10 cm Yueh needle was advanced into the ascites fluid in the needle was removed. 4.5 L of translucent, straw-colored fluid removed during the course of the procedure. A preliminary ultrasound of the right groin was performed and demonstrates a patent right common femoral vein and greater saphenous vein. A permanent ultrasound image was recorded. Using a combination of fluoroscopy and ultrasound, an access site was determined. A small dermatotomy was made at the planned puncture site. Using ultrasound guidance, access into the right greater saphenous vein was obtained with visualization of needle entry into the vessel using a standard micropuncture technique. A wire was advanced into the IVC insert all fascial dilation performed. An 8 [place], 11 cm vascular sheath was placed into the external iliac vein. Through this access site, an 42 [place] ICE catheter was advanced with ease under fluoroscopic guidance to the level of the intrahepatic inferior vena cava. A  preliminary ultrasound of the right neck was performed and demonstrates a patent internal jugular vein. A permanent ultrasound image was recorded. Using a combination of fluoroscopy and ultrasound, an access site was determined. A small dermatotomy was made at the planned puncture site. Using ultrasound guidance, access into the right internal jugular vein was obtained with visualization of needle entry into the vessel using a standard micropuncture technique. A wire was advanced into the IVC and serial fascial dilation  performed. A 10 French tips sheath was placed into the internal jugular vein and advanced to the IVC. The jugular sheath was retracted into the right atrium and manometry was performed. A 5 French angled tip catheter was then directed into the middle hepatic vein. Hepatic venogram was performed. These images demonstrated patent hepatic vein with no stenosis. The catheter was advanced to a wedge portion of the a patent vein over which the 12 French dry seal sheath was advanced into the middle hepatic vein. Using ICE ultrasound visualization the catheter as middle hepatic vein as well as the portal anatomy was defined. A planned exit site from the hepatic vein and puncture site from the portal vein was placed into a single sonographic plane. Under direct ultrasound visualization, the ScorpionX needle was advanced into the central right portal vein. Hand injection of contrast confirmed position within the portal system. A Glidewire Advantage was then advanced into the splenic vein. A 5 French marking pigtail catheter was then advanced over the wire into the main portal vein and wire removed. Portal venogram was performed which demonstrated a patent portal vein. Prominent esophageal varices with were visualized arising from the left gastric vein with hepatofugal flow. Portal manometry was then performed. The tract was then dilated to 8 mm with an 8 mm x 4 cm Athletis balloon. A 8-10 mm by 6 + 2 mm of  Viatorr endograft was placed. No post deployment balloon molding was performed. After placement of the shunt, right atrial and portal pressures were repeated. Completion portal venogram demonstrates a patent TIPS endograft without significant gastroesophageal varices. The left gastric vein was cannulated with the MPA catheter and repeat venogram was performed. After TIPS placement, there was restoration of hepatopetal flow without significant opacification of the previously visualized esophageal varices. The catheters and sheath were removed and exchanged over a wire for a 16 cm, 12.5 [place] Trialysis catheter. The catheter tip was positioned in the right atrium. Manual compression was applied to the right greater saphenous venous access sites until hemostasis was achieved. The patient was transferred to the PACU in stable condition. Pre-TIPS Mean Pressures (mmHg): Right atrium: 21 Portal vein: 28 Portosystemic gradient: 7 Post-TIPS Mean Pressures (mmHg): Right atrium:21 Portal vein: 15 Portosystemic gradient: -6 IMPRESSION: 1. Successful transjugular portosystemic shunt creation. Reversal of hepatofugal flow into the left gastric vein/esophageal varices after TIPS creation. 2. Portosystemic gradient of 7 mm Hg (absolute portal venous pressure 28 mm Hg) before shunt placement and -6 mm Hg (absolute portal venous pressure 15 mm Hg) after shunt placement. It is thought that the pre and post mean right atrial pressure of 21 mmHg is spurious, particularly within echocardiogram performed on the prior day with an estimated right atrial pressure of 3 mmHg. PLAN: Begin lactulose TID, titrate to 3-4 bowel movements per day.Obtain morning CBC, CMP, INR. Expect transient hyperbilirubinemia and transaminitis.Would recommend assessing response to hepatorenal syndrome prior to initiation of renal replacement therapy. Low threshold to begin HD if renal function worsening.IR will follow while inpatient. IR Portal [facility] will  arrange for short term post-discharge follow up. Marliss Coots, MD [facility] Radiology Electronically Signed   By: Marliss Coots M.D.   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 EXAM: 1. Ultrasound-guided paracentesis 2. Ultrasound-guided access of  the right internal jugular vein 3. Ultrasound-guided access of the right common femoral vein 4. Hepatic venogram 5. Intravascular ultrasound 6. Catheterization of the portal vein 7. Portal venous and central manometry 8. Portal venogram 9. Creation of a transhepatic portal vein to hepatic vein shunt 10. Temporary hemodialysis catheter placement MEDICATIONS: As antibiotic prophylaxis, Rocephin 1 gm IV was ordered pre-procedure and administered intravenously within one hour of incision. ANESTHESIA/SEDATION: General - as administered by the Anesthesia department CONTRAST:  Forty-five ML Omnipaque 300, intravenous FLUOROSCOPY TIME:  Fluoroscopy Time: 12 minutes, 6 seconds (77 mGy). COMPLICATIONS: None immediate. PROCEDURE: Informed written consent was obtained from the patient after a thorough discussion of the procedural risks, benefits and alternatives. All questions were addressed. Maximal Sterile Barrier Technique was utilized including caps, mask, sterile gowns, sterile gloves, sterile drape, hand hygiene and skin antiseptic. A timeout was performed prior to the initiation of the procedure. The right lower quadrant was prepped and draped in standard fashion. Preprocedure ultrasound evaluation demonstrated large volume ascites. Under direct ultrasound visualization, a 19 gauge, 10 cm Yueh needle was advanced into the ascites fluid in the needle was removed. 4.5 L of translucent,  straw-colored fluid removed during the course of the procedure. A preliminary ultrasound of the right groin was performed and demonstrates a patent right common femoral vein and greater saphenous vein. A permanent ultrasound image was recorded. Using a combination of fluoroscopy and ultrasound, an access site was determined. A small dermatotomy was made at the planned puncture site. Using ultrasound guidance, access into the right greater saphenous vein was obtained with visualization of needle entry into the vessel using a standard micropuncture technique. A wire was advanced into the IVC insert all fascial dilation performed. An 8 [place], 11 cm vascular sheath was placed into the external iliac vein. Through this access site, an 79 [place] ICE catheter was advanced with ease under fluoroscopic guidance to the level of the intrahepatic inferior vena cava. A preliminary ultrasound of the right neck was performed and demonstrates a patent internal jugular vein. A permanent ultrasound image was recorded. Using a combination of fluoroscopy and ultrasound, an access site was determined. A small dermatotomy was made at the planned puncture site. Using ultrasound guidance, access into the right internal jugular vein was obtained with visualization of needle entry into the vessel using a standard micropuncture technique. A wire was advanced into the IVC and serial fascial dilation performed. A 10 French tips sheath was placed into the internal jugular vein and advanced to the IVC. The jugular sheath was retracted into the right atrium and manometry was performed. A 5 French angled tip catheter was then directed into the middle hepatic vein. Hepatic venogram was performed. These images demonstrated patent hepatic vein with no stenosis. The catheter was advanced to a wedge portion of the a patent vein over which the 12 French dry seal sheath was advanced into the middle hepatic vein. Using ICE ultrasound visualization  the catheter as middle hepatic vein as well as the portal anatomy was defined. A planned exit site from the hepatic vein and puncture site from the portal vein was placed into a single sonographic plane. Under direct ultrasound visualization, the ScorpionX needle was advanced into the central right portal vein. Hand injection of contrast confirmed position within the portal system. A Glidewire Advantage was then advanced into the splenic vein. A 5 French marking pigtail catheter was then advanced over the wire into the main portal vein and wire removed. Portal venogram was  performed which demonstrated a patent portal vein. Prominent esophageal varices with were visualized arising from the left gastric vein with hepatofugal flow. Portal manometry was then performed. The tract was then dilated to 8 mm with an 8 mm x 4 cm Athletis balloon. A 8-10 mm by 6 + 2 mm of Viatorr endograft was placed. No post deployment balloon molding was performed. After placement of the shunt, right atrial and portal pressures were repeated. Completion portal venogram demonstrates a patent TIPS endograft without significant gastroesophageal varices. The left gastric vein was cannulated with the MPA catheter and repeat venogram was performed. After TIPS placement, there was restoration of hepatopetal flow without significant opacification of the previously visualized esophageal varices. The catheters and sheath were removed and exchanged over a wire for a 16 cm, 12.5 [place] Trialysis catheter. The catheter tip was positioned in the right atrium. Manual compression was applied to the right greater saphenous venous access sites until hemostasis was achieved. The patient was transferred to the PACU in stable condition. Pre-TIPS Mean Pressures (mmHg): Right atrium: 21 Portal vein: 28 Portosystemic gradient: 7 Post-TIPS Mean Pressures (mmHg): Right atrium:21 Portal vein: 15 Portosystemic gradient: -6 IMPRESSION: 1. Successful transjugular  portosystemic shunt creation. Reversal of hepatofugal flow into the left gastric vein/esophageal varices after TIPS creation. 2. Portosystemic gradient of 7 mm Hg (absolute portal venous pressure 28 mm Hg) before shunt placement and -6 mm Hg (absolute portal venous pressure 15 mm Hg) after shunt placement. It is thought that the pre and post mean right atrial pressure of 21 mmHg is spurious, particularly within echocardiogram performed on the prior day with an estimated right atrial pressure of 3 mmHg. PLAN: Begin lactulose TID, titrate to 3-4 bowel movements per day.Obtain morning CBC, CMP, INR. Expect transient hyperbilirubinemia and transaminitis.Would recommend assessing response to hepatorenal syndrome prior to initiation of renal replacement therapy. Low threshold to begin HD if renal function worsening.IR will follow while inpatient. IR Portal [facility] will arrange for short term post-discharge follow up. Marliss Coots, MD [facility] Radiology Electronically Signed   By: Marliss Coots M.D.   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 EXAM: 1. Ultrasound-guided paracentesis 2. Ultrasound-guided access of the right internal jugular vein 3. Ultrasound-guided access of the right common femoral vein 4. Hepatic venogram 5. Intravascular ultrasound 6. Catheterization of the portal vein 7. Portal venous and central manometry 8. Portal venogram 9. Creation of a transhepatic portal vein to hepatic vein shunt 10. Temporary hemodialysis catheter placement MEDICATIONS: As antibiotic prophylaxis, Rocephin 1 gm IV was ordered pre-procedure and administered intravenously within one hour of incision. ANESTHESIA/SEDATION: General -  as administered by the Anesthesia department CONTRAST:  Forty-five ML Omnipaque 300, intravenous FLUOROSCOPY TIME:  Fluoroscopy Time: 12 minutes, 6 seconds (77 mGy). COMPLICATIONS: None immediate. PROCEDURE: Informed written consent was obtained from the patient after a thorough discussion of the procedural risks, benefits and alternatives. All questions were addressed. Maximal Sterile Barrier Technique was utilized including caps, mask, sterile gowns, sterile gloves, sterile drape, hand hygiene and skin antiseptic. A timeout was performed prior to the initiation of the procedure. The right lower quadrant was prepped and draped in standard fashion. Preprocedure ultrasound evaluation demonstrated large volume ascites. Under direct ultrasound visualization, a 19 gauge,  10 cm Yueh needle was advanced into the ascites fluid in the needle was removed. 4.5 L of translucent, straw-colored fluid removed during the course of the procedure. A preliminary ultrasound of the right groin was performed and demonstrates a patent right common femoral vein and greater saphenous vein. A permanent ultrasound image was recorded. Using a combination of fluoroscopy and ultrasound, an access site was determined. A small dermatotomy was made at the planned puncture site. Using ultrasound guidance, access into the right greater saphenous vein was obtained with visualization of needle entry into the vessel using a standard micropuncture technique. A wire was advanced into the IVC insert all fascial dilation performed. An 8 [place], 11 cm vascular sheath was placed into the external iliac vein. Through this access site, an 83 [place] ICE catheter was advanced with ease under fluoroscopic guidance to the level of the intrahepatic inferior vena cava. A preliminary ultrasound of the right neck was performed and demonstrates a patent internal jugular vein. A permanent ultrasound image was recorded. Using a combination of fluoroscopy and  ultrasound, an access site was determined. A small dermatotomy was made at the planned puncture site. Using ultrasound guidance, access into the right internal jugular vein was obtained with visualization of needle entry into the vessel using a standard micropuncture technique. A wire was advanced into the IVC and serial fascial dilation performed. A 10 French tips sheath was placed into the internal jugular vein and advanced to the IVC. The jugular sheath was retracted into the right atrium and manometry was performed. A 5 French angled tip catheter was then directed into the middle hepatic vein. Hepatic venogram was performed. These images demonstrated patent hepatic vein with no stenosis. The catheter was advanced to a wedge portion of the a patent vein over which the 12 French dry seal sheath was advanced into the middle hepatic vein. Using ICE ultrasound visualization the catheter as middle hepatic vein as well as the portal anatomy was defined. A planned exit site from the hepatic vein and puncture site from the portal vein was placed into a single sonographic plane. Under direct ultrasound visualization, the ScorpionX needle was advanced into the central right portal vein. Hand injection of contrast confirmed position within the portal system. A Glidewire Advantage was then advanced into the splenic vein. A 5 French marking pigtail catheter was then advanced over the wire into the main portal vein and wire removed. Portal venogram was performed which demonstrated a patent portal vein. Prominent esophageal varices with were visualized arising from the left gastric vein with hepatofugal flow. Portal manometry was then performed. The tract was then dilated to 8 mm with an 8 mm x 4 cm Athletis balloon. A 8-10 mm by 6 + 2 mm of Viatorr endograft was placed. No post deployment balloon molding was performed. After placement of the shunt, right atrial and portal pressures were repeated. Completion portal venogram  demonstrates a patent TIPS endograft without significant gastroesophageal varices. The left gastric vein was cannulated with the MPA catheter and repeat venogram was performed. After TIPS placement, there was restoration of hepatopetal flow without significant opacification of the previously visualized esophageal varices. The catheters and sheath were removed and exchanged over a wire for a 16 cm, 12.5 [place] Trialysis catheter. The catheter tip was positioned in the right atrium. Manual compression was applied to the right greater saphenous venous access sites until hemostasis was achieved. The patient was transferred to the PACU in stable condition. Pre-TIPS Mean Pressures (mmHg): Right  atrium: 21 Portal vein: 28 Portosystemic gradient: 7 Post-TIPS Mean Pressures (mmHg): Right atrium:21 Portal vein: 15 Portosystemic gradient: -6 IMPRESSION: 1. Successful transjugular portosystemic shunt creation. Reversal of hepatofugal flow into the left gastric vein/esophageal varices after TIPS creation. 2. Portosystemic gradient of 7 mm Hg (absolute portal venous pressure 28 mm Hg) before shunt placement and -6 mm Hg (absolute portal venous pressure 15 mm Hg) after shunt placement. It is thought that the pre and post mean right atrial pressure of 21 mmHg is spurious, particularly within echocardiogram performed on the prior day with an estimated right atrial pressure of 3 mmHg. PLAN: Begin lactulose TID, titrate to 3-4 bowel movements per day.Obtain morning CBC, CMP, INR. Expect transient hyperbilirubinemia and transaminitis.Would recommend assessing response to hepatorenal syndrome prior to initiation of renal replacement therapy. Low threshold to begin HD if renal function worsening.IR will follow while inpatient. IR Portal [facility] Clinic will arrange for short term post-discharge follow up. Marliss Coots, MD [facility] Radiology Electronically Signed   By: Marliss Coots M.D.    On: 2021/08/16 16:43   ECHOCARDIOGRAM COMPLETE  Result Date: 08/16/21    ECHOCARDIOGRAM REPORT   Patient Name:   Erica Larsen Date of Exam: 08/12/2021 Medical Rec #:  [id number]      Height:       62.0 in Accession #:    [id number]     Weight:       170.6 lb Date of Birth:  1950-10-09      BSA:          1.787 m Patient Age:    71 years       BP:           97/44 mmHg Patient Gender: F              HR:           80 bpm. Exam Location:  [facility] Procedure: 2D Echo Indications:     Murmur R01.1  History:         Patient has prior history of Echocardiogram examinations, most                  recent 08/08/2014.  Sonographer:     Overton Mam RDCS Referring Phys:  [id number] CARMEN L GONZALEZ Diagnosing Phys: Adrian [place] IMPRESSIONS  1. Left ventricular ejection fraction, by estimation, is 55 to 60%. The left ventricle has normal function. The left ventricle has no regional wall motion abnormalities. There is mild concentric left ventricular hypertrophy. Left ventricular diastolic parameters are consistent with Grade I diastolic dysfunction (impaired relaxation).  2. Right ventricular systolic function is normal. The right ventricular size is normal.  3. The mitral valve is normal in structure. Mild mitral valve regurgitation. No evidence of mitral stenosis.  4. Tricuspid valve regurgitation is mild to moderate.  5. The aortic valve is normal in structure. Aortic valve regurgitation is not visualized. No aortic stenosis is present.  6. The inferior vena cava is normal in size with greater than 50% respiratory variability, suggesting right atrial pressure of 3 mmHg. FINDINGS  Left Ventricle: Left ventricular ejection fraction, by estimation, is 55 to 60%. The left ventricle has normal function. The left ventricle has no regional wall motion abnormalities. The left ventricular internal cavity size was normal in size. There is  mild concentric left ventricular hypertrophy. Left ventricular diastolic parameters are consistent  with Grade I diastolic dysfunction (impaired relaxation). Right Ventricle: The right ventricular size is  normal. No increase in right ventricular wall thickness. Right ventricular systolic function is normal. Left Atrium: Left atrial size was normal in size. Right Atrium: Right atrial size was normal in size. Pericardium: There is no evidence of pericardial effusion. Mitral Valve: The mitral valve is normal in structure. Mild mitral valve regurgitation. No evidence of mitral valve stenosis. Tricuspid Valve: The tricuspid valve is normal in structure. Tricuspid valve regurgitation is mild to moderate. No evidence of tricuspid stenosis. Aortic Valve: The aortic valve is normal in structure. Aortic valve regurgitation is not visualized. No aortic stenosis is present. Aortic valve peak gradient measures 13.5 mmHg. Pulmonic Valve: The pulmonic valve was normal in structure. Pulmonic valve regurgitation is trivial. No evidence of pulmonic stenosis. Aorta: The aortic root is normal in size and structure. Venous: The inferior vena cava is normal in size with greater than 50% respiratory variability, suggesting right atrial pressure of 3 mmHg. IAS/Shunts: No atrial level shunt detected by color flow Doppler.  LEFT VENTRICLE PLAX 2D LVIDd:         4.64 cm     Diastology LVIDs:         3.02 cm     LV e' medial:    7.51 cm/s LV PW:         1.05 cm     LV E/e' medial:  13.2 LV IVS:        0.94 cm     LV e' lateral:   7.51 cm/s LVOT diam:     1.90 cm     LV E/e' lateral: 13.2 LV SV:         64 LV SV Index:   36 LVOT Area:     2.84 cm  LV Volumes (MOD) LV vol d, MOD A2C: 56.0 ml LV vol d, MOD A4C: 47.7 ml LV vol s, MOD A2C: 13.2 ml LV vol s, MOD A4C: 20.4 ml LV SV MOD A2C:     42.8 ml LV SV MOD A4C:     47.7 ml LV SV MOD BP:      35.3 ml RIGHT VENTRICLE RV Basal diam:  1.94 cm RV S prime:     12.20 cm/s TAPSE (M-mode): 2.0 cm LEFT ATRIUM             Index        RIGHT ATRIUM          Index LA diam:        4.70 cm 2.63 cm/m   RA  Area:     6.20 cm LA Vol (A2C):   72.5 ml 40.57 ml/m  RA Volume:   9.26 ml  5.18 ml/m LA Vol (A4C):   51.1 ml 28.60 ml/m LA Biplane Vol: 60.9 ml 34.08 ml/m  AORTIC VALVE                 PULMONIC VALVE AV Area (Vmax): 1.63 cm     PV Vmax:       1.64 m/s AV Vmax:        184.00 cm/s  PV Peak grad:  10.8 mmHg AV Peak Grad:   13.5 mmHg LVOT Vmax:      106.00 cm/s LVOT Vmean:     75.400 cm/s LVOT VTI:       0.226 m  AORTA Ao Root diam: 2.80 cm Ao Asc diam:  2.90 cm MITRAL VALVE               TRICUSPID VALVE MV Area (PHT): 3.40  cm    TV Peak grad:   40.3 mmHg MV Decel Time: 223 msec    TV Vmax:        3.17 m/s MV E velocity: 99.00 cm/s MV A velocity: 55.30 cm/s  SHUNTS MV E/A ratio:  1.79        Systemic VTI:  0.23 m                            Systemic Diam: 1.90 cm Adrian Blackwater Electronically signed by Adrian Blackwater Signature Date/Time: 2021/08/19/11:33:54 AM    Final    CT ABDOMEN PELVIS WO CONTRAST  Result Date: 08/12/2021 CLINICAL DATA:  Persistent nausea and vomiting. History of cirrhosis. EXAM: CT ABDOMEN AND PELVIS WITHOUT CONTRAST TECHNIQUE: Multidetector CT imaging of the abdomen and pelvis was performed following the standard protocol without IV contrast. RADIATION DOSE REDUCTION: This exam was performed according to the departmental dose-optimization program which includes automated exposure control, adjustment of the mA and/or kV according to patient size and/or use of iterative reconstruction technique. COMPARISON:  07/23/2021 FINDINGS: Lower chest: Small left pleural effusion. Wall thickening noted distal esophagus with small hiatal hernia. Hepatobiliary: Nodular liver contour compatible with cirrhosis. No suspicious focal abnormality in the liver on this study without intravenous contrast. There is no evidence for gallstones, gallbladder wall thickening, or pericholecystic fluid. No intrahepatic or extrahepatic biliary dilation. Pancreas: No focal mass lesion. No dilatation of the main duct. No  intraparenchymal cyst. No peripancreatic edema. Spleen: No splenomegaly. No focal mass lesion. Adrenals/Urinary Tract: No adrenal nodule or mass. Kidneys unremarkable. No evidence for hydroureter. Bladder is decompressed. Stomach/Bowel: Small hiatal hernia. Food and fluid noted in the stomach. Duodenum is normally positioned as is the ligament of Treitz. Probable hemostatic clips in the second segment of the duodenum. No small bowel wall thickening. No small bowel dilatation. The terminal ileum is normal. The appendix is not well visualized, but there is no edema or inflammation in the region of the cecum. No gross colonic mass. No colonic wall thickening. Vascular/Lymphatic: There is mild atherosclerotic calcification of the abdominal aorta without aneurysm. There is no gastrohepatic or hepatoduodenal ligament lymphadenopathy. No retroperitoneal or mesenteric lymphadenopathy. No pelvic sidewall lymphadenopathy. Reproductive: Unremarkable. Other: Large volume ascites evident. Musculoskeletal: No worrisome lytic or sclerotic osseous abnormality. IMPRESSION: 1. Nodular liver contour compatible with cirrhosis. 2. Large volume ascites, similar to prior. 3. Small left pleural effusion. 4. Wall thickening distal esophagus with small hiatal hernia. Esophagitis not excluded. 5. Aortic Atherosclerosis (ICD10-I70.0). Electronically Signed   By: Kennith Center M.D.   On: 08/12/2021 06:50    Labs:  CBC: Recent Labs    08/12/21 0518 08/12/21 1008 08/12/21 2357 08-19-21 1202 08/14/21 0326 08/14/21 0513  WBC 15.2*  --  15.4* 17.9* 22.5*  --   HGB 11.1*   < > 9.4* 8.5* 9.0* 10.5*  HCT 31.6*   < > 26.6* 24.2* 25.5* 31.0*  PLT 100*  --  93* 94* 105*  --    < > = values in this interval not displayed.    COAGS: Recent Labs    08/12/21 0518 08/12/21 1008 08/12/21 2357 2021-08-19 1202  INR 1.3*  --  1.2 1.3*  APTT  --  31  --   --     BMP: Recent Labs    08/12/21 2357 08/19/2021 1202 2021/08/19 2219  08/14/21 0326 08/14/21 0513  NA 135 137 139 144 141  K 5.0 5.9* 3.9 3.9  3.9  CL 101 104 105 107  --   CO2 12* 14* 19* 17*  --   GLUCOSE 181* 207* 145* 128*  --   BUN 96* 99* 96* 94*  --   CALCIUM 8.0* 7.9* 7.7* 8.2*  --   CREATININE 8.31* 7.95* 7.40* 7.40*  --   GFRNONAA 5* 5* 5* 5*  --     LIVER FUNCTION TESTS: Recent Labs    08/12/21 0518 08/12/21 2357 August 14, 2021 1202 08/14/21 0326  BILITOT 2.0* 2.5* 1.8* 2.0*  AST 71* 141* 121* 118*  ALT 36 57* 53* 53*  ALKPHOS 75 64 63 58  PROT 6.0* 6.1* 6.1* 5.8*  ALBUMIN 2.7* 3.2* 3.5 3.7    Assessment and Plan:  TIPS in IR 6/26 Temp cath in R IJ Not well Bipap; troponin rising Shock Decompensated NASH Cirrhosis CCM following closely IR will follow  Electronically Signed: Robet Leu, PA-C 08/14/2021, 2:39 PM   I spent a total of 15 Minutes at the the patient's bedside AND on the patient's hospital floor or unit, greater than 50% of which was counseling/coordinating care for TIPS

## 2021-08-14 NOTE — Progress Notes (Addendum)
eLink Physician-Brief Progress Note Patient Name: Erica Larsen DOB: 12-May-1950 MRN: [id number]   Date of Service  08/14/2021  HPI/Events of Note  Troponin #1 = 318. Unable to Heparinize d/t recent UGI bleed d/t varicies requiring banding and TIPS. Unable to use Metoprolol d/t Norepinephrine requirement. Cardiac echo done 08/12/2021 revealed no wall motion abnormalities.   eICU Interventions  Plan: ASA 81 mg PO now and Q day..  Continue to trend Troponin. Consider Cardiology consultation in AM.      Intervention Category Major Interventions: Other:  Erica Larsen 08/14/2021, 2:02 AM

## 2021-08-15 ENCOUNTER — Other Ambulatory Visit: Payer: Self-pay

## 2021-08-15 LAB — CULTURE, BLOOD (ROUTINE X 2): Special Requests: ADEQUATE

## 2021-08-15 LAB — GLUCOSE, CAPILLARY
Glucose-Capillary: 164 mg/dL — ABNORMAL HIGH (ref 70–99)
Glucose-Capillary: 136 mg/dL — ABNORMAL HIGH (ref 70–99)

## 2021-08-15 MED ORDER — ALBUMIN HUMAN 25 % IV SOLN
INTRAVENOUS | Status: AC
  Administered 2021-08-15: 25 g via INTRAVENOUS
  Filled 2021-08-15: qty 100

## 2021-08-15 MED ORDER — SODIUM BICARBONATE 8.4 % IV SOLN
INTRAVENOUS | Status: AC
  Filled 2021-08-15: qty 100

## 2021-08-15 MED ORDER — MIDAZOLAM HCL 2 MG/2ML IJ SOLN
0.2500 mg | Freq: Once | INTRAMUSCULAR | Status: AC
  Administered 2021-08-15: 0.25 mg via INTRAVENOUS
  Filled 2021-08-15: qty 2

## 2021-08-15 MED ORDER — SODIUM BICARBONATE 8.4 % IV SOLN
100.0000 meq | Freq: Once | INTRAVENOUS | Status: AC
  Administered 2021-08-15: 100 meq via INTRAVENOUS

## 2021-08-17 LAB — CULTURE, BLOOD (ROUTINE X 2)
Culture: NO GROWTH
Special Requests: ADEQUATE

## 2021-08-18 LAB — CULTURE, BLOOD (ROUTINE X 2)
Culture: NO GROWTH
Culture: NO GROWTH
Special Requests: ADEQUATE

## 2021-08-18 NOTE — Progress Notes (Signed)
eLink Physician-Brief Progress Note Patient Name: Erica Larsen DOB: 04/08/1950 MRN: [id number]   Date of Service  08-19-2021  HPI/Events of Note  Anxiety - Due to her illness and BiPAP. Patient is NPO and too weak to take PO meds. She really does not need a Precedex IV infusion. She is on BiPAP, therefore, we need to be extremely careful with IV benzodiazepines.   eICU Interventions  Plan: Versed 0.25 mg IV X 1.     Intervention Category Major Interventions: Delirium, psychosis, severe agitation - evaluation and management  Avani Sensabaugh Eugene August 19, 2021, 2:48 AM

## 2021-08-18 NOTE — Progress Notes (Signed)
Patients rhythm asystole on monitor at [phone]. Time of death confirmed by Antonieta Pert, RN and Loleta Dicker, RN. Family returned call and updated of patients status. Family currently on the way to bedside.   Antonieta Pert, RN

## 2021-08-18 NOTE — Progress Notes (Signed)
[facility] Progress Note Patient Name: Erica Larsen DOB: [birth year] MRN: [id number]   Date of Service  08/19/21  HPI/Events of Note  Hypotension - BP = 56/39 with MAP = 44. Patient is DNR with no escalation of care. Already on maximal vasopressor therapy.   eICU Interventions  Plan: NaHCO3 100 meq IV X 1 Nursing attempting to notify patient's family.      Intervention Category Major Interventions: Hypotension - evaluation and management  Jaynie Hitch Eugene 08-19-21, 6:14 AM

## 2021-08-18 NOTE — Death Summary Note (Signed)
DEATH SUMMARY   Patient Details  Name: CHARLET HARR MRN: [id number] DOB: 1950-12-19  Admission/Discharge Information   Admit Date:  Aug 28, 2021  Date of Death: Date of Death: August 31, 2021  Time of Death: Time of Death: 0642  Length of Stay: 3  Referring Physician: Venita Lick, NP   Reason(s) for Hospitalization  - Hemorrhagic shock secondary to variceal bleed - Acute renal failure combination HRS and ATN - Streptococcal bacteremia - Acute hypoxemic respiratory failure secondary to bilateral aspiration pneumonia - Decompensated NASH cirrhosis - Type II STEMI - Baseline diastolic heart failure  Brief Hospital Course (including significant findings, care, treatment, and services provided and events leading to death)  JENINA MOENING is a 71 yo F PMH Afib on xarelto, CKD IV, decompensated liver disease with cirrhosis and ascites (felt most likely due to NASH) who presented to [facility] ED with hematemesis beginning 08/29/2022 approx 0400, and blood in stool beginning 6/23. In ED, pt noted to be hypotensive. She was given Kcentra, started on octreotide and protonix gtts, and given IVF. Hgb was 11 and INR 1.3 -- didn't receive product. Labs also significant for acute renal failure. Admitted to PCCM at [facility], GI and nephro consulted.   Went for EGD at [facility] 08/29/22-- grade III esophageal varices-- banded as best as able, portal hypertensive gastropathy, what is described as massive a massive amount of blood. She was extubated post procedure.   It was recommended that patient transfer to [facility] for urgent TIPS.   GI and PCP have both recommended palliative care involvement for [place] discussions.  Patient underwent TIPS, paracentesis and prophylactic HD catheter placement. Unfortunately went into shock state and progressive hypoxemia thereafter.  CXR c/w aspiration pneumonia.  Echo showing acute RV dysfunction likely related to aspiration and post-TIPS.  Long family discussion held with plan to  transition to comfort if any deterioration.  Her hypotension progressed and she passed away early in the morning of 08-31-21.   Pertinent Labs and Studies  Significant Diagnostic Studies ECHOCARDIOGRAM LIMITED  Result Date: 08/14/2021    ECHOCARDIOGRAM LIMITED REPORT   Patient Name:   AASHA DINA Date of Exam: 08/14/2021 Medical Rec #:  [id number]      Height:       62.0 in Accession #:    [id number]     Weight:       173.9 lb Date of Birth:  [birth year]      BSA:          1.802 m Patient Age:    71 years       BP:           132/48 mmHg Patient Gender: F              HR:           98 bpm. Exam Location:  Inpatient Procedure: Limited Echo and Limited Color Doppler STAT ECHO Indications:    NSTEMI; elevated trop  History:        Patient has prior history of Echocardiogram examinations, most                 recent 2021-08-28. Arrythmias:Atrial Fibrillation.  Sonographer:    Joette Catching RCS Referring Phys: [id number] [place]  1. Left ventricular ejection fraction, by estimation, is 60 to 65%. The left ventricle has normal function. The left ventricle has no regional wall motion abnormalities. There is the interventricular septum is flattened in systole and diastole, consistent with right ventricular pressure  and volume overload.  2. Mcconnell's sign is present. Consider acute pulmonary embolism or other cause of acute cor pulmonale. Right ventricular systolic function is mildly reduced. The right ventricular size is mildly enlarged. There is moderately elevated pulmonary artery systolic pressure.  3. Left atrial size was mildly dilated.  4. No shunt seen on color Doppler. Consider saline contrast study, especially if hypoxemia is present.  5. Right atrial size was mildly dilated.  6. The mitral valve is normal in structure. No evidence of mitral valve regurgitation. No evidence of mitral stenosis.  7. Tricuspid valve regurgitation is mild to moderate.  8. The aortic valve is normal in structure.  Aortic valve regurgitation is not visualized. No aortic stenosis is present.  9. The inferior vena cava is dilated in size with <50% respiratory variability, suggesting right atrial pressure of 15 mmHg. Comparison(s): The right ventricular systolic function is worse and estimated systolic PA pressure is higher. FINDINGS  Left Ventricle: Left ventricular ejection fraction, by estimation, is 60 to 65%. The left ventricle has normal function. The left ventricle has no regional wall motion abnormalities. The left ventricular internal cavity size was normal in size. There is  no left ventricular hypertrophy. The interventricular septum is flattened in systole and diastole, consistent with right ventricular pressure and volume overload. Right Ventricle: Mcconnell's sign is present. Consider acute pulmonary embolism or other cause of acute cor pulmonale. The right ventricular size is mildly enlarged. No increase in right ventricular wall thickness. Right ventricular systolic function is mildly reduced. There is moderately elevated pulmonary artery systolic pressure. The tricuspid regurgitant velocity is 3.03 m/s, and with an assumed right atrial pressure of 15 mmHg, the estimated right ventricular systolic pressure is 77.4 mmHg. Left Atrium: Left atrial size was mildly dilated. Right Atrium: Right atrial size was mildly dilated. Pericardium: There is no evidence of pericardial effusion. Mitral Valve: The mitral valve is normal in structure. No evidence of mitral valve stenosis. Tricuspid Valve: The tricuspid valve is normal in structure. Tricuspid valve regurgitation is mild to moderate. No evidence of tricuspid stenosis. Aortic Valve: The aortic valve is normal in structure. Aortic valve regurgitation is not visualized. No aortic stenosis is present. Pulmonic Valve: The pulmonic valve was normal in structure. No evidence of pulmonic stenosis. Aorta: The aortic root is normal in size and structure. Venous: The inferior  vena cava is dilated in size with less than 50% respiratory variability, suggesting right atrial pressure of 15 mmHg. IAS/Shunts: The interatrial septum is aneurysmal. No atrial level shunt detected by color flow Doppler. Additional Comments: Mild ascites is present. LEFT VENTRICLE PLAX 2D LVIDd:         4.10 cm LVIDs:         2.50 cm LV PW:         0.90 cm LV IVS:        0.90 cm  LV Volumes (MOD) LV vol d, MOD A2C: 46.8 ml LV vol d, MOD A4C: 46.0 ml LV vol s, MOD A2C: 15.8 ml LV vol s, MOD A4C: 19.3 ml LV SV MOD A2C:     31.0 ml LV SV MOD A4C:     46.0 ml LV SV MOD BP:      28.7 ml RIGHT VENTRICLE             IVC RV Basal diam:  4.10 cm     IVC diam: 2.30 cm RV Mid diam:    3.10 cm RV S prime:  10.40 cm/s TAPSE (M-mode): 1.4 cm LEFT ATRIUM         Index       RIGHT ATRIUM           Index LA diam:    4.10 cm 2.28 cm/m  RA Area:     19.50 cm                                 RA Volume:   51.30 ml  28.48 ml/m  TRICUSPID VALVE TR Peak grad:   36.7 mmHg TR Vmax:        303.00 cm/s Dani Gobble Croitoru MD Electronically signed by Sanda Klein MD Signature Date/Time: 08/14/2021/11:05:09 AM    Final    DG CHEST PORT 1 VIEW  Result Date: 08/14/2021 CLINICAL DATA:  Shortness of breath EXAM: PORTABLE CHEST 1 VIEW COMPARISON:  06/29/2020 FINDINGS: Right dialysis catheter in place with the tip at the cavoatrial junction. Patchy bilateral airspace disease. Heart is normal size. Trace bilateral effusions. No acute bony abnormality. IMPRESSION: Patchy bilateral airspace disease concerning for pneumonia. Trace bilateral effusions. Electronically Signed   By: Rolm Baptise M.D.   On: 08/11/2021 19:09   IR Tips  Result Date: 08/02/2021 CLINICAL DATA:  71 year old female with Child Pugh B NASH cirrhosis (current MELD of 25 primarily due to presenting with hemorrhagic grade 3 esophageal varices status post unsuccessful endoscopic band ligation. EXAM: 1. Ultrasound-guided paracentesis 2. Ultrasound-guided access of the right  internal jugular vein 3. Ultrasound-guided access of the right common femoral vein 4. Hepatic venogram 5. Intravascular ultrasound 6. Catheterization of the portal vein 7. Portal venous and central manometry 8. Portal venogram 9. Creation of a transhepatic portal vein to hepatic vein shunt 10. Temporary hemodialysis catheter placement MEDICATIONS: As antibiotic prophylaxis, Rocephin 1 gm IV was ordered pre-procedure and administered intravenously within one hour of incision. ANESTHESIA/SEDATION: General - as administered by the Anesthesia department CONTRAST:  Forty-five ML Omnipaque 300, intravenous FLUOROSCOPY TIME:  Fluoroscopy Time: 12 minutes, 6 seconds (77 mGy). COMPLICATIONS: None immediate. PROCEDURE: Informed written consent was obtained from the patient after a thorough discussion of the procedural risks, benefits and alternatives. All questions were addressed. Maximal Sterile Barrier Technique was utilized including caps, mask, sterile gowns, sterile gloves, sterile drape, hand hygiene and skin antiseptic. A timeout was performed prior to the initiation of the procedure. The right lower quadrant was prepped and draped in standard fashion. Preprocedure ultrasound evaluation demonstrated large volume ascites. Under direct ultrasound visualization, a 19 gauge, 10 cm Yueh needle was advanced into the ascites fluid in the needle was removed. 4.5 L of translucent, straw-colored fluid removed during the course of the procedure. A preliminary ultrasound of the right groin was performed and demonstrates a patent right common femoral vein and greater saphenous vein. A permanent ultrasound image was recorded. Using a combination of fluoroscopy and ultrasound, an access site was determined. A small dermatotomy was made at the planned puncture site. Using ultrasound guidance, access into the right greater saphenous vein was obtained with visualization of needle entry into the vessel using a standard micropuncture  technique. A wire was advanced into the IVC insert all fascial dilation performed. An 8 [place], 11 cm vascular sheath was placed into the external iliac vein. Through this access site, an 78 [place] ICE catheter was advanced with ease under fluoroscopic guidance to the level of the intrahepatic inferior vena cava. A preliminary ultrasound of the  right neck was performed and demonstrates a patent internal jugular vein. A permanent ultrasound image was recorded. Using a combination of fluoroscopy and ultrasound, an access site was determined. A small dermatotomy was made at the planned puncture site. Using ultrasound guidance, access into the right internal jugular vein was obtained with visualization of needle entry into the vessel using a standard micropuncture technique. A wire was advanced into the IVC and serial fascial dilation performed. A 10 French tips sheath was placed into the internal jugular vein and advanced to the IVC. The jugular sheath was retracted into the right atrium and manometry was performed. A 5 French angled tip catheter was then directed into the middle hepatic vein. Hepatic venogram was performed. These images demonstrated patent hepatic vein with no stenosis. The catheter was advanced to a wedge portion of the a patent vein over which the 12 French dry seal sheath was advanced into the middle hepatic vein. Using ICE ultrasound visualization the catheter as middle hepatic vein as well as the portal anatomy was defined. A planned exit site from the hepatic vein and puncture site from the portal vein was placed into a single sonographic plane. Under direct ultrasound visualization, the ScorpionX needle was advanced into the central right portal vein. Hand injection of contrast confirmed position within the portal system. A Glidewire Advantage was then advanced into the splenic vein. A 5 French marking pigtail catheter was then advanced over the wire into the main portal vein and wire  removed. Portal venogram was performed which demonstrated a patent portal vein. Prominent esophageal varices with were visualized arising from the left gastric vein with hepatofugal flow. Portal manometry was then performed. The tract was then dilated to 8 mm with an 8 mm x 4 cm Athletis balloon. A 8-10 mm by 6 + 2 mm of Viatorr endograft was placed. No post deployment balloon molding was performed. After placement of the shunt, right atrial and portal pressures were repeated. Completion portal venogram demonstrates a patent TIPS endograft without significant gastroesophageal varices. The left gastric vein was cannulated with the MPA catheter and repeat venogram was performed. After TIPS placement, there was restoration of hepatopetal flow without significant opacification of the previously visualized esophageal varices. The catheters and sheath were removed and exchanged over a wire for a 16 cm, 12.5 [place] Trialysis catheter. The catheter tip was positioned in the right atrium. Manual compression was applied to the right greater saphenous venous access sites until hemostasis was achieved. The patient was transferred to the PACU in stable condition. Pre-TIPS Mean Pressures (mmHg): Right atrium: 21 Portal vein: 28 Portosystemic gradient: 7 Post-TIPS Mean Pressures (mmHg): Right atrium:21 Portal vein: 15 Portosystemic gradient: -6 IMPRESSION: 1. Successful transjugular portosystemic shunt creation. Reversal of hepatofugal flow into the left gastric vein/esophageal varices after TIPS creation. 2. Portosystemic gradient of 7 mm Hg (absolute portal venous pressure 28 mm Hg) before shunt placement and -6 mm Hg (absolute portal venous pressure 15 mm Hg) after shunt placement. It is thought that the pre and post mean right atrial pressure of 21 mmHg is spurious, particularly within echocardiogram performed on the prior day with an estimated right atrial pressure of 3 mmHg. PLAN: Begin lactulose TID, titrate to 3-4 bowel  movements per day.Obtain morning CBC, CMP, INR. Expect transient hyperbilirubinemia and transaminitis.Would recommend assessing response to hepatorenal syndrome prior to initiation of renal replacement therapy. Low threshold to begin HD if renal function worsening.IR will follow while inpatient. IR Portal [facility] will arrange for short  term post-discharge follow up. Ruthann Cancer, MD [facility] Radiology Electronically Signed   By: Ruthann Cancer M.D.   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 EXAM: 1. Ultrasound-guided paracentesis 2. Ultrasound-guided access of the right internal jugular vein 3. Ultrasound-guided access of the right common femoral vein 4. Hepatic venogram 5. Intravascular ultrasound 6. Catheterization of the portal vein 7. Portal venous and central manometry 8. Portal venogram 9. Creation of a transhepatic portal vein to hepatic vein shunt 10. Temporary hemodialysis catheter placement MEDICATIONS: As antibiotic prophylaxis, Rocephin 1 gm IV was ordered pre-procedure and administered intravenously within one hour of incision. ANESTHESIA/SEDATION: General - as administered by the Anesthesia department CONTRAST:  Forty-five ML Omnipaque 300, intravenous FLUOROSCOPY TIME:  Fluoroscopy Time: 12 minutes, 6 seconds (77 mGy). COMPLICATIONS: None immediate. PROCEDURE: Informed written consent was obtained from the patient after a thorough discussion of the procedural risks, benefits and alternatives. All questions were addressed. Maximal Sterile Barrier Technique was utilized including caps, mask, sterile gowns, sterile gloves, sterile drape, hand hygiene and skin antiseptic. A timeout was performed prior to  the initiation of the procedure. The right lower quadrant was prepped and draped in standard fashion. Preprocedure ultrasound evaluation demonstrated large volume ascites. Under direct ultrasound visualization, a 19 gauge, 10 cm Yueh needle was advanced into the ascites fluid in the needle was removed. 4.5 L of translucent, straw-colored fluid removed during the course of the procedure. A preliminary ultrasound of the right groin was performed and demonstrates a patent right common femoral vein and greater saphenous vein. A permanent ultrasound image was recorded. Using a combination of fluoroscopy and ultrasound, an access site was determined. A small dermatotomy was made at the planned puncture site. Using ultrasound guidance, access into the right greater saphenous vein was obtained with visualization of needle entry into the vessel using a standard micropuncture technique. A wire was advanced into the IVC insert all fascial dilation performed. An 8 [place], 11 cm vascular sheath was placed into the external iliac vein. Through this access site, an 75 [place] ICE catheter was advanced with ease under fluoroscopic guidance to the level of the intrahepatic inferior vena cava. A preliminary ultrasound of the right neck was performed and demonstrates a patent internal jugular vein. A permanent ultrasound image was recorded. Using a combination of fluoroscopy and ultrasound, an access site was determined. A small dermatotomy was made at the planned puncture site. Using ultrasound guidance, access into the right internal jugular vein was obtained with visualization of needle entry into the vessel using a standard micropuncture technique. A wire was advanced into the IVC and serial fascial dilation performed. A 10 French tips sheath was placed into the internal jugular vein and advanced to the IVC. The jugular sheath was retracted into the right atrium and manometry was performed. A 5 French angled tip catheter was  then directed into the middle hepatic vein. Hepatic venogram was performed. These images demonstrated patent hepatic vein with no stenosis. The catheter was advanced to a wedge portion of the a patent vein over which the 12 French dry seal sheath was advanced into the middle hepatic vein. Using ICE ultrasound visualization the catheter as middle hepatic vein as well as the portal anatomy was defined. A  planned exit site from the hepatic vein and puncture site from the portal vein was placed into a single sonographic plane. Under direct ultrasound visualization, the ScorpionX needle was advanced into the central right portal vein. Hand injection of contrast confirmed position within the portal system. A Glidewire Advantage was then advanced into the splenic vein. A 5 French marking pigtail catheter was then advanced over the wire into the main portal vein and wire removed. Portal venogram was performed which demonstrated a patent portal vein. Prominent esophageal varices with were visualized arising from the left gastric vein with hepatofugal flow. Portal manometry was then performed. The tract was then dilated to 8 mm with an 8 mm x 4 cm Athletis balloon. A 8-10 mm by 6 + 2 mm of Viatorr endograft was placed. No post deployment balloon molding was performed. After placement of the shunt, right atrial and portal pressures were repeated. Completion portal venogram demonstrates a patent TIPS endograft without significant gastroesophageal varices. The left gastric vein was cannulated with the MPA catheter and repeat venogram was performed. After TIPS placement, there was restoration of hepatopetal flow without significant opacification of the previously visualized esophageal varices. The catheters and sheath were removed and exchanged over a wire for a 16 cm, 12.5 [place] Trialysis catheter. The catheter tip was positioned in the right atrium. Manual compression was applied to the right greater saphenous venous access  sites until hemostasis was achieved. The patient was transferred to the PACU in stable condition. Pre-TIPS Mean Pressures (mmHg): Right atrium: 21 Portal vein: 28 Portosystemic gradient: 7 Post-TIPS Mean Pressures (mmHg): Right atrium:21 Portal vein: 15 Portosystemic gradient: -6 IMPRESSION: 1. Successful transjugular portosystemic shunt creation. Reversal of hepatofugal flow into the left gastric vein/esophageal varices after TIPS creation. 2. Portosystemic gradient of 7 mm Hg (absolute portal venous pressure 28 mm Hg) before shunt placement and -6 mm Hg (absolute portal venous pressure 15 mm Hg) after shunt placement. It is thought that the pre and post mean right atrial pressure of 21 mmHg is spurious, particularly within echocardiogram performed on the prior day with an estimated right atrial pressure of 3 mmHg. PLAN: Begin lactulose TID, titrate to 3-4 bowel movements per day.Obtain morning CBC, CMP, INR. Expect transient hyperbilirubinemia and transaminitis.Would recommend assessing response to hepatorenal syndrome prior to initiation of renal replacement therapy. Low threshold to begin HD if renal function worsening.IR will follow while inpatient. IR Portal [facility] will arrange for short term post-discharge follow up. Ruthann Cancer, MD [facility] Radiology Electronically Signed   By: Ruthann Cancer M.D.   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 EXAM: 1. Ultrasound-guided paracentesis 2. Ultrasound-guided access of the right internal jugular vein 3. Ultrasound-guided access of the right common femoral vein 4. Hepatic venogram 5. Intravascular ultrasound 6. Catheterization of the portal vein 7. Portal venous and  central manometry 8. Portal venogram 9. Creation of a transhepatic portal vein to hepatic vein shunt 10. Temporary hemodialysis catheter placement MEDICATIONS: As antibiotic prophylaxis, Rocephin 1 gm IV was ordered pre-procedure and administered intravenously within one hour of incision. ANESTHESIA/SEDATION: General - as administered by the Anesthesia department CONTRAST:  Forty-five ML Omnipaque 300, intravenous FLUOROSCOPY TIME:  Fluoroscopy Time: 12 minutes, 6 seconds (77 mGy). COMPLICATIONS: None immediate. PROCEDURE:  Informed written consent was obtained from the patient after a thorough discussion of the procedural risks, benefits and alternatives. All questions were addressed. Maximal Sterile Barrier Technique was utilized including caps, mask, sterile gowns, sterile gloves, sterile drape, hand hygiene and skin antiseptic. A timeout was performed prior to the initiation of the procedure. The right lower quadrant was prepped and draped in standard fashion. Preprocedure ultrasound evaluation demonstrated large volume ascites. Under direct ultrasound visualization, a 19 gauge, 10 cm Yueh needle was advanced into the ascites fluid in the needle was removed. 4.5 L of translucent, straw-colored fluid removed during the course of the procedure. A preliminary ultrasound of the right groin was performed and demonstrates a patent right common femoral vein and greater saphenous vein. A permanent ultrasound image was recorded. Using a combination of fluoroscopy and ultrasound, an access site was determined. A small dermatotomy was made at the planned puncture site. Using ultrasound guidance, access into the right greater saphenous vein was obtained with visualization of needle entry into the vessel using a standard micropuncture technique. A wire was advanced into the IVC insert all fascial dilation performed. An 8 [place], 11 cm vascular sheath was placed into the external iliac vein. Through this access site, an 70  [place] ICE catheter was advanced with ease under fluoroscopic guidance to the level of the intrahepatic inferior vena cava. A preliminary ultrasound of the right neck was performed and demonstrates a patent internal jugular vein. A permanent ultrasound image was recorded. Using a combination of fluoroscopy and ultrasound, an access site was determined. A small dermatotomy was made at the planned puncture site. Using ultrasound guidance, access into the right internal jugular vein was obtained with visualization of needle entry into the vessel using a standard micropuncture technique. A wire was advanced into the IVC and serial fascial dilation performed. A 10 French tips sheath was placed into the internal jugular vein and advanced to the IVC. The jugular sheath was retracted into the right atrium and manometry was performed. A 5 French angled tip catheter was then directed into the middle hepatic vein. Hepatic venogram was performed. These images demonstrated patent hepatic vein with no stenosis. The catheter was advanced to a wedge portion of the a patent vein over which the 12 French dry seal sheath was advanced into the middle hepatic vein. Using ICE ultrasound visualization the catheter as middle hepatic vein as well as the portal anatomy was defined. A planned exit site from the hepatic vein and puncture site from the portal vein was placed into a single sonographic plane. Under direct ultrasound visualization, the ScorpionX needle was advanced into the central right portal vein. Hand injection of contrast confirmed position within the portal system. A Glidewire Advantage was then advanced into the splenic vein. A 5 French marking pigtail catheter was then advanced over the wire into the main portal vein and wire removed. Portal venogram was performed which demonstrated a patent portal vein. Prominent esophageal varices with were visualized arising from the left gastric vein with hepatofugal flow.  Portal manometry was then performed. The tract was then dilated to 8 mm with an 8 mm x 4 cm Athletis balloon. A 8-10 mm by 6 + 2 mm of Viatorr endograft was placed. No post deployment balloon molding was performed. After placement of the shunt, right atrial and portal pressures were repeated. Completion portal venogram demonstrates a patent TIPS endograft without significant gastroesophageal varices. The left gastric vein was cannulated with the MPA catheter and repeat venogram  was performed. After TIPS placement, there was restoration of hepatopetal flow without significant opacification of the previously visualized esophageal varices. The catheters and sheath were removed and exchanged over a wire for a 16 cm, 12.5 [place] Trialysis catheter. The catheter tip was positioned in the right atrium. Manual compression was applied to the right greater saphenous venous access sites until hemostasis was achieved. The patient was transferred to the PACU in stable condition. Pre-TIPS Mean Pressures (mmHg): Right atrium: 21 Portal vein: 28 Portosystemic gradient: 7 Post-TIPS Mean Pressures (mmHg): Right atrium:21 Portal vein: 15 Portosystemic gradient: -6 IMPRESSION: 1. Successful transjugular portosystemic shunt creation. Reversal of hepatofugal flow into the left gastric vein/esophageal varices after TIPS creation. 2. Portosystemic gradient of 7 mm Hg (absolute portal venous pressure 28 mm Hg) before shunt placement and -6 mm Hg (absolute portal venous pressure 15 mm Hg) after shunt placement. It is thought that the pre and post mean right atrial pressure of 21 mmHg is spurious, particularly within echocardiogram performed on the prior day with an estimated right atrial pressure of 3 mmHg. PLAN: Begin lactulose TID, titrate to 3-4 bowel movements per day.Obtain morning CBC, CMP, INR. Expect transient hyperbilirubinemia and transaminitis.Would recommend assessing response to hepatorenal syndrome prior to initiation of renal  replacement therapy. Low threshold to begin HD if renal function worsening.IR will follow while inpatient. [facility] will arrange for short term post-discharge follow up. Ruthann Cancer, MD [facility] Radiology Electronically Signed   By: Ruthann Cancer M.D.   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 EXAM: 1. Ultrasound-guided paracentesis 2. Ultrasound-guided access of the right internal jugular vein 3. Ultrasound-guided access of the right common femoral vein 4. Hepatic venogram 5. Intravascular ultrasound 6. Catheterization of the portal vein 7. Portal venous and central manometry 8. Portal venogram 9. Creation of a transhepatic portal vein to hepatic vein shunt 10. Temporary hemodialysis catheter placement MEDICATIONS: As antibiotic prophylaxis, Rocephin 1 gm IV was ordered pre-procedure and administered intravenously within one hour of incision. ANESTHESIA/SEDATION: General - as administered by the Anesthesia department CONTRAST:  Forty-five ML Omnipaque 300, intravenous FLUOROSCOPY TIME:  Fluoroscopy Time: 12 minutes, 6 seconds (77 mGy). COMPLICATIONS: None immediate. PROCEDURE: Informed written consent was obtained from the patient after a thorough discussion of the procedural risks, benefits and alternatives. All questions were addressed. Maximal Sterile Barrier Technique was utilized including caps, mask, sterile gowns, sterile gloves, sterile drape, hand hygiene and skin antiseptic. A timeout was performed prior to the initiation of the procedure. The right lower quadrant was prepped and draped in standard fashion. Preprocedure ultrasound evaluation demonstrated large volume ascites. Under  direct ultrasound visualization, a 19 gauge, 10 cm Yueh needle was advanced into the ascites fluid in the needle was removed. 4.5 L of translucent, straw-colored fluid removed during the course of the procedure. A preliminary ultrasound of the right groin was performed and demonstrates a patent right common femoral vein and greater saphenous vein. A permanent ultrasound image was recorded. Using a combination of fluoroscopy and ultrasound, an access site was determined. A small dermatotomy was made at the planned puncture site. Using ultrasound guidance, access into the right greater saphenous vein was obtained with visualization of needle entry into the vessel using a standard micropuncture technique.  A wire was advanced into the IVC insert all fascial dilation performed. An 8 [place], 11 cm vascular sheath was placed into the external iliac vein. Through this access site, an 24 [place] ICE catheter was advanced with ease under fluoroscopic guidance to the level of the intrahepatic inferior vena cava. A preliminary ultrasound of the right neck was performed and demonstrates a patent internal jugular vein. A permanent ultrasound image was recorded. Using a combination of fluoroscopy and ultrasound, an access site was determined. A small dermatotomy was made at the planned puncture site. Using ultrasound guidance, access into the right internal jugular vein was obtained with visualization of needle entry into the vessel using a standard micropuncture technique. A wire was advanced into the IVC and serial fascial dilation performed. A 10 French tips sheath was placed into the internal jugular vein and advanced to the IVC. The jugular sheath was retracted into the right atrium and manometry was performed. A 5 French angled tip catheter was then directed into the middle hepatic vein. Hepatic venogram was performed. These images demonstrated patent hepatic vein with no stenosis. The catheter was advanced to a wedge  portion of the a patent vein over which the 12 French dry seal sheath was advanced into the middle hepatic vein. Using ICE ultrasound visualization the catheter as middle hepatic vein as well as the portal anatomy was defined. A planned exit site from the hepatic vein and puncture site from the portal vein was placed into a single sonographic plane. Under direct ultrasound visualization, the ScorpionX needle was advanced into the central right portal vein. Hand injection of contrast confirmed position within the portal system. A Glidewire Advantage was then advanced into the splenic vein. A 5 French marking pigtail catheter was then advanced over the wire into the main portal vein and wire removed. Portal venogram was performed which demonstrated a patent portal vein. Prominent esophageal varices with were visualized arising from the left gastric vein with hepatofugal flow. Portal manometry was then performed. The tract was then dilated to 8 mm with an 8 mm x 4 cm Athletis balloon. A 8-10 mm by 6 + 2 mm of Viatorr endograft was placed. No post deployment balloon molding was performed. After placement of the shunt, right atrial and portal pressures were repeated. Completion portal venogram demonstrates a patent TIPS endograft without significant gastroesophageal varices. The left gastric vein was cannulated with the MPA catheter and repeat venogram was performed. After TIPS placement, there was restoration of hepatopetal flow without significant opacification of the previously visualized esophageal varices. The catheters and sheath were removed and exchanged over a wire for a 16 cm, 12.5 [place] Trialysis catheter. The catheter tip was positioned in the right atrium. Manual compression was applied to the right greater saphenous venous access sites until hemostasis was achieved. The patient was transferred to the PACU in stable condition. Pre-TIPS Mean Pressures (mmHg): Right atrium: 21 Portal vein: 28 Portosystemic  gradient: 7 Post-TIPS Mean Pressures (mmHg): Right atrium:21 Portal vein: 15 Portosystemic gradient: -6 IMPRESSION: 1. Successful transjugular portosystemic shunt creation. Reversal of hepatofugal flow into the left gastric vein/esophageal varices after TIPS creation. 2. Portosystemic gradient of 7 mm Hg (absolute portal venous pressure 28 mm Hg) before shunt placement and -6 mm Hg (absolute portal venous pressure 15 mm Hg) after shunt placement. It is thought that the pre and post mean right atrial pressure of 21 mmHg is spurious, particularly within echocardiogram performed on the prior day with an estimated right atrial pressure  of 3 mmHg. PLAN: Begin lactulose TID, titrate to 3-4 bowel movements per day.Obtain morning CBC, CMP, INR. Expect transient hyperbilirubinemia and transaminitis.Would recommend assessing response to hepatorenal syndrome prior to initiation of renal replacement therapy. Low threshold to begin HD if renal function worsening.IR will follow while inpatient. [facility] will arrange for short term post-discharge follow up. Ruthann Cancer, MD [facility] Radiology Electronically Signed   By: Ruthann Cancer M.D.   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 EXAM: 1. Ultrasound-guided paracentesis 2. Ultrasound-guided access of the right internal jugular vein 3. Ultrasound-guided access of the right common femoral vein 4. Hepatic venogram 5. Intravascular ultrasound 6. Catheterization of the portal vein 7. Portal venous and central manometry 8. Portal venogram 9. Creation of a transhepatic portal vein to hepatic vein shunt 10. Temporary hemodialysis catheter placement MEDICATIONS: As antibiotic prophylaxis,  Rocephin 1 gm IV was ordered pre-procedure and administered intravenously within one hour of incision. ANESTHESIA/SEDATION: General - as administered by the Anesthesia department CONTRAST:  Forty-five ML Omnipaque 300, intravenous FLUOROSCOPY TIME:  Fluoroscopy Time: 12 minutes, 6 seconds (77 mGy). COMPLICATIONS: None immediate. PROCEDURE: Informed written consent was obtained from the patient after a thorough discussion of the procedural risks, benefits and alternatives. All questions were addressed. Maximal Sterile Barrier Technique was utilized including caps, mask, sterile gowns, sterile gloves, sterile drape, hand hygiene and skin antiseptic. A timeout was performed prior to the initiation of the procedure. The right lower quadrant was prepped and draped in standard fashion. Preprocedure ultrasound evaluation demonstrated large volume ascites. Under direct ultrasound visualization, a 19 gauge, 10 cm Yueh needle was advanced into the ascites fluid in the needle was removed. 4.5 L of translucent, straw-colored fluid removed during the course of the procedure. A preliminary ultrasound of the right groin was performed and demonstrates a patent right common femoral vein and greater saphenous vein. A permanent ultrasound image was recorded. Using a combination of fluoroscopy and ultrasound, an access site was determined. A small dermatotomy was made at the planned puncture site. Using ultrasound guidance, access into the right greater saphenous vein was obtained with visualization of needle entry into the vessel using a standard micropuncture technique. A wire was advanced into the IVC insert all fascial dilation performed. An 8 [place], 11 cm vascular sheath was placed into the external iliac vein. Through this access site, an 39 [place] ICE catheter was advanced with ease under fluoroscopic guidance to the level of the intrahepatic inferior vena cava. A preliminary ultrasound of the right neck was performed  and demonstrates a patent internal jugular vein. A permanent ultrasound image was recorded. Using a combination of fluoroscopy and ultrasound, an access site was determined. A small dermatotomy was made at the planned puncture site. Using ultrasound guidance, access into the right internal jugular vein was obtained with visualization of needle entry into the vessel using a standard micropuncture technique. A wire was advanced into the IVC and serial fascial dilation performed. A 10 French tips sheath was placed into the internal jugular vein and advanced to the IVC. The jugular sheath was retracted into the right atrium and manometry was performed. A 5 French angled tip catheter was then directed into the middle hepatic vein. Hepatic venogram was performed. These images demonstrated  patent hepatic vein with no stenosis. The catheter was advanced to a wedge portion of the a patent vein over which the 12 French dry seal sheath was advanced into the middle hepatic vein. Using ICE ultrasound visualization the catheter as middle hepatic vein as well as the portal anatomy was defined. A planned exit site from the hepatic vein and puncture site from the portal vein was placed into a single sonographic plane. Under direct ultrasound visualization, the ScorpionX needle was advanced into the central right portal vein. Hand injection of contrast confirmed position within the portal system. A Glidewire Advantage was then advanced into the splenic vein. A 5 French marking pigtail catheter was then advanced over the wire into the main portal vein and wire removed. Portal venogram was performed which demonstrated a patent portal vein. Prominent esophageal varices with were visualized arising from the left gastric vein with hepatofugal flow. Portal manometry was then performed. The tract was then dilated to 8 mm with an 8 mm x 4 cm Athletis balloon. A 8-10 mm by 6 + 2 mm of Viatorr endograft was placed. No post deployment balloon  molding was performed. After placement of the shunt, right atrial and portal pressures were repeated. Completion portal venogram demonstrates a patent TIPS endograft without significant gastroesophageal varices. The left gastric vein was cannulated with the MPA catheter and repeat venogram was performed. After TIPS placement, there was restoration of hepatopetal flow without significant opacification of the previously visualized esophageal varices. The catheters and sheath were removed and exchanged over a wire for a 16 cm, 12.5 [place] Trialysis catheter. The catheter tip was positioned in the right atrium. Manual compression was applied to the right greater saphenous venous access sites until hemostasis was achieved. The patient was transferred to the PACU in stable condition. Pre-TIPS Mean Pressures (mmHg): Right atrium: 21 Portal vein: 28 Portosystemic gradient: 7 Post-TIPS Mean Pressures (mmHg): Right atrium:21 Portal vein: 15 Portosystemic gradient: -6 IMPRESSION: 1. Successful transjugular portosystemic shunt creation. Reversal of hepatofugal flow into the left gastric vein/esophageal varices after TIPS creation. 2. Portosystemic gradient of 7 mm Hg (absolute portal venous pressure 28 mm Hg) before shunt placement and -6 mm Hg (absolute portal venous pressure 15 mm Hg) after shunt placement. It is thought that the pre and post mean right atrial pressure of 21 mmHg is spurious, particularly within echocardiogram performed on the prior day with an estimated right atrial pressure of 3 mmHg. PLAN: Begin lactulose TID, titrate to 3-4 bowel movements per day.Obtain morning CBC, CMP, INR. Expect transient hyperbilirubinemia and transaminitis.Would recommend assessing response to hepatorenal syndrome prior to initiation of renal replacement therapy. Low threshold to begin HD if renal function worsening.IR will follow while inpatient. IR [facility] will arrange for short term post-discharge follow up. Ruthann Cancer, MD [facility] Radiology Electronically Signed   By: Ruthann Cancer M.D.   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 EXAM: 1. Ultrasound-guided paracentesis 2. Ultrasound-guided access of the right internal jugular vein 3. Ultrasound-guided access of the right common femoral vein 4. Hepatic venogram 5. Intravascular ultrasound 6. Catheterization of the portal vein 7. Portal venous and central manometry 8. Portal venogram 9. Creation of a transhepatic portal vein to hepatic vein shunt 10. Temporary  hemodialysis catheter placement MEDICATIONS: As antibiotic prophylaxis, Rocephin 1 gm IV was ordered pre-procedure and administered intravenously within one hour of incision. ANESTHESIA/SEDATION: General - as administered by the Anesthesia department CONTRAST:  Forty-five ML Omnipaque 300, intravenous FLUOROSCOPY TIME:  Fluoroscopy Time: 12 minutes, 6 seconds (77 mGy). COMPLICATIONS: None immediate. PROCEDURE: Informed written consent was obtained from the patient after a thorough discussion of the procedural risks, benefits and alternatives. All questions were addressed. Maximal Sterile Barrier Technique was utilized including caps, mask, sterile gowns, sterile gloves, sterile drape, hand hygiene and skin antiseptic. A timeout was performed prior to the initiation of the procedure. The right lower quadrant was prepped and draped in standard fashion. Preprocedure ultrasound evaluation demonstrated large volume ascites. Under direct ultrasound visualization, a 19 gauge, 10 cm Yueh needle was advanced into the ascites fluid in the needle was removed. 4.5 L of translucent, straw-colored fluid removed during the course of the  procedure. A preliminary ultrasound of the right groin was performed and demonstrates a patent right common femoral vein and greater saphenous vein. A permanent ultrasound image was recorded. Using a combination of fluoroscopy and ultrasound, an access site was determined. A small dermatotomy was made at the planned puncture site. Using ultrasound guidance, access into the right greater saphenous vein was obtained with visualization of needle entry into the vessel using a standard micropuncture technique. A wire was advanced into the IVC insert all fascial dilation performed. An 8 [place], 11 cm vascular sheath was placed into the external iliac vein. Through this access site, an 34 [place] ICE catheter was advanced with ease under fluoroscopic guidance to the level of the intrahepatic inferior vena cava. A preliminary ultrasound of the right neck was performed and demonstrates a patent internal jugular vein. A permanent ultrasound image was recorded. Using a combination of fluoroscopy and ultrasound, an access site was determined. A small dermatotomy was made at the planned puncture site. Using ultrasound guidance, access into the right internal jugular vein was obtained with visualization of needle entry into the vessel using a standard micropuncture technique. A wire was advanced into the IVC and serial fascial dilation performed. A 10 French tips sheath was placed into the internal jugular vein and advanced to the IVC. The jugular sheath was retracted into the right atrium and manometry was performed. A 5 French angled tip catheter was then directed into the middle hepatic vein. Hepatic venogram was performed. These images demonstrated patent hepatic vein with no stenosis. The catheter was advanced to a wedge portion of the a patent vein over which the 12 French dry seal sheath was advanced into the middle hepatic vein. Using ICE ultrasound visualization the catheter as middle hepatic vein as well as the  portal anatomy was defined. A planned exit site from the hepatic vein and puncture site from the portal vein was placed into a single sonographic plane. Under direct ultrasound visualization, the ScorpionX needle was advanced into the central right portal vein. Hand injection of contrast confirmed position within the portal system. A Glidewire Advantage was then advanced into the splenic vein. A 5 French marking pigtail catheter was then advanced over the wire into the main portal vein and wire removed. Portal venogram was performed which demonstrated a patent portal vein. Prominent esophageal varices with were visualized arising from the left gastric vein with hepatofugal flow. Portal manometry was then performed. The tract was then dilated to 8 mm with an 8 mm x 4 cm Athletis balloon. A 8-10 mm by  6 + 2 mm of Viatorr endograft was placed. No post deployment balloon molding was performed. After placement of the shunt, right atrial and portal pressures were repeated. Completion portal venogram demonstrates a patent TIPS endograft without significant gastroesophageal varices. The left gastric vein was cannulated with the MPA catheter and repeat venogram was performed. After TIPS placement, there was restoration of hepatopetal flow without significant opacification of the previously visualized esophageal varices. The catheters and sheath were removed and exchanged over a wire for a 16 cm, 12.5 [place] Trialysis catheter. The catheter tip was positioned in the right atrium. Manual compression was applied to the right greater saphenous venous access sites until hemostasis was achieved. The patient was transferred to the PACU in stable condition. Pre-TIPS Mean Pressures (mmHg): Right atrium: 21 Portal vein: 28 Portosystemic gradient: 7 Post-TIPS Mean Pressures (mmHg): Right atrium:21 Portal vein: 15 Portosystemic gradient: -6 IMPRESSION: 1. Successful transjugular portosystemic shunt creation. Reversal of hepatofugal flow  into the left gastric vein/esophageal varices after TIPS creation. 2. Portosystemic gradient of 7 mm Hg (absolute portal venous pressure 28 mm Hg) before shunt placement and -6 mm Hg (absolute portal venous pressure 15 mm Hg) after shunt placement. It is thought that the pre and post mean right atrial pressure of 21 mmHg is spurious, particularly within echocardiogram performed on the prior day with an estimated right atrial pressure of 3 mmHg. PLAN: Begin lactulose TID, titrate to 3-4 bowel movements per day.Obtain morning CBC, CMP, INR. Expect transient hyperbilirubinemia and transaminitis.Would recommend assessing response to hepatorenal syndrome prior to initiation of renal replacement therapy. Low threshold to begin HD if renal function worsening.IR will follow while inpatient. IR [facility] will arrange for short term post-discharge follow up. Ruthann Cancer, MD [facility] Radiology Electronically Signed   By: Ruthann Cancer M.D.   On: 07/22/2021 16:43   ECHOCARDIOGRAM COMPLETE  Result Date: 07/30/2021    ECHOCARDIOGRAM REPORT   Patient Name:   JENIPHER HAVEL Date of Exam: 08/09/2021 Medical Rec #:  [id number]      Height:       62.0 in Accession #:    [id number]     Weight:       170.6 lb Date of Birth:  [birth year]      BSA:          1.787 m Patient Age:    62 years       BP:           97/44 mmHg Patient Gender: F              HR:           80 bpm. Exam Location:  [facility] Procedure: 2D Echo Indications:     Murmur R01.1  History:         Patient has prior history of Echocardiogram examinations, most                  recent 08/08/2014.  Sonographer:     Kathlen Brunswick RDCS Referring Phys:  [id number] CARMEN L GONZALEZ Diagnosing Phys: Friend  1. Left ventricular ejection fraction, by estimation, is 55 to 60%. The left ventricle has normal function. The left ventricle has no regional wall motion abnormalities. There is mild concentric left  ventricular hypertrophy. Left ventricular diastolic parameters are consistent with Grade I diastolic dysfunction (impaired relaxation).  2. Right ventricular systolic function is normal. The right ventricular size is normal.  3. The mitral valve  is normal in structure. Mild mitral valve regurgitation. No evidence of mitral stenosis.  4. Tricuspid valve regurgitation is mild to moderate.  5. The aortic valve is normal in structure. Aortic valve regurgitation is not visualized. No aortic stenosis is present.  6. The inferior vena cava is normal in size with greater than 50% respiratory variability, suggesting right atrial pressure of 3 mmHg. FINDINGS  Left Ventricle: Left ventricular ejection fraction, by estimation, is 55 to 60%. The left ventricle has normal function. The left ventricle has no regional wall motion abnormalities. The left ventricular internal cavity size was normal in size. There is  mild concentric left ventricular hypertrophy. Left ventricular diastolic parameters are consistent with Grade I diastolic dysfunction (impaired relaxation). Right Ventricle: The right ventricular size is normal. No increase in right ventricular wall thickness. Right ventricular systolic function is normal. Left Atrium: Left atrial size was normal in size. Right Atrium: Right atrial size was normal in size. Pericardium: There is no evidence of pericardial effusion. Mitral Valve: The mitral valve is normal in structure. Mild mitral valve regurgitation. No evidence of mitral valve stenosis. Tricuspid Valve: The tricuspid valve is normal in structure. Tricuspid valve regurgitation is mild to moderate. No evidence of tricuspid stenosis. Aortic Valve: The aortic valve is normal in structure. Aortic valve regurgitation is not visualized. No aortic stenosis is present. Aortic valve peak gradient measures 13.5 mmHg. Pulmonic Valve: The pulmonic valve was normal in structure. Pulmonic valve regurgitation is trivial. No evidence of  pulmonic stenosis. Aorta: The aortic root is normal in size and structure. Venous: The inferior vena cava is normal in size with greater than 50% respiratory variability, suggesting right atrial pressure of 3 mmHg. IAS/Shunts: No atrial level shunt detected by color flow Doppler.  LEFT VENTRICLE PLAX 2D LVIDd:         4.64 cm     Diastology LVIDs:         3.02 cm     LV e' medial:    7.51 cm/s LV PW:         1.05 cm     LV E/e' medial:  13.2 LV IVS:        0.94 cm     LV e' lateral:   7.51 cm/s LVOT diam:     1.90 cm     LV E/e' lateral: 13.2 LV SV:         64 LV SV Index:   36 LVOT Area:     2.84 cm  LV Volumes (MOD) LV vol d, MOD A2C: 56.0 ml LV vol d, MOD A4C: 47.7 ml LV vol s, MOD A2C: 13.2 ml LV vol s, MOD A4C: 20.4 ml LV SV MOD A2C:     42.8 ml LV SV MOD A4C:     47.7 ml LV SV MOD BP:      35.3 ml RIGHT VENTRICLE RV Basal diam:  1.94 cm RV S prime:     12.20 cm/s TAPSE (M-mode): 2.0 cm LEFT ATRIUM             Index        RIGHT ATRIUM          Index LA diam:        4.70 cm 2.63 cm/m   RA Area:     6.20 cm LA Vol (A2C):   72.5 ml 40.57 ml/m  RA Volume:   9.26 ml  5.18 ml/m LA Vol (A4C):   51.1 ml 28.60 ml/m LA Biplane  Vol: 60.9 ml 34.08 ml/m  AORTIC VALVE                 PULMONIC VALVE AV Area (Vmax): 1.63 cm     PV Vmax:       1.64 m/s AV Vmax:        184.00 cm/s  PV Peak grad:  10.8 mmHg AV Peak Grad:   13.5 mmHg LVOT Vmax:      106.00 cm/s LVOT Vmean:     75.400 cm/s LVOT VTI:       0.226 m  AORTA Ao Root diam: 2.80 cm Ao Asc diam:  2.90 cm MITRAL VALVE               TRICUSPID VALVE MV Area (PHT): 3.40 cm    TV Peak grad:   40.3 mmHg MV Decel Time: 223 msec    TV Vmax:        3.17 m/s MV E velocity: 99.00 cm/s MV A velocity: 55.30 cm/s  SHUNTS MV E/A ratio:  1.79        Systemic VTI:  0.23 m                            Systemic Diam: 1.90 cm Neoma Laming Electronically signed by Neoma Laming Signature Date/Time: 07/30/2021/11:33:54 AM    Final    CT ABDOMEN PELVIS WO CONTRAST  Result Date:  08/06/2021 CLINICAL DATA:  Persistent nausea and vomiting. History of cirrhosis. EXAM: CT ABDOMEN AND PELVIS WITHOUT CONTRAST TECHNIQUE: Multidetector CT imaging of the abdomen and pelvis was performed following the standard protocol without IV contrast. RADIATION DOSE REDUCTION: This exam was performed according to the departmental dose-optimization program which includes automated exposure control, adjustment of the mA and/or kV according to patient size and/or use of iterative reconstruction technique. COMPARISON:  07/23/2021 FINDINGS: Lower chest: Small left pleural effusion. Wall thickening noted distal esophagus with small hiatal hernia. Hepatobiliary: Nodular liver contour compatible with cirrhosis. No suspicious focal abnormality in the liver on this study without intravenous contrast. There is no evidence for gallstones, gallbladder wall thickening, or pericholecystic fluid. No intrahepatic or extrahepatic biliary dilation. Pancreas: No focal mass lesion. No dilatation of the main duct. No intraparenchymal cyst. No peripancreatic edema. Spleen: No splenomegaly. No focal mass lesion. Adrenals/Urinary Tract: No adrenal nodule or mass. Kidneys unremarkable. No evidence for hydroureter. Bladder is decompressed. Stomach/Bowel: Small hiatal hernia. Food and fluid noted in the stomach. Duodenum is normally positioned as is the ligament of Treitz. Probable hemostatic clips in the second segment of the duodenum. No small bowel wall thickening. No small bowel dilatation. The terminal ileum is normal. The appendix is not well visualized, but there is no edema or inflammation in the region of the cecum. No gross colonic mass. No colonic wall thickening. Vascular/Lymphatic: There is mild atherosclerotic calcification of the abdominal aorta without aneurysm. There is no gastrohepatic or hepatoduodenal ligament lymphadenopathy. No retroperitoneal or mesenteric lymphadenopathy. No pelvic sidewall lymphadenopathy.  Reproductive: Unremarkable. Other: Large volume ascites evident. Musculoskeletal: No worrisome lytic or sclerotic osseous abnormality. IMPRESSION: 1. Nodular liver contour compatible with cirrhosis. 2. Large volume ascites, similar to prior. 3. Small left pleural effusion. 4. Wall thickening distal esophagus with small hiatal hernia. Esophagitis not excluded. 5. Aortic Atherosclerosis (ICD10-I70.0). Electronically Signed   By: Misty Stanley M.D.   On: 07/30/2021 06:50   US Paracentesis  Result Date: 07/26/2021 INDICATION: Suspected cirrhosis with ascites request received for diagnostic  and therapeutic paracentesis up to 5 L maximum. EXAM: ULTRASOUND GUIDED  PARACENTESIS MEDICATIONS: Local 1% lidocaine only. COMPLICATIONS: None immediate. PROCEDURE: Informed written consent was obtained from the patient after a discussion of the risks, benefits and alternatives to treatment. A timeout was performed prior to the initiation of the procedure. Initial ultrasound scanning demonstrates a large amount of ascites within the right upper abdominal quadrant. The right upper abdomen was prepped and draped in the usual sterile fashion. 1% lidocaine was used for local anesthesia. Following this, a 19 gauge, 7-cm, Yueh catheter was introduced. An ultrasound image was saved for documentation purposes. The paracentesis was performed. The catheter was removed and a dressing was applied. The patient tolerated the procedure well without immediate post procedural complication. Patient received post-procedure intravenous albumin; see nursing notes for details. FINDINGS: A total of approximately 5 L of clear yellow fluid was removed. Samples were sent to the laboratory as requested by the clinical team. IMPRESSION: Successful ultrasound-guided paracentesis yielding 5 liters of peritoneal fluid. This exam was performed by Tsosie Billing PA-C, and was supervised and interpreted by Dr. Maryelizabeth Kaufmann. PLAN: The patient has previously been formally  evaluated by the [facility] and is being actively followed for potential future intervention. Michaelle Birks, MD [facility] Electronically Signed   By: Michaelle Birks M.D.   On: 07/26/2021 16:59   CT ABDOMEN PELVIS WO CONTRAST  Result Date: 07/24/2021 CLINICAL DATA:  Generalized abdominal and bloating for 1 month, known cirrhosis liver ascites. EXAM: CT ABDOMEN AND PELVIS WITHOUT CONTRAST TECHNIQUE: Multidetector CT imaging of the abdomen and pelvis was performed following the standard protocol without IV contrast. RADIATION DOSE REDUCTION: This exam was performed according to the departmental dose-optimization program which includes automated exposure control, adjustment of the mA and/or kV according to patient size and/or use of iterative reconstruction technique. COMPARISON:  August 09, 2016 FINDINGS: Lower chest: Small left pleural effusion is identified. The visualized lung bases are otherwise unremarkable. The heart size is enlarged. Hepatobiliary: Nodular contour of the liver is identified without definite focal discrete liver lesion on this noncontrast exam. The gallbladder is normal. The biliary tree is normal. Pancreas: Unremarkable. No pancreatic ductal dilatation or surrounding inflammatory changes. Spleen: Normal in size without focal abnormality. Adrenals/Urinary Tract: Adrenal glands are unremarkable. Kidneys are normal, without renal calculi, focal lesion, or hydronephrosis. The bladder is decompressed, limiting evaluation. Stomach/Bowel: There is a moderate hiatal hernia. The stomach is otherwise partially decompressed without gross abnormality. There is decompressed colon with generalized bowel wall thickening of the colon probably secondary to adjacent ascites. Minimal prominent small bowel loops are identified in the lower abdomen. This is probably due to ileus. Diverticulosis of the colon  is identified. Vascular/Lymphatic: Aortic atherosclerosis. No enlarged abdominal or pelvic lymph nodes. Reproductive: Uterus and bilateral adnexa are unremarkable. Other: Marked ascites in the abdomen and pelvis. Musculoskeletal: Degenerative joint changes of the spine are noted. IMPRESSION: Cirrhosis of liver with marked ascites of abdomen and pelvis. Minimal prominent small bowel loops in the lower abdomen probably due to ileus. Decompressed colon with generalized bowel wall thickening of the colon probably secondary to adjacent ascites. Small left pleural effusion. Electronically Signed   By: Abelardo Diesel M.D.   On: 07/24/2021 12:03    Microbiology Recent Results (from the past 240 hour(s))  Blood Culture (routine x 2)     Status: None (Preliminary result)   Collection Time: 07/27/2021  7:12 AM   Specimen:  BLOOD RIGHT HAND  Result Value Ref Range Status   Specimen Description BLOOD RIGHT HAND  Final   Special Requests   Final    BOTTLES DRAWN AEROBIC AND ANAEROBIC Blood Culture adequate volume   Culture   Final    NO GROWTH 2 DAYS Performed at [facility], [address] [place], [place] [address]    Report Status PENDING  Incomplete  Blood Culture (routine x 2)     Status: Abnormal (Preliminary result)   Collection Time: 07/25/2021  7:12 AM   Specimen: BLOOD  Result Value Ref Range Status   Specimen Description   Final    BLOOD LEFT ANTECUBITAL Performed at [facility], [address], [place], [place] [address]    Special Requests   Final    BOTTLES DRAWN AEROBIC AND ANAEROBIC Blood Culture adequate volume Performed at [facility], [address] [place], [place] [address]    Culture  Setup Time   Final    GRAM POSITIVE COCCI AEROBIC BOTTLE ONLY Organism ID to follow GRAM STAIN REVIEWED-AGREE WITH RESULT CRITICAL RESULT CALLED TO, READ BACK BY AND VERIFIED WITHVioleta Gelinas PHARMD AT 2315 07/28/2021 GAA Performed at [facility], [address], [place], [place] [address]    Culture (A)  Final    STREPTOCOCCUS GALLOLYTICUS SUSCEPTIBILITIES TO FOLLOW Performed at [facility], [place] [address], [place], [place] [address]    Report Status PENDING  Incomplete  Blood Culture ID Panel (Reflexed)     Status: Abnormal   Collection Time: 08/01/2021  7:12 AM  Result Value Ref Range Status   Enterococcus faecalis NOT DETECTED NOT DETECTED Final   Enterococcus Faecium NOT DETECTED NOT DETECTED Final   Listeria monocytogenes NOT DETECTED NOT DETECTED Final   Staphylococcus species NOT DETECTED NOT DETECTED Final   Staphylococcus aureus (BCID) NOT DETECTED NOT DETECTED Final   Staphylococcus epidermidis NOT DETECTED NOT DETECTED Final   Staphylococcus lugdunensis NOT DETECTED NOT DETECTED Final   Streptococcus species DETECTED (A) NOT DETECTED Final    Comment: Not Enterococcus species, Streptococcus agalactiae, Streptococcus pyogenes, or Streptococcus pneumoniae. CRITICAL RESULT CALLED TO, READ BACK BY AND VERIFIED WITH: Violeta Gelinas PHARMD AT 2315 07/21/2021 GAA    Streptococcus agalactiae NOT DETECTED NOT DETECTED Final   Streptococcus pneumoniae NOT DETECTED NOT DETECTED Final   Streptococcus pyogenes NOT DETECTED NOT DETECTED Final   A.calcoaceticus-baumannii NOT DETECTED NOT DETECTED Final   Bacteroides fragilis NOT DETECTED NOT DETECTED Final   Enterobacterales NOT DETECTED NOT DETECTED Final   Enterobacter cloacae complex NOT DETECTED NOT DETECTED Final   Escherichia coli NOT DETECTED NOT DETECTED Final   Klebsiella aerogenes NOT DETECTED NOT DETECTED Final   Klebsiella oxytoca NOT DETECTED NOT DETECTED Final   Klebsiella pneumoniae NOT DETECTED NOT DETECTED Final   Proteus species NOT DETECTED NOT DETECTED Final   Salmonella species NOT DETECTED NOT DETECTED Final   Serratia marcescens NOT DETECTED NOT DETECTED Final   Haemophilus influenzae NOT DETECTED NOT DETECTED Final   Neisseria meningitidis NOT DETECTED  NOT DETECTED Final   Pseudomonas aeruginosa NOT DETECTED NOT DETECTED Final   Stenotrophomonas maltophilia NOT DETECTED NOT DETECTED Final   Candida albicans NOT DETECTED NOT DETECTED Final   Candida auris NOT DETECTED NOT DETECTED Final   Candida glabrata NOT DETECTED NOT DETECTED Final   Candida krusei NOT DETECTED NOT DETECTED Final   Candida parapsilosis NOT DETECTED NOT DETECTED Final   Candida tropicalis NOT DETECTED NOT DETECTED Final  Cryptococcus neoformans/gattii NOT DETECTED NOT DETECTED Final    Comment: Performed at [facility], [address] [place], [place] [address]  MRSA Next Gen by PCR, Nasal     Status: None   Collection Time: 07/21/2021  8:43 AM   Specimen: Nasal Mucosa; Nasal Swab  Result Value Ref Range Status   MRSA by PCR Next Gen NOT DETECTED NOT DETECTED Final    Comment: (NOTE) The GeneXpert MRSA Assay (FDA approved for NASAL specimens only), is one component of a comprehensive MRSA colonization surveillance program. It is not intended to diagnose MRSA infection nor to guide or monitor treatment for MRSA infections. Test performance is not FDA approved in patients less than 73 years old. Performed at [facility], [address] [place], [place] [address]   Culture, blood (Routine X 2) w Reflex to ID Panel     Status: None (Preliminary result)   Collection Time: 08/06/2021 12:02 PM   Specimen: BLOOD  Result Value Ref Range Status   Specimen Description BLOOD LEFT HAND  Final   Special Requests   Final    BOTTLES DRAWN AEROBIC ONLY Blood Culture results may not be optimal due to an inadequate volume of blood received in culture bottles   Culture   Final    NO GROWTH 2 DAYS Performed at [facility], [place] [address], [place], [place] [address]    Report Status PENDING  Incomplete  Culture, blood (Routine X 2) w Reflex to ID Panel     Status: None (Preliminary result)   Collection Time: 08/11/2021 12:02 PM   Specimen: BLOOD   Result Value Ref Range Status   Specimen Description BLOOD LEFT HAND  Final   Special Requests IN PEDIATRIC BOTTLE Blood Culture adequate volume  Final   Culture   Final    NO GROWTH 2 DAYS Performed at [facility], [place] [address] [place], [place] [address]    Report Status PENDING  Incomplete    Lab Basic Metabolic Panel: Recent Labs  Lab 08/16/2021 0518 08/02/2021 1008 08/08/2021 2357 08/06/2021 1202 08/03/2021 2219 08/14/21 0326 08/14/21 0513  NA 132*  --  135 137 139 144 141  K 4.4  --  5.0 5.9* 3.9 3.9 3.9  CL 102  --  101 104 105 107  --   CO2 14*  --  12* 14* 19* 17*  --   GLUCOSE 148*  --  181* 207* 145* 128*  --   BUN 103*  --  96* 99* 96* 94*  --   CREATININE 8.64*  --  8.31* 7.95* 7.40* 7.40*  --   CALCIUM 8.3*  --  8.0* 7.9* 7.7* 8.2*  --   MG  --  1.8 1.7  --   --  1.6*  --   PHOS  --   --  6.3*  --   --  5.7*  --    Liver Function Tests: Recent Labs  Lab 07/21/2021 0518 07/24/2021 2357 07/25/2021 1202 08/14/21 0326  AST 71* 141* 121* 118*  ALT 36 57* 53* 53*  ALKPHOS 75 64 63 58  BILITOT 2.0* 2.5* 1.8* 2.0*  PROT 6.0* 6.1* 6.1* 5.8*  ALBUMIN 2.7* 3.2* 3.5 3.7   Recent Labs  Lab 07/26/2021 0518  LIPASE 40   Recent Labs  Lab 08/03/2021 0607 07/20/2021 1202  AMMONIA 65* 68*   CBC: Recent Labs  Lab 08/07/2021 0518 07/30/2021 1008 08/01/2021 1642 07/31/2021 2357 07/30/2021 1202 08/14/21 0326 08/14/21  0513  WBC 15.2*  --   --  15.4* 17.9* 22.5*  --   NEUTROABS 11.3*  --   --   --   --   --   --   HGB 11.1*   < > 10.0* 9.4* 8.5* 9.0* 10.5*  HCT 31.6*   < > 29.0* 26.6* 24.2* 25.5* 31.0*  MCV 89.8  --   --  92.4 93.4 90.7  --   PLT 100*  --   --  93* 94* 105*  --    < > = values in this interval not displayed.   Cardiac Enzymes: No results for input(s): "CKTOTAL", "CKMB", "CKMBINDEX", "TROPONINI" in the last 168 hours. Sepsis Labs: Recent Labs  Lab 07/23/2021 0518 07/25/2021 0716 07/22/2021 1008 07/23/2021 1838 07/28/2021 2357 08/10/2021 1202 08/14/21 0326   WBC 15.2*  --   --   --  15.4* 17.9* 22.5*  LATICACIDVEN  --  4.0* 4.4* 2.4*  --   --   --     Procedures/Operations  08/16/2021 Procedure:  1) TIPS creation 2) Ultrasound guided paracentesis 3) Temporary hemodialysis catheter placement  EGD 6/25 - Grade III esophageal varices with stigmata of recent bleeding. Incompletely eradicated. Banded. - Clotted blood in the gastric fundus. - Portal hypertensive gastropathy. - Normal examined duodenum. - A large clot was seen in the fundus. The patient was sat up to move the clot but good visulalization could not be done. - No specimens collected.  Candee Furbish 2021-09-03, 7:11 AM

## 2021-08-18 NOTE — Progress Notes (Signed)
Messages left at all numbers listed in chart to advise family of change in status.  Will await return calls.

## 2021-08-18 NOTE — Progress Notes (Signed)
Multiple attempts made to primary contact Amy (daughter) to update on patients change of status.   Antonieta Pert, RN  14-Sep-2021 [phone]

## 2021-08-18 DEATH — deceased

## 2021-08-22 ENCOUNTER — Telehealth: Payer: Self-pay

## 2021-08-22 NOTE — Telephone Encounter (Signed)
Copied from [place]. Topic: General - Other >> Aug 22, 2021  1:11 PM Ja-Kwan M wrote: Reason for CRM: Lilia Pro with [employer] requests call back from [employer] regarding adverse effects of sitaGLIPtin (JANUVIA) 25 MG tablet. Cb# [phone]

## 2021-08-22 NOTE — Telephone Encounter (Signed)
Attempted to call and left general message to call back.

## 2021-08-24 NOTE — Telephone Encounter (Signed)
Attempt # 2 to call, no answer -- left general message and phone number to call provider back when available.

## 2021-10-17 ENCOUNTER — Ambulatory Visit: Payer: Medicare Other | Admitting: Gastroenterology

## 2021-10-24 ENCOUNTER — Ambulatory Visit: Payer: Medicare Other | Admitting: Gastroenterology

## 2021-11-05 ENCOUNTER — Ambulatory Visit: Payer: Medicare Other | Admitting: Nurse Practitioner

## 2021-11-16 ENCOUNTER — Other Ambulatory Visit: Payer: Self-pay | Admitting: Nurse Practitioner

## 2021-11-21 ENCOUNTER — Ambulatory Visit: Payer: Medicare Other | Admitting: Internal Medicine

## 2021-11-21 ENCOUNTER — Other Ambulatory Visit: Payer: Medicare Other

## 2021-11-21 ENCOUNTER — Ambulatory Visit: Payer: Medicare Other
